# Patient Record
Sex: Female | Born: 1935
Health system: Southern US, Community
[De-identification: ages and names within clinical notes are randomized; demographics above are authoritative.]

## PROBLEM LIST (undated history)

## (undated) DIAGNOSIS — Z8719 Personal history of other diseases of the digestive system: Secondary | ICD-10-CM

## (undated) DIAGNOSIS — F329 Major depressive disorder, single episode, unspecified: Secondary | ICD-10-CM

## (undated) DIAGNOSIS — M1990 Unspecified osteoarthritis, unspecified site: Secondary | ICD-10-CM

## (undated) DIAGNOSIS — R0600 Dyspnea, unspecified: Secondary | ICD-10-CM

## (undated) DIAGNOSIS — F32A Depression, unspecified: Secondary | ICD-10-CM

## (undated) DIAGNOSIS — K5792 Diverticulitis of intestine, part unspecified, without perforation or abscess without bleeding: Secondary | ICD-10-CM

## (undated) DIAGNOSIS — N289 Disorder of kidney and ureter, unspecified: Secondary | ICD-10-CM

## (undated) DIAGNOSIS — K219 Gastro-esophageal reflux disease without esophagitis: Secondary | ICD-10-CM

## (undated) DIAGNOSIS — J189 Pneumonia, unspecified organism: Secondary | ICD-10-CM

## (undated) DIAGNOSIS — C9030 Solitary plasmacytoma not having achieved remission: Secondary | ICD-10-CM

## (undated) DIAGNOSIS — I340 Nonrheumatic mitral (valve) insufficiency: Secondary | ICD-10-CM

## (undated) DIAGNOSIS — E785 Hyperlipidemia, unspecified: Secondary | ICD-10-CM

## (undated) DIAGNOSIS — I82409 Acute embolism and thrombosis of unspecified deep veins of unspecified lower extremity: Secondary | ICD-10-CM

## (undated) DIAGNOSIS — I10 Essential (primary) hypertension: Secondary | ICD-10-CM

## (undated) DIAGNOSIS — G709 Myoneural disorder, unspecified: Secondary | ICD-10-CM

## (undated) DIAGNOSIS — Z8489 Family history of other specified conditions: Secondary | ICD-10-CM

## (undated) HISTORY — PX: CERVICAL SPINE SURGERY: SHX589

## (undated) HISTORY — PX: ABDOMINAL SURGERY: SHX537

## (undated) HISTORY — PX: ABDOMINAL HYSTERECTOMY: SHX81

## (undated) HISTORY — PX: CATARACT EXTRACTION, BILATERAL: SHX1313

## (undated) HISTORY — PX: HERNIA REPAIR: SHX51

---

## 1972-12-23 HISTORY — PX: BREAST BIOPSY: SHX20

## 1988-12-23 HISTORY — PX: OTHER SURGICAL HISTORY: SHX169

## 2003-12-24 HISTORY — PX: OTHER SURGICAL HISTORY: SHX169

## 2006-09-11 ENCOUNTER — Ambulatory Visit: Payer: Self-pay | Admitting: Surgery

## 2010-06-15 ENCOUNTER — Ambulatory Visit: Payer: Self-pay | Admitting: Internal Medicine

## 2010-06-22 ENCOUNTER — Ambulatory Visit: Payer: Self-pay | Admitting: Internal Medicine

## 2010-06-27 ENCOUNTER — Ambulatory Visit: Payer: Self-pay | Admitting: Unknown Physician Specialty

## 2010-07-02 ENCOUNTER — Emergency Department: Payer: Self-pay | Admitting: Emergency Medicine

## 2010-07-04 ENCOUNTER — Ambulatory Visit: Payer: Self-pay | Admitting: Internal Medicine

## 2010-07-06 LAB — CEA: CEA: 1.4 ng/mL (ref 0.0–4.7)

## 2010-07-06 LAB — CA 125: CA 125: 58.2 U/mL — ABNORMAL HIGH (ref 0.0–34.0)

## 2010-07-23 ENCOUNTER — Ambulatory Visit: Payer: Self-pay | Admitting: Internal Medicine

## 2010-08-23 ENCOUNTER — Ambulatory Visit: Payer: Self-pay | Admitting: Internal Medicine

## 2010-09-03 LAB — CA 125: CA 125: 50.8 U/mL — ABNORMAL HIGH (ref 0.0–34.0)

## 2010-09-22 ENCOUNTER — Ambulatory Visit: Payer: Self-pay | Admitting: Internal Medicine

## 2010-10-23 ENCOUNTER — Ambulatory Visit: Payer: Self-pay | Admitting: Internal Medicine

## 2010-11-22 ENCOUNTER — Ambulatory Visit: Payer: Self-pay | Admitting: Internal Medicine

## 2010-11-28 ENCOUNTER — Ambulatory Visit: Payer: Self-pay | Admitting: Internal Medicine

## 2010-12-23 ENCOUNTER — Ambulatory Visit: Payer: Self-pay | Admitting: Internal Medicine

## 2011-01-23 ENCOUNTER — Ambulatory Visit: Payer: Self-pay | Admitting: Internal Medicine

## 2011-02-21 ENCOUNTER — Ambulatory Visit: Payer: Self-pay | Admitting: Internal Medicine

## 2011-04-02 ENCOUNTER — Ambulatory Visit: Payer: Self-pay | Admitting: Internal Medicine

## 2011-04-05 ENCOUNTER — Ambulatory Visit: Payer: Self-pay | Admitting: General Practice

## 2011-04-19 ENCOUNTER — Ambulatory Visit: Payer: Self-pay | Admitting: General Practice

## 2011-04-23 ENCOUNTER — Ambulatory Visit: Payer: Self-pay | Admitting: Internal Medicine

## 2011-06-14 ENCOUNTER — Ambulatory Visit: Payer: Self-pay | Admitting: General Practice

## 2011-06-17 LAB — PATHOLOGY REPORT

## 2012-01-02 DIAGNOSIS — I4891 Unspecified atrial fibrillation: Secondary | ICD-10-CM | POA: Diagnosis not present

## 2012-01-09 DIAGNOSIS — M6740 Ganglion, unspecified site: Secondary | ICD-10-CM | POA: Diagnosis not present

## 2012-01-09 DIAGNOSIS — M19049 Primary osteoarthritis, unspecified hand: Secondary | ICD-10-CM | POA: Diagnosis not present

## 2012-01-21 DIAGNOSIS — N6019 Diffuse cystic mastopathy of unspecified breast: Secondary | ICD-10-CM | POA: Diagnosis not present

## 2012-01-27 DIAGNOSIS — N63 Unspecified lump in unspecified breast: Secondary | ICD-10-CM | POA: Diagnosis not present

## 2012-01-27 DIAGNOSIS — N6489 Other specified disorders of breast: Secondary | ICD-10-CM | POA: Diagnosis not present

## 2012-01-30 DIAGNOSIS — I4891 Unspecified atrial fibrillation: Secondary | ICD-10-CM | POA: Diagnosis not present

## 2012-01-30 DIAGNOSIS — E782 Mixed hyperlipidemia: Secondary | ICD-10-CM | POA: Diagnosis not present

## 2012-02-04 DIAGNOSIS — L738 Other specified follicular disorders: Secondary | ICD-10-CM | POA: Diagnosis not present

## 2012-02-04 DIAGNOSIS — L821 Other seborrheic keratosis: Secondary | ICD-10-CM | POA: Diagnosis not present

## 2012-02-04 DIAGNOSIS — M7130 Other bursal cyst, unspecified site: Secondary | ICD-10-CM | POA: Diagnosis not present

## 2012-02-04 DIAGNOSIS — L570 Actinic keratosis: Secondary | ICD-10-CM | POA: Diagnosis not present

## 2012-02-17 DIAGNOSIS — I4891 Unspecified atrial fibrillation: Secondary | ICD-10-CM | POA: Diagnosis not present

## 2012-02-17 DIAGNOSIS — E782 Mixed hyperlipidemia: Secondary | ICD-10-CM | POA: Diagnosis not present

## 2012-03-10 DIAGNOSIS — M7130 Other bursal cyst, unspecified site: Secondary | ICD-10-CM | POA: Diagnosis not present

## 2012-03-10 DIAGNOSIS — L738 Other specified follicular disorders: Secondary | ICD-10-CM | POA: Diagnosis not present

## 2012-03-10 DIAGNOSIS — L280 Lichen simplex chronicus: Secondary | ICD-10-CM | POA: Diagnosis not present

## 2012-03-16 DIAGNOSIS — I4891 Unspecified atrial fibrillation: Secondary | ICD-10-CM | POA: Diagnosis not present

## 2012-04-13 DIAGNOSIS — I4891 Unspecified atrial fibrillation: Secondary | ICD-10-CM | POA: Diagnosis not present

## 2012-05-11 DIAGNOSIS — I4891 Unspecified atrial fibrillation: Secondary | ICD-10-CM | POA: Diagnosis not present

## 2012-05-27 DIAGNOSIS — C9030 Solitary plasmacytoma not having achieved remission: Secondary | ICD-10-CM | POA: Diagnosis not present

## 2012-05-27 DIAGNOSIS — D480 Neoplasm of uncertain behavior of bone and articular cartilage: Secondary | ICD-10-CM | POA: Diagnosis not present

## 2012-06-08 DIAGNOSIS — I4891 Unspecified atrial fibrillation: Secondary | ICD-10-CM | POA: Diagnosis not present

## 2012-06-16 DIAGNOSIS — I4891 Unspecified atrial fibrillation: Secondary | ICD-10-CM | POA: Diagnosis not present

## 2012-06-22 DIAGNOSIS — L819 Disorder of pigmentation, unspecified: Secondary | ICD-10-CM | POA: Diagnosis not present

## 2012-06-22 DIAGNOSIS — L280 Lichen simplex chronicus: Secondary | ICD-10-CM | POA: Diagnosis not present

## 2012-06-22 DIAGNOSIS — L578 Other skin changes due to chronic exposure to nonionizing radiation: Secondary | ICD-10-CM | POA: Diagnosis not present

## 2012-06-22 DIAGNOSIS — L570 Actinic keratosis: Secondary | ICD-10-CM | POA: Diagnosis not present

## 2012-06-22 DIAGNOSIS — L738 Other specified follicular disorders: Secondary | ICD-10-CM | POA: Diagnosis not present

## 2012-06-24 DIAGNOSIS — I4891 Unspecified atrial fibrillation: Secondary | ICD-10-CM | POA: Diagnosis not present

## 2012-07-02 DIAGNOSIS — I4891 Unspecified atrial fibrillation: Secondary | ICD-10-CM | POA: Diagnosis not present

## 2012-07-30 DIAGNOSIS — I4891 Unspecified atrial fibrillation: Secondary | ICD-10-CM | POA: Diagnosis not present

## 2012-08-04 DIAGNOSIS — Z961 Presence of intraocular lens: Secondary | ICD-10-CM | POA: Diagnosis not present

## 2012-08-04 DIAGNOSIS — H52229 Regular astigmatism, unspecified eye: Secondary | ICD-10-CM | POA: Diagnosis not present

## 2012-08-04 DIAGNOSIS — H5210 Myopia, unspecified eye: Secondary | ICD-10-CM | POA: Diagnosis not present

## 2012-08-04 DIAGNOSIS — H353190 Nonexudative age-related macular degeneration, unspecified eye, stage unspecified: Secondary | ICD-10-CM | POA: Diagnosis not present

## 2012-08-13 DIAGNOSIS — E785 Hyperlipidemia, unspecified: Secondary | ICD-10-CM | POA: Diagnosis not present

## 2012-08-13 DIAGNOSIS — I4891 Unspecified atrial fibrillation: Secondary | ICD-10-CM | POA: Diagnosis not present

## 2012-08-26 DIAGNOSIS — L905 Scar conditions and fibrosis of skin: Secondary | ICD-10-CM | POA: Diagnosis not present

## 2012-08-26 DIAGNOSIS — L819 Disorder of pigmentation, unspecified: Secondary | ICD-10-CM | POA: Diagnosis not present

## 2012-08-26 DIAGNOSIS — D485 Neoplasm of uncertain behavior of skin: Secondary | ICD-10-CM | POA: Diagnosis not present

## 2012-08-28 DIAGNOSIS — S8000XA Contusion of unspecified knee, initial encounter: Secondary | ICD-10-CM | POA: Diagnosis not present

## 2012-08-28 DIAGNOSIS — IMO0002 Reserved for concepts with insufficient information to code with codable children: Secondary | ICD-10-CM | POA: Diagnosis not present

## 2012-08-28 DIAGNOSIS — S8990XA Unspecified injury of unspecified lower leg, initial encounter: Secondary | ICD-10-CM | POA: Diagnosis not present

## 2012-08-28 DIAGNOSIS — M25569 Pain in unspecified knee: Secondary | ICD-10-CM | POA: Diagnosis not present

## 2012-09-08 DIAGNOSIS — S8010XA Contusion of unspecified lower leg, initial encounter: Secondary | ICD-10-CM | POA: Diagnosis not present

## 2012-09-08 DIAGNOSIS — S8000XA Contusion of unspecified knee, initial encounter: Secondary | ICD-10-CM | POA: Diagnosis not present

## 2012-09-08 DIAGNOSIS — S93419A Sprain of calcaneofibular ligament of unspecified ankle, initial encounter: Secondary | ICD-10-CM | POA: Diagnosis not present

## 2012-09-10 DIAGNOSIS — I4891 Unspecified atrial fibrillation: Secondary | ICD-10-CM | POA: Diagnosis not present

## 2012-09-29 DIAGNOSIS — S93419A Sprain of calcaneofibular ligament of unspecified ankle, initial encounter: Secondary | ICD-10-CM | POA: Diagnosis not present

## 2012-09-29 DIAGNOSIS — S8010XA Contusion of unspecified lower leg, initial encounter: Secondary | ICD-10-CM | POA: Diagnosis not present

## 2012-09-29 DIAGNOSIS — S8000XA Contusion of unspecified knee, initial encounter: Secondary | ICD-10-CM | POA: Diagnosis not present

## 2012-10-01 DIAGNOSIS — H43819 Vitreous degeneration, unspecified eye: Secondary | ICD-10-CM | POA: Diagnosis not present

## 2012-10-01 DIAGNOSIS — H52229 Regular astigmatism, unspecified eye: Secondary | ICD-10-CM | POA: Diagnosis not present

## 2012-10-01 DIAGNOSIS — H524 Presbyopia: Secondary | ICD-10-CM | POA: Diagnosis not present

## 2012-10-08 DIAGNOSIS — I4891 Unspecified atrial fibrillation: Secondary | ICD-10-CM | POA: Diagnosis not present

## 2012-10-08 DIAGNOSIS — E785 Hyperlipidemia, unspecified: Secondary | ICD-10-CM | POA: Diagnosis not present

## 2012-10-28 DIAGNOSIS — B351 Tinea unguium: Secondary | ICD-10-CM | POA: Diagnosis not present

## 2012-10-28 DIAGNOSIS — M79609 Pain in unspecified limb: Secondary | ICD-10-CM | POA: Diagnosis not present

## 2012-10-28 DIAGNOSIS — L851 Acquired keratosis [keratoderma] palmaris et plantaris: Secondary | ICD-10-CM | POA: Diagnosis not present

## 2012-11-03 DIAGNOSIS — S93419A Sprain of calcaneofibular ligament of unspecified ankle, initial encounter: Secondary | ICD-10-CM | POA: Diagnosis not present

## 2012-11-03 DIAGNOSIS — S8000XA Contusion of unspecified knee, initial encounter: Secondary | ICD-10-CM | POA: Diagnosis not present

## 2012-11-05 DIAGNOSIS — I4891 Unspecified atrial fibrillation: Secondary | ICD-10-CM | POA: Diagnosis not present

## 2012-12-03 DIAGNOSIS — I4891 Unspecified atrial fibrillation: Secondary | ICD-10-CM | POA: Diagnosis not present

## 2013-01-01 DIAGNOSIS — Z961 Presence of intraocular lens: Secondary | ICD-10-CM | POA: Diagnosis not present

## 2013-01-01 DIAGNOSIS — H5210 Myopia, unspecified eye: Secondary | ICD-10-CM | POA: Diagnosis not present

## 2013-01-01 DIAGNOSIS — H4300 Vitreous prolapse, unspecified eye: Secondary | ICD-10-CM | POA: Diagnosis not present

## 2013-01-01 DIAGNOSIS — H40009 Preglaucoma, unspecified, unspecified eye: Secondary | ICD-10-CM | POA: Diagnosis not present

## 2013-01-06 DIAGNOSIS — I4891 Unspecified atrial fibrillation: Secondary | ICD-10-CM | POA: Diagnosis not present

## 2013-01-06 DIAGNOSIS — E782 Mixed hyperlipidemia: Secondary | ICD-10-CM | POA: Diagnosis not present

## 2013-01-18 DIAGNOSIS — I4891 Unspecified atrial fibrillation: Secondary | ICD-10-CM | POA: Diagnosis not present

## 2013-02-09 DIAGNOSIS — K219 Gastro-esophageal reflux disease without esophagitis: Secondary | ICD-10-CM | POA: Diagnosis not present

## 2013-02-09 DIAGNOSIS — E559 Vitamin D deficiency, unspecified: Secondary | ICD-10-CM | POA: Diagnosis not present

## 2013-02-09 DIAGNOSIS — Z79899 Other long term (current) drug therapy: Secondary | ICD-10-CM | POA: Diagnosis not present

## 2013-02-09 DIAGNOSIS — C9030 Solitary plasmacytoma not having achieved remission: Secondary | ICD-10-CM | POA: Diagnosis not present

## 2013-02-09 DIAGNOSIS — R413 Other amnesia: Secondary | ICD-10-CM | POA: Diagnosis not present

## 2013-02-09 DIAGNOSIS — I749 Embolism and thrombosis of unspecified artery: Secondary | ICD-10-CM | POA: Diagnosis not present

## 2013-02-09 DIAGNOSIS — M159 Polyosteoarthritis, unspecified: Secondary | ICD-10-CM | POA: Diagnosis not present

## 2013-02-09 DIAGNOSIS — I10 Essential (primary) hypertension: Secondary | ICD-10-CM | POA: Diagnosis not present

## 2013-02-09 DIAGNOSIS — Z0000 Encounter for general adult medical examination without abnormal findings: Secondary | ICD-10-CM | POA: Diagnosis not present

## 2013-02-15 DIAGNOSIS — I4891 Unspecified atrial fibrillation: Secondary | ICD-10-CM | POA: Diagnosis not present

## 2013-03-08 DIAGNOSIS — I4891 Unspecified atrial fibrillation: Secondary | ICD-10-CM | POA: Diagnosis not present

## 2013-03-23 DIAGNOSIS — I4891 Unspecified atrial fibrillation: Secondary | ICD-10-CM | POA: Diagnosis not present

## 2013-04-06 DIAGNOSIS — I4891 Unspecified atrial fibrillation: Secondary | ICD-10-CM | POA: Diagnosis not present

## 2013-04-14 DIAGNOSIS — Z1231 Encounter for screening mammogram for malignant neoplasm of breast: Secondary | ICD-10-CM | POA: Diagnosis not present

## 2013-04-14 DIAGNOSIS — N6489 Other specified disorders of breast: Secondary | ICD-10-CM | POA: Diagnosis not present

## 2013-04-15 DIAGNOSIS — N6489 Other specified disorders of breast: Secondary | ICD-10-CM | POA: Diagnosis not present

## 2013-04-15 DIAGNOSIS — R928 Other abnormal and inconclusive findings on diagnostic imaging of breast: Secondary | ICD-10-CM | POA: Diagnosis not present

## 2013-04-27 DIAGNOSIS — I4891 Unspecified atrial fibrillation: Secondary | ICD-10-CM | POA: Diagnosis not present

## 2013-05-25 DIAGNOSIS — I4891 Unspecified atrial fibrillation: Secondary | ICD-10-CM | POA: Diagnosis not present

## 2013-05-28 DIAGNOSIS — C9030 Solitary plasmacytoma not having achieved remission: Secondary | ICD-10-CM | POA: Insufficient documentation

## 2013-05-28 DIAGNOSIS — D489 Neoplasm of uncertain behavior, unspecified: Secondary | ICD-10-CM | POA: Diagnosis not present

## 2013-06-15 DIAGNOSIS — I4891 Unspecified atrial fibrillation: Secondary | ICD-10-CM | POA: Diagnosis not present

## 2013-07-13 DIAGNOSIS — E785 Hyperlipidemia, unspecified: Secondary | ICD-10-CM | POA: Diagnosis not present

## 2013-07-13 DIAGNOSIS — I4891 Unspecified atrial fibrillation: Secondary | ICD-10-CM | POA: Diagnosis not present

## 2013-07-27 DIAGNOSIS — I4891 Unspecified atrial fibrillation: Secondary | ICD-10-CM | POA: Diagnosis not present

## 2013-08-03 DIAGNOSIS — H3560 Retinal hemorrhage, unspecified eye: Secondary | ICD-10-CM | POA: Diagnosis not present

## 2013-08-03 DIAGNOSIS — E7800 Pure hypercholesterolemia, unspecified: Secondary | ICD-10-CM | POA: Diagnosis not present

## 2013-08-03 DIAGNOSIS — H4300 Vitreous prolapse, unspecified eye: Secondary | ICD-10-CM | POA: Diagnosis not present

## 2013-08-03 DIAGNOSIS — Z961 Presence of intraocular lens: Secondary | ICD-10-CM | POA: Diagnosis not present

## 2013-08-09 DIAGNOSIS — R110 Nausea: Secondary | ICD-10-CM | POA: Diagnosis not present

## 2013-08-24 DIAGNOSIS — I4891 Unspecified atrial fibrillation: Secondary | ICD-10-CM | POA: Diagnosis not present

## 2013-09-06 DIAGNOSIS — E783 Hyperchylomicronemia: Secondary | ICD-10-CM | POA: Diagnosis not present

## 2013-09-06 DIAGNOSIS — I10 Essential (primary) hypertension: Secondary | ICD-10-CM | POA: Diagnosis not present

## 2013-09-06 DIAGNOSIS — I2510 Atherosclerotic heart disease of native coronary artery without angina pectoris: Secondary | ICD-10-CM | POA: Diagnosis not present

## 2013-09-06 DIAGNOSIS — R079 Chest pain, unspecified: Secondary | ICD-10-CM | POA: Diagnosis not present

## 2013-09-13 DIAGNOSIS — Z23 Encounter for immunization: Secondary | ICD-10-CM | POA: Diagnosis not present

## 2013-09-15 ENCOUNTER — Ambulatory Visit: Payer: Self-pay | Admitting: Internal Medicine

## 2013-09-15 DIAGNOSIS — R110 Nausea: Secondary | ICD-10-CM | POA: Diagnosis not present

## 2013-09-15 DIAGNOSIS — Z9089 Acquired absence of other organs: Secondary | ICD-10-CM | POA: Diagnosis not present

## 2013-09-15 DIAGNOSIS — K838 Other specified diseases of biliary tract: Secondary | ICD-10-CM | POA: Diagnosis not present

## 2013-09-15 DIAGNOSIS — R109 Unspecified abdominal pain: Secondary | ICD-10-CM | POA: Diagnosis not present

## 2013-09-23 DIAGNOSIS — R110 Nausea: Secondary | ICD-10-CM | POA: Diagnosis not present

## 2013-09-23 DIAGNOSIS — R933 Abnormal findings on diagnostic imaging of other parts of digestive tract: Secondary | ICD-10-CM | POA: Diagnosis not present

## 2013-09-23 DIAGNOSIS — I4891 Unspecified atrial fibrillation: Secondary | ICD-10-CM | POA: Diagnosis not present

## 2013-09-23 DIAGNOSIS — R1084 Generalized abdominal pain: Secondary | ICD-10-CM | POA: Diagnosis not present

## 2013-10-21 DIAGNOSIS — I4891 Unspecified atrial fibrillation: Secondary | ICD-10-CM | POA: Diagnosis not present

## 2013-11-02 DIAGNOSIS — H4300 Vitreous prolapse, unspecified eye: Secondary | ICD-10-CM | POA: Diagnosis not present

## 2013-11-02 DIAGNOSIS — Z961 Presence of intraocular lens: Secondary | ICD-10-CM | POA: Diagnosis not present

## 2013-11-02 DIAGNOSIS — H04129 Dry eye syndrome of unspecified lacrimal gland: Secondary | ICD-10-CM | POA: Diagnosis not present

## 2013-11-02 DIAGNOSIS — E7800 Pure hypercholesterolemia, unspecified: Secondary | ICD-10-CM | POA: Diagnosis not present

## 2013-11-11 ENCOUNTER — Encounter (INDEPENDENT_AMBULATORY_CARE_PROVIDER_SITE_OTHER): Payer: Medicare Other | Admitting: Ophthalmology

## 2013-11-11 DIAGNOSIS — H43819 Vitreous degeneration, unspecified eye: Secondary | ICD-10-CM

## 2013-11-11 DIAGNOSIS — I10 Essential (primary) hypertension: Secondary | ICD-10-CM

## 2013-11-11 DIAGNOSIS — H35039 Hypertensive retinopathy, unspecified eye: Secondary | ICD-10-CM

## 2013-11-11 DIAGNOSIS — H35059 Retinal neovascularization, unspecified, unspecified eye: Secondary | ICD-10-CM

## 2013-11-11 DIAGNOSIS — H3530 Unspecified macular degeneration: Secondary | ICD-10-CM

## 2013-11-16 DIAGNOSIS — I4891 Unspecified atrial fibrillation: Secondary | ICD-10-CM | POA: Diagnosis not present

## 2013-11-25 ENCOUNTER — Ambulatory Visit (INDEPENDENT_AMBULATORY_CARE_PROVIDER_SITE_OTHER): Payer: Medicare Other | Admitting: Ophthalmology

## 2013-11-25 DIAGNOSIS — H35059 Retinal neovascularization, unspecified, unspecified eye: Secondary | ICD-10-CM

## 2013-12-03 DIAGNOSIS — S301XXA Contusion of abdominal wall, initial encounter: Secondary | ICD-10-CM | POA: Diagnosis not present

## 2013-12-03 DIAGNOSIS — Z7901 Long term (current) use of anticoagulants: Secondary | ICD-10-CM | POA: Diagnosis not present

## 2013-12-18 DIAGNOSIS — J111 Influenza due to unidentified influenza virus with other respiratory manifestations: Secondary | ICD-10-CM | POA: Diagnosis not present

## 2013-12-22 DIAGNOSIS — J40 Bronchitis, not specified as acute or chronic: Secondary | ICD-10-CM | POA: Diagnosis not present

## 2013-12-27 DIAGNOSIS — I4891 Unspecified atrial fibrillation: Secondary | ICD-10-CM | POA: Diagnosis not present

## 2014-01-24 DIAGNOSIS — I4891 Unspecified atrial fibrillation: Secondary | ICD-10-CM | POA: Diagnosis not present

## 2014-01-26 ENCOUNTER — Encounter (INDEPENDENT_AMBULATORY_CARE_PROVIDER_SITE_OTHER): Payer: Medicare Other | Admitting: Ophthalmology

## 2014-01-26 DIAGNOSIS — H35059 Retinal neovascularization, unspecified, unspecified eye: Secondary | ICD-10-CM

## 2014-02-18 ENCOUNTER — Ambulatory Visit: Payer: Self-pay | Admitting: Internal Medicine

## 2014-02-18 DIAGNOSIS — R059 Cough, unspecified: Secondary | ICD-10-CM | POA: Diagnosis not present

## 2014-02-18 DIAGNOSIS — K219 Gastro-esophageal reflux disease without esophagitis: Secondary | ICD-10-CM | POA: Diagnosis not present

## 2014-02-18 DIAGNOSIS — I749 Embolism and thrombosis of unspecified artery: Secondary | ICD-10-CM | POA: Diagnosis not present

## 2014-02-18 DIAGNOSIS — R05 Cough: Secondary | ICD-10-CM | POA: Diagnosis not present

## 2014-02-18 DIAGNOSIS — K449 Diaphragmatic hernia without obstruction or gangrene: Secondary | ICD-10-CM | POA: Diagnosis not present

## 2014-02-23 DIAGNOSIS — Z79899 Other long term (current) drug therapy: Secondary | ICD-10-CM | POA: Diagnosis not present

## 2014-02-23 DIAGNOSIS — I4891 Unspecified atrial fibrillation: Secondary | ICD-10-CM | POA: Diagnosis not present

## 2014-03-01 ENCOUNTER — Ambulatory Visit: Payer: Self-pay | Admitting: Internal Medicine

## 2014-03-01 DIAGNOSIS — R05 Cough: Secondary | ICD-10-CM | POA: Diagnosis not present

## 2014-03-01 DIAGNOSIS — R059 Cough, unspecified: Secondary | ICD-10-CM | POA: Diagnosis not present

## 2014-03-01 DIAGNOSIS — I499 Cardiac arrhythmia, unspecified: Secondary | ICD-10-CM | POA: Diagnosis not present

## 2014-03-01 DIAGNOSIS — R079 Chest pain, unspecified: Secondary | ICD-10-CM | POA: Diagnosis not present

## 2014-03-07 DIAGNOSIS — R6889 Other general symptoms and signs: Secondary | ICD-10-CM | POA: Diagnosis not present

## 2014-03-07 DIAGNOSIS — K219 Gastro-esophageal reflux disease without esophagitis: Secondary | ICD-10-CM | POA: Diagnosis not present

## 2014-03-11 DIAGNOSIS — E782 Mixed hyperlipidemia: Secondary | ICD-10-CM | POA: Diagnosis not present

## 2014-03-11 DIAGNOSIS — I10 Essential (primary) hypertension: Secondary | ICD-10-CM | POA: Diagnosis not present

## 2014-03-21 DIAGNOSIS — R748 Abnormal levels of other serum enzymes: Secondary | ICD-10-CM | POA: Diagnosis not present

## 2014-03-21 DIAGNOSIS — R110 Nausea: Secondary | ICD-10-CM | POA: Diagnosis not present

## 2014-03-21 DIAGNOSIS — R7402 Elevation of levels of lactic acid dehydrogenase (LDH): Secondary | ICD-10-CM | POA: Diagnosis not present

## 2014-03-21 DIAGNOSIS — R7401 Elevation of levels of liver transaminase levels: Secondary | ICD-10-CM | POA: Diagnosis not present

## 2014-03-29 ENCOUNTER — Ambulatory Visit: Payer: Self-pay | Admitting: Unknown Physician Specialty

## 2014-03-29 DIAGNOSIS — M519 Unspecified thoracic, thoracolumbar and lumbosacral intervertebral disc disorder: Secondary | ICD-10-CM | POA: Diagnosis not present

## 2014-03-29 DIAGNOSIS — K449 Diaphragmatic hernia without obstruction or gangrene: Secondary | ICD-10-CM | POA: Diagnosis not present

## 2014-03-29 DIAGNOSIS — I7090 Unspecified atherosclerosis: Secondary | ICD-10-CM | POA: Diagnosis not present

## 2014-03-29 DIAGNOSIS — R110 Nausea: Secondary | ICD-10-CM | POA: Diagnosis not present

## 2014-03-29 DIAGNOSIS — R1013 Epigastric pain: Secondary | ICD-10-CM | POA: Diagnosis not present

## 2014-03-29 DIAGNOSIS — K5730 Diverticulosis of large intestine without perforation or abscess without bleeding: Secondary | ICD-10-CM | POA: Diagnosis not present

## 2014-04-25 ENCOUNTER — Encounter (INDEPENDENT_AMBULATORY_CARE_PROVIDER_SITE_OTHER): Payer: Medicare Other | Admitting: Ophthalmology

## 2014-04-25 DIAGNOSIS — I10 Essential (primary) hypertension: Secondary | ICD-10-CM | POA: Diagnosis not present

## 2014-04-25 DIAGNOSIS — H35039 Hypertensive retinopathy, unspecified eye: Secondary | ICD-10-CM | POA: Diagnosis not present

## 2014-04-25 DIAGNOSIS — H3530 Unspecified macular degeneration: Secondary | ICD-10-CM | POA: Diagnosis not present

## 2014-04-25 DIAGNOSIS — H35059 Retinal neovascularization, unspecified, unspecified eye: Secondary | ICD-10-CM

## 2014-04-25 DIAGNOSIS — H43819 Vitreous degeneration, unspecified eye: Secondary | ICD-10-CM

## 2014-05-27 DIAGNOSIS — C9030 Solitary plasmacytoma not having achieved remission: Secondary | ICD-10-CM | POA: Diagnosis not present

## 2014-06-08 DIAGNOSIS — I10 Essential (primary) hypertension: Secondary | ICD-10-CM | POA: Insufficient documentation

## 2014-06-08 DIAGNOSIS — I2510 Atherosclerotic heart disease of native coronary artery without angina pectoris: Secondary | ICD-10-CM | POA: Insufficient documentation

## 2014-06-08 DIAGNOSIS — I059 Rheumatic mitral valve disease, unspecified: Secondary | ICD-10-CM | POA: Diagnosis not present

## 2014-06-08 DIAGNOSIS — I340 Nonrheumatic mitral (valve) insufficiency: Secondary | ICD-10-CM | POA: Insufficient documentation

## 2014-06-08 DIAGNOSIS — E782 Mixed hyperlipidemia: Secondary | ICD-10-CM | POA: Insufficient documentation

## 2014-06-08 DIAGNOSIS — IMO0002 Reserved for concepts with insufficient information to code with codable children: Secondary | ICD-10-CM | POA: Diagnosis not present

## 2014-06-17 DIAGNOSIS — I059 Rheumatic mitral valve disease, unspecified: Secondary | ICD-10-CM | POA: Diagnosis not present

## 2014-06-17 DIAGNOSIS — R0602 Shortness of breath: Secondary | ICD-10-CM | POA: Diagnosis not present

## 2014-07-15 DIAGNOSIS — R209 Unspecified disturbances of skin sensation: Secondary | ICD-10-CM | POA: Diagnosis not present

## 2014-07-15 DIAGNOSIS — M79609 Pain in unspecified limb: Secondary | ICD-10-CM | POA: Diagnosis not present

## 2014-07-15 DIAGNOSIS — M542 Cervicalgia: Secondary | ICD-10-CM | POA: Diagnosis not present

## 2014-07-15 DIAGNOSIS — R292 Abnormal reflex: Secondary | ICD-10-CM | POA: Diagnosis not present

## 2014-08-01 DIAGNOSIS — H353290 Exudative age-related macular degeneration, unspecified eye, stage unspecified: Secondary | ICD-10-CM | POA: Diagnosis not present

## 2014-08-03 ENCOUNTER — Ambulatory Visit: Payer: Self-pay | Admitting: Neurology

## 2014-08-03 DIAGNOSIS — M47812 Spondylosis without myelopathy or radiculopathy, cervical region: Secondary | ICD-10-CM | POA: Diagnosis not present

## 2014-08-03 DIAGNOSIS — M5030 Other cervical disc degeneration, unspecified cervical region: Secondary | ICD-10-CM | POA: Diagnosis not present

## 2014-08-03 DIAGNOSIS — R292 Abnormal reflex: Secondary | ICD-10-CM | POA: Diagnosis not present

## 2014-08-03 DIAGNOSIS — M542 Cervicalgia: Secondary | ICD-10-CM | POA: Diagnosis not present

## 2014-08-08 DIAGNOSIS — D0470 Carcinoma in situ of skin of unspecified lower limb, including hip: Secondary | ICD-10-CM | POA: Diagnosis not present

## 2014-08-08 DIAGNOSIS — L819 Disorder of pigmentation, unspecified: Secondary | ICD-10-CM | POA: Diagnosis not present

## 2014-08-08 DIAGNOSIS — L570 Actinic keratosis: Secondary | ICD-10-CM | POA: Diagnosis not present

## 2014-08-08 DIAGNOSIS — L578 Other skin changes due to chronic exposure to nonionizing radiation: Secondary | ICD-10-CM | POA: Diagnosis not present

## 2014-08-08 DIAGNOSIS — G609 Hereditary and idiopathic neuropathy, unspecified: Secondary | ICD-10-CM | POA: Diagnosis not present

## 2014-08-08 DIAGNOSIS — D485 Neoplasm of uncertain behavior of skin: Secondary | ICD-10-CM | POA: Diagnosis not present

## 2014-08-08 DIAGNOSIS — R209 Unspecified disturbances of skin sensation: Secondary | ICD-10-CM | POA: Diagnosis not present

## 2014-08-08 DIAGNOSIS — D049 Carcinoma in situ of skin, unspecified: Secondary | ICD-10-CM | POA: Diagnosis not present

## 2014-08-09 DIAGNOSIS — H4300 Vitreous prolapse, unspecified eye: Secondary | ICD-10-CM | POA: Diagnosis not present

## 2014-08-09 DIAGNOSIS — Z961 Presence of intraocular lens: Secondary | ICD-10-CM | POA: Diagnosis not present

## 2014-08-09 DIAGNOSIS — H5210 Myopia, unspecified eye: Secondary | ICD-10-CM | POA: Diagnosis not present

## 2014-08-09 DIAGNOSIS — H52229 Regular astigmatism, unspecified eye: Secondary | ICD-10-CM | POA: Diagnosis not present

## 2014-09-08 DIAGNOSIS — I059 Rheumatic mitral valve disease, unspecified: Secondary | ICD-10-CM | POA: Diagnosis not present

## 2014-09-08 DIAGNOSIS — I2510 Atherosclerotic heart disease of native coronary artery without angina pectoris: Secondary | ICD-10-CM | POA: Diagnosis not present

## 2014-09-08 DIAGNOSIS — Z23 Encounter for immunization: Secondary | ICD-10-CM | POA: Diagnosis not present

## 2014-09-08 DIAGNOSIS — E785 Hyperlipidemia, unspecified: Secondary | ICD-10-CM | POA: Diagnosis not present

## 2014-09-08 DIAGNOSIS — I10 Essential (primary) hypertension: Secondary | ICD-10-CM | POA: Diagnosis not present

## 2014-09-12 DIAGNOSIS — L905 Scar conditions and fibrosis of skin: Secondary | ICD-10-CM | POA: Diagnosis not present

## 2014-09-12 DIAGNOSIS — D0470 Carcinoma in situ of skin of unspecified lower limb, including hip: Secondary | ICD-10-CM | POA: Diagnosis not present

## 2014-09-30 DIAGNOSIS — M542 Cervicalgia: Secondary | ICD-10-CM | POA: Diagnosis not present

## 2014-09-30 DIAGNOSIS — R200 Anesthesia of skin: Secondary | ICD-10-CM | POA: Diagnosis not present

## 2014-09-30 DIAGNOSIS — G629 Polyneuropathy, unspecified: Secondary | ICD-10-CM | POA: Diagnosis not present

## 2014-09-30 DIAGNOSIS — M4712 Other spondylosis with myelopathy, cervical region: Secondary | ICD-10-CM | POA: Diagnosis not present

## 2014-10-03 DIAGNOSIS — R1013 Epigastric pain: Secondary | ICD-10-CM | POA: Insufficient documentation

## 2014-10-03 DIAGNOSIS — I10 Essential (primary) hypertension: Secondary | ICD-10-CM | POA: Insufficient documentation

## 2014-10-03 DIAGNOSIS — Z1389 Encounter for screening for other disorder: Secondary | ICD-10-CM | POA: Diagnosis not present

## 2014-10-03 DIAGNOSIS — K219 Gastro-esophageal reflux disease without esophagitis: Secondary | ICD-10-CM | POA: Diagnosis not present

## 2014-12-27 DIAGNOSIS — K449 Diaphragmatic hernia without obstruction or gangrene: Secondary | ICD-10-CM | POA: Diagnosis not present

## 2014-12-27 DIAGNOSIS — R1013 Epigastric pain: Secondary | ICD-10-CM | POA: Diagnosis not present

## 2014-12-27 DIAGNOSIS — G8929 Other chronic pain: Secondary | ICD-10-CM | POA: Diagnosis not present

## 2014-12-27 DIAGNOSIS — K219 Gastro-esophageal reflux disease without esophagitis: Secondary | ICD-10-CM | POA: Diagnosis not present

## 2015-01-26 DIAGNOSIS — R200 Anesthesia of skin: Secondary | ICD-10-CM | POA: Diagnosis not present

## 2015-01-26 DIAGNOSIS — M4712 Other spondylosis with myelopathy, cervical region: Secondary | ICD-10-CM | POA: Diagnosis not present

## 2015-01-26 DIAGNOSIS — G629 Polyneuropathy, unspecified: Secondary | ICD-10-CM | POA: Diagnosis not present

## 2015-03-08 DIAGNOSIS — G629 Polyneuropathy, unspecified: Secondary | ICD-10-CM | POA: Diagnosis not present

## 2015-03-08 DIAGNOSIS — E782 Mixed hyperlipidemia: Secondary | ICD-10-CM | POA: Diagnosis not present

## 2015-03-08 DIAGNOSIS — I340 Nonrheumatic mitral (valve) insufficiency: Secondary | ICD-10-CM | POA: Diagnosis not present

## 2015-03-08 DIAGNOSIS — I10 Essential (primary) hypertension: Secondary | ICD-10-CM | POA: Diagnosis not present

## 2015-03-08 DIAGNOSIS — I2510 Atherosclerotic heart disease of native coronary artery without angina pectoris: Secondary | ICD-10-CM | POA: Diagnosis not present

## 2015-04-28 DIAGNOSIS — Z79899 Other long term (current) drug therapy: Secondary | ICD-10-CM | POA: Diagnosis not present

## 2015-05-03 ENCOUNTER — Ambulatory Visit (INDEPENDENT_AMBULATORY_CARE_PROVIDER_SITE_OTHER): Payer: Medicare Other | Admitting: Ophthalmology

## 2015-05-03 DIAGNOSIS — H3532 Exudative age-related macular degeneration: Secondary | ICD-10-CM

## 2015-05-03 DIAGNOSIS — H3531 Nonexudative age-related macular degeneration: Secondary | ICD-10-CM | POA: Diagnosis not present

## 2015-05-03 DIAGNOSIS — H35033 Hypertensive retinopathy, bilateral: Secondary | ICD-10-CM | POA: Diagnosis not present

## 2015-05-03 DIAGNOSIS — I10 Essential (primary) hypertension: Secondary | ICD-10-CM | POA: Diagnosis not present

## 2015-05-03 DIAGNOSIS — H43813 Vitreous degeneration, bilateral: Secondary | ICD-10-CM | POA: Diagnosis not present

## 2015-05-10 DIAGNOSIS — Z0000 Encounter for general adult medical examination without abnormal findings: Secondary | ICD-10-CM | POA: Diagnosis not present

## 2015-05-31 ENCOUNTER — Encounter (INDEPENDENT_AMBULATORY_CARE_PROVIDER_SITE_OTHER): Payer: Medicare Other | Admitting: Ophthalmology

## 2015-05-31 DIAGNOSIS — H43813 Vitreous degeneration, bilateral: Secondary | ICD-10-CM | POA: Diagnosis not present

## 2015-05-31 DIAGNOSIS — I10 Essential (primary) hypertension: Secondary | ICD-10-CM

## 2015-05-31 DIAGNOSIS — H3532 Exudative age-related macular degeneration: Secondary | ICD-10-CM | POA: Diagnosis not present

## 2015-05-31 DIAGNOSIS — H35033 Hypertensive retinopathy, bilateral: Secondary | ICD-10-CM

## 2015-05-31 DIAGNOSIS — H3531 Nonexudative age-related macular degeneration: Secondary | ICD-10-CM

## 2015-06-28 ENCOUNTER — Encounter (INDEPENDENT_AMBULATORY_CARE_PROVIDER_SITE_OTHER): Payer: Medicare Other | Admitting: Ophthalmology

## 2015-06-28 DIAGNOSIS — H35033 Hypertensive retinopathy, bilateral: Secondary | ICD-10-CM | POA: Diagnosis not present

## 2015-06-28 DIAGNOSIS — H43813 Vitreous degeneration, bilateral: Secondary | ICD-10-CM

## 2015-06-28 DIAGNOSIS — H3532 Exudative age-related macular degeneration: Secondary | ICD-10-CM

## 2015-06-28 DIAGNOSIS — I10 Essential (primary) hypertension: Secondary | ICD-10-CM | POA: Diagnosis not present

## 2015-06-28 DIAGNOSIS — H3531 Nonexudative age-related macular degeneration: Secondary | ICD-10-CM | POA: Diagnosis not present

## 2015-07-04 DIAGNOSIS — C9030 Solitary plasmacytoma not having achieved remission: Secondary | ICD-10-CM | POA: Diagnosis not present

## 2015-07-21 DIAGNOSIS — Z23 Encounter for immunization: Secondary | ICD-10-CM | POA: Diagnosis not present

## 2015-08-02 ENCOUNTER — Encounter (INDEPENDENT_AMBULATORY_CARE_PROVIDER_SITE_OTHER): Payer: Medicare Other | Admitting: Ophthalmology

## 2015-08-02 DIAGNOSIS — I10 Essential (primary) hypertension: Secondary | ICD-10-CM

## 2015-08-02 DIAGNOSIS — H3531 Nonexudative age-related macular degeneration: Secondary | ICD-10-CM | POA: Diagnosis not present

## 2015-08-02 DIAGNOSIS — H43813 Vitreous degeneration, bilateral: Secondary | ICD-10-CM

## 2015-08-02 DIAGNOSIS — H3532 Exudative age-related macular degeneration: Secondary | ICD-10-CM

## 2015-08-02 DIAGNOSIS — H35033 Hypertensive retinopathy, bilateral: Secondary | ICD-10-CM

## 2015-08-25 DIAGNOSIS — G629 Polyneuropathy, unspecified: Secondary | ICD-10-CM | POA: Diagnosis not present

## 2015-09-01 DIAGNOSIS — E782 Mixed hyperlipidemia: Secondary | ICD-10-CM | POA: Diagnosis not present

## 2015-09-06 ENCOUNTER — Encounter (INDEPENDENT_AMBULATORY_CARE_PROVIDER_SITE_OTHER): Payer: Medicare Other | Admitting: Ophthalmology

## 2015-09-06 DIAGNOSIS — H35033 Hypertensive retinopathy, bilateral: Secondary | ICD-10-CM | POA: Diagnosis not present

## 2015-09-06 DIAGNOSIS — H3531 Nonexudative age-related macular degeneration: Secondary | ICD-10-CM

## 2015-09-06 DIAGNOSIS — R0602 Shortness of breath: Secondary | ICD-10-CM | POA: Diagnosis not present

## 2015-09-06 DIAGNOSIS — E782 Mixed hyperlipidemia: Secondary | ICD-10-CM | POA: Diagnosis not present

## 2015-09-06 DIAGNOSIS — H43813 Vitreous degeneration, bilateral: Secondary | ICD-10-CM | POA: Diagnosis not present

## 2015-09-06 DIAGNOSIS — I2510 Atherosclerotic heart disease of native coronary artery without angina pectoris: Secondary | ICD-10-CM | POA: Diagnosis not present

## 2015-09-06 DIAGNOSIS — I10 Essential (primary) hypertension: Secondary | ICD-10-CM | POA: Diagnosis not present

## 2015-09-06 DIAGNOSIS — H3532 Exudative age-related macular degeneration: Secondary | ICD-10-CM | POA: Diagnosis not present

## 2015-09-06 DIAGNOSIS — R0789 Other chest pain: Secondary | ICD-10-CM | POA: Diagnosis not present

## 2015-09-13 DIAGNOSIS — Z23 Encounter for immunization: Secondary | ICD-10-CM | POA: Diagnosis not present

## 2015-09-20 DIAGNOSIS — R0789 Other chest pain: Secondary | ICD-10-CM | POA: Diagnosis not present

## 2015-09-20 DIAGNOSIS — R0602 Shortness of breath: Secondary | ICD-10-CM | POA: Diagnosis not present

## 2015-09-29 DIAGNOSIS — I10 Essential (primary) hypertension: Secondary | ICD-10-CM | POA: Diagnosis not present

## 2015-09-29 DIAGNOSIS — I340 Nonrheumatic mitral (valve) insufficiency: Secondary | ICD-10-CM | POA: Diagnosis not present

## 2015-09-29 DIAGNOSIS — E782 Mixed hyperlipidemia: Secondary | ICD-10-CM | POA: Diagnosis not present

## 2015-09-29 DIAGNOSIS — I2510 Atherosclerotic heart disease of native coronary artery without angina pectoris: Secondary | ICD-10-CM | POA: Diagnosis not present

## 2015-10-11 ENCOUNTER — Encounter (INDEPENDENT_AMBULATORY_CARE_PROVIDER_SITE_OTHER): Payer: Medicare Other | Admitting: Ophthalmology

## 2015-10-13 ENCOUNTER — Encounter (INDEPENDENT_AMBULATORY_CARE_PROVIDER_SITE_OTHER): Payer: Medicare Other | Admitting: Ophthalmology

## 2015-10-13 DIAGNOSIS — H353124 Nonexudative age-related macular degeneration, left eye, advanced atrophic with subfoveal involvement: Secondary | ICD-10-CM

## 2015-10-13 DIAGNOSIS — H353211 Exudative age-related macular degeneration, right eye, with active choroidal neovascularization: Secondary | ICD-10-CM

## 2015-10-13 DIAGNOSIS — H35033 Hypertensive retinopathy, bilateral: Secondary | ICD-10-CM

## 2015-10-13 DIAGNOSIS — I10 Essential (primary) hypertension: Secondary | ICD-10-CM

## 2015-10-13 DIAGNOSIS — H43813 Vitreous degeneration, bilateral: Secondary | ICD-10-CM | POA: Diagnosis not present

## 2015-11-09 ENCOUNTER — Other Ambulatory Visit: Payer: Self-pay | Admitting: Internal Medicine

## 2015-11-09 ENCOUNTER — Encounter: Payer: Self-pay | Admitting: Internal Medicine

## 2015-11-09 DIAGNOSIS — M810 Age-related osteoporosis without current pathological fracture: Secondary | ICD-10-CM | POA: Insufficient documentation

## 2015-11-09 DIAGNOSIS — G9520 Unspecified cord compression: Secondary | ICD-10-CM | POA: Insufficient documentation

## 2015-11-09 DIAGNOSIS — N393 Stress incontinence (female) (male): Secondary | ICD-10-CM | POA: Insufficient documentation

## 2015-11-09 DIAGNOSIS — M1990 Unspecified osteoarthritis, unspecified site: Secondary | ICD-10-CM | POA: Insufficient documentation

## 2015-11-09 DIAGNOSIS — N179 Acute kidney failure, unspecified: Secondary | ICD-10-CM | POA: Insufficient documentation

## 2015-11-14 ENCOUNTER — Ambulatory Visit (INDEPENDENT_AMBULATORY_CARE_PROVIDER_SITE_OTHER): Payer: Medicare Other | Admitting: Internal Medicine

## 2015-11-14 ENCOUNTER — Encounter: Payer: Self-pay | Admitting: Internal Medicine

## 2015-11-14 VITALS — BP 100/60 | HR 80 | Ht 65.0 in | Wt 139.0 lb

## 2015-11-14 DIAGNOSIS — E782 Mixed hyperlipidemia: Secondary | ICD-10-CM

## 2015-11-14 DIAGNOSIS — R233 Spontaneous ecchymoses: Secondary | ICD-10-CM | POA: Diagnosis not present

## 2015-11-14 DIAGNOSIS — N182 Chronic kidney disease, stage 2 (mild): Secondary | ICD-10-CM | POA: Diagnosis not present

## 2015-11-14 DIAGNOSIS — C9030 Solitary plasmacytoma not having achieved remission: Secondary | ICD-10-CM

## 2015-11-14 DIAGNOSIS — G629 Polyneuropathy, unspecified: Secondary | ICD-10-CM

## 2015-11-14 DIAGNOSIS — I10 Essential (primary) hypertension: Secondary | ICD-10-CM | POA: Diagnosis not present

## 2015-11-14 MED ORDER — CYCLOBENZAPRINE HCL 5 MG PO TABS: 5.0000 mg | ORAL_TABLET | Freq: Three times a day (TID) | ORAL | Status: DC | PRN

## 2015-11-14 NOTE — Progress Notes (Signed)
Date:  11/14/2015   Name:  Amy [name]   DOB:  Aug 22, 1929   MRN:  [id number]   Chief Complaint: Hypertension; Hyperlipidemia; and Gastroesophageal Reflux Hypertension This is a chronic problem. The current episode started more than 1 year ago. The problem is unchanged. The problem is controlled. Pertinent negatives include no chest pain, headaches, orthopnea, palpitations, peripheral edema or shortness of breath. There are no associated agents to hypertension. The current treatment provides significant improvement. There are no compliance problems.   Hyperlipidemia This is a chronic problem. The current episode started more than 1 year ago. The problem is controlled. Recent lipid tests were reviewed and are normal. Pertinent negatives include no chest pain or shortness of breath. Current antihyperlipidemic treatment includes ezetimibe. The current treatment provides significant improvement of lipids.  Gastroesophageal Reflux She reports no abdominal pain, no chest pain, no heartburn, no hoarse voice or no wheezing. This is a chronic problem. The problem occurs rarely. The problem has been unchanged. Pertinent negatives include no fatigue.   Patient has not been here for 18 months. She continues under the care of her cardiologist. There's been no recent medication changes made. She also continues to have neuropathy of both feet and is followed by neurology. Most recent medication has been Cymbalta 30 mg which helps significantly. She reports good control of cholesterol on Vytorin. Labs are monitored intermittently by cardiology. She's concerned about a diagnosis of renal insufficiency. She is not aware of any active problems and would like to have that rechecked. She denies the use of chronic Tylenol, Advil, or Aleve.  Review of Systems  Constitutional: Negative for fever, chills and fatigue.  HENT: Negative for hearing loss and hoarse voice.   Eyes: Negative for visual disturbance.    Respiratory: Negative for chest tightness, shortness of breath and wheezing.   Cardiovascular: Negative for chest pain, palpitations, orthopnea and leg swelling.  Gastrointestinal: Negative for heartburn, abdominal pain, diarrhea and constipation.  Genitourinary: Negative for dysuria.  Musculoskeletal: Negative for joint swelling and arthralgias.  Skin: Positive for wound (abrasion right dorsal wrist). Negative for rash.  Neurological: Negative for headaches.       Burning neuropathic pain in feet   Hematological: Bruises/bleeds easily.  Psychiatric/Behavioral: Negative for sleep disturbance and dysphoric mood.    Patient Active Problem List   Diagnosis Date Noted  . Chronic kidney disease 11/09/2015  . Arthritis, degenerative 11/09/2015  . Cervical spinal cord compression ([place]) 11/09/2015  . Osteoporosis, post-menopausal 11/09/2015  . Stress incontinence 11/09/2015  . Polyneuropathy ([place]) 09/30/2014  . CAD in native artery 06/08/2014  . Benign essential HTN 06/08/2014  . Combined fat and carbohydrate induced hyperlipemia 06/08/2014  . MI (mitral incompetence) 06/08/2014  . 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 Authorizing Provider  aspirin EC 81 MG tablet Take 1 tablet by mouth daily.   Yes Historical Provider, MD  cyclobenzaprine (FLEXERIL) 5 MG tablet Take 1 tablet by mouth 3 (three) times daily as needed. 08/15/09  Yes Historical Provider, MD  DULoxetine (CYMBALTA) 30 MG capsule Take 1 capsule by mouth daily. 08/25/15 08/24/16 Yes Historical Provider, MD  losartan (COZAAR) 50 MG tablet Take 1 tablet by mouth daily. 07/21/15  Yes Historical Provider, MD  Multiple Vitamin (MULTI-VITAMINS) TABS Take 1 tablet by mouth daily.   Yes Historical Provider, MD  Omega-3 Fatty Acids (FISH OIL) 1000 MG CAPS Take by mouth.   Yes Historical Provider, MD  ranitidine (ZANTAC) 150 MG tablet Take 1 tablet by mouth 2 (two) times  daily. 12/27/14  Yes Historical Provider, MD  sucralfate (CARAFATE) 1 G tablet Take 1 tablet by mouth 3 (three) times daily.   Yes Historical Provider, MD  triamterene-hydrochlorothiazide (DYAZIDE) 37.5-25 MG capsule Take 1 capsule by mouth daily. 06/08/15  Yes Historical Provider, MD  vitamin E 1000 UNIT capsule Take 1 capsule by mouth daily.   Yes Historical Provider, MD  VYTORIN 10-20 MG tablet  10/23/15  Yes Historical Provider, MD    Allergies  Allergen Reactions  . Doxycycline Hyclate Nausea Only  . Sulfa Antibiotics Rash    Other Reaction: Throat feels funny    Past Surgical History  Procedure Laterality Date  . Plasmacytoma resection  2005    lumbar  . Bladder tack  1990  . Cataract extraction, bilateral    . Breast biopsy      Social History  Substance Use Topics  . Smoking status: Never Smoker   . Smokeless tobacco: None  . Alcohol Use: No    Medication list has been reviewed and updated.   Physical Exam  Constitutional: She is oriented to person, place, and time. She appears well-developed. No distress.  HENT:  Head: Normocephalic and atraumatic.  Eyes: Right eye exhibits no discharge. Left eye exhibits no discharge. No scleral icterus.  Neck: Normal range of motion. Neck supple. No thyromegaly present.  Cardiovascular: Normal rate, regular rhythm and normal heart sounds.   No murmur heard. Pulmonary/Chest: Effort normal and breath sounds normal. No respiratory distress. She has no wheezes.  Musculoskeletal: Normal range of motion. She exhibits no edema or tenderness.  Neurological: She is alert and oriented to person, place, and time.  Skin: Skin is warm and dry. No rash noted.     Psychiatric: She has a normal mood and affect. Her behavior is normal. Thought content normal.  Nursing note and vitals reviewed.   BP 100/60 mmHg  Pulse 80  Ht 5\' 5"  (1.651 m)  Wt 139 lb (63.05 kg)  BMI 23.13 kg/m2  Assessment and Plan: 1. Benign essential  HTN Well-controlled on current medication - CBC with Differential/Platelet - TSH  2. Polyneuropathy ([place]) Doing well on Cymbalta; followed by neurology  3. Chronic kidney disease, stage 2 (mild) Avoid nonsteroidals; check labs and advise patient of results - Comprehensive metabolic panel  4. 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 Senile ecchymosis Recommend topical antibiotics for superficial abrasions  6. Mixed hyperlipidemia Doing well on Vytorin Labs not checked today due to patient being nonfasting   Halina Maidens, MD [facility]  11/14/2015

## 2015-11-15 LAB — CBC WITH DIFFERENTIAL/PLATELET
BASOS: 0 %
Basophils Absolute: 0 10*3/uL (ref 0.0–0.2)
EOS (ABSOLUTE): 0.1 10*3/uL (ref 0.0–0.4)
Eos: 2 %
Hematocrit: 43.7 % (ref 34.0–46.6)
Hemoglobin: 14.5 g/dL (ref 11.1–15.9)
IMMATURE GRANS (ABS): 0 10*3/uL (ref 0.0–0.1)
Immature Granulocytes: 0 %
LYMPHS ABS: 2.6 10*3/uL (ref 0.7–3.1)
LYMPHS: 34 %
MCH: 28.8 pg (ref 26.6–33.0)
MCHC: 33.2 g/dL (ref 31.5–35.7)
MCV: 87 fL (ref 79–97)
Monocytes Absolute: 0.5 10*3/uL (ref 0.1–0.9)
Monocytes: 6 %
NEUTROS ABS: 4.3 10*3/uL (ref 1.4–7.0)
Neutrophils: 58 %
PLATELETS: 206 10*3/uL (ref 150–379)
RBC: 5.03 x10E6/uL (ref 3.77–5.28)
RDW: 13.8 % (ref 12.3–15.4)
WBC: 7.5 10*3/uL (ref 3.4–10.8)

## 2015-11-15 LAB — COMPREHENSIVE METABOLIC PANEL
A/G RATIO: 1.9 (ref 1.1–2.5)
ALBUMIN: 4.5 g/dL (ref 3.5–4.7)
ALK PHOS: 94 IU/L (ref 39–117)
ALT: 25 IU/L (ref 0–32)
AST: 26 IU/L (ref 0–40)
BILIRUBIN TOTAL: 0.3 mg/dL (ref 0.0–1.2)
BUN/Creatinine Ratio: 39 — ABNORMAL HIGH (ref 11–26)
BUN: 26 mg/dL (ref 8–27)
CHLORIDE: 99 mmol/L (ref 97–106)
CO2: 31 mmol/L — ABNORMAL HIGH (ref 18–29)
Calcium: 10.3 mg/dL (ref 8.7–10.3)
Creatinine, Ser: 0.67 mg/dL (ref 0.57–1.00)
GFR calc Af Amer: 93 mL/min/{1.73_m2} (ref >59)
GFR, EST NON AFRICAN AMERICAN: 81 mL/min/{1.73_m2} (ref >59)
Globulin, Total: 2.4 g/dL (ref 1.5–4.5)
Glucose: 98 mg/dL (ref 65–99)
POTASSIUM: 4.3 mmol/L (ref 3.5–5.2)
SODIUM: 145 mmol/L — AB (ref 136–144)
TOTAL PROTEIN: 6.9 g/dL (ref 6.0–8.5)

## 2015-11-15 LAB — TSH: TSH: 3.3 u[IU]/mL (ref 0.450–4.500)

## 2015-11-24 ENCOUNTER — Encounter (INDEPENDENT_AMBULATORY_CARE_PROVIDER_SITE_OTHER): Payer: Medicare Other | Admitting: Ophthalmology

## 2015-11-24 DIAGNOSIS — H43813 Vitreous degeneration, bilateral: Secondary | ICD-10-CM

## 2015-11-24 DIAGNOSIS — H353211 Exudative age-related macular degeneration, right eye, with active choroidal neovascularization: Secondary | ICD-10-CM | POA: Diagnosis not present

## 2015-11-24 DIAGNOSIS — H35033 Hypertensive retinopathy, bilateral: Secondary | ICD-10-CM | POA: Diagnosis not present

## 2015-11-24 DIAGNOSIS — I10 Essential (primary) hypertension: Secondary | ICD-10-CM | POA: Diagnosis not present

## 2015-11-24 DIAGNOSIS — H318 Other specified disorders of choroid: Secondary | ICD-10-CM | POA: Diagnosis not present

## 2015-11-24 DIAGNOSIS — H353124 Nonexudative age-related macular degeneration, left eye, advanced atrophic with subfoveal involvement: Secondary | ICD-10-CM

## 2015-11-25 DIAGNOSIS — K838 Other specified diseases of biliary tract: Secondary | ICD-10-CM | POA: Diagnosis not present

## 2015-11-25 DIAGNOSIS — B029 Zoster without complications: Secondary | ICD-10-CM | POA: Insufficient documentation

## 2015-11-25 DIAGNOSIS — J90 Pleural effusion, not elsewhere classified: Secondary | ICD-10-CM | POA: Diagnosis not present

## 2015-11-25 DIAGNOSIS — R112 Nausea with vomiting, unspecified: Secondary | ICD-10-CM | POA: Diagnosis not present

## 2015-11-25 DIAGNOSIS — Z79899 Other long term (current) drug therapy: Secondary | ICD-10-CM | POA: Diagnosis not present

## 2015-11-25 DIAGNOSIS — I82409 Acute embolism and thrombosis of unspecified deep veins of unspecified lower extremity: Secondary | ICD-10-CM | POA: Insufficient documentation

## 2015-11-25 DIAGNOSIS — I2510 Atherosclerotic heart disease of native coronary artery without angina pectoris: Secondary | ICD-10-CM | POA: Diagnosis not present

## 2015-11-25 DIAGNOSIS — R918 Other nonspecific abnormal finding of lung field: Secondary | ICD-10-CM | POA: Diagnosis not present

## 2015-11-25 DIAGNOSIS — I10 Essential (primary) hypertension: Secondary | ICD-10-CM | POA: Diagnosis not present

## 2015-11-25 DIAGNOSIS — E876 Hypokalemia: Secondary | ICD-10-CM | POA: Diagnosis not present

## 2015-11-25 DIAGNOSIS — I129 Hypertensive chronic kidney disease with stage 1 through stage 4 chronic kidney disease, or unspecified chronic kidney disease: Secondary | ICD-10-CM | POA: Diagnosis not present

## 2015-11-25 DIAGNOSIS — K219 Gastro-esophageal reflux disease without esophagitis: Secondary | ICD-10-CM | POA: Diagnosis not present

## 2015-11-25 DIAGNOSIS — Z7982 Long term (current) use of aspirin: Secondary | ICD-10-CM | POA: Diagnosis not present

## 2015-11-25 DIAGNOSIS — I700 Atherosclerosis of aorta: Secondary | ICD-10-CM | POA: Diagnosis not present

## 2015-11-25 DIAGNOSIS — K449 Diaphragmatic hernia without obstruction or gangrene: Secondary | ICD-10-CM | POA: Diagnosis not present

## 2015-11-25 DIAGNOSIS — E785 Hyperlipidemia, unspecified: Secondary | ICD-10-CM | POA: Diagnosis not present

## 2015-11-25 DIAGNOSIS — Z981 Arthrodesis status: Secondary | ICD-10-CM | POA: Diagnosis not present

## 2015-11-25 DIAGNOSIS — Z8672 Personal history of thrombophlebitis: Secondary | ICD-10-CM | POA: Insufficient documentation

## 2015-11-25 DIAGNOSIS — R9431 Abnormal electrocardiogram [ECG] [EKG]: Secondary | ICD-10-CM | POA: Diagnosis not present

## 2015-11-25 DIAGNOSIS — R1110 Vomiting, unspecified: Secondary | ICD-10-CM | POA: Diagnosis not present

## 2015-11-25 DIAGNOSIS — N182 Chronic kidney disease, stage 2 (mild): Secondary | ICD-10-CM | POA: Diagnosis not present

## 2015-11-25 DIAGNOSIS — R0902 Hypoxemia: Secondary | ICD-10-CM | POA: Diagnosis not present

## 2015-11-25 DIAGNOSIS — G629 Polyneuropathy, unspecified: Secondary | ICD-10-CM | POA: Diagnosis not present

## 2015-11-25 DIAGNOSIS — K922 Gastrointestinal hemorrhage, unspecified: Secondary | ICD-10-CM | POA: Diagnosis not present

## 2015-11-25 DIAGNOSIS — Z9049 Acquired absence of other specified parts of digestive tract: Secondary | ICD-10-CM | POA: Diagnosis not present

## 2015-11-25 DIAGNOSIS — J9811 Atelectasis: Secondary | ICD-10-CM | POA: Diagnosis not present

## 2015-11-25 DIAGNOSIS — E042 Nontoxic multinodular goiter: Secondary | ICD-10-CM | POA: Diagnosis not present

## 2015-11-26 DIAGNOSIS — J90 Pleural effusion, not elsewhere classified: Secondary | ICD-10-CM | POA: Diagnosis not present

## 2015-11-26 DIAGNOSIS — R112 Nausea with vomiting, unspecified: Secondary | ICD-10-CM | POA: Diagnosis not present

## 2015-11-26 DIAGNOSIS — J9811 Atelectasis: Secondary | ICD-10-CM | POA: Diagnosis not present

## 2015-11-26 DIAGNOSIS — I10 Essential (primary) hypertension: Secondary | ICD-10-CM | POA: Diagnosis not present

## 2015-11-26 DIAGNOSIS — G629 Polyneuropathy, unspecified: Secondary | ICD-10-CM | POA: Diagnosis not present

## 2015-12-01 ENCOUNTER — Ambulatory Visit (INDEPENDENT_AMBULATORY_CARE_PROVIDER_SITE_OTHER): Payer: Medicare Other | Admitting: Internal Medicine

## 2015-12-01 ENCOUNTER — Encounter: Payer: Self-pay | Admitting: Internal Medicine

## 2015-12-01 VITALS — BP 140/78 | HR 80 | Ht 65.0 in | Wt 145.0 lb

## 2015-12-01 DIAGNOSIS — Z8719 Personal history of other diseases of the digestive system: Secondary | ICD-10-CM | POA: Diagnosis not present

## 2015-12-01 DIAGNOSIS — I10 Essential (primary) hypertension: Secondary | ICD-10-CM

## 2015-12-01 DIAGNOSIS — I809 Phlebitis and thrombophlebitis of unspecified site: Secondary | ICD-10-CM | POA: Diagnosis not present

## 2015-12-01 DIAGNOSIS — K449 Diaphragmatic hernia without obstruction or gangrene: Secondary | ICD-10-CM | POA: Insufficient documentation

## 2015-12-01 DIAGNOSIS — E782 Mixed hyperlipidemia: Secondary | ICD-10-CM

## 2015-12-01 NOTE — Progress Notes (Signed)
Date:  12/01/2015   Name:  Amy [name]   DOB:  18-Jun-1929   MRN:  [id number]   Chief Complaint: Hospitalization Follow-up  Patient was admitted to [facility] on 11/25/2015 with intractable nausea and vomiting. She improved with IV fluids and was discharged home on 11/26/2015. Chest x-ray in the hospital showed a hiatal hernia. A CT scan of the chest showed a large hiatal hernia with a loop of colon, nonobstructed, and the chest. Her blood pressures were slightly low so Cozaar and  diuretic were held. Since being home she is felt improved. She had a little bit of nausea but no further vomiting. She's been eating only soft bland foods and avoiding eating late at night. She is aware of her hiatal hernia and we discussed the findings during hospitalization. These were likely made worse by forceful vomiting caused by food poisoning. She describes normal bowel movements that were initially dark black but are now normal in color. She shows me a firm tender vein over her left wrist. She had an IV in her hand during hospitalization. Hypertension This is a chronic problem. The current episode started more than 1 year ago. The problem is unchanged. Pertinent negatives include no chest pain, palpitations or shortness of breath. Hypertensive end-organ damage includes CAD/MI.  She has noted a little bit of edema since she's been home this week. It is not painful or worsening.  Review of Systems  Constitutional: Negative for fever, chills and diaphoresis.  HENT: Negative for hearing loss, tinnitus, trouble swallowing and voice change.   Eyes: Negative for visual disturbance.  Respiratory: Negative for chest tightness, shortness of breath and wheezing.   Cardiovascular: Negative for chest pain, palpitations and leg swelling.  Gastrointestinal: Negative for vomiting, abdominal pain, diarrhea, constipation and blood in stool.  Genitourinary: Negative for dysuria.  Musculoskeletal: Negative for back pain.  Skin:     Tender vein on left wrist  Psychiatric/Behavioral: Negative for dysphoric mood.    Patient Active Problem List   Diagnosis Date Noted  . Herpes zona 11/25/2015  . Senile ecchymosis 11/14/2015  . Mixed hyperlipidemia 11/14/2015  . Chronic kidney disease 11/09/2015  . Arthritis, degenerative 11/09/2015  . Cervical spinal cord compression ([place]) 11/09/2015  . Osteoporosis, post-menopausal 11/09/2015  . Stress incontinence 11/09/2015  . Female genuine stress incontinence 11/09/2015  . Polyneuropathy ([place]) 09/30/2014  . CAD in native artery 06/08/2014  . Benign essential HTN 06/08/2014  . MI (mitral incompetence) 06/08/2014  . 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 Authorizing Provider  aspirin EC 81 MG tablet Take 1 tablet by mouth daily.   Yes Historical Provider, MD  cyclobenzaprine (FLEXERIL) 5 MG tablet Take 1 tablet (5 mg total) by mouth 3 (three) times daily as needed. 11/14/15  Yes Glean Hess, MD  DULoxetine (CYMBALTA) 30 MG capsule Take 1 capsule by mouth daily. 08/25/15 08/24/16 Yes Historical Provider, MD  Multiple Vitamin (MULTI-VITAMINS) TABS Take 1 tablet by mouth daily.   Yes Historical Provider, MD  Omega-3 Fatty Acids (FISH OIL) 1000 MG CAPS Take by mouth.   Yes Historical Provider, MD  ranitidine (ZANTAC) 150 MG tablet Take 1 tablet by mouth 2 (two) times daily. 12/27/14  Yes Historical Provider, MD  vitamin E 1000 UNIT capsule Take 1 capsule by mouth daily.   Yes Historical Provider, MD  VYTORIN 10-20 MG tablet  10/23/15  Yes Historical Provider, MD  losartan (COZAAR) 50 MG tablet  Take 1 tablet by mouth daily. 07/21/15   Historical Provider, MD  triamterene-hydrochlorothiazide (DYAZIDE) 37.5-25 MG capsule Take 1 capsule by mouth daily. 06/08/15   Historical Provider, MD    Allergies  Allergen Reactions  . Doxycycline Hyclate Nausea Only  . Sulfa Antibiotics Rash    Other Reaction: Throat feels  funny    Past Surgical History  Procedure Laterality Date  . Plasmacytoma resection  2005    lumbar  . Bladder tack  1990  . Cataract extraction, bilateral    . Breast biopsy      Social History  Substance Use Topics  . Smoking status: Never Smoker   . Smokeless tobacco: None  . Alcohol Use: No    Medication list has been reviewed and updated.   Physical Exam  Constitutional: She is oriented to person, place, and time. She appears well-developed and well-nourished.  HENT:  Head: Normocephalic.  Eyes: Conjunctivae are normal. Pupils are equal, round, and reactive to light.  Neck: Normal range of motion. Neck supple.  Cardiovascular: Normal rate, regular rhythm and normal heart sounds.   No murmur heard. Pulmonary/Chest: Effort normal and breath sounds normal. She has no wheezes. She has no rales.  Abdominal: Soft. Bowel sounds are normal.  Musculoskeletal: She exhibits edema (trace ankle edema). She exhibits no tenderness.  Neurological: She is alert and oriented to person, place, and time.  Skin:     Psychiatric: She has a normal mood and affect.  Nursing note and vitals reviewed.   BP 140/78 mmHg  Pulse 80  Ht 5\' 5"  (1.651 m)  Wt 145 lb (65.772 kg)  BMI 24.13 kg/m2  Assessment and Plan: 1. Hiatal hernia without gangrene and obstruction Patient may advance diet as tolerated Continue to avoid eating late at night  2. H/O: upper GI bleed Check CBC; continue ranitidine - CBC with Differential/Platelet  3. Benign essential HTN Resume losartan; monitor for worsening edema and call if needed  4. Mixed hyperlipidemia Now on Vytorin - Lipid panel  5. Superficial thrombophlebitis Warm compresses and Tylenol as needed   Halina Maidens, MD [facility]  12/01/2015

## 2015-12-02 LAB — LIPID PANEL
CHOLESTEROL TOTAL: 169 mg/dL (ref 100–199)
Chol/HDL Ratio: 2.7 ratio units (ref 0.0–4.4)
HDL: 63 mg/dL (ref >39)
LDL CALC: 89 mg/dL (ref 0–99)
TRIGLYCERIDES: 87 mg/dL (ref 0–149)
VLDL CHOLESTEROL CAL: 17 mg/dL (ref 5–40)

## 2015-12-02 LAB — CBC WITH DIFFERENTIAL/PLATELET
BASOS ABS: 0 10*3/uL (ref 0.0–0.2)
Basos: 0 %
EOS (ABSOLUTE): 0.3 10*3/uL (ref 0.0–0.4)
Eos: 4 %
HEMOGLOBIN: 11.9 g/dL (ref 11.1–15.9)
Hematocrit: 36.5 % (ref 34.0–46.6)
IMMATURE GRANS (ABS): 0 10*3/uL (ref 0.0–0.1)
IMMATURE GRANULOCYTES: 0 %
LYMPHS ABS: 1.8 10*3/uL (ref 0.7–3.1)
Lymphs: 25 %
MCH: 28.2 pg (ref 26.6–33.0)
MCHC: 32.6 g/dL (ref 31.5–35.7)
MCV: 87 fL (ref 79–97)
MONOS ABS: 0.4 10*3/uL (ref 0.1–0.9)
Monocytes: 5 %
NEUTROS ABS: 4.9 10*3/uL (ref 1.4–7.0)
Neutrophils: 66 %
Platelets: 185 10*3/uL (ref 150–379)
RBC: 4.22 x10E6/uL (ref 3.77–5.28)
RDW: 14 % (ref 12.3–15.4)
WBC: 7.5 10*3/uL (ref 3.4–10.8)

## 2015-12-04 ENCOUNTER — Telehealth: Payer: Self-pay

## 2015-12-04 NOTE — Telephone Encounter (Signed)
Done/dr 

## 2015-12-11 DIAGNOSIS — D485 Neoplasm of uncertain behavior of skin: Secondary | ICD-10-CM | POA: Diagnosis not present

## 2015-12-11 DIAGNOSIS — L820 Inflamed seborrheic keratosis: Secondary | ICD-10-CM | POA: Diagnosis not present

## 2015-12-11 DIAGNOSIS — Z85828 Personal history of other malignant neoplasm of skin: Secondary | ICD-10-CM | POA: Diagnosis not present

## 2015-12-11 DIAGNOSIS — C44612 Basal cell carcinoma of skin of right upper limb, including shoulder: Secondary | ICD-10-CM | POA: Diagnosis not present

## 2015-12-11 DIAGNOSIS — L859 Epidermal thickening, unspecified: Secondary | ICD-10-CM | POA: Diagnosis not present

## 2015-12-29 ENCOUNTER — Other Ambulatory Visit (INDEPENDENT_AMBULATORY_CARE_PROVIDER_SITE_OTHER): Payer: Medicare Other | Admitting: Ophthalmology

## 2015-12-29 DIAGNOSIS — H318 Other specified disorders of choroid: Secondary | ICD-10-CM | POA: Diagnosis not present

## 2016-01-05 ENCOUNTER — Ambulatory Visit (INDEPENDENT_AMBULATORY_CARE_PROVIDER_SITE_OTHER): Payer: Medicare Other | Admitting: Internal Medicine

## 2016-01-05 ENCOUNTER — Encounter: Payer: Self-pay | Admitting: Internal Medicine

## 2016-01-05 VITALS — BP 130/70 | HR 98 | Temp 98.5°F | Ht 65.0 in | Wt 136.8 lb

## 2016-01-05 DIAGNOSIS — J40 Bronchitis, not specified as acute or chronic: Secondary | ICD-10-CM

## 2016-01-05 MED ORDER — LEVOFLOXACIN 500 MG PO TABS: 500.0000 mg | ORAL_TABLET | Freq: Every day | ORAL | Status: DC

## 2016-01-05 MED ORDER — HYDROCODONE-HOMATROPINE 5-1.5 MG/5ML PO SYRP: 5.0000 mL | ORAL_SOLUTION | Freq: Four times a day (QID) | ORAL | Status: DC | PRN

## 2016-01-05 NOTE — Progress Notes (Signed)
Date:  01/05/2016   Name:  Amy [name]   DOB:  04-Apr-1929   MRN:  [id number]   Chief Complaint: Cough Cough This is a new problem. The current episode started in the past 7 days. The problem has been unchanged. The problem occurs every few minutes. The cough is non-productive. Associated symptoms include nasal congestion, postnasal drip, a sore throat and wheezing. Pertinent negatives include no chest pain, chills, fever, headaches or shortness of breath. She has tried OTC cough suppressant for the symptoms. The treatment provided mild relief.    Review of Systems  Constitutional: Negative for fever, chills and fatigue.  HENT: Positive for postnasal drip and sore throat.   Respiratory: Positive for cough and wheezing. Negative for chest tightness and shortness of breath.   Cardiovascular: Negative for chest pain, palpitations and leg swelling.  Gastrointestinal: Negative for nausea, abdominal pain and diarrhea.  Neurological: Negative for dizziness and headaches.    Patient Active Problem List   Diagnosis Date Noted  . Hiatal hernia without gangrene and obstruction 12/01/2015  . Herpes zona 11/25/2015  . Deep vein thrombosis (DVT) of lower extremity ([place]) 11/25/2015  . Senile ecchymosis 11/14/2015  . Mixed hyperlipidemia 11/14/2015  . Chronic kidney disease 11/09/2015  . Arthritis, degenerative 11/09/2015  . Cervical spinal cord compression ([place]) 11/09/2015  . Osteoporosis, post-menopausal 11/09/2015  . Stress incontinence 11/09/2015  . Female genuine stress incontinence 11/09/2015  . Polyneuropathy ([place]) 09/30/2014  . CAD in native artery 06/08/2014  . Benign essential HTN 06/08/2014  . MI (mitral incompetence) 06/08/2014  . 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 Authorizing Provider  aspirin EC 81 MG tablet Take 1 tablet by mouth daily.   Yes Historical Provider, MD  cyclobenzaprine (FLEXERIL) 5  MG tablet Take 1 tablet (5 mg total) by mouth 3 (three) times daily as needed. 11/14/15  Yes Glean Hess, MD  DULoxetine (CYMBALTA) 30 MG capsule Take 1 capsule by mouth daily. 08/25/15 08/24/16 Yes Historical Provider, MD  losartan (COZAAR) 50 MG tablet Take 1 tablet by mouth daily. 07/21/15  Yes Historical Provider, MD  Multiple Vitamin (MULTI-VITAMINS) TABS Take 1 tablet by mouth daily.   Yes Historical Provider, MD  Omega-3 Fatty Acids (FISH OIL) 1000 MG CAPS Take by mouth.   Yes Historical Provider, MD  ranitidine (ZANTAC) 150 MG tablet Take 1 tablet by mouth 2 (two) times daily. 12/27/14  Yes Historical Provider, MD  vitamin E 1000 UNIT capsule Take 1 capsule by mouth daily.   Yes Historical Provider, MD  VYTORIN 10-20 MG tablet  10/23/15  Yes Historical Provider, MD    Allergies  Allergen Reactions  . Doxycycline Hyclate Nausea Only  . Sulfa Antibiotics Rash    Other Reaction: Throat feels funny    Past Surgical History  Procedure Laterality Date  . Plasmacytoma resection  2005    lumbar  . Bladder tack  1990  . Cataract extraction, bilateral    . Breast biopsy      Social History  Substance Use Topics  . Smoking status: Never Smoker   . Smokeless tobacco: None  . Alcohol Use: No     Medication list has been reviewed and updated.   Physical Exam  Constitutional: She is oriented to person, place, and time. She appears well-developed. No distress.  HENT:  Head: Normocephalic and atraumatic.  Right Ear: Tympanic membrane and ear canal normal.  Left Ear:  Tympanic membrane and ear canal normal.  Nose: Right sinus exhibits no maxillary sinus tenderness. Left sinus exhibits no maxillary sinus tenderness.  Mouth/Throat: Oropharynx is clear and moist.  Eyes: Conjunctivae are normal.  Cardiovascular: Normal rate, regular rhythm and normal heart sounds.   Pulmonary/Chest: Effort normal. No respiratory distress. She has decreased breath sounds in the right upper field and the  left upper field. She has no wheezes.  Musculoskeletal: Normal range of motion.  Neurological: She is alert and oriented to person, place, and time.  Skin: Skin is warm and dry. No rash noted.  Psychiatric: She has a normal mood and affect. Her behavior is normal. Thought content normal.  Nursing note and vitals reviewed.   BP 130/70 mmHg  Pulse 98  Temp(Src) 98.5 F (36.9 C) (Oral)  Ht 5\' 5"  (1.651 m)  Wt 136 lb 12.8 oz (62.052 kg)  BMI 22.76 kg/m2  SpO2 95%  Assessment and Plan: 1. Bronchitis Continue Mucinex, warm liquids and activity as tolerated Patient instructed to hold Vytorin while taking Levaquin - levofloxacin (LEVAQUIN) 500 MG tablet; Take 1 tablet (500 mg total) by mouth daily.  Dispense: 7 tablet; Refill: 0 - HYDROcodone-homatropine (HYCODAN) 5-1.5 MG/5ML syrup; Take 5 mLs by mouth every 6 (six) hours as needed for cough.  Dispense: 120 mL; Refill: 0   Halina Maidens, MD [facility]  01/05/2016

## 2016-01-08 ENCOUNTER — Telehealth: Payer: Self-pay

## 2016-01-08 NOTE — Telephone Encounter (Signed)
Patient states that she is having some issues and would like for Dr. Army Melia to call her, She states that she was told she could speak with Dr. Army Melia directly and didn't have to speak to the nurse. CB# [id number]. Thanks

## 2016-01-08 NOTE — Telephone Encounter (Signed)
I spoke with patient by phone. She states that she took 4 doses of levofloxacin and had nausea, vomiting, and vivid dreams. This morning she also had some swelling of both lips which has responded to Benadryl and ice. At her visit she was also prescribed hydrocodone cough syrup but she only took that for 1 day and does not believe that is the cause of her symptoms. I've advised her to remain off levofloxacin. Since she feels better we will hold on additional antibiotics unless her symptoms recur. She'll call me in a couple of days to let me know otherwise if symptoms worsen she'll call sooner. She's expresses her understanding. Questions were answered. Levofloxacin was added to her allergy list.

## 2016-01-19 ENCOUNTER — Encounter (INDEPENDENT_AMBULATORY_CARE_PROVIDER_SITE_OTHER): Payer: Medicare Other | Admitting: Ophthalmology

## 2016-01-19 DIAGNOSIS — I10 Essential (primary) hypertension: Secondary | ICD-10-CM | POA: Diagnosis not present

## 2016-01-19 DIAGNOSIS — H43813 Vitreous degeneration, bilateral: Secondary | ICD-10-CM | POA: Diagnosis not present

## 2016-01-19 DIAGNOSIS — H35033 Hypertensive retinopathy, bilateral: Secondary | ICD-10-CM

## 2016-01-19 DIAGNOSIS — H353231 Exudative age-related macular degeneration, bilateral, with active choroidal neovascularization: Secondary | ICD-10-CM

## 2016-03-08 ENCOUNTER — Encounter (INDEPENDENT_AMBULATORY_CARE_PROVIDER_SITE_OTHER): Payer: Medicare Other | Admitting: Ophthalmology

## 2016-03-11 ENCOUNTER — Encounter (INDEPENDENT_AMBULATORY_CARE_PROVIDER_SITE_OTHER): Payer: Medicare Other | Admitting: Ophthalmology

## 2016-03-11 DIAGNOSIS — H35033 Hypertensive retinopathy, bilateral: Secondary | ICD-10-CM

## 2016-03-11 DIAGNOSIS — I10 Essential (primary) hypertension: Secondary | ICD-10-CM | POA: Diagnosis not present

## 2016-03-11 DIAGNOSIS — H353231 Exudative age-related macular degeneration, bilateral, with active choroidal neovascularization: Secondary | ICD-10-CM | POA: Diagnosis not present

## 2016-03-11 DIAGNOSIS — H43813 Vitreous degeneration, bilateral: Secondary | ICD-10-CM

## 2016-03-14 ENCOUNTER — Encounter: Payer: Self-pay | Admitting: Internal Medicine

## 2016-03-14 ENCOUNTER — Ambulatory Visit (INDEPENDENT_AMBULATORY_CARE_PROVIDER_SITE_OTHER): Payer: Medicare Other | Admitting: Internal Medicine

## 2016-03-14 VITALS — BP 122/66 | HR 100 | Temp 100.8°F | Wt 143.8 lb

## 2016-03-14 DIAGNOSIS — J111 Influenza due to unidentified influenza virus with other respiratory manifestations: Secondary | ICD-10-CM | POA: Diagnosis not present

## 2016-03-14 LAB — POCT INFLUENZA A/B
INFLUENZA A, POC: NEGATIVE
INFLUENZA B, POC: NEGATIVE

## 2016-03-14 MED ORDER — OSELTAMIVIR PHOSPHATE 75 MG PO CAPS: 75.0000 mg | ORAL_CAPSULE | Freq: Two times a day (BID) | ORAL | Status: DC

## 2016-03-14 NOTE — Patient Instructions (Signed)

## 2016-03-14 NOTE — Progress Notes (Signed)
Date:  03/14/2016   Name:  Amy [name]   DOB:  1929-09-28   MRN:  [id number]   Chief Complaint: Cough Cough This is a new problem. The current episode started yesterday. The problem has been unchanged. The problem occurs every few minutes. The cough is non-productive. Associated symptoms include chills, a fever, headaches and myalgias. Pertinent negatives include no chest pain or wheezing. She has tried prescription cough suppressant for the symptoms. The treatment provided moderate relief.    Review of Systems  Constitutional: Positive for fever and chills.  Respiratory: Positive for cough. Negative for chest tightness and wheezing.   Cardiovascular: Negative for chest pain.  Gastrointestinal: Negative for vomiting, abdominal pain and diarrhea.  Musculoskeletal: Positive for myalgias.  Neurological: Positive for weakness and headaches. Negative for dizziness.    Patient Active Problem List   Diagnosis Date Noted  . Hiatal hernia without gangrene and obstruction 12/01/2015  . Herpes zona 11/25/2015  . Hx of deep vein thrombophlebitis of lower extremity 11/25/2015  . Senile ecchymosis 11/14/2015  . Mixed hyperlipidemia 11/14/2015  . Chronic kidney disease 11/09/2015  . Arthritis, degenerative 11/09/2015  . Cervical spinal cord compression ([place]) 11/09/2015  . Osteoporosis, post-menopausal 11/09/2015  . Stress incontinence 11/09/2015  . Female genuine stress incontinence 11/09/2015  . Polyneuropathy ([place]) 09/30/2014  . CAD in native artery 06/08/2014  . Benign essential HTN 06/08/2014  . MI (mitral incompetence) 06/08/2014  . 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 Authorizing Provider  aspirin EC 81 MG tablet Take 1 tablet by mouth daily.   Yes Historical Provider, MD  cyclobenzaprine (FLEXERIL) 5 MG tablet Take 1 tablet (5 mg total) by mouth 3 (three) times daily as needed. 11/14/15  Yes Glean Hess, MD  DULoxetine (CYMBALTA) 30 MG capsule Take 1 capsule by mouth daily. 08/25/15 08/24/16 Yes Historical Provider, MD  losartan (COZAAR) 50 MG tablet Take 1 tablet by mouth daily. 07/21/15  Yes Historical Provider, MD  Multiple Vitamin (MULTI-VITAMINS) TABS Take 1 tablet by mouth daily.   Yes Historical Provider, MD  Omega-3 Fatty Acids (FISH OIL) 1000 MG CAPS Take by mouth.   Yes Historical Provider, MD  ranitidine (ZANTAC) 150 MG tablet Take 1 tablet by mouth 2 (two) times daily. 12/27/14  Yes Historical Provider, MD  vitamin E 1000 UNIT capsule Take 1 capsule by mouth daily.   Yes Historical Provider, MD  VYTORIN 10-20 MG tablet  10/23/15  Yes Historical Provider, MD  BESIVANCE 0.6 % SUSP Apply 1 drop to eye as needed. 01/19/16   Historical Provider, MD    Allergies  Allergen Reactions  . Doxycycline Hyclate Nausea Only  . Levofloxacin Nausea And Vomiting  . Sulfa Antibiotics Rash    Other Reaction: Throat feels funny    Past Surgical History  Procedure Laterality Date  . Plasmacytoma resection  2005    lumbar  . Bladder tack  1990  . Cataract extraction, bilateral    . Breast biopsy      Social History  Substance Use Topics  . Smoking status: Never Smoker   . Smokeless tobacco: None  . Alcohol Use: No     Medication list has been reviewed and updated.   Physical Exam  Constitutional: She is oriented to person, place, and time. She appears well-developed. She has a sickly appearance. No distress.  HENT:  Head: Normocephalic and atraumatic.  Neck: Normal range of motion.  Cardiovascular: Normal rate, regular rhythm and normal heart sounds.   Pulmonary/Chest: Effort normal and breath sounds normal. No respiratory distress. She has no wheezes. She exhibits no tenderness.  Abdominal: Soft. She exhibits no mass. There is tenderness (generalized muscular discomfort). There is no guarding.  Musculoskeletal: Normal range of motion.  Lymphadenopathy:    She has no cervical  adenopathy.  Neurological: She is alert and oriented to person, place, and time.  Skin: Skin is warm and dry. No rash noted.  Psychiatric: She has a normal mood and affect. Her behavior is normal. Thought content normal.  Nursing note and vitals reviewed.   BP 122/66 mmHg  Pulse 100  Temp(Src) 100.8 F (38.2 C) (Oral)  Wt 143 lb 12.8 oz (65.227 kg)  SpO2 93%  Assessment and Plan: 1. Influenza Continue cough syrup from last visit if needed Tylenol XS 1-2 tabs three times per day Increase fluids Precautions and reasons to return are discussed - oseltamivir (TAMIFLU) 75 MG capsule; Take 1 capsule (75 mg total) by mouth 2 (two) times daily.  Dispense: 10 capsule; Refill: 0 - POCT Influenza A/B   Halina Maidens, MD [facility]  03/14/2016

## 2016-03-22 DIAGNOSIS — G629 Polyneuropathy, unspecified: Secondary | ICD-10-CM | POA: Diagnosis not present

## 2016-04-25 DIAGNOSIS — R0982 Postnasal drip: Secondary | ICD-10-CM | POA: Diagnosis not present

## 2016-04-25 DIAGNOSIS — H90A32 Mixed conductive and sensorineural hearing loss, unilateral, left ear with restricted hearing on the contralateral side: Secondary | ICD-10-CM | POA: Diagnosis not present

## 2016-04-25 DIAGNOSIS — H6121 Impacted cerumen, right ear: Secondary | ICD-10-CM | POA: Diagnosis not present

## 2016-04-29 ENCOUNTER — Encounter (INDEPENDENT_AMBULATORY_CARE_PROVIDER_SITE_OTHER): Payer: Medicare Other | Admitting: Ophthalmology

## 2016-04-29 DIAGNOSIS — H43813 Vitreous degeneration, bilateral: Secondary | ICD-10-CM

## 2016-04-29 DIAGNOSIS — H353231 Exudative age-related macular degeneration, bilateral, with active choroidal neovascularization: Secondary | ICD-10-CM | POA: Diagnosis not present

## 2016-04-29 DIAGNOSIS — I10 Essential (primary) hypertension: Secondary | ICD-10-CM

## 2016-04-29 DIAGNOSIS — H35033 Hypertensive retinopathy, bilateral: Secondary | ICD-10-CM

## 2016-05-13 ENCOUNTER — Ambulatory Visit (INDEPENDENT_AMBULATORY_CARE_PROVIDER_SITE_OTHER): Payer: Medicare Other | Admitting: Internal Medicine

## 2016-05-13 ENCOUNTER — Other Ambulatory Visit: Payer: Self-pay | Admitting: Internal Medicine

## 2016-05-13 ENCOUNTER — Encounter: Payer: Self-pay | Admitting: Internal Medicine

## 2016-05-13 VITALS — BP 126/74 | HR 61 | Resp 16 | Ht 65.0 in | Wt 141.0 lb

## 2016-05-13 DIAGNOSIS — N182 Chronic kidney disease, stage 2 (mild): Secondary | ICD-10-CM

## 2016-05-13 DIAGNOSIS — I2510 Atherosclerotic heart disease of native coronary artery without angina pectoris: Secondary | ICD-10-CM

## 2016-05-13 DIAGNOSIS — G629 Polyneuropathy, unspecified: Secondary | ICD-10-CM

## 2016-05-13 DIAGNOSIS — N393 Stress incontinence (female) (male): Secondary | ICD-10-CM | POA: Diagnosis not present

## 2016-05-13 DIAGNOSIS — I10 Essential (primary) hypertension: Secondary | ICD-10-CM

## 2016-05-13 DIAGNOSIS — C9030 Solitary plasmacytoma not having achieved remission: Secondary | ICD-10-CM | POA: Diagnosis not present

## 2016-05-13 DIAGNOSIS — Z0000 Encounter for general adult medical examination without abnormal findings: Secondary | ICD-10-CM | POA: Diagnosis not present

## 2016-05-13 DIAGNOSIS — G9520 Unspecified cord compression: Secondary | ICD-10-CM | POA: Diagnosis not present

## 2016-05-13 LAB — POCT URINALYSIS DIPSTICK
BILIRUBIN UA: NEGATIVE
GLUCOSE UA: NEGATIVE
KETONES UA: NEGATIVE
Leukocytes, UA: NEGATIVE
NITRITE UA: NEGATIVE
PH UA: 5
Protein, UA: NEGATIVE
RBC UA: NEGATIVE
Spec Grav, UA: 1.015
Urobilinogen, UA: 0.2

## 2016-05-13 NOTE — Progress Notes (Signed)
Patient: Amy [name], Female    DOB: Sep 01, 1929, 83 y.o.   MRN: [id number] Visit Date: 05/13/2016  Today's Provider: Halina Maidens, MD   Chief Complaint  Patient presents with  . Medicare Wellness   Subjective:    Annual wellness visit Amy [name] is a 82 y.o. female who presents today for her Subsequent Annual Wellness Visit. She feels fairly well. She reports exercising none. She reports she is sleeping fairly well.   ----------------------------------------------------------- Hypertension This is a chronic problem. The current episode started more than 1 year ago. The problem is unchanged. The problem is controlled. Pertinent negatives include no chest pain, headaches, palpitations or shortness of breath. Past treatments include diuretics and angiotensin blockers. The current treatment provides significant improvement.  Hyperlipidemia This is a chronic problem. The problem is controlled. Associated symptoms include myalgias. Pertinent negatives include no chest pain, leg pain or shortness of breath. Current antihyperlipidemic treatment includes statins.    Review of Systems  Constitutional: Negative for fever, fatigue and unexpected weight change.  HENT: Positive for trouble swallowing. Negative for ear pain, sinus pressure and tinnitus.   Eyes: Positive for visual disturbance.  Respiratory: Negative for shortness of breath.   Cardiovascular: Negative for chest pain, palpitations and leg swelling.  Gastrointestinal: Negative for vomiting, abdominal pain and diarrhea.  Endocrine: Negative for polydipsia and polyuria.  Genitourinary: Positive for urgency (and incontinence). Negative for dysuria.  Musculoskeletal: Positive for myalgias, arthralgias and neck stiffness.  Skin: Negative for color change and rash.  Neurological: Positive for weakness, light-headedness and numbness. Negative for dizziness, tremors and headaches.  Psychiatric/Behavioral: Negative for sleep  disturbance and dysphoric mood.    Social History   Social History  . Marital Status: Married    Spouse Name: N/A  . Number of Children: N/A  . Years of Education: N/A   Occupational History  . Not on file.   Social History Main Topics  . Smoking status: Never Smoker   . Smokeless tobacco: Not on file  . Alcohol Use: No  . Drug Use: No  . Sexual Activity: Not on file   Other Topics Concern  . Not on file   Social History Narrative    Patient Active Problem List   Diagnosis Date Noted  . Hiatal hernia without gangrene and obstruction 12/01/2015  . Herpes zona 11/25/2015  . Hx of deep vein thrombophlebitis of lower extremity 11/25/2015  . Senile ecchymosis 11/14/2015  . Mixed hyperlipidemia 11/14/2015  . Chronic kidney disease 11/09/2015  . Arthritis, degenerative 11/09/2015  . Cervical spinal cord compression ([place]) 11/09/2015  . Osteoporosis, post-menopausal 11/09/2015  . Stress incontinence 11/09/2015  . Female genuine stress incontinence 11/09/2015  . Polyneuropathy ([place]) 09/30/2014  . CAD in native artery 06/08/2014  . Benign essential HTN 06/08/2014  . MI (mitral incompetence) 06/08/2014  . 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 Plasmacytoma resection  2005    lumbar  . Bladder tack  1990  . Cataract extraction, bilateral    . Breast biopsy      Her family history includes Heart failure in her father and mother.    Previous Medications   ASPIRIN EC 81 MG TABLET    Take 1 tablet by mouth daily.   AZELASTINE HCL 0.15 % SOLN    spray 2 sprays into each nostril once daily   BESIVANCE 0.6 % SUSP    Apply 1 drop to eye as needed.   CYCLOBENZAPRINE (  FLEXERIL) 5 MG TABLET    Take 1 tablet (5 mg total) by mouth 3 (three) times daily as needed.   DULOXETINE (CYMBALTA) 30 MG CAPSULE    Take 1 capsule by mouth daily.   LOSARTAN (COZAAR) 50 MG TABLET    Take 1 tablet by mouth daily.   MULTIPLE VITAMIN  (MULTI-VITAMINS) TABS    Take 1 tablet by mouth daily.   OMEGA-3 FATTY ACIDS (FISH OIL) 1000 MG CAPS    Take by mouth.   RANITIDINE (ZANTAC) 150 MG TABLET    Take 1 tablet by mouth 2 (two) times daily.   TRIAMTERENE-HYDROCHLOROTHIAZIDE (DYAZIDE) 37.5-25 MG CAPSULE    Take 1 capsule by mouth daily.   VITAMIN E 1000 UNIT CAPSULE    Take 1 capsule by mouth daily.   VYTORIN 10-20 MG TABLET        Patient Care Team: Glean Hess, MD as PCP - General (Family Medicine) Teodoro Spray, MD as Consulting Physician (Cardiology) Vladimir Crofts, MD (Neurology)     Objective:   Vitals: BP 126/74 mmHg  Pulse 61  Resp 16  Ht 5\' 5"  (1.651 m)  Wt 141 lb (63.957 kg)  BMI 23.46 kg/m2  SpO2 100%  Physical Exam  Constitutional: She is oriented to person, place, and time. She appears well-developed and well-nourished. No distress.  HENT:  Head: Normocephalic and atraumatic.  Right Ear: Tympanic membrane and ear canal normal.  Left Ear: Tympanic membrane and ear canal normal.  Nose: Right sinus exhibits no maxillary sinus tenderness. Left sinus exhibits no maxillary sinus tenderness.  Mouth/Throat: Uvula is midline and oropharynx is clear and moist.  Eyes: Conjunctivae and EOM are normal. Right eye exhibits no discharge. Left eye exhibits no discharge. No scleral icterus.  Neck: Normal range of motion. Neck supple. Carotid bruit is not present. No erythema present. No thyromegaly present.  Cardiovascular: Normal rate, regular rhythm, normal heart sounds and normal pulses.   Pulmonary/Chest: Effort normal and breath sounds normal. No respiratory distress. She has no wheezes. Right breast exhibits no mass, no nipple discharge, no skin change and no tenderness. Left breast exhibits no mass, no nipple discharge, no skin change and no tenderness.  Abdominal: Soft. Bowel sounds are normal. There is no hepatosplenomegaly. There is no tenderness. There is no CVA tenderness.  Musculoskeletal:       Cervical  back: She exhibits decreased range of motion and tenderness.  Lymphadenopathy:    She has no cervical adenopathy.    She has no axillary adenopathy.  Neurological: She is alert and oriented to person, place, and time. She has normal reflexes. No cranial nerve deficit or sensory deficit.  Skin: Skin is warm, dry and intact. No rash noted.  Psychiatric: She has a normal mood and affect. Her speech is normal and behavior is normal. Thought content normal.  Nursing note and vitals reviewed.   Activities of Daily Living In your present state of health, do you have any difficulty performing the following activities: 05/13/2016 11/14/2015  Hearing? Tempie Donning  Vision? N N  Difficulty concentrating or making decisions? N N  Walking or climbing stairs? Y Y  Dressing or bathing? N N  Doing errands, shopping? N N  Preparing Food and eating ? N -  Using the Toilet? N -  In the past six months, have you accidently leaked urine? Y -  Do you have problems with loss of bowel control? Y -  Managing your Medications? N -  Managing  your Finances? N -  Housekeeping or managing your Housekeeping? Y -    Fall Risk Assessment Fall Risk  11/14/2015  Falls in the past year? No      Depression Screen PHQ 2/9 Scores 11/14/2015  PHQ - 2 Score 0    Cognitive Testing - 6-CIT   Correct? Score   What year is it? yes 0 Yes = 0    No = 4  What month is it? yes 0 Yes = 0    No = 3  Remember:     Pia Mau, [place], [place]     What time is it? yes 0 Yes = 0    No = 3  Count backwards from 20 to 1 yes 0 Correct = 0    1 error = 2   More than 1 error = 4  Say the months of the year in reverse. yes 0 Correct = 0    1 error = 2   More than 1 error = 4  What address did I ask you to remember? yes 0 Correct = 0  1 error = 2    2 error = 4    3 error = 6    4 error = 8    All wrong = 10       TOTAL SCORE  0/28   Interpretation:  Normal  Normal (0-7) Abnormal (8-28)        Medicare Annual Wellness  Visit Summary:  Reviewed patient's Family Medical History Reviewed and updated list of patient's medical providers Assessment of cognitive impairment was done Assessed patient's functional ability Established a written schedule for health screening [place] Completed and Reviewed  Exercise Activities and Dietary recommendations Goals    None      Immunization History  Administered Date(s) Administered  . Influenza-Unspecified 08/23/2014, 09/13/2015    Health Maintenance  Topic Date Due  . TETANUS/TDAP  05/21/1950  . ZOSTAVAX  05/22/1991  . DEXA SCAN  05/21/1996  . INFLUENZA VACCINE  07/23/2016  . PNA vac Low Risk Adult (2 of 2 - PPSV23) 08/29/2016     Discussed health benefits of physical activity, and encouraged her to engage in regular exercise appropriate for her age and condition.    ------------------------------------------------------------------------------------------------------------   Assessment & Plan:  1. Medicare annual wellness visit, subsequent Measures satisfied Aged out of mammograms - encouraged to continue self exam  2. Benign essential HTN controlled - CBC with Differential/Platelet - TSH  3. CAD in native artery Stable with no anginal symptoms; followed by Cardiology - Lipid panel  4. Cervical spinal cord compression ([facility]) S/p surgery   5. Polyneuropathy ([place]) Followed by Dr. Manuella Ghazi of neurology On cymbalta Could add B6  6. Chronic kidney disease, stage 2 (mild) - Comprehensive metabolic panel  7. 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[place]) S/p resection  8. Female genuine stress incontinence - POCT urinalysis dipstick   Halina Maidens, MD [facility]  05/13/2016

## 2016-05-13 NOTE — Patient Instructions (Signed)
Health Maintenance  Topic Date Due  . TETANUS/TDAP  05/21/1950  . ZOSTAVAX  05/22/1991  . DEXA SCAN  05/21/1996  . INFLUENZA VACCINE  07/23/2016  . PNA vac Low Risk Adult (2 of 2 - PPSV23) 08/29/2016   Breast Self-Awareness Practicing breast self-awareness may pick up problems early, prevent significant medical complications, and possibly save your life. By practicing breast self-awareness, you can become familiar with how your breasts look and feel and if your breasts are changing. This allows you to notice changes early. It can also offer you some reassurance that your breast health is good. One way to learn what is normal for your breasts and whether your breasts are changing is to do a breast self-exam. If you find a lump or something that was not present in the past, it is best to contact your caregiver right away. Other findings that should be evaluated by your caregiver include nipple discharge, especially if it is bloody; skin changes or reddening; areas where the skin seems to be pulled in (retracted); or new lumps and bumps. Breast pain is seldom associated with cancer (malignancy), but should also be evaluated by a caregiver. HOW TO PERFORM A BREAST SELF-EXAM The best time to examine your breasts is 5-7 days after your menstrual period is over. During menstruation, the breasts are lumpier, and it may be more difficult to pick up changes. If you do not menstruate, have reached menopause, or had your uterus removed (hysterectomy), you should examine your breasts at regular intervals, such as monthly. If you are breastfeeding, examine your breasts after a feeding or after using a breast pump. Breast implants do not decrease the risk for lumps or tumors, so continue to perform breast self-exams as recommended. Talk to your caregiver about how to determine the difference between the implant and breast tissue. Also, talk about the amount of pressure you should use during the exam. Over time, you  will become more familiar with the variations of your breasts and more comfortable with the exam. A breast self-exam requires you to remove all your clothes above the waist. 1. Look at your breasts and nipples. Stand in front of a mirror in a room with good lighting. With your hands on your hips, push your hands firmly downward. Look for a difference in shape, contour, and size from one breast to the other (asymmetry). Asymmetry includes puckers, dips, or bumps. Also, look for skin changes, such as reddened or scaly areas on the breasts. Look for nipple changes, such as discharge, dimpling, repositioning, or redness. 2. Carefully feel your breasts. This is best done either in the shower or tub while using soapy water or when flat on your back. Place the arm (on the side of the breast you are examining) above your head. Use the pads (not the fingertips) of your three middle fingers on your opposite hand to feel your breasts. Start in the underarm area and use  inch (2 cm) overlapping circles to feel your breast. Use 3 different levels of pressure (light, medium, and firm pressure) at each circle before moving to the next circle. The light pressure is needed to feel the tissue closest to the skin. The medium pressure will help to feel breast tissue a little deeper, while the firm pressure is needed to feel the tissue close to the ribs. Continue the overlapping circles, moving downward over the breast until you feel your ribs below your breast. Then, move one finger-width towards the center of the body. Continue  to use the  inch (2 cm) overlapping circles to feel your breast as you move slowly up toward the collar bone (clavicle) near the base of the neck. Continue the up and down exam using all 3 pressures until you reach the middle of the chest. Do this with each breast, carefully feeling for lumps or changes. 3.  Keep a written record with breast changes or normal findings for each breast. By writing this  information down, you do not need to depend only on memory for size, tenderness, or location. Write down where you are in your menstrual cycle, if you are still menstruating. Breast tissue can have some lumps or thick tissue. However, see your caregiver if you find anything that concerns you.  SEEK MEDICAL CARE IF:  You see a change in shape, contour, or size of your breasts or nipples.   You see skin changes, such as reddened or scaly areas on the breasts or nipples.   You have an unusual discharge from your nipples.   You feel a new lump or unusually thick areas.    This information is not intended to replace advice given to you by your health care provider. Make sure you discuss any questions you have with your health care provider.   Document Released: 12/09/2005 Document Revised: 11/25/2012 Document Reviewed: 03/25/2012 Elsevier Interactive Patient Education 2016 Elsevier Inc[employer]   Vitamin B-6 supplement

## 2016-05-14 LAB — CBC WITH DIFFERENTIAL/PLATELET
BASOS ABS: 0 10*3/uL (ref 0.0–0.2)
Basos: 0 %
EOS (ABSOLUTE): 0.2 10*3/uL (ref 0.0–0.4)
EOS: 2 %
HEMATOCRIT: 43.3 % (ref 34.0–46.6)
HEMOGLOBIN: 13.8 g/dL (ref 11.1–15.9)
IMMATURE GRANULOCYTES: 0 %
Immature Grans (Abs): 0 10*3/uL (ref 0.0–0.1)
LYMPHS: 25 %
Lymphocytes Absolute: 1.9 10*3/uL (ref 0.7–3.1)
MCH: 27.3 pg (ref 26.6–33.0)
MCHC: 31.9 g/dL (ref 31.5–35.7)
MCV: 86 fL (ref 79–97)
MONOCYTES: 6 %
Monocytes Absolute: 0.4 10*3/uL (ref 0.1–0.9)
Neutrophils Absolute: 5 10*3/uL (ref 1.4–7.0)
Neutrophils: 67 %
Platelets: 213 10*3/uL (ref 150–379)
RBC: 5.05 x10E6/uL (ref 3.77–5.28)
RDW: 14.6 % (ref 12.3–15.4)
WBC: 7.5 10*3/uL (ref 3.4–10.8)

## 2016-05-14 LAB — COMPREHENSIVE METABOLIC PANEL
ALBUMIN: 4.6 g/dL (ref 3.5–4.7)
ALT: 14 IU/L (ref 0–32)
AST: 20 IU/L (ref 0–40)
Albumin/Globulin Ratio: 1.9 (ref 1.2–2.2)
Alkaline Phosphatase: 80 IU/L (ref 39–117)
BUN/Creatinine Ratio: 31 — ABNORMAL HIGH (ref 12–28)
BUN: 22 mg/dL (ref 8–27)
Bilirubin Total: 0.6 mg/dL (ref 0.0–1.2)
CALCIUM: 9.5 mg/dL (ref 8.7–10.3)
CO2: 25 mmol/L (ref 18–29)
CREATININE: 0.71 mg/dL (ref 0.57–1.00)
Chloride: 97 mmol/L (ref 96–106)
GFR calc Af Amer: 90 mL/min/{1.73_m2} (ref >59)
GFR, EST NON AFRICAN AMERICAN: 78 mL/min/{1.73_m2} (ref >59)
GLOBULIN, TOTAL: 2.4 g/dL (ref 1.5–4.5)
GLUCOSE: 93 mg/dL (ref 65–99)
Potassium: 4 mmol/L (ref 3.5–5.2)
SODIUM: 144 mmol/L (ref 134–144)
Total Protein: 7 g/dL (ref 6.0–8.5)

## 2016-05-14 LAB — LIPID PANEL
CHOL/HDL RATIO: 2.8 ratio (ref 0.0–4.4)
Cholesterol, Total: 181 mg/dL (ref 100–199)
HDL: 64 mg/dL (ref >39)
LDL Calculated: 88 mg/dL (ref 0–99)
Triglycerides: 145 mg/dL (ref 0–149)
VLDL Cholesterol Cal: 29 mg/dL (ref 5–40)

## 2016-05-14 LAB — TSH: TSH: 2.36 u[IU]/mL (ref 0.450–4.500)

## 2016-06-17 ENCOUNTER — Encounter (INDEPENDENT_AMBULATORY_CARE_PROVIDER_SITE_OTHER): Payer: Medicare Other | Admitting: Ophthalmology

## 2016-06-17 DIAGNOSIS — H35033 Hypertensive retinopathy, bilateral: Secondary | ICD-10-CM

## 2016-06-17 DIAGNOSIS — H353231 Exudative age-related macular degeneration, bilateral, with active choroidal neovascularization: Secondary | ICD-10-CM

## 2016-06-17 DIAGNOSIS — H43813 Vitreous degeneration, bilateral: Secondary | ICD-10-CM | POA: Diagnosis not present

## 2016-06-17 DIAGNOSIS — I10 Essential (primary) hypertension: Secondary | ICD-10-CM

## 2016-07-03 DIAGNOSIS — C9030 Solitary plasmacytoma not having achieved remission: Secondary | ICD-10-CM | POA: Diagnosis not present

## 2016-07-25 ENCOUNTER — Ambulatory Visit
Admission: EM | Admit: 2016-07-25 | Discharge: 2016-07-25 | Disposition: A | Discharge status: Home / Self Care | Payer: Medicare Other | Attending: Family Medicine | Admitting: Family Medicine

## 2016-07-25 DIAGNOSIS — K920 Hematemesis: Secondary | ICD-10-CM | POA: Diagnosis not present

## 2016-07-25 DIAGNOSIS — K449 Diaphragmatic hernia without obstruction or gangrene: Secondary | ICD-10-CM | POA: Diagnosis not present

## 2016-07-25 DIAGNOSIS — N183 Chronic kidney disease, stage 3 (moderate): Secondary | ICD-10-CM | POA: Diagnosis not present

## 2016-07-25 DIAGNOSIS — K922 Gastrointestinal hemorrhage, unspecified: Secondary | ICD-10-CM | POA: Diagnosis not present

## 2016-07-25 DIAGNOSIS — R1084 Generalized abdominal pain: Secondary | ICD-10-CM | POA: Diagnosis not present

## 2016-07-25 DIAGNOSIS — R188 Other ascites: Secondary | ICD-10-CM | POA: Diagnosis not present

## 2016-07-25 DIAGNOSIS — K3189 Other diseases of stomach and duodenum: Secondary | ICD-10-CM | POA: Diagnosis not present

## 2016-07-25 DIAGNOSIS — I2510 Atherosclerotic heart disease of native coronary artery without angina pectoris: Secondary | ICD-10-CM | POA: Diagnosis not present

## 2016-07-25 DIAGNOSIS — R112 Nausea with vomiting, unspecified: Secondary | ICD-10-CM | POA: Diagnosis not present

## 2016-07-25 DIAGNOSIS — R110 Nausea: Secondary | ICD-10-CM | POA: Diagnosis not present

## 2016-07-25 DIAGNOSIS — J9600 Acute respiratory failure, unspecified whether with hypoxia or hypercapnia: Secondary | ICD-10-CM | POA: Diagnosis not present

## 2016-07-25 DIAGNOSIS — G629 Polyneuropathy, unspecified: Secondary | ICD-10-CM | POA: Diagnosis not present

## 2016-07-25 DIAGNOSIS — N189 Chronic kidney disease, unspecified: Secondary | ICD-10-CM | POA: Diagnosis not present

## 2016-07-25 DIAGNOSIS — R109 Unspecified abdominal pain: Secondary | ICD-10-CM | POA: Diagnosis not present

## 2016-07-25 DIAGNOSIS — I82409 Acute embolism and thrombosis of unspecified deep veins of unspecified lower extremity: Secondary | ICD-10-CM | POA: Diagnosis not present

## 2016-07-25 MED ORDER — ONDANSETRON 8 MG PO TBDP
8.0000 mg | ORAL_TABLET | Freq: Once | ORAL | Status: AC
  Administered 2016-07-25: 8 mg via ORAL

## 2016-07-25 NOTE — ED Triage Notes (Addendum)
Patient presents with the complaint of throwing up dark vomit since last night. Patient does have an emesis bag with her that does contain dark coffee ground appearance.

## 2016-07-25 NOTE — Discharge Instructions (Signed)
Go directly to ER as discussed.  °

## 2016-07-25 NOTE — ED Provider Notes (Signed)
[facility] ____________________________________________  Time seen: Approximately 12:54 PM  I have reviewed the triage vital signs and the nursing notes.   HISTORY  Chief Complaint Emesis   HPI Amy [name] is a 82 y.o. female presents with daughter at bedside for complaint of nausea and vomiting with associated abdominal pain since approximately 8 PM last night. Patient reports unsure exactly how many times she has vomited but states easily greater than 10 times. Patient reports generalized abdominal discomfort, more so in epigastric area and described as mild. Patient reports she is still nauseated at this time. Patient states her vomit is very dark in color and looks black. Patient reports she has had 2 episodes of loose stools since last night but states stool appears normal coloration. Denies any blood in stool or blood in toilet bowl.  Patient daughter reports that she has been unable to tolerate anything by mouth today. Patient reports that she had 2 episodes this morning of some dizziness with position changes. Denies dysuria. Denies fall, head injury or loss of consciousness. Denies chest pain, shortness of breath, extremity pain, actually swelling, recent sickness, recent hospitalization.  Patient daughter reports that in December 2016 she was hospitalized for concern of upper GI bleed after having with a suspect food poisoning. Patient denies any similar situation. Denies any known trigger or changes in foods.   History reviewed. No pertinent past medical history.  Patient Active Problem List   Diagnosis Date Noted  . Atherosclerosis of native coronary artery of native heart without angina pectoris 05/13/2016  . Hiatal hernia without gangrene and obstruction 12/01/2015  . Herpes zona 11/25/2015  . Hx of deep vein thrombophlebitis of lower extremity 11/25/2015  . Senile ecchymosis 11/14/2015  . Mixed hyperlipidemia 11/14/2015  . Chronic kidney disease  11/09/2015  . Arthritis, degenerative 11/09/2015  . Cervical spinal cord compression ([place]) 11/09/2015  . Osteoporosis, post-menopausal 11/09/2015  . Female genuine stress incontinence 11/09/2015  . Polyneuropathy ([place]) 09/30/2014  . Benign essential HTN 06/08/2014  . MI (mitral incompetence) 06/08/2014  . 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 bladder tack  1990  . BREAST BIOPSY    . CATARACT EXTRACTION, BILATERAL    . plasmacytoma resection  2005   lumbar    No current facility-administered medications for this encounter.   Current Outpatient Prescriptions:  .  aspirin EC 81 MG tablet, Take 1 tablet by mouth daily., Disp: , Rfl:  .  Azelastine HCl 0.15 % SOLN, spray 2 sprays into each nostril once daily, Disp: , Rfl: 1 .  BESIVANCE 0.6 % SUSP, Apply 1 drop to eye as needed., Disp: , Rfl: 1 .  DULoxetine (CYMBALTA) 30 MG capsule, Take 1 capsule by mouth daily., Disp: , Rfl:  .  losartan (COZAAR) 50 MG tablet, Take 1 tablet by mouth daily., Disp: , Rfl:  .  Multiple Vitamin (MULTI-VITAMINS) TABS, Take 1 tablet by mouth daily., Disp: , Rfl:  .  Omega-3 Fatty Acids (FISH OIL) 1000 MG CAPS, Take by mouth., Disp: , Rfl:  .  ranitidine (ZANTAC) 150 MG tablet, Take 1 tablet by mouth 2 (two) times daily., Disp: , Rfl:  .  triamterene-hydrochlorothiazide (DYAZIDE) 37.5-25 MG capsule, Take 1 capsule by mouth daily., Disp: , Rfl:  .  vitamin E 1000 UNIT capsule, Take 1 capsule by mouth daily., Disp: , Rfl:  .  VYTORIN 10-20 MG tablet, , Disp: , Rfl: 1 .  cyclobenzaprine (FLEXERIL) 5 MG  tablet, take 1 tablet by mouth three times a day if needed, Disp: 30 tablet, Rfl: 5  Allergies Doxycycline hyclate; Levofloxacin; and Sulfa antibiotics  Family History  Problem Relation Age of Onset  . Heart failure Mother   . Heart failure Father     Social History Social History  Substance Use Topics  . Smoking status: Never Smoker  . Smokeless  tobacco: Never Used  . Alcohol use No    Review of Systems Constitutional: No fever/chills Eyes: No visual changes. ENT: No sore throat. Cardiovascular: Denies chest pain. Respiratory: Denies shortness of breath. Gastrointestinal: As above. No constipation. Genitourinary: Negative for dysuria. Musculoskeletal: Negative for back pain. Skin: Negative for rash. Neurological: Negative for headaches, focal weakness or numbness.  10-point ROS otherwise negative.  ____________________________________________   PHYSICAL EXAM:  VITAL SIGNS: ED Triage Vitals  Enc Vitals Group     BP 07/25/16 1216 (!) 150/85     Pulse Rate 07/25/16 1216 (!) 103     Resp 07/25/16 1216 18     Temp 07/25/16 1216 98 F (36.7 C)     Temp Source 07/25/16 1216 Oral     SpO2 07/25/16 1216 97 %     Weight 07/25/16 1216 146 lb (66.2 kg)     Height 07/25/16 1216 5\' 5"  (1.651 m)     Head Circumference --      Peak Flow --      Pain Score 07/25/16 1218 5     Pain Loc --      Pain Edu? --      Excl. in [place]? --     Constitutional: Alert and oriented. Well appearing and in no acute distress. Eyes: Conjunctivae are normal. PERRL. EOMI. ENT      Head: Normocephalic and atraumatic.      Nose: No congestion/rhinnorhea.      Mouth/Throat: Mucous membranes are moist.Oropharynx non-erythematous. Neck: No stridor. Supple without meningismus.  Hematological/Lymphatic/Immunilogical: No cervical lymphadenopathy. Cardiovascular: Normal rate, regular rhythm. Grossly normal heart sounds.  Good peripheral circulation. Respiratory: Normal respiratory effort without tachypnea nor retractions. Breath sounds are clear and equal bilaterally. No wheezes/rales/rhonchi.. Gastrointestinal:Mild epigastric tenderness to palpation. Abdomen otherwise soft and nontender. Hypoactive bowel sounds. No CVA tenderness. Musculoskeletal:  Nontender with normal range of motion in all extremities. No midline cervical, thoracic or lumbar  tenderness to palpation. Neurologic:  Normal speech and language. No gross focal neurologic deficits are appreciated. Speech is normal.  Skin:  Skin is warm, dry and intact. No rash noted. Psychiatric: Mood and affect are normal. Speech and behavior are normal. Patient exhibits appropriate insight and judgment   ___________________________________________   LABS (all labs ordered are listed, but only abnormal results are displayed)  Labs Reviewed - No data to display ____________________________________________  PROCEDURES Procedures     INITIAL IMPRESSION / ASSESSMENT AND PLAN / ED COURSE  Pertinent labs & imaging results that were available during my care of the patient were reviewed by me and considered in my medical decision making (see chart for details).   Present for the complaints of abdominal pain with associated nausea and vomiting with appearance of coffee-ground emesis. Daughter at bedside. Denies known trigger. Discussed in detail with patient and daughter concern for GI bleed and recommend patient be seen in further evaluated emergency room of their choice. Patient states she wants her daughter to drive her, and she declines EMS transfer. Patient alert and oriented with decisional capacity. 8 mg ODT Zofran, 1 given urgent care. Patient and  daughter report that they will go to [facility] as this is where she was in the past for similar. Annie Main RN called and report given. Patient stable at the time of discharge and transfer.  Discussed follow up with Primary care physician this week. Discussed follow up and return parameters including no resolution or any worsening concerns. Patient verbalized understanding and agreed to plan.   ____________________________________________   FINAL CLINICAL IMPRESSION(S) / ED DIAGNOSES  Final diagnoses:  Generalized abdominal pain  Coffee ground emesis     Discharge Medication List as of 07/25/2016 12:37 PM      Note: This  dictation was prepared with Dragon dictation along with smaller phrase technology. Any transcriptional errors that result from this process are unintentional.    Clinical Course      Marylene Land, NP 07/25/16 1511

## 2016-07-26 DIAGNOSIS — I341 Nonrheumatic mitral (valve) prolapse: Secondary | ICD-10-CM | POA: Diagnosis not present

## 2016-07-26 DIAGNOSIS — G629 Polyneuropathy, unspecified: Secondary | ICD-10-CM | POA: Diagnosis present

## 2016-07-26 DIAGNOSIS — N183 Chronic kidney disease, stage 3 (moderate): Secondary | ICD-10-CM | POA: Diagnosis present

## 2016-07-26 DIAGNOSIS — E785 Hyperlipidemia, unspecified: Secondary | ICD-10-CM | POA: Diagnosis present

## 2016-07-26 DIAGNOSIS — K920 Hematemesis: Secondary | ICD-10-CM | POA: Diagnosis present

## 2016-07-26 DIAGNOSIS — R110 Nausea: Secondary | ICD-10-CM | POA: Diagnosis not present

## 2016-07-26 DIAGNOSIS — I129 Hypertensive chronic kidney disease with stage 1 through stage 4 chronic kidney disease, or unspecified chronic kidney disease: Secondary | ICD-10-CM | POA: Diagnosis present

## 2016-07-26 DIAGNOSIS — J90 Pleural effusion, not elsewhere classified: Secondary | ICD-10-CM | POA: Diagnosis not present

## 2016-07-26 DIAGNOSIS — I340 Nonrheumatic mitral (valve) insufficiency: Secondary | ICD-10-CM | POA: Diagnosis not present

## 2016-07-26 DIAGNOSIS — R112 Nausea with vomiting, unspecified: Secondary | ICD-10-CM | POA: Diagnosis not present

## 2016-07-26 DIAGNOSIS — I10 Essential (primary) hypertension: Secondary | ICD-10-CM | POA: Diagnosis not present

## 2016-07-26 DIAGNOSIS — I517 Cardiomegaly: Secondary | ICD-10-CM | POA: Diagnosis not present

## 2016-07-26 DIAGNOSIS — K3189 Other diseases of stomach and duodenum: Secondary | ICD-10-CM | POA: Diagnosis present

## 2016-07-26 DIAGNOSIS — K922 Gastrointestinal hemorrhage, unspecified: Secondary | ICD-10-CM | POA: Diagnosis not present

## 2016-07-26 DIAGNOSIS — J9811 Atelectasis: Secondary | ICD-10-CM | POA: Diagnosis not present

## 2016-07-26 DIAGNOSIS — R140 Abdominal distension (gaseous): Secondary | ICD-10-CM | POA: Diagnosis not present

## 2016-07-26 DIAGNOSIS — I2510 Atherosclerotic heart disease of native coronary artery without angina pectoris: Secondary | ICD-10-CM | POA: Diagnosis present

## 2016-07-26 DIAGNOSIS — R0602 Shortness of breath: Secondary | ICD-10-CM | POA: Diagnosis not present

## 2016-07-26 DIAGNOSIS — R0689 Other abnormalities of breathing: Secondary | ICD-10-CM | POA: Diagnosis not present

## 2016-07-26 DIAGNOSIS — K219 Gastro-esophageal reflux disease without esophagitis: Secondary | ICD-10-CM | POA: Diagnosis present

## 2016-07-26 DIAGNOSIS — R9431 Abnormal electrocardiogram [ECG] [EKG]: Secondary | ICD-10-CM | POA: Diagnosis not present

## 2016-07-26 DIAGNOSIS — K449 Diaphragmatic hernia without obstruction or gangrene: Secondary | ICD-10-CM | POA: Diagnosis present

## 2016-07-26 DIAGNOSIS — K562 Volvulus: Secondary | ICD-10-CM | POA: Diagnosis not present

## 2016-07-26 DIAGNOSIS — Z7982 Long term (current) use of aspirin: Secondary | ICD-10-CM | POA: Diagnosis not present

## 2016-07-26 DIAGNOSIS — K209 Esophagitis, unspecified: Secondary | ICD-10-CM | POA: Diagnosis not present

## 2016-07-26 DIAGNOSIS — R0902 Hypoxemia: Secondary | ICD-10-CM | POA: Diagnosis not present

## 2016-07-26 DIAGNOSIS — K440 Diaphragmatic hernia with obstruction, without gangrene: Secondary | ICD-10-CM | POA: Diagnosis not present

## 2016-07-26 DIAGNOSIS — J9600 Acute respiratory failure, unspecified whether with hypoxia or hypercapnia: Secondary | ICD-10-CM | POA: Diagnosis not present

## 2016-07-26 DIAGNOSIS — Z9981 Dependence on supplemental oxygen: Secondary | ICD-10-CM | POA: Diagnosis not present

## 2016-07-26 DIAGNOSIS — I82409 Acute embolism and thrombosis of unspecified deep veins of unspecified lower extremity: Secondary | ICD-10-CM | POA: Diagnosis present

## 2016-07-26 DIAGNOSIS — R918 Other nonspecific abnormal finding of lung field: Secondary | ICD-10-CM | POA: Diagnosis not present

## 2016-07-26 DIAGNOSIS — I351 Nonrheumatic aortic (valve) insufficiency: Secondary | ICD-10-CM | POA: Diagnosis not present

## 2016-07-26 DIAGNOSIS — I700 Atherosclerosis of aorta: Secondary | ICD-10-CM | POA: Diagnosis not present

## 2016-07-26 DIAGNOSIS — Z4682 Encounter for fitting and adjustment of non-vascular catheter: Secondary | ICD-10-CM | POA: Diagnosis not present

## 2016-08-02 ENCOUNTER — Encounter (INDEPENDENT_AMBULATORY_CARE_PROVIDER_SITE_OTHER): Payer: Medicare Other | Admitting: Ophthalmology

## 2016-08-04 DIAGNOSIS — Z9181 History of falling: Secondary | ICD-10-CM | POA: Diagnosis not present

## 2016-08-04 DIAGNOSIS — I129 Hypertensive chronic kidney disease with stage 1 through stage 4 chronic kidney disease, or unspecified chronic kidney disease: Secondary | ICD-10-CM | POA: Diagnosis not present

## 2016-08-04 DIAGNOSIS — Z48815 Encounter for surgical aftercare following surgery on the digestive system: Secondary | ICD-10-CM | POA: Diagnosis not present

## 2016-08-04 DIAGNOSIS — Z7982 Long term (current) use of aspirin: Secondary | ICD-10-CM | POA: Diagnosis not present

## 2016-08-04 DIAGNOSIS — N189 Chronic kidney disease, unspecified: Secondary | ICD-10-CM | POA: Diagnosis not present

## 2016-08-04 DIAGNOSIS — I2510 Atherosclerotic heart disease of native coronary artery without angina pectoris: Secondary | ICD-10-CM | POA: Diagnosis not present

## 2016-08-05 ENCOUNTER — Ambulatory Visit: Payer: Medicare Other | Admitting: Internal Medicine

## 2016-08-07 DIAGNOSIS — Z48815 Encounter for surgical aftercare following surgery on the digestive system: Secondary | ICD-10-CM | POA: Diagnosis not present

## 2016-08-07 DIAGNOSIS — Z7982 Long term (current) use of aspirin: Secondary | ICD-10-CM | POA: Diagnosis not present

## 2016-08-07 DIAGNOSIS — Z9181 History of falling: Secondary | ICD-10-CM | POA: Diagnosis not present

## 2016-08-07 DIAGNOSIS — I129 Hypertensive chronic kidney disease with stage 1 through stage 4 chronic kidney disease, or unspecified chronic kidney disease: Secondary | ICD-10-CM | POA: Diagnosis not present

## 2016-08-07 DIAGNOSIS — N189 Chronic kidney disease, unspecified: Secondary | ICD-10-CM | POA: Diagnosis not present

## 2016-08-07 DIAGNOSIS — I2510 Atherosclerotic heart disease of native coronary artery without angina pectoris: Secondary | ICD-10-CM | POA: Diagnosis not present

## 2016-08-08 DIAGNOSIS — N189 Chronic kidney disease, unspecified: Secondary | ICD-10-CM | POA: Diagnosis not present

## 2016-08-08 DIAGNOSIS — Z9181 History of falling: Secondary | ICD-10-CM | POA: Diagnosis not present

## 2016-08-08 DIAGNOSIS — I129 Hypertensive chronic kidney disease with stage 1 through stage 4 chronic kidney disease, or unspecified chronic kidney disease: Secondary | ICD-10-CM | POA: Diagnosis not present

## 2016-08-08 DIAGNOSIS — I2510 Atherosclerotic heart disease of native coronary artery without angina pectoris: Secondary | ICD-10-CM | POA: Diagnosis not present

## 2016-08-08 DIAGNOSIS — Z7982 Long term (current) use of aspirin: Secondary | ICD-10-CM | POA: Diagnosis not present

## 2016-08-08 DIAGNOSIS — Z48815 Encounter for surgical aftercare following surgery on the digestive system: Secondary | ICD-10-CM | POA: Diagnosis not present

## 2016-08-09 DIAGNOSIS — K769 Liver disease, unspecified: Secondary | ICD-10-CM | POA: Diagnosis not present

## 2016-08-09 DIAGNOSIS — R000 Tachycardia, unspecified: Secondary | ICD-10-CM | POA: Diagnosis not present

## 2016-08-09 DIAGNOSIS — J984 Other disorders of lung: Secondary | ICD-10-CM | POA: Diagnosis not present

## 2016-08-09 DIAGNOSIS — R112 Nausea with vomiting, unspecified: Secondary | ICD-10-CM | POA: Diagnosis not present

## 2016-08-09 DIAGNOSIS — R918 Other nonspecific abnormal finding of lung field: Secondary | ICD-10-CM | POA: Diagnosis not present

## 2016-08-09 DIAGNOSIS — J90 Pleural effusion, not elsewhere classified: Secondary | ICD-10-CM | POA: Diagnosis not present

## 2016-08-09 DIAGNOSIS — E860 Dehydration: Secondary | ICD-10-CM | POA: Diagnosis not present

## 2016-08-09 DIAGNOSIS — M898X9 Other specified disorders of bone, unspecified site: Secondary | ICD-10-CM | POA: Diagnosis not present

## 2016-08-09 DIAGNOSIS — K3184 Gastroparesis: Secondary | ICD-10-CM | POA: Diagnosis present

## 2016-08-09 DIAGNOSIS — I4949 Other premature depolarization: Secondary | ICD-10-CM | POA: Diagnosis not present

## 2016-08-09 DIAGNOSIS — R109 Unspecified abdominal pain: Secondary | ICD-10-CM | POA: Diagnosis not present

## 2016-08-09 DIAGNOSIS — K449 Diaphragmatic hernia without obstruction or gangrene: Secondary | ICD-10-CM | POA: Diagnosis not present

## 2016-08-09 DIAGNOSIS — Z7982 Long term (current) use of aspirin: Secondary | ICD-10-CM | POA: Diagnosis not present

## 2016-08-09 DIAGNOSIS — J811 Chronic pulmonary edema: Secondary | ICD-10-CM | POA: Diagnosis not present

## 2016-08-09 DIAGNOSIS — I10 Essential (primary) hypertension: Secondary | ICD-10-CM | POA: Diagnosis not present

## 2016-08-09 DIAGNOSIS — Z4682 Encounter for fitting and adjustment of non-vascular catheter: Secondary | ICD-10-CM | POA: Diagnosis not present

## 2016-08-09 DIAGNOSIS — M1990 Unspecified osteoarthritis, unspecified site: Secondary | ICD-10-CM | POA: Diagnosis present

## 2016-08-09 DIAGNOSIS — R1013 Epigastric pain: Secondary | ICD-10-CM | POA: Diagnosis not present

## 2016-08-09 DIAGNOSIS — I341 Nonrheumatic mitral (valve) prolapse: Secondary | ICD-10-CM | POA: Diagnosis present

## 2016-08-09 DIAGNOSIS — B9620 Unspecified Escherichia coli [E. coli] as the cause of diseases classified elsewhere: Secondary | ICD-10-CM | POA: Diagnosis not present

## 2016-08-09 DIAGNOSIS — N390 Urinary tract infection, site not specified: Secondary | ICD-10-CM | POA: Diagnosis present

## 2016-08-09 DIAGNOSIS — R197 Diarrhea, unspecified: Secondary | ICD-10-CM | POA: Diagnosis not present

## 2016-08-12 ENCOUNTER — Inpatient Hospital Stay: Payer: Medicare Other | Admitting: Internal Medicine

## 2016-08-13 ENCOUNTER — Telehealth: Payer: Self-pay

## 2016-08-14 ENCOUNTER — Telehealth: Payer: Self-pay | Admitting: Internal Medicine

## 2016-08-14 DIAGNOSIS — Z48815 Encounter for surgical aftercare following surgery on the digestive system: Secondary | ICD-10-CM | POA: Diagnosis not present

## 2016-08-14 DIAGNOSIS — I2510 Atherosclerotic heart disease of native coronary artery without angina pectoris: Secondary | ICD-10-CM | POA: Diagnosis not present

## 2016-08-14 DIAGNOSIS — I129 Hypertensive chronic kidney disease with stage 1 through stage 4 chronic kidney disease, or unspecified chronic kidney disease: Secondary | ICD-10-CM | POA: Diagnosis not present

## 2016-08-14 DIAGNOSIS — Z7982 Long term (current) use of aspirin: Secondary | ICD-10-CM | POA: Diagnosis not present

## 2016-08-14 DIAGNOSIS — N189 Chronic kidney disease, unspecified: Secondary | ICD-10-CM | POA: Diagnosis not present

## 2016-08-14 DIAGNOSIS — Z9181 History of falling: Secondary | ICD-10-CM | POA: Diagnosis not present

## 2016-08-14 NOTE — Telephone Encounter (Signed)
Talked with daughter Katharine Look.  Confusion over visit of 8/14 discussed.  I had instructed patient to go to ER - she had abdominal pain and fever and was discharged from [facility] 2 days prior after intestinal surgery.  She did not go to the ER until 8/18 at which time she had a severe UTI and was re-hospitalized.  She is home now and doing well.  Indications for OV vs ER explained - daughter expresses understanding.  Will call for follow up if needed.

## 2016-08-14 NOTE — Telephone Encounter (Signed)
complete

## 2016-08-14 NOTE — Telephone Encounter (Signed)
pts daughter called Konrad Dolores asking if you would call her in reference of her mother, said she s/w you last week.

## 2016-08-15 DIAGNOSIS — Z7982 Long term (current) use of aspirin: Secondary | ICD-10-CM | POA: Diagnosis not present

## 2016-08-15 DIAGNOSIS — Z9181 History of falling: Secondary | ICD-10-CM | POA: Diagnosis not present

## 2016-08-15 DIAGNOSIS — I2510 Atherosclerotic heart disease of native coronary artery without angina pectoris: Secondary | ICD-10-CM | POA: Diagnosis not present

## 2016-08-15 DIAGNOSIS — N189 Chronic kidney disease, unspecified: Secondary | ICD-10-CM | POA: Diagnosis not present

## 2016-08-15 DIAGNOSIS — I129 Hypertensive chronic kidney disease with stage 1 through stage 4 chronic kidney disease, or unspecified chronic kidney disease: Secondary | ICD-10-CM | POA: Diagnosis not present

## 2016-08-15 DIAGNOSIS — Z48815 Encounter for surgical aftercare following surgery on the digestive system: Secondary | ICD-10-CM | POA: Diagnosis not present

## 2016-08-20 ENCOUNTER — Telehealth: Payer: Self-pay

## 2016-08-20 DIAGNOSIS — Z9181 History of falling: Secondary | ICD-10-CM | POA: Diagnosis not present

## 2016-08-20 DIAGNOSIS — I129 Hypertensive chronic kidney disease with stage 1 through stage 4 chronic kidney disease, or unspecified chronic kidney disease: Secondary | ICD-10-CM | POA: Diagnosis not present

## 2016-08-20 DIAGNOSIS — I2510 Atherosclerotic heart disease of native coronary artery without angina pectoris: Secondary | ICD-10-CM | POA: Diagnosis not present

## 2016-08-20 DIAGNOSIS — Z48815 Encounter for surgical aftercare following surgery on the digestive system: Secondary | ICD-10-CM | POA: Diagnosis not present

## 2016-08-20 DIAGNOSIS — Z7982 Long term (current) use of aspirin: Secondary | ICD-10-CM | POA: Diagnosis not present

## 2016-08-20 DIAGNOSIS — N189 Chronic kidney disease, unspecified: Secondary | ICD-10-CM | POA: Diagnosis not present

## 2016-08-20 NOTE — Telephone Encounter (Signed)
Verbal for Upper Body Strengthening post surgery and deconditioning.

## 2016-08-21 DIAGNOSIS — I2510 Atherosclerotic heart disease of native coronary artery without angina pectoris: Secondary | ICD-10-CM | POA: Diagnosis not present

## 2016-08-21 DIAGNOSIS — Z7982 Long term (current) use of aspirin: Secondary | ICD-10-CM | POA: Diagnosis not present

## 2016-08-21 DIAGNOSIS — N189 Chronic kidney disease, unspecified: Secondary | ICD-10-CM | POA: Diagnosis not present

## 2016-08-21 DIAGNOSIS — Z48815 Encounter for surgical aftercare following surgery on the digestive system: Secondary | ICD-10-CM | POA: Diagnosis not present

## 2016-08-21 DIAGNOSIS — Z9181 History of falling: Secondary | ICD-10-CM | POA: Diagnosis not present

## 2016-08-21 DIAGNOSIS — I129 Hypertensive chronic kidney disease with stage 1 through stage 4 chronic kidney disease, or unspecified chronic kidney disease: Secondary | ICD-10-CM | POA: Diagnosis not present

## 2016-08-23 DIAGNOSIS — N189 Chronic kidney disease, unspecified: Secondary | ICD-10-CM | POA: Diagnosis not present

## 2016-08-23 DIAGNOSIS — I129 Hypertensive chronic kidney disease with stage 1 through stage 4 chronic kidney disease, or unspecified chronic kidney disease: Secondary | ICD-10-CM | POA: Diagnosis not present

## 2016-08-23 DIAGNOSIS — Z7982 Long term (current) use of aspirin: Secondary | ICD-10-CM | POA: Diagnosis not present

## 2016-08-23 DIAGNOSIS — I2510 Atherosclerotic heart disease of native coronary artery without angina pectoris: Secondary | ICD-10-CM | POA: Diagnosis not present

## 2016-08-23 DIAGNOSIS — Z9181 History of falling: Secondary | ICD-10-CM | POA: Diagnosis not present

## 2016-08-23 DIAGNOSIS — Z48815 Encounter for surgical aftercare following surgery on the digestive system: Secondary | ICD-10-CM | POA: Diagnosis not present

## 2016-08-26 DIAGNOSIS — Z9181 History of falling: Secondary | ICD-10-CM | POA: Diagnosis not present

## 2016-08-26 DIAGNOSIS — I129 Hypertensive chronic kidney disease with stage 1 through stage 4 chronic kidney disease, or unspecified chronic kidney disease: Secondary | ICD-10-CM | POA: Diagnosis not present

## 2016-08-26 DIAGNOSIS — I2510 Atherosclerotic heart disease of native coronary artery without angina pectoris: Secondary | ICD-10-CM | POA: Diagnosis not present

## 2016-08-26 DIAGNOSIS — Z7982 Long term (current) use of aspirin: Secondary | ICD-10-CM | POA: Diagnosis not present

## 2016-08-26 DIAGNOSIS — N189 Chronic kidney disease, unspecified: Secondary | ICD-10-CM | POA: Diagnosis not present

## 2016-08-26 DIAGNOSIS — Z48815 Encounter for surgical aftercare following surgery on the digestive system: Secondary | ICD-10-CM | POA: Diagnosis not present

## 2016-08-28 DIAGNOSIS — I2510 Atherosclerotic heart disease of native coronary artery without angina pectoris: Secondary | ICD-10-CM | POA: Diagnosis not present

## 2016-08-28 DIAGNOSIS — I129 Hypertensive chronic kidney disease with stage 1 through stage 4 chronic kidney disease, or unspecified chronic kidney disease: Secondary | ICD-10-CM | POA: Diagnosis not present

## 2016-08-28 DIAGNOSIS — Z9181 History of falling: Secondary | ICD-10-CM | POA: Diagnosis not present

## 2016-08-28 DIAGNOSIS — Z48815 Encounter for surgical aftercare following surgery on the digestive system: Secondary | ICD-10-CM | POA: Diagnosis not present

## 2016-08-28 DIAGNOSIS — Z7982 Long term (current) use of aspirin: Secondary | ICD-10-CM | POA: Diagnosis not present

## 2016-08-28 DIAGNOSIS — N189 Chronic kidney disease, unspecified: Secondary | ICD-10-CM | POA: Diagnosis not present

## 2016-08-29 DIAGNOSIS — Z7689 Persons encountering health services in other specified circumstances: Secondary | ICD-10-CM | POA: Diagnosis not present

## 2016-08-29 DIAGNOSIS — F324 Major depressive disorder, single episode, in partial remission: Secondary | ICD-10-CM | POA: Diagnosis not present

## 2016-08-29 DIAGNOSIS — R630 Anorexia: Secondary | ICD-10-CM | POA: Diagnosis not present

## 2016-08-29 DIAGNOSIS — F320 Major depressive disorder, single episode, mild: Secondary | ICD-10-CM | POA: Diagnosis not present

## 2016-08-29 DIAGNOSIS — R634 Abnormal weight loss: Secondary | ICD-10-CM | POA: Diagnosis not present

## 2016-08-30 ENCOUNTER — Telehealth: Payer: Self-pay

## 2016-08-30 NOTE — Telephone Encounter (Signed)
Caregiver called [facility] and cx PT. Per Sela [facility]

## 2016-09-02 DIAGNOSIS — Z9181 History of falling: Secondary | ICD-10-CM | POA: Diagnosis not present

## 2016-09-02 DIAGNOSIS — Z48815 Encounter for surgical aftercare following surgery on the digestive system: Secondary | ICD-10-CM | POA: Diagnosis not present

## 2016-09-02 DIAGNOSIS — I2510 Atherosclerotic heart disease of native coronary artery without angina pectoris: Secondary | ICD-10-CM | POA: Diagnosis not present

## 2016-09-02 DIAGNOSIS — N189 Chronic kidney disease, unspecified: Secondary | ICD-10-CM | POA: Diagnosis not present

## 2016-09-02 DIAGNOSIS — I129 Hypertensive chronic kidney disease with stage 1 through stage 4 chronic kidney disease, or unspecified chronic kidney disease: Secondary | ICD-10-CM | POA: Diagnosis not present

## 2016-09-02 DIAGNOSIS — Z7982 Long term (current) use of aspirin: Secondary | ICD-10-CM | POA: Diagnosis not present

## 2016-09-04 DIAGNOSIS — N189 Chronic kidney disease, unspecified: Secondary | ICD-10-CM | POA: Diagnosis not present

## 2016-09-04 DIAGNOSIS — I2510 Atherosclerotic heart disease of native coronary artery without angina pectoris: Secondary | ICD-10-CM | POA: Diagnosis not present

## 2016-09-04 DIAGNOSIS — Z7982 Long term (current) use of aspirin: Secondary | ICD-10-CM | POA: Diagnosis not present

## 2016-09-04 DIAGNOSIS — Z48815 Encounter for surgical aftercare following surgery on the digestive system: Secondary | ICD-10-CM | POA: Diagnosis not present

## 2016-09-04 DIAGNOSIS — Z9181 History of falling: Secondary | ICD-10-CM | POA: Diagnosis not present

## 2016-09-04 DIAGNOSIS — I129 Hypertensive chronic kidney disease with stage 1 through stage 4 chronic kidney disease, or unspecified chronic kidney disease: Secondary | ICD-10-CM | POA: Diagnosis not present

## 2016-09-05 DIAGNOSIS — Z9181 History of falling: Secondary | ICD-10-CM | POA: Diagnosis not present

## 2016-09-05 DIAGNOSIS — I2510 Atherosclerotic heart disease of native coronary artery without angina pectoris: Secondary | ICD-10-CM | POA: Diagnosis not present

## 2016-09-05 DIAGNOSIS — N189 Chronic kidney disease, unspecified: Secondary | ICD-10-CM | POA: Diagnosis not present

## 2016-09-05 DIAGNOSIS — Z48815 Encounter for surgical aftercare following surgery on the digestive system: Secondary | ICD-10-CM | POA: Diagnosis not present

## 2016-09-05 DIAGNOSIS — Z7982 Long term (current) use of aspirin: Secondary | ICD-10-CM | POA: Diagnosis not present

## 2016-09-05 DIAGNOSIS — I129 Hypertensive chronic kidney disease with stage 1 through stage 4 chronic kidney disease, or unspecified chronic kidney disease: Secondary | ICD-10-CM | POA: Diagnosis not present

## 2016-09-06 DIAGNOSIS — Z48815 Encounter for surgical aftercare following surgery on the digestive system: Secondary | ICD-10-CM | POA: Diagnosis not present

## 2016-09-06 DIAGNOSIS — N189 Chronic kidney disease, unspecified: Secondary | ICD-10-CM | POA: Diagnosis not present

## 2016-09-06 DIAGNOSIS — Z7982 Long term (current) use of aspirin: Secondary | ICD-10-CM | POA: Diagnosis not present

## 2016-09-06 DIAGNOSIS — Z9181 History of falling: Secondary | ICD-10-CM | POA: Diagnosis not present

## 2016-09-06 DIAGNOSIS — I2510 Atherosclerotic heart disease of native coronary artery without angina pectoris: Secondary | ICD-10-CM | POA: Diagnosis not present

## 2016-09-06 DIAGNOSIS — I129 Hypertensive chronic kidney disease with stage 1 through stage 4 chronic kidney disease, or unspecified chronic kidney disease: Secondary | ICD-10-CM | POA: Diagnosis not present

## 2016-09-09 DIAGNOSIS — Z48815 Encounter for surgical aftercare following surgery on the digestive system: Secondary | ICD-10-CM | POA: Diagnosis not present

## 2016-09-09 DIAGNOSIS — I2510 Atherosclerotic heart disease of native coronary artery without angina pectoris: Secondary | ICD-10-CM | POA: Diagnosis not present

## 2016-09-09 DIAGNOSIS — I129 Hypertensive chronic kidney disease with stage 1 through stage 4 chronic kidney disease, or unspecified chronic kidney disease: Secondary | ICD-10-CM | POA: Diagnosis not present

## 2016-09-09 DIAGNOSIS — N189 Chronic kidney disease, unspecified: Secondary | ICD-10-CM | POA: Diagnosis not present

## 2016-09-09 DIAGNOSIS — Z7982 Long term (current) use of aspirin: Secondary | ICD-10-CM | POA: Diagnosis not present

## 2016-09-09 DIAGNOSIS — Z9181 History of falling: Secondary | ICD-10-CM | POA: Diagnosis not present

## 2016-09-11 ENCOUNTER — Encounter: Payer: Self-pay | Admitting: Emergency Medicine

## 2016-09-11 ENCOUNTER — Observation Stay: Payer: Medicare Other

## 2016-09-11 ENCOUNTER — Observation Stay
Admission: EM | Admit: 2016-09-11 | Discharge: 2016-09-13 | Payer: Medicare Other | Attending: Internal Medicine | Admitting: Internal Medicine

## 2016-09-11 DIAGNOSIS — R630 Anorexia: Secondary | ICD-10-CM | POA: Diagnosis not present

## 2016-09-11 DIAGNOSIS — I340 Nonrheumatic mitral (valve) insufficiency: Secondary | ICD-10-CM | POA: Insufficient documentation

## 2016-09-11 DIAGNOSIS — E860 Dehydration: Secondary | ICD-10-CM | POA: Diagnosis not present

## 2016-09-11 DIAGNOSIS — D72825 Bandemia: Secondary | ICD-10-CM | POA: Diagnosis not present

## 2016-09-11 DIAGNOSIS — B029 Zoster without complications: Secondary | ICD-10-CM | POA: Insufficient documentation

## 2016-09-11 DIAGNOSIS — Z79899 Other long term (current) drug therapy: Secondary | ICD-10-CM | POA: Insufficient documentation

## 2016-09-11 DIAGNOSIS — R252 Cramp and spasm: Secondary | ICD-10-CM | POA: Diagnosis present

## 2016-09-11 DIAGNOSIS — I951 Orthostatic hypotension: Secondary | ICD-10-CM | POA: Diagnosis not present

## 2016-09-11 DIAGNOSIS — R55 Syncope and collapse: Secondary | ICD-10-CM | POA: Diagnosis not present

## 2016-09-11 DIAGNOSIS — Z8672 Personal history of thrombophlebitis: Secondary | ICD-10-CM | POA: Insufficient documentation

## 2016-09-11 DIAGNOSIS — N281 Cyst of kidney, acquired: Secondary | ICD-10-CM | POA: Diagnosis not present

## 2016-09-11 DIAGNOSIS — I2510 Atherosclerotic heart disease of native coronary artery without angina pectoris: Secondary | ICD-10-CM | POA: Insufficient documentation

## 2016-09-11 DIAGNOSIS — Z9842 Cataract extraction status, left eye: Secondary | ICD-10-CM | POA: Insufficient documentation

## 2016-09-11 DIAGNOSIS — R262 Difficulty in walking, not elsewhere classified: Secondary | ICD-10-CM

## 2016-09-11 DIAGNOSIS — Q8909 Congenital malformations of spleen: Secondary | ICD-10-CM | POA: Insufficient documentation

## 2016-09-11 DIAGNOSIS — I129 Hypertensive chronic kidney disease with stage 1 through stage 4 chronic kidney disease, or unspecified chronic kidney disease: Secondary | ICD-10-CM | POA: Insufficient documentation

## 2016-09-11 DIAGNOSIS — N3289 Other specified disorders of bladder: Secondary | ICD-10-CM | POA: Insufficient documentation

## 2016-09-11 DIAGNOSIS — Z881 Allergy status to other antibiotic agents status: Secondary | ICD-10-CM | POA: Insufficient documentation

## 2016-09-11 DIAGNOSIS — N393 Stress incontinence (female) (male): Secondary | ICD-10-CM | POA: Insufficient documentation

## 2016-09-11 DIAGNOSIS — K920 Hematemesis: Secondary | ICD-10-CM | POA: Diagnosis not present

## 2016-09-11 DIAGNOSIS — M47812 Spondylosis without myelopathy or radiculopathy, cervical region: Secondary | ICD-10-CM | POA: Diagnosis not present

## 2016-09-11 DIAGNOSIS — E782 Mixed hyperlipidemia: Secondary | ICD-10-CM | POA: Diagnosis not present

## 2016-09-11 DIAGNOSIS — F329 Major depressive disorder, single episode, unspecified: Secondary | ICD-10-CM | POA: Diagnosis not present

## 2016-09-11 DIAGNOSIS — R42 Dizziness and giddiness: Secondary | ICD-10-CM

## 2016-09-11 DIAGNOSIS — I517 Cardiomegaly: Secondary | ICD-10-CM | POA: Insufficient documentation

## 2016-09-11 DIAGNOSIS — N179 Acute kidney failure, unspecified: Secondary | ICD-10-CM | POA: Diagnosis not present

## 2016-09-11 DIAGNOSIS — N189 Chronic kidney disease, unspecified: Secondary | ICD-10-CM | POA: Insufficient documentation

## 2016-09-11 DIAGNOSIS — Z90710 Acquired absence of both cervix and uterus: Secondary | ICD-10-CM | POA: Insufficient documentation

## 2016-09-11 DIAGNOSIS — K7689 Other specified diseases of liver: Secondary | ICD-10-CM | POA: Insufficient documentation

## 2016-09-11 DIAGNOSIS — M1990 Unspecified osteoarthritis, unspecified site: Secondary | ICD-10-CM | POA: Insufficient documentation

## 2016-09-11 DIAGNOSIS — I700 Atherosclerosis of aorta: Secondary | ICD-10-CM | POA: Diagnosis not present

## 2016-09-11 DIAGNOSIS — K449 Diaphragmatic hernia without obstruction or gangrene: Secondary | ICD-10-CM | POA: Insufficient documentation

## 2016-09-11 DIAGNOSIS — Z86718 Personal history of other venous thrombosis and embolism: Secondary | ICD-10-CM | POA: Insufficient documentation

## 2016-09-11 DIAGNOSIS — J9811 Atelectasis: Secondary | ICD-10-CM | POA: Insufficient documentation

## 2016-09-11 DIAGNOSIS — J90 Pleural effusion, not elsewhere classified: Secondary | ICD-10-CM | POA: Insufficient documentation

## 2016-09-11 DIAGNOSIS — I10 Essential (primary) hypertension: Secondary | ICD-10-CM | POA: Diagnosis present

## 2016-09-11 DIAGNOSIS — R109 Unspecified abdominal pain: Secondary | ICD-10-CM | POA: Diagnosis not present

## 2016-09-11 DIAGNOSIS — Z9841 Cataract extraction status, right eye: Secondary | ICD-10-CM | POA: Insufficient documentation

## 2016-09-11 DIAGNOSIS — Z7982 Long term (current) use of aspirin: Secondary | ICD-10-CM | POA: Insufficient documentation

## 2016-09-11 DIAGNOSIS — G629 Polyneuropathy, unspecified: Secondary | ICD-10-CM

## 2016-09-11 DIAGNOSIS — R110 Nausea: Secondary | ICD-10-CM

## 2016-09-11 DIAGNOSIS — Z882 Allergy status to sulfonamides status: Secondary | ICD-10-CM | POA: Insufficient documentation

## 2016-09-11 DIAGNOSIS — C9030 Solitary plasmacytoma not having achieved remission: Secondary | ICD-10-CM | POA: Diagnosis not present

## 2016-09-11 DIAGNOSIS — R399 Unspecified symptoms and signs involving the genitourinary system: Secondary | ICD-10-CM | POA: Diagnosis not present

## 2016-09-11 DIAGNOSIS — Z8249 Family history of ischemic heart disease and other diseases of the circulatory system: Secondary | ICD-10-CM | POA: Insufficient documentation

## 2016-09-11 HISTORY — DX: Nonrheumatic mitral (valve) insufficiency: I34.0

## 2016-09-11 HISTORY — DX: Disorder of kidney and ureter, unspecified: N28.9

## 2016-09-11 HISTORY — DX: Diverticulitis of intestine, part unspecified, without perforation or abscess without bleeding: K57.92

## 2016-09-11 HISTORY — DX: Essential (primary) hypertension: I10

## 2016-09-11 HISTORY — DX: Unspecified osteoarthritis, unspecified site: M19.90

## 2016-09-11 HISTORY — DX: Hyperlipidemia, unspecified: E78.5

## 2016-09-11 HISTORY — DX: Depression, unspecified: F32.A

## 2016-09-11 HISTORY — DX: Major depressive disorder, single episode, unspecified: F32.9

## 2016-09-11 HISTORY — DX: Solitary plasmacytoma not having achieved remission: C90.30

## 2016-09-11 HISTORY — DX: Acute embolism and thrombosis of unspecified deep veins of unspecified lower extremity: I82.409

## 2016-09-11 LAB — COMPREHENSIVE METABOLIC PANEL
ALBUMIN: 3.6 g/dL (ref 3.5–5.0)
ALK PHOS: 68 U/L (ref 38–126)
ALT: 16 U/L (ref 14–54)
AST: 28 U/L (ref 15–41)
Anion gap: 7 (ref 5–15)
BILIRUBIN TOTAL: 0.4 mg/dL (ref 0.3–1.2)
BUN: 36 mg/dL — AB (ref 6–20)
CALCIUM: 9.2 mg/dL (ref 8.9–10.3)
CO2: 29 mmol/L (ref 22–32)
Chloride: 100 mmol/L — ABNORMAL LOW (ref 101–111)
Creatinine, Ser: 1.53 mg/dL — ABNORMAL HIGH (ref 0.44–1.00)
GFR calc Af Amer: 35 mL/min — ABNORMAL LOW (ref >60)
GFR calc non Af Amer: 30 mL/min — ABNORMAL LOW (ref >60)
GLUCOSE: 151 mg/dL — AB (ref 65–99)
Potassium: 4.2 mmol/L (ref 3.5–5.1)
SODIUM: 136 mmol/L (ref 135–145)
TOTAL PROTEIN: 6.5 g/dL (ref 6.5–8.1)

## 2016-09-11 LAB — CBC WITH DIFFERENTIAL/PLATELET
BASOS ABS: 0 10*3/uL (ref 0–0.1)
BASOS PCT: 0 %
EOS ABS: 0 10*3/uL (ref 0–0.7)
Eosinophils Relative: 0 %
HEMATOCRIT: 37.2 % (ref 35.0–47.0)
HEMOGLOBIN: 11.9 g/dL — AB (ref 12.0–16.0)
Lymphocytes Relative: 8 %
Lymphs Abs: 1 10*3/uL (ref 1.0–3.6)
MCH: 24.5 pg — ABNORMAL LOW (ref 26.0–34.0)
MCHC: 32 g/dL (ref 32.0–36.0)
MCV: 76.6 fL — ABNORMAL LOW (ref 80.0–100.0)
Monocytes Absolute: 0.9 10*3/uL (ref 0.2–0.9)
Monocytes Relative: 8 %
NEUTROS ABS: 10.5 10*3/uL — AB (ref 1.4–6.5)
NEUTROS PCT: 84 %
Platelets: 295 10*3/uL (ref 150–440)
RBC: 4.86 MIL/uL (ref 3.80–5.20)
RDW: 16.4 % — ABNORMAL HIGH (ref 11.5–14.5)
WBC: 12.4 10*3/uL — AB (ref 3.6–11.0)

## 2016-09-11 LAB — URINALYSIS COMPLETE WITH MICROSCOPIC (ARMC ONLY)
BACTERIA UA: NONE SEEN
Bilirubin Urine: NEGATIVE
GLUCOSE, UA: NEGATIVE mg/dL
HGB URINE DIPSTICK: NEGATIVE
Ketones, ur: NEGATIVE mg/dL
NITRITE: NEGATIVE
PROTEIN: NEGATIVE mg/dL
SPECIFIC GRAVITY, URINE: 1.012 (ref 1.005–1.030)
pH: 5 (ref 5.0–8.0)

## 2016-09-11 LAB — TROPONIN I: Troponin I: <0.03 ng/mL (ref <0.03)

## 2016-09-11 MED ORDER — SODIUM CHLORIDE 0.9 % IV SOLN
1000.0000 mL | Freq: Once | INTRAVENOUS | Status: AC
  Administered 2016-09-11: 1000 mL via INTRAVENOUS

## 2016-09-11 MED ORDER — SODIUM CHLORIDE 0.9 % IV SOLN
Freq: Once | INTRAVENOUS | Status: AC
  Administered 2016-09-11: 21:00:00 via INTRAVENOUS

## 2016-09-11 MED ORDER — GI COCKTAIL ~~LOC~~
30.0000 mL | Freq: Once | ORAL | Status: AC
  Administered 2016-09-11: 30 mL via ORAL
  Filled 2016-09-11: qty 30

## 2016-09-11 MED ORDER — FAMOTIDINE IN NACL 20-0.9 MG/50ML-% IV SOLN
20.0000 mg | Freq: Once | INTRAVENOUS | Status: AC
  Administered 2016-09-11: 20 mg via INTRAVENOUS
  Filled 2016-09-11: qty 50

## 2016-09-11 NOTE — H&P (Signed)
[facility] NAME: Amy [name]    MR#:  [id number]  DATE OF BIRTH:  02-Dec-1929  DATE OF ADMISSION:  09/11/2016  PRIMARY CARE PHYSICIAN: Halina Maidens, MD   REQUESTING/REFERRING PHYSICIAN: Jimmye Norman, MD  CHIEF COMPLAINT:   Chief Complaint  Patient presents with  . Loss of Consciousness    HISTORY OF PRESENT ILLNESS:  Amy [name]  is a 82 y.o. female who presents with Syncopal episode. Patient got up from her bed to go to the bathroom and became lightheaded and passed out. Syncopal episode was witnessed by her daughter who is helping her to the bathroom. Patient was unconscious for a few seconds and woke up oriented. No seizure activity or loss of continence. Here in the ED the patient was found to be hypotensive. She has had significant poor appetite recently with poor by mouth intake. She was also found to have AKI. Patient also reports trismus. She had a recent volvulus repair surgery about 6 weeks ago, and states that since that time all of the above symptoms have been present and progressive. Hospitals were called for admission for further evaluation.  PAST MEDICAL HISTORY:   Past Medical History:  Diagnosis Date  . Arthritis   . Depression   . Diverticulitis   . DVT (deep venous thrombosis) (HCC)    Right leg   . Hyperlipidemia   . Hypertension   . Mitral valve regurgitation   . 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 Renal disorder     PAST SURGICAL HISTORY:   Past Surgical History:  Procedure Laterality Date  . ABDOMINAL SURGERY    . bladder tack  1990  . BREAST BIOPSY    . CATARACT EXTRACTION, BILATERAL    . plasmacytoma resection  2005   lumbar    SOCIAL HISTORY:   Social History  Substance Use Topics  . Smoking status: Never Smoker  . Smokeless tobacco: Never Used  . Alcohol use No    FAMILY HISTORY:   Family History  Problem Relation Age of Onset  . Heart failure Mother   . Heart failure Father      DRUG ALLERGIES:   Allergies  Allergen Reactions  . Doxycycline Hyclate Nausea Only  . Levofloxacin Nausea And Vomiting  . Sulfa Antibiotics Rash    Other Reaction: Throat feels funny    MEDICATIONS AT HOME:   Prior to Admission medications   Medication Sig Start Date End Date Taking? Authorizing Provider  acetaminophen (TYLENOL) 500 MG tablet Take 500 mg by mouth every 4 (four) hours as needed.   Yes Historical Provider, MD  aspirin EC 81 MG tablet Take 1 tablet by mouth daily.   Yes Historical Provider, MD  cholecalciferol (VITAMIN D) 400 units TABS tablet Take 400 Units by mouth daily.   Yes Historical Provider, MD  ciprofloxacin (CIPRO) 250 MG tablet Take 250 mg by mouth 2 (two) times daily.   Yes Historical Provider, MD  cyclobenzaprine (FLEXERIL) 5 MG tablet Take 5-10 mg by mouth 3 (three) times daily as needed for muscle spasms.   Yes Historical Provider, MD  DULoxetine (CYMBALTA) 60 MG capsule Take 60 mg by mouth daily.   Yes Historical Provider, MD  losartan (COZAAR) 50 MG tablet Take 1 tablet by mouth daily. 07/21/15  Yes Historical Provider, MD  Melatonin 5 MG TABS Take 1 tablet by mouth at bedtime.   Yes Historical Provider, MD  Omega-3 Fatty Acids (FISH OIL) 1000 MG CAPS Take  2 capsules by mouth 2 (two) times daily.    Yes Historical Provider, MD  pantoprazole (PROTONIX) 40 MG tablet Take 40 mg by mouth daily.   Yes Historical Provider, MD  Polyvinyl Alcohol-Povidone (REFRESH OP) Apply 1-2 drops to eye as needed.   Yes Historical Provider, MD  ranitidine (ZANTAC) 150 MG tablet Take 1 tablet by mouth 2 (two) times daily. 12/27/14  Yes Historical Provider, MD  sucralfate (CARAFATE) 1 g tablet Take 1 g by mouth 4 (four) times daily -  with meals and at bedtime.   Yes Historical Provider, MD  triamterene-hydrochlorothiazide (DYAZIDE) 37.5-25 MG capsule Take 1 capsule by mouth daily. 04/16/16  Yes Historical Provider, MD  VYTORIN 10-20 MG tablet Take 1 tablet by mouth daily at 6  PM.  10/23/15  Yes Historical Provider, MD    REVIEW OF SYSTEMS:  Review of Systems  Constitutional: Negative for chills, fever, malaise/fatigue and weight loss.  HENT: Negative for ear pain, hearing loss and tinnitus.   Eyes: Negative for blurred vision, double vision, pain and redness.  Respiratory: Negative for cough, hemoptysis and shortness of breath.   Cardiovascular: Negative for chest pain, palpitations, orthopnea and leg swelling.  Gastrointestinal: Negative for abdominal pain, constipation, diarrhea, nausea and vomiting.       Loss of appetite  Genitourinary: Negative for dysuria, frequency and hematuria.  Musculoskeletal: Positive for falls. Negative for back pain, joint pain and neck pain.       Trismus  Skin:       No acne, rash, or lesions  Neurological: Positive for loss of consciousness and weakness. Negative for dizziness, tremors and focal weakness.  Endo/Heme/Allergies: Negative for polydipsia. Does not bruise/bleed easily.  Psychiatric/Behavioral: Negative for depression. The patient is not nervous/anxious and does not have insomnia.      VITAL SIGNS:   Vitals:   09/11/16 2030 09/11/16 2100 09/11/16 2130 09/11/16 2200  BP: 123/63 126/62 122/61 (!) 120/58  Pulse: 89 92 92 92  Resp: (!) 26 18 19  (!) 21  Temp:      TempSrc:      SpO2: 100% 100% 100% 100%  Weight:      Height:       Wt Readings from Last 3 Encounters:  09/11/16 59 kg (130 lb)  07/25/16 66.2 kg (146 lb)  05/13/16 64 kg (141 lb)    PHYSICAL EXAMINATION:  Physical Exam  Vitals reviewed. Constitutional: She is oriented to person, place, and time. She appears well-developed and well-nourished. No distress.  HENT:  Head: Normocephalic and atraumatic.  Dry mucous membranes  Eyes: Conjunctivae and EOM are normal. Pupils are equal, round, and reactive to light. No scleral icterus.  Neck: Normal range of motion. Neck supple. No JVD present. No thyromegaly present.  Cardiovascular: Normal rate,  regular rhythm and intact distal pulses.  Exam reveals no gallop and no friction rub.   No murmur heard. Respiratory: Effort normal and breath sounds normal. No respiratory distress. She has no wheezes. She has no rales.  GI: Soft. Bowel sounds are normal. She exhibits no distension. There is no tenderness.  Musculoskeletal: Normal range of motion. She exhibits no edema.  No arthritis, no gout.  No clear TMJ pathology or other mandibular pathology on physical exam, the patient does have limited range of jaw motion  Lymphadenopathy:    She has no cervical adenopathy.  Neurological: She is alert and oriented to person, place, and time. No cranial nerve deficit.  No dysarthria, no aphasia  Skin:  Skin is warm and dry. No rash noted. No erythema.  Psychiatric: She has a normal mood and affect. Her behavior is normal. Judgment and thought content normal.    LABORATORY PANEL:   CBC  Recent Labs Lab 09/11/16 1921  WBC 12.4*  HGB 11.9*  HCT 37.2  PLT 295   ------------------------------------------------------------------------------------------------------------------  Chemistries   Recent Labs Lab 09/11/16 1921  NA 136  K 4.2  CL 100*  CO2 29  GLUCOSE 151*  BUN 36*  CREATININE 1.53*  CALCIUM 9.2  AST 28  ALT 16  ALKPHOS 68  BILITOT 0.4   ------------------------------------------------------------------------------------------------------------------  Cardiac Enzymes  Recent Labs Lab 09/11/16 1921  TROPONINI <0.03   ------------------------------------------------------------------------------------------------------------------  RADIOLOGY:  No results found.  EKG:   Orders placed or performed during the hospital encounter of 09/11/16  . EKG 12-Lead  . EKG 12-Lead  . ED EKG  . ED EKG  . EKG 12-Lead  . EKG 12-Lead    IMPRESSION AND PLAN:  Principal Problem:   Acute kidney failure ([place]) - due to her poor by mouth intake, blood pressure improves some  with IV fluids in the ED, we'll continue hydration overnight and monitor for expected improvement, avoid nephrotoxins Active Problems:   Syncope - likely due to her orthostatics from low blood pressure and poor by mouth intake, hydrate as above. CT head and Echocardiogram ordered   Benign essential HTN - hold home antihypertensives for now as the patient was orthostatic and hypotensive in the ED   Poor appetite - unclear cause, patient had surgical volvulus repair 6 weeks ago as stated in history of present illness, she was also seen for gastroparesis in a subsequent visit, likely postsurgical effect. We will order a GI consult to help further evaluate her symptoms.   Trismus - also occurring since her surgery, unclear etiology. Physical exam is largely unrevealing as to the cause of the same. We've ordered a CT scan to evaluate.  All the records are reviewed and case discussed with ED provider. Management plans discussed with the patient and/or family.  DVT PROPHYLAXIS: SubQ heparin  GI PROPHYLAXIS: PPI  ADMISSION STATUS: Observation  CODE STATUS: Full Code Status History    This patient does not have a recorded code status. Please follow your organizational policy for patients in this situation.      TOTAL TIME TAKING CARE OF THIS PATIENT: 40 minutes.    Pennie Vanblarcom [place] 09/11/2016, 11:18 PM  [facility]  Office  [phone]  CC: Primary care physician; Halina Maidens, MD

## 2016-09-11 NOTE — ED Notes (Signed)
Pt. Received apple sauce and was eating with no complications (other than known limited mobility of jaw).

## 2016-09-11 NOTE — ED Triage Notes (Signed)
PER [facility]: Pt. Arrived to ED due to syncopal episode at home and with EMS , seen by PCP today due to weakness. BP with EMS when stood 79/40. abd surgery 6 weeks ago, decreased appetite since.

## 2016-09-11 NOTE — ED Notes (Signed)
Pt. Deaf in Left Ear.

## 2016-09-11 NOTE — ED Provider Notes (Signed)
[facility] Emergency Department Provider Note        Time seen: ----------------------------------------- 7:18 PM on 09/11/2016 -----------------------------------------    I have reviewed the triage vital signs and the nursing notes.   HISTORY  Chief Complaint Loss of Consciousness    HPI Amy [name] is a 82 y.o. female who presents to ER after a syncopal episode at home. She is brought in by EMS, she saw her primary care doctor today due to weakness. Blood pressure with EMS when she stood up was 79/40. She had abdominal surgery 6 weeks ago but reportedly has not been able to eat since that period time. She's been nauseous with every meal, is only drinking liquids.   No past medical history on file.  Patient Active Problem List   Diagnosis Date Noted  . Atherosclerosis of native coronary artery of native heart without angina pectoris 05/13/2016  . Hiatal hernia without gangrene and obstruction 12/01/2015  . Herpes zona 11/25/2015  . Hx of deep vein thrombophlebitis of lower extremity 11/25/2015  . Senile ecchymosis 11/14/2015  . Mixed hyperlipidemia 11/14/2015  . Chronic kidney disease 11/09/2015  . Arthritis, degenerative 11/09/2015  . Cervical spinal cord compression ([place]) 11/09/2015  . Osteoporosis, post-menopausal 11/09/2015  . Female genuine stress incontinence 11/09/2015  . Polyneuropathy ([place]) 09/30/2014  . Benign essential HTN 06/08/2014  . MI (mitral incompetence) 06/08/2014  . 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 bladder tack  1990  . BREAST BIOPSY    . CATARACT EXTRACTION, BILATERAL    . plasmacytoma resection  2005   lumbar    Allergies Doxycycline hyclate; Levofloxacin; and Sulfa antibiotics  Social History Social History  Substance Use Topics  . Smoking status: Never Smoker  . Smokeless tobacco: Never Used  . Alcohol use No    Review of  Systems Constitutional: Negative for fever. Cardiovascular: Negative for chest pain. Respiratory: Negative for shortness of breath. Gastrointestinal: Negative for abdominal pain, Positive for nausea, decreased appetite Genitourinary: Negative for dysuria. Musculoskeletal: Negative for back pain. Skin: Negative for rash. Neurological: Negative for headaches, positive for weakness  10-point ROS otherwise negative.  ____________________________________________   PHYSICAL EXAM:  VITAL SIGNS: ED Triage Vitals [09/11/16 1915]  Enc Vitals Group     BP (!) 112/57     Pulse Rate (!) 103     Resp 18     Temp 98.5 F (36.9 C)     Temp Source Oral     SpO2 97 %     Weight      Height      Head Circumference      Peak Flow      Pain Score      Pain Loc      Pain Edu?      Excl. in [place]?     Constitutional: Alert and oriented. Well appearing and in no distress. Eyes: Conjunctivae are normal. PERRL. Normal extraocular movements. ENT   Head: Normocephalic and atraumatic.   Nose: No congestion/rhinnorhea.   Mouth/Throat: Mucous membranes are moist.   Neck: No stridor. Cardiovascular: Normal rate, regular rhythm. No murmurs, rubs, or gallops. Respiratory: Normal respiratory effort without tachypnea nor retractions. Breath sounds are clear and equal bilaterally. No wheezes/rales/rhonchi. Gastrointestinal: Soft and nontender. Normal bowel sounds. Incision sites are clean dry and intact on the abdomen. Musculoskeletal: Nontender with normal range of motion in all extremities. No lower extremity tenderness nor edema. Neurologic:  Normal speech  and language. No gross focal neurologic deficits are appreciated.  Skin:  Skin is warm, dry and intact. No rash noted. Psychiatric: Mood and affect are normal. Speech and behavior are normal.  ____________________________________________  EKG: Interpreted by me. Sinus tachycardia with a rate of 102 bpm, normal PR interval, normal QRS,  normal QT interval. Normal axis.  ____________________________________________  ED COURSE:  Pertinent labs & imaging results that were available during my care of the patient were reviewed by me and considered in my medical decision making (see chart for details). Clinical Course  Patient presents to ER with weakness, near syncope likely from dehydration and poor by mouth intake. She'll receive IV fluids, we will check basic labs.  Procedures ____________________________________________   LABS (pertinent positives/negatives)  Labs Reviewed  CBC WITH DIFFERENTIAL/PLATELET - Abnormal; Notable for the following:       Result Value   WBC 12.4 (*)    Hemoglobin 11.9 (*)    MCV 76.6 (*)    MCH 24.5 (*)    RDW 16.4 (*)    Neutro Abs 10.5 (*)    All other components within normal limits  COMPREHENSIVE METABOLIC PANEL - Abnormal; Notable for the following:    Chloride 100 (*)    Glucose, Bld 151 (*)    BUN 36 (*)    Creatinine, Ser 1.53 (*)    GFR calc non Af Amer 30 (*)    GFR calc Af Amer 35 (*)    All other components within normal limits  TROPONIN I  URINALYSIS COMPLETEWITH MICROSCOPIC ([facility] ONLY)   ____________________________________________  FINAL ASSESSMENT AND PLAN  Nausea, dehydration, Orthostatic hypotension  Plan: Patient with labs and imaging as dictated above. Patient has received 2 L saline bolus here as well as a GI cocktail and IV Pepcid. Patient does not have significant improvement in her symptoms. She has just now started to have urine output. I'm concerned about sending her home given her history. She may need a GI consultation and possibly an appetite stimulant, I will discuss with the hospitalist for admission.   Earleen Newport, MD   Note: This dictation was prepared with Dragon dictation. Any transcriptional errors that result from this process are unintentional    Earleen Newport, MD 09/11/16 2251

## 2016-09-12 ENCOUNTER — Observation Stay
Admit: 2016-09-12 | Discharge: 2016-09-12 | Disposition: A | Discharge status: Home / Self Care | Payer: Medicare Other | Attending: Internal Medicine | Admitting: Internal Medicine

## 2016-09-12 ENCOUNTER — Observation Stay: Payer: Medicare Other

## 2016-09-12 DIAGNOSIS — I951 Orthostatic hypotension: Secondary | ICD-10-CM | POA: Diagnosis not present

## 2016-09-12 DIAGNOSIS — R252 Cramp and spasm: Secondary | ICD-10-CM | POA: Diagnosis present

## 2016-09-12 DIAGNOSIS — R55 Syncope and collapse: Secondary | ICD-10-CM | POA: Diagnosis not present

## 2016-09-12 DIAGNOSIS — R630 Anorexia: Secondary | ICD-10-CM | POA: Diagnosis not present

## 2016-09-12 DIAGNOSIS — N179 Acute kidney failure, unspecified: Secondary | ICD-10-CM | POA: Diagnosis not present

## 2016-09-12 DIAGNOSIS — K449 Diaphragmatic hernia without obstruction or gangrene: Secondary | ICD-10-CM | POA: Diagnosis not present

## 2016-09-12 LAB — BASIC METABOLIC PANEL
Anion gap: 6 (ref 5–15)
BUN: 25 mg/dL — ABNORMAL HIGH (ref 6–20)
CALCIUM: 8.5 mg/dL — AB (ref 8.9–10.3)
CO2: 27 mmol/L (ref 22–32)
CREATININE: 0.96 mg/dL (ref 0.44–1.00)
Chloride: 103 mmol/L (ref 101–111)
GFR calc non Af Amer: 52 mL/min — ABNORMAL LOW (ref >60)
GLUCOSE: 112 mg/dL — AB (ref 65–99)
Potassium: 3.8 mmol/L (ref 3.5–5.1)
Sodium: 136 mmol/L (ref 135–145)

## 2016-09-12 LAB — URINALYSIS COMPLETE WITH MICROSCOPIC (ARMC ONLY)
BACTERIA UA: NONE SEEN
Bilirubin Urine: NEGATIVE
Glucose, UA: NEGATIVE mg/dL
Hgb urine dipstick: NEGATIVE
Ketones, ur: NEGATIVE mg/dL
Nitrite: NEGATIVE
PROTEIN: NEGATIVE mg/dL
SPECIFIC GRAVITY, URINE: 1.011 (ref 1.005–1.030)
pH: 5 (ref 5.0–8.0)

## 2016-09-12 LAB — CBC
HCT: 32.2 % — ABNORMAL LOW (ref 35.0–47.0)
Hemoglobin: 10.3 g/dL — ABNORMAL LOW (ref 12.0–16.0)
MCH: 24.5 pg — AB (ref 26.0–34.0)
MCHC: 32 g/dL (ref 32.0–36.0)
MCV: 76.5 fL — ABNORMAL LOW (ref 80.0–100.0)
PLATELETS: 228 10*3/uL (ref 150–440)
RBC: 4.21 MIL/uL (ref 3.80–5.20)
RDW: 15.8 % — AB (ref 11.5–14.5)
WBC: 6.8 10*3/uL (ref 3.6–11.0)

## 2016-09-12 MED ORDER — SODIUM CHLORIDE 0.9 % IV BOLUS (SEPSIS)
500.0000 mL | Freq: Once | INTRAVENOUS | Status: AC
  Administered 2016-09-12: 500 mL via INTRAVENOUS

## 2016-09-12 MED ORDER — SIMVASTATIN 20 MG PO TABS
20.0000 mg | ORAL_TABLET | Freq: Every day | ORAL | Status: DC
  Administered 2016-09-12: 20 mg via ORAL
  Filled 2016-09-12: qty 1

## 2016-09-12 MED ORDER — EZETIMIBE-SIMVASTATIN 10-20 MG PO TABS: 1.0000 | ORAL_TABLET | Freq: Every day | ORAL | Status: DC

## 2016-09-12 MED ORDER — FAMOTIDINE 20 MG PO TABS
20.0000 mg | ORAL_TABLET | Freq: Two times a day (BID) | ORAL | Status: DC
  Administered 2016-09-12 (×2): 20 mg via ORAL
  Filled 2016-09-12 (×2): qty 1

## 2016-09-12 MED ORDER — POTASSIUM CHLORIDE IN NACL 20-0.9 MEQ/L-% IV SOLN
INTRAVENOUS | Status: DC
  Administered 2016-09-12: 11:00:00 via INTRAVENOUS
  Filled 2016-09-12 (×4): qty 1000

## 2016-09-12 MED ORDER — HEPARIN SODIUM (PORCINE) 5000 UNIT/ML IJ SOLN: 5000.0000 [IU] | Freq: Three times a day (TID) | INTRAMUSCULAR | Status: DC

## 2016-09-12 MED ORDER — SODIUM CHLORIDE 0.9 % IV SOLN
INTRAVENOUS | Status: DC
  Administered 2016-09-12: 02:00:00 via INTRAVENOUS

## 2016-09-12 MED ORDER — ONDANSETRON HCL 4 MG PO TABS: 4.0000 mg | ORAL_TABLET | Freq: Four times a day (QID) | ORAL | Status: DC | PRN

## 2016-09-12 MED ORDER — EZETIMIBE 10 MG PO TABS
10.0000 mg | ORAL_TABLET | Freq: Every day | ORAL | Status: DC
  Administered 2016-09-12: 10 mg via ORAL
  Filled 2016-09-12 (×2): qty 1

## 2016-09-12 MED ORDER — FAMOTIDINE 20 MG PO TABS
20.0000 mg | ORAL_TABLET | Freq: Every day | ORAL | Status: DC
  Administered 2016-09-13: 20 mg via ORAL
  Filled 2016-09-12: qty 1

## 2016-09-12 MED ORDER — SUCRALFATE 1 G PO TABS
1.0000 g | ORAL_TABLET | Freq: Three times a day (TID) | ORAL | Status: DC
  Administered 2016-09-12 – 2016-09-13 (×3): 1 g via ORAL
  Filled 2016-09-12 (×3): qty 1

## 2016-09-12 MED ORDER — IOPAMIDOL (ISOVUE-300) INJECTION 61%
15.0000 mL | INTRAVENOUS | Status: AC
  Administered 2016-09-12 (×2): 15 mL via ORAL

## 2016-09-12 MED ORDER — IOPAMIDOL (ISOVUE-300) INJECTION 61%
100.0000 mL | Freq: Once | INTRAVENOUS | Status: AC | PRN
  Administered 2016-09-12: 100 mL via INTRAVENOUS

## 2016-09-12 MED ORDER — ASPIRIN EC 81 MG PO TBEC
81.0000 mg | DELAYED_RELEASE_TABLET | Freq: Every day | ORAL | Status: DC
  Administered 2016-09-12 – 2016-09-13 (×2): 81 mg via ORAL
  Filled 2016-09-12 (×2): qty 1

## 2016-09-12 MED ORDER — ENOXAPARIN SODIUM 40 MG/0.4ML ~~LOC~~ SOLN
40.0000 mg | SUBCUTANEOUS | Status: DC
  Administered 2016-09-12: 40 mg via SUBCUTANEOUS
  Filled 2016-09-12: qty 0.4

## 2016-09-12 MED ORDER — ACETAMINOPHEN 325 MG PO TABS: 650.0000 mg | ORAL_TABLET | Freq: Four times a day (QID) | ORAL | Status: DC | PRN

## 2016-09-12 MED ORDER — DULOXETINE HCL 60 MG PO CPEP
60.0000 mg | ORAL_CAPSULE | Freq: Every day | ORAL | Status: DC
  Administered 2016-09-12 – 2016-09-13 (×2): 60 mg via ORAL
  Filled 2016-09-12 (×2): qty 1

## 2016-09-12 MED ORDER — ONDANSETRON HCL 4 MG/2ML IJ SOLN
4.0000 mg | Freq: Four times a day (QID) | INTRAMUSCULAR | Status: DC | PRN
  Administered 2016-09-12: 4 mg via INTRAVENOUS
  Filled 2016-09-12: qty 2

## 2016-09-12 MED ORDER — ACETAMINOPHEN 650 MG RE SUPP: 650.0000 mg | Freq: Four times a day (QID) | RECTAL | Status: DC | PRN

## 2016-09-12 MED ORDER — PANTOPRAZOLE SODIUM 40 MG PO TBEC
40.0000 mg | DELAYED_RELEASE_TABLET | Freq: Every day | ORAL | Status: DC
  Administered 2016-09-12 – 2016-09-13 (×2): 40 mg via ORAL
  Filled 2016-09-12 (×2): qty 1

## 2016-09-12 NOTE — Progress Notes (Signed)
[place] [facility] [place] NAME: Courney Rosenkrantz    MR#:  [id number]  DATE OF BIRTH:  [birth year]  SUBJECTIVE:  CHIEF COMPLAINT:   Chief Complaint  Patient presents with  . Loss of Consciousness  . The patient is 82 year old Caucasian female who presents to the hospital with syncopal episode. . On arrival to the hospital, she was noted to be hypotensive, in mild renal insufficiency. She was also reported to have very poor oral intake. She feels good today. Denies any abdominal pain or discomfort. The blood pressure has improved with IV fluid administration.   Review of Systems  Constitutional: Negative for chills, fever and weight loss.  HENT: Negative for congestion.   Eyes: Negative for blurred vision and double vision.  Respiratory: Negative for cough, sputum production, shortness of breath and wheezing.   Cardiovascular: Negative for chest pain, palpitations, orthopnea, leg swelling and PND.  Gastrointestinal: Negative for abdominal pain, blood in stool, constipation, diarrhea, nausea and vomiting.  Genitourinary: Negative for dysuria, frequency, hematuria and urgency.  Musculoskeletal: Negative for falls.  Neurological: Negative for dizziness, tremors, focal weakness and headaches.  Endo/Heme/Allergies: Does not bruise/bleed easily.  Psychiatric/Behavioral: Negative for depression. The patient does not have insomnia.     VITAL SIGNS: Blood pressure (!) 109/51, pulse 95, temperature 98.6 F (37 C), temperature source Oral, resp. rate 18, height 5\' 5"  (1.651 m), weight 59 kg (130 lb), SpO2 100 %.  PHYSICAL EXAMINATION:   GENERAL:  82 y.o.-year-old patient lying in the bed with no acute distress.  EYES: Pupils equal, round, reactive to light and accommodation. No scleral icterus. Extraocular muscles intact.  HEENT: Head atraumatic, normocephalic. Oropharynx and nasopharynx clear.  NECK:  Supple, no jugular venous distention. No thyroid  enlargement, no tenderness.  LUNGS: Normal breath sounds bilaterally, no wheezing, rales,rhonchi or crepitation. No use of accessory muscles of respiration.  CARDIOVASCULAR: S1, S2 normal. No murmurs, rubs, or gallops.  ABDOMEN: Soft, nontender, nondistended. Bowel sounds present. No organomegaly or mass.  EXTREMITIES: No pedal edema, cyanosis, or clubbing.  NEUROLOGIC: Cranial nerves II through XII are intact. Muscle strength 5/5 in all extremities. Sensation intact. Gait not checked.  PSYCHIATRIC: The patient is alert and oriented x 3.  SKIN: No obvious rash, lesion, or ulcer.   ORDERS/RESULTS REVIEWED:   CBC  Recent Labs Lab 09/11/16 1921 09/12/16 0527  WBC 12.4* 6.8  HGB 11.9* 10.3*  HCT 37.2 32.2*  PLT 295 228  MCV 76.6* 76.5*  MCH 24.5* 24.5*  MCHC 32.0 32.0  RDW 16.4* 15.8*  LYMPHSABS 1.0  --   MONOABS 0.9  --   EOSABS 0.0  --   BASOSABS 0.0  --    ------------------------------------------------------------------------------------------------------------------  Chemistries   Recent Labs Lab 09/11/16 1921 09/12/16 0527  NA 136 136  K 4.2 3.8  CL 100* 103  CO2 29 27  GLUCOSE 151* 112*  BUN 36* 25*  CREATININE 1.53* 0.96  CALCIUM 9.2 8.5*  AST 28  --   ALT 16  --   ALKPHOS 68  --   BILITOT 0.4  --    ------------------------------------------------------------------------------------------------------------------ estimated creatinine clearance is 38.6 mL/min (by C-G formula based on SCr of 0.96 mg/dL). ------------------------------------------------------------------------------------------------------------------ No results for input(s): TSH, T4TOTAL, T3FREE, THYROIDAB in the last 72 hours.  Invalid input(s): FREET3  Cardiac Enzymes  Recent Labs Lab 09/11/16 1921  TROPONINI <0.03   ------------------------------------------------------------------------------------------------------------------ Invalid input(s):  POCBNP ---------------------------------------------------------------------------------------------------------------  RADIOLOGY: Ct Head Wo Contrast  Result Date:  09/12/2016 CLINICAL DATA:  Syncopal episode. Weakness. Abdominal surgery 6 weeks ago with decreased appetite. EXAM: CT HEAD WITHOUT CONTRAST CT MAXILLOFACIAL WITHOUT CONTRAST TECHNIQUE: Multidetector CT imaging of the head and maxillofacial structures were performed using the standard protocol without intravenous contrast. Multiplanar CT image reconstructions of the maxillofacial structures were also generated. COMPARISON:  None. FINDINGS: CT HEAD FINDINGS Brain: Diffuse cerebral atrophy. Ventricular dilatation consistent with central atrophy. Low-attenuation changes in the deep white matter consistent small vessel ischemia. Gray-white matter junctions are distinct. Basal cisterns are not effaced. No acute intracranial hemorrhage. No mass-effect or midline shift. Vascular: Atherosclerotic vascular calcifications are present. Skull: Normal. Negative for fracture or focal lesion. Postoperative changes with posterior fixation from the skullbase to the upper cervical spine. Sinuses/Orbits: No acute finding. Other: None. CT MAXILLOFACIAL FINDINGS Osseous: Postoperative changes with old fracture deformity of C2. Posterior rod and screw fixation from the skullbase through C4. Orbital rims, maxillary antral walls, nasal bones, zygomatic arches, pterygoid plates, mandibles, and temporomandibular joints appear intact. No acute displaced fractures identified. Deformity of the mandibular heads bilaterally consistent with degenerative change. Degenerative changes in the cervical spine. Orbits: Negative. No traumatic or inflammatory finding. Sinuses: Clear. Soft tissues: Negative. Limited intracranial: See above. IMPRESSION: No acute intracranial abnormalities. Diffuse atrophy and small vessel ischemic changes. No acute displaced fractures in orbits or facial  bones. Degenerative changes in the temporomandibular joints bilaterally. Postoperative changes in the upper cervical spine. Electronically Signed   By: Lucienne Capers M.D.   On: 09/12/2016 01:20   US Renal  Result Date: 09/12/2016 CLINICAL DATA:  Acute renal failure EXAM: RENAL / URINARY TRACT ULTRASOUND COMPLETE COMPARISON:  03/29/2014 FINDINGS: Right Kidney: Length: 8.9 cm. Normal echogenicity. Multiple tiny cysts are noted the largest measures 6 x 6 mm. No hydronephrosis. A nonobstructive calculus in midpole measures about 3 mm. Left Kidney: Length: 9.2 cm. Normal echogenicity. No hydronephrosis. Few renal cysts are noted. The largest cyst in lower pole measures 1.4 x 1.4 cm. Bladder: Appears normal for degree of bladder distention. IMPRESSION: 1. No hydronephrosis. Bilateral renal cysts. Nonobstructive calculus midpole of the right kidney measures 3 mm. Unremarkable urinary bladder. Electronically Signed   By: Lahoma Crocker M.D.   On: 09/12/2016 11:55   Ct Maxillofacial Wo Contrast  Result Date: 09/12/2016 CLINICAL DATA:  Syncopal episode. Weakness. Abdominal surgery 6 weeks ago with decreased appetite. EXAM: CT HEAD WITHOUT CONTRAST CT MAXILLOFACIAL WITHOUT CONTRAST TECHNIQUE: Multidetector CT imaging of the head and maxillofacial structures were performed using the standard protocol without intravenous contrast. Multiplanar CT image reconstructions of the maxillofacial structures were also generated. COMPARISON:  None. FINDINGS: CT HEAD FINDINGS Brain: Diffuse cerebral atrophy. Ventricular dilatation consistent with central atrophy. Low-attenuation changes in the deep white matter consistent small vessel ischemia. Gray-white matter junctions are distinct. Basal cisterns are not effaced. No acute intracranial hemorrhage. No mass-effect or midline shift. Vascular: Atherosclerotic vascular calcifications are present. Skull: Normal. Negative for fracture or focal lesion. Postoperative changes with posterior  fixation from the skullbase to the upper cervical spine. Sinuses/Orbits: No acute finding. Other: None. CT MAXILLOFACIAL FINDINGS Osseous: Postoperative changes with old fracture deformity of C2. Posterior rod and screw fixation from the skullbase through C4. Orbital rims, maxillary antral walls, nasal bones, zygomatic arches, pterygoid plates, mandibles, and temporomandibular joints appear intact. No acute displaced fractures identified. Deformity of the mandibular heads bilaterally consistent with degenerative change. Degenerative changes in the cervical spine. Orbits: Negative. No traumatic or inflammatory finding. Sinuses: Clear. Soft tissues: Negative. Limited intracranial:  See above. IMPRESSION: No acute intracranial abnormalities. Diffuse atrophy and small vessel ischemic changes. No acute displaced fractures in orbits or facial bones. Degenerative changes in the temporomandibular joints bilaterally. Postoperative changes in the upper cervical spine. Electronically Signed   By: Lucienne Capers M.D.   On: 09/12/2016 01:20    EKG:  Orders placed or performed during the hospital encounter of 09/11/16  . EKG 12-Lead  . EKG 12-Lead  . ED EKG  . ED EKG  . EKG 12-Lead  . EKG 12-Lead    ASSESSMENT AND PLAN:  Principal Problem:   Acute kidney failure (HCC) Active Problems:   Polyneuropathy (HCC)   Benign essential HTN   Mixed hyperlipidemia   Syncope   Poor appetite   Trismus  #1. Marland Kitchen Syncope, likely orthostatic hypotension related, patient's blood pressure still is still taking, administer one more bolus of 500 cc of normal saline, continue normal saline at 75 cc an hour, follow orthostatic vital signs closely, oral intake #2 Acute renal failure, likely dehydration, resolved with IV fluid administration #3. Anorexia, etiology is clear, patient had  Volvulus repair surgery about 6 weeks ago, get CT scan of abdomen #4. Orthostatic hypotension, continue IV fluids, following blood pressure  readings closely   Management plans discussed with the patient, family and they are in agreement.   DRUG ALLERGIES:  Allergies  Allergen Reactions  . Doxycycline Hyclate Nausea Only  . Levofloxacin Nausea And Vomiting  . Sulfa Antibiotics Rash    Other Reaction: Throat feels funny    CODE STATUS:     Code Status Orders        Start     Ordered   09/12/16 0054  Full code  Continuous     09/12/16 0053    Code Status History    Date Active Date Inactive Code Status Order ID Comments User Context   09/12/2016 12:53 AM 09/12/2016  1:06 PM Full Code [id number]  Lance Coon, MD Inpatient    Advance Directive Documentation   Flowsheet Row Most Recent Value  Type of Advance Directive  Healthcare Power of Attorney  Pre-existing out of facility DNR order (yellow form or pink MOST form)  No data  "MOST" Form in Place?  No data      TOTAL TIME TAKING CARE OF THIS PATIENT: 40 minutes.    Theodoro Grist M.D on 09/12/2016 at 1:09 PM  Between 7am to 6pm - Pager - [phone]  After 6pm go to [email or web address] - password [email or web address] [facility]  Office  [phone]  CC: Primary care physician; Halina Maidens, MD

## 2016-09-12 NOTE — Progress Notes (Signed)
*  PRELIMINARY RESULTS* Echocardiogram 2D Echocardiogram has been performed.  Amy [name] 09/12/2016, 3:49 PM

## 2016-09-12 NOTE — ED Notes (Signed)
Patient transported to CT 

## 2016-09-12 NOTE — Care Management (Signed)
Patient active with [facility] for SN, PT, OT and HHA.

## 2016-09-12 NOTE — Evaluation (Signed)
Physical Therapy Evaluation Patient Details Name: Amy [name] MRN: [id number] DOB: 12-03-1929 Today's Date: 09/12/2016   History of Present Illness  Pt is admitted for acute kidney failure. Pt with complaints of LOC with syncopal episode from getting up out of bed and passed out.   Clinical Impression  Pt is a pleasant 82 year old female who was admitted for acute kidney failure. Pt performs bed mobility/transfers with supervision and ambulation with cga and RW. Orthostatics negative per RN. Slight dizziness noted upon first standing, however quickly goes away. Pt demonstrates deficits with strength/balance/endurance. Would benefit from skilled PT to address above deficits and promote optimal return to PLOF. Recommend transition to [place] upon discharge from acute hospitalization.       Follow Up Recommendations Home health PT    Equipment Recommendations       Recommendations for Other Services       Precautions / Restrictions Precautions Precautions: Fall Restrictions Weight Bearing Restrictions: No      Mobility  Bed Mobility Overal bed mobility: Needs Assistance Bed Mobility: Supine to Sit     Supine to sit: Supervision     General bed mobility comments: safe technique performed with pt slightly impulsive and trying to get OOB prior to therapist instruction.  Transfers Overall transfer level: Needs assistance Equipment used: Rolling walker (2 wheeled) Transfers: Sit to/from Stand Sit to Stand: Min guard         General transfer comment: Pt very hesitant and fearful, however once standing, is able to stand with supervision. Slight post leaning noted, however able to self correct with cues  Ambulation/Gait Ambulation/Gait assistance: Min guard Ambulation Distance (Feet): 50 Feet Assistive device: Rolling walker (2 wheeled) Gait Pattern/deviations: Step-through pattern     General Gait Details: ambulated using rw and reciprocal gait pattern. Pt fatigues  with ambulation and requests to return back to room.  Stairs            Wheelchair Mobility    Modified Rankin (Stroke Patients Only)       Balance Overall balance assessment: Needs assistance Sitting-balance support: Feet supported Sitting balance-Leahy Scale: Normal     Standing balance support: Bilateral upper extremity supported Standing balance-Leahy Scale: Good                               Pertinent Vitals/Pain Pain Assessment: No/denies pain    Home Living Family/patient expects to be discharged to:: Private residence Living Arrangements: Children;Spouse/significant other Available Help at Discharge: Family;Available 24 hours/day Type of Home: House Home Access: Stairs to enter Entrance Stairs-Rails: Can reach both Entrance Stairs-Number of Steps: 7 Home Layout: One level Home Equipment: Walker - standard (lift chair; electric bed)      Prior Function Level of Independence: Independent with assistive device(s)         Comments: uses SW for mobility     Hand Dominance        Extremity/Trunk Assessment   Upper Extremity Assessment: Overall WFL for tasks assessed           Lower Extremity Assessment: Generalized weakness (grossly 4/5)         Communication   Communication: No difficulties  Cognition Arousal/Alertness: Awake/alert Behavior During Therapy: WFL for tasks assessed/performed Overall Cognitive Status: Within Functional Limits for tasks assessed                      General Comments  Exercises     Assessment/Plan    PT Assessment Patient needs continued PT services  PT Problem List Decreased strength;Decreased activity tolerance;Decreased balance;Decreased mobility          PT Treatment Interventions DME instruction;Gait training;Therapeutic exercise    PT Goals (Current goals can be found in the Care Plan section)  Acute Rehab PT Goals Patient Stated Goal: to go home PT Goal  Formulation: With patient Time For Goal Achievement: 09/26/16 Potential to Achieve Goals: Good    Frequency Min 2X/week   Barriers to discharge        Co-evaluation               End of Session Equipment Utilized During Treatment: Gait belt Activity Tolerance: Patient tolerated treatment well Patient left: in bed;with bed alarm set (waiting on echo and CT) Nurse Communication: Mobility status    Functional Assessment Tool Used: clinical judgement Functional Limitation: Mobility: Walking and moving around Mobility: Walking and Moving Around Current Status [id number]): At least 1 percent but less than 20 percent impaired, limited or restricted Mobility: Walking and Moving Around Goal Status [phone]): 0 percent impaired, limited or restricted    Time: 1430-1446 PT Time Calculation (min) (ACUTE ONLY): 16 min   Charges:   PT Evaluation $PT Eval Moderate Complexity: 1 Procedure     PT G Codes:   PT G-Codes **NOT FOR INPATIENT CLASS** Functional Assessment Tool Used: clinical judgement Functional Limitation: Mobility: Walking and moving around Mobility: Walking and Moving Around Current Status [id number]): At least 1 percent but less than 20 percent impaired, limited or restricted Mobility: Walking and Moving Around Goal Status [id number]): 0 percent impaired, limited or restricted    Amy [name] 09/12/2016, 4:39 PM Greggory Stallion, PT, DPT [phone]

## 2016-09-13 DIAGNOSIS — N179 Acute kidney failure, unspecified: Secondary | ICD-10-CM | POA: Diagnosis not present

## 2016-09-13 DIAGNOSIS — E860 Dehydration: Secondary | ICD-10-CM

## 2016-09-13 DIAGNOSIS — R630 Anorexia: Secondary | ICD-10-CM | POA: Diagnosis not present

## 2016-09-13 DIAGNOSIS — R55 Syncope and collapse: Secondary | ICD-10-CM | POA: Diagnosis not present

## 2016-09-13 DIAGNOSIS — I951 Orthostatic hypotension: Secondary | ICD-10-CM | POA: Diagnosis not present

## 2016-09-13 LAB — CREATININE, SERUM
CREATININE: 0.77 mg/dL (ref 0.44–1.00)
GFR calc Af Amer: >60 mL/min (ref >60)
GFR calc non Af Amer: >60 mL/min (ref >60)

## 2016-09-13 LAB — URINE CULTURE: Culture: NO GROWTH

## 2016-09-13 LAB — ECHOCARDIOGRAM COMPLETE
Height: 65 in
WEIGHTICAEL: 2080 [oz_av]

## 2016-09-13 LAB — HEMOGLOBIN: HEMOGLOBIN: 9.9 g/dL — AB (ref 12.0–16.0)

## 2016-09-13 MED ORDER — DRONABINOL 2.5 MG PO CAPS: 2.5000 mg | ORAL_CAPSULE | Freq: Two times a day (BID) | ORAL | Status: DC

## 2016-09-13 MED ORDER — ENSURE PLUS PO LIQD: 237.0000 mL | Freq: Three times a day (TID) | ORAL | 6 refills | Status: DC

## 2016-09-13 NOTE — Care Management Obs Status (Signed)
[place] NOTIFICATION   Patient Details  Name: Amy [name] MRN: [id number] Date of Birth: 12-29-1928   Medicare Observation Status Notification Given:  Yes    Jolly Mango, RN 09/13/2016, 9:50 AM

## 2016-09-13 NOTE — Care Management Note (Signed)
Case Management Note  Patient Details  Name: Amy [name] MRN: [id number] Date of Birth: 1929/12/06  Subjective/Objective:  Discharge home today                Action/Plan: [facility] notified of discharge.   Expected Discharge Date:   09/13/2016               Expected Discharge Plan:  [place]  In-House Referral:     Discharge planning Services     Post Acute Care Choice:  Home Health, Resumption of Svcs/PTA Provider Choice offered to:  Patient  DME Arranged:    DME Agency:     HH Arranged:  RN, PT, OT, Nurse's Aide [place] Agency:  [facility]  Status of Service:  Completed, signed off  If discussed at [place] of Stay Meetings, dates discussed:    Additional Comments:  Jolly Mango, RN 09/13/2016, 9:18 AM

## 2016-09-13 NOTE — Discharge Summary (Signed)
[facility] NAME: Amy [name]    MR#:  [id number]  DATE OF BIRTH:  04/04/1929  DATE OF ADMISSION:  09/11/2016 ADMITTING PHYSICIAN: Lance Coon, MD  DATE OF DISCHARGE: No discharge date for patient encounter.  PRIMARY CARE PHYSICIAN: Halina Maidens, MD     ADMISSION DIAGNOSIS:  Orthostatic hypotension [I95.1] Dehydration [E86.0] Nausea [R11.0] Syncope [R55] Trismus [R25.2]  DISCHARGE DIAGNOSIS:  Principal Problem:   Acute kidney failure (HCC) Active Problems:   Syncope   Orthostatic hypotension   Poor appetite   Dehydration   Polyneuropathy (HCC)   Benign essential HTN   Trismus   Mixed hyperlipidemia   SECONDARY DIAGNOSIS:   Past Medical History:  Diagnosis Date  . Arthritis   . Depression   . Diverticulitis   . DVT (deep venous thrombosis) (HCC)    Right leg   . Hyperlipidemia   . Hypertension   . Mitral valve regurgitation   . 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 Renal disorder     .pro HOSPITAL COURSE:   The patient is 82 year old Caucasian female who presents to the hospital with syncopal episode. . On arrival to the hospital, she was noted to be hypotensive, Orthostatic hypotension, also patient had  acute renal insufficiency. She was also reported to have very poor oral intake. The patient was rehydrated and her blood pressure improved. She underwent full syncope workup, including CT of head and the maxillofacial area, unremarkable. Ultrasound of kidneys was normal, except of bilateral renal cysts, nonobstructive calculus in the mid pole of right kidney measuring 3 mm, no hydronephrosis. CT of abdomen and pelvis revealed small bilateral pleural effusions, mild dependent atelectasis, hiatal hernia, previous cholecystectomy with chronic biliary ductal dilatation and change since 2015, 4 mm nonobstructing stone on the right, aortic atherosclerosis and no complications from prior gastrointestinal surgery.  Echocardiogram results are pending.  Discussion by problem: #1. . Syncope, due to orthostatic hypotension , patient received normal saline solution, blood pressure has improved. The  #2 Acute renal failure, due to dehydration, resolved on IV fluids #3. Poor appetite, anorexia, etiology is likely dementia, decreased thirst, appetite, the patient had  Volvulus repair surgery about 6 weeks ago, CT scan of abdomen was unremarkable #4. Orthostatic hypotension, resolved  DISCHARGE CONDITIONS:   Stable  CONSULTS OBTAINED:    DRUG ALLERGIES:   Allergies  Allergen Reactions  . Doxycycline Hyclate Nausea Only  . Levofloxacin Nausea And Vomiting  . Sulfa Antibiotics Rash    Other Reaction: Throat feels funny    DISCHARGE MEDICATIONS:   Current Discharge Medication List    START taking these medications   Details  ENSURE PLUS (ENSURE PLUS) LIQD Take 237 mLs by mouth 3 (three) times daily between meals. Qty: 90 Can, Refills: 6      CONTINUE these medications which have NOT CHANGED   Details  acetaminophen (TYLENOL) 500 MG tablet Take 500 mg by mouth every 4 (four) hours as needed.    aspirin EC 81 MG tablet Take 1 tablet by mouth daily.    cholecalciferol (VITAMIN D) 400 units TABS tablet Take 400 Units by mouth daily.    ciprofloxacin (CIPRO) 250 MG tablet Take 250 mg by mouth 2 (two) times daily.    cyclobenzaprine (FLEXERIL) 5 MG tablet Take 5-10 mg by mouth 3 (three) times daily as needed for muscle spasms.    DULoxetine (CYMBALTA) 60 MG capsule Take 60 mg by mouth daily.    losartan (COZAAR) 50  MG tablet Take 1 tablet by mouth daily.    Melatonin 5 MG TABS Take 1 tablet by mouth at bedtime.    Omega-3 Fatty Acids (FISH OIL) 1000 MG CAPS Take 2 capsules by mouth 2 (two) times daily.     pantoprazole (PROTONIX) 40 MG tablet Take 40 mg by mouth daily.    Polyvinyl Alcohol-Povidone (REFRESH OP) Apply 1-2 drops to eye as needed.    ranitidine (ZANTAC) 150 MG tablet Take 1  tablet by mouth 2 (two) times daily.    sucralfate (CARAFATE) 1 g tablet Take 1 g by mouth 4 (four) times daily -  with meals and at bedtime.    VYTORIN 10-20 MG tablet Take 1 tablet by mouth daily at 6 PM.  Refills: 1      STOP taking these medications     triamterene-hydrochlorothiazide (DYAZIDE) 37.5-25 MG capsule          DISCHARGE INSTRUCTIONS:    Patient is to follow-up with primary care physician as outpatient  If you experience worsening of your admission symptoms, develop shortness of breath, life threatening emergency, suicidal or homicidal thoughts you must seek medical attention immediately by calling 911 or calling your MD immediately  if symptoms less severe.  You Must read complete instructions/literature along with all the possible adverse reactions/side effects for all the Medicines you take and that have been prescribed to you. Take any new Medicines after you have completely understood and accept all the possible adverse reactions/side effects.   Please note  You were cared for by a hospitalist during your hospital stay. If you have any questions about your discharge medications or the care you received while you were in the hospital after you are discharged, you can call the unit and asked to speak with the hospitalist on call if the hospitalist that took care of you is not available. Once you are discharged, your primary care physician will handle any further medical issues. Please note that NO REFILLS for any discharge medications will be authorized once you are discharged, as it is imperative that you return to your primary care physician (or establish a relationship with a primary care physician if you do not have one) for your aftercare needs so that they can reassess your need for medications and monitor your lab values.    Today   CHIEF COMPLAINT:   Chief Complaint  Patient presents with  . Loss of Consciousness    HISTORY OF PRESENT ILLNESS:  Amy [name]  is a 82 y.o. female with a known history of Hypertension, hyperlipidemia, mitral valve regurgitation, who presents to the hospital with syncope episode.On arrival to the hospital, she was noted to be hypotensive, Orthostatic hypotension, also patient had  acute renal insufficiency. She was also reported to have very poor oral intake. The patient was rehydrated and her blood pressure improved. She underwent full syncope workup, including CT of head and the maxillofacial area, unremarkable. Ultrasound of kidneys was normal, except of bilateral renal cysts, nonobstructive calculus in the mid pole of right kidney measuring 3 mm, no hydronephrosis. CT of abdomen and pelvis revealed small bilateral pleural effusions, mild dependent atelectasis, hiatal hernia, previous cholecystectomy with chronic biliary ductal dilatation and change since 2015, 4 mm nonobstructing stone on the right, aortic atherosclerosis and no complications from prior gastrointestinal surgery. Echocardiogram results are pending.  Discussion by problem: #1. . Syncope, due to orthostatic hypotension , patient received normal saline solution, blood pressure has improved. The  #2 Acute  renal failure, due to dehydration, resolved on IV fluids #3. Poor appetite, anorexia, etiology is likely dementia, decreased thirst, appetite, the patient had  Volvulus repair surgery about 6 weeks ago, CT scan of abdomen was unremarkable #4. Orthostatic hypotension, resolved    VITAL SIGNS:  Blood pressure (!) 117/59, pulse 80, temperature 98.1 F (36.7 C), temperature source Oral, resp. rate 16, height 5\' 5"  (1.651 m), weight 59 kg (130 lb), SpO2 94 %.  I/O:   Intake/Output Summary (Last 24 hours) at 09/13/16 0855 Last data filed at 09/12/16 1900  Gross per 24 hour  Intake              240 ml  Output                0 ml  Net              240 ml    PHYSICAL EXAMINATION:  GENERAL:  82 y.o.-year-old patient lying in the bed with no acute  distress.  EYES: Pupils equal, round, reactive to light and accommodation. No scleral icterus. Extraocular muscles intact.  HEENT: Head atraumatic, normocephalic. Oropharynx and nasopharynx clear.  NECK:  Supple, no jugular venous distention. No thyroid enlargement, no tenderness.  LUNGS: Normal breath sounds bilaterally, no wheezing, rales,rhonchi or crepitation. No use of accessory muscles of respiration.  CARDIOVASCULAR: S1, S2 normal. No murmurs, rubs, or gallops.  ABDOMEN: Soft, non-tender, non-distended. Bowel sounds present. No organomegaly or mass.  EXTREMITIES: No pedal edema, cyanosis, or clubbing.  NEUROLOGIC: Cranial nerves II through XII are intact. Muscle strength 5/5 in all extremities. Sensation intact. Gait not checked.  PSYCHIATRIC: The patient is alert and oriented x 3.  SKIN: No obvious rash, lesion, or ulcer.   DATA REVIEW:   CBC  Recent Labs Lab 09/12/16 0527 09/13/16 0530  WBC 6.8  --   HGB 10.3* 9.9*  HCT 32.2*  --   PLT 228  --     Chemistries   Recent Labs Lab 09/11/16 1921 09/12/16 0527 09/13/16 0530  NA 136 136  --   K 4.2 3.8  --   CL 100* 103  --   CO2 29 27  --   GLUCOSE 151* 112*  --   BUN 36* 25*  --   CREATININE 1.53* 0.96 0.77  CALCIUM 9.2 8.5*  --   AST 28  --   --   ALT 16  --   --   ALKPHOS 68  --   --   BILITOT 0.4  --   --     Cardiac Enzymes  Recent Labs Lab 09/11/16 1921  TROPONINI <0.03    Microbiology Results  No results found for this or any previous visit.  RADIOLOGY:  Ct Head Wo Contrast  Result Date: 09/12/2016 CLINICAL DATA:  Syncopal episode. Weakness. Abdominal surgery 6 weeks ago with decreased appetite. EXAM: CT HEAD WITHOUT CONTRAST CT MAXILLOFACIAL WITHOUT CONTRAST TECHNIQUE: Multidetector CT imaging of the head and maxillofacial structures were performed using the standard protocol without intravenous contrast. Multiplanar CT image reconstructions of the maxillofacial structures were also generated.  COMPARISON:  None. FINDINGS: CT HEAD FINDINGS Brain: Diffuse cerebral atrophy. Ventricular dilatation consistent with central atrophy. Low-attenuation changes in the deep white matter consistent small vessel ischemia. Gray-white matter junctions are distinct. Basal cisterns are not effaced. No acute intracranial hemorrhage. No mass-effect or midline shift. Vascular: Atherosclerotic vascular calcifications are present. Skull: Normal. Negative for fracture or focal lesion. Postoperative changes with posterior fixation from  the skullbase to the upper cervical spine. Sinuses/Orbits: No acute finding. Other: None. CT MAXILLOFACIAL FINDINGS Osseous: Postoperative changes with old fracture deformity of C2. Posterior rod and screw fixation from the skullbase through C4. Orbital rims, maxillary antral walls, nasal bones, zygomatic arches, pterygoid plates, mandibles, and temporomandibular joints appear intact. No acute displaced fractures identified. Deformity of the mandibular heads bilaterally consistent with degenerative change. Degenerative changes in the cervical spine. Orbits: Negative. No traumatic or inflammatory finding. Sinuses: Clear. Soft tissues: Negative. Limited intracranial: See above. IMPRESSION: No acute intracranial abnormalities. Diffuse atrophy and small vessel ischemic changes. No acute displaced fractures in orbits or facial bones. Degenerative changes in the temporomandibular joints bilaterally. Postoperative changes in the upper cervical spine. Electronically Signed   By: Lucienne Capers M.D.   On: 09/12/2016 01:20   Ct Abdomen Pelvis W Contrast  Result Date: 09/12/2016 CLINICAL DATA:  Elderly patient with anorexia and syncopal episode. Renal insufficiency. Volvulus repair surgery 6 weeks ago. EXAM: CT ABDOMEN AND PELVIS WITH CONTRAST TECHNIQUE: Multidetector CT imaging of the abdomen and pelvis was performed using the standard protocol following bolus administration of intravenous contrast.  CONTRAST:  173mL ISOVUE-300 IOPAMIDOL (ISOVUE-300) INJECTION 61% COMPARISON:  03/29/2014 FINDINGS: Lower chest: Small bilateral pleural effusions with dependent pulmonary atelectasis. Hiatal hernia. Hepatobiliary: Previous cholecystectomy. Intra and extra hepatic biliary ductal dilatation, probably secondary to that, unchanged since 2015. Several scattered small liver cysts. No evidence of ductal stone or pancreatic head mass. Pancreas: Age related atrophy. Spleen: Normal.  Small accessory spleen. Adrenals/Urinary Tract: Adrenal glands are normal. Left kidney is normal except for a sub cm cyst. Right kidney is normal except for a nonobstructing 4 mm stone in the upper pole. Stomach/Bowel: No acute bowel finding. No evidence of volvulus. Diverticulosis without evidence of diverticulitis. Vascular/Lymphatic: Aortic atherosclerosis. No aneurysm. IVC is normal. No retroperitoneal mass or lymphadenopathy. Reproductive: Previous hysterectomy. Other: No ascites or free air.  No hernias. Musculoskeletal: Chronic degenerative changes of the lumbar spine and hips. IMPRESSION: Small bilateral pleural effusions layering dependently with mild dependent atelectasis. Hiatal hernia. Previous cholecystectomy with chronic biliary ductal dilatation, unchanged since 2015 and not likely obstructive. No significant renal finding. 4 mm nonobstructing stone on the right. Aortic atherosclerosis. No complications seen related to previous gastrointestinal surgery. Electronically Signed   By: Nelson Chimes M.D.   On: 09/12/2016 16:25   US Renal  Result Date: 09/12/2016 CLINICAL DATA:  Acute renal failure EXAM: RENAL / URINARY TRACT ULTRASOUND COMPLETE COMPARISON:  03/29/2014 FINDINGS: Right Kidney: Length: 8.9 cm. Normal echogenicity. Multiple tiny cysts are noted the largest measures 6 x 6 mm. No hydronephrosis. A nonobstructive calculus in midpole measures about 3 mm. Left Kidney: Length: 9.2 cm. Normal echogenicity. No hydronephrosis.  Few renal cysts are noted. The largest cyst in lower pole measures 1.4 x 1.4 cm. Bladder: Appears normal for degree of bladder distention. IMPRESSION: 1. No hydronephrosis. Bilateral renal cysts. Nonobstructive calculus midpole of the right kidney measures 3 mm. Unremarkable urinary bladder. Electronically Signed   By: Lahoma Crocker M.D.   On: 09/12/2016 11:55   Ct Maxillofacial Wo Contrast  Result Date: 09/12/2016 CLINICAL DATA:  Syncopal episode. Weakness. Abdominal surgery 6 weeks ago with decreased appetite. EXAM: CT HEAD WITHOUT CONTRAST CT MAXILLOFACIAL WITHOUT CONTRAST TECHNIQUE: Multidetector CT imaging of the head and maxillofacial structures were performed using the standard protocol without intravenous contrast. Multiplanar CT image reconstructions of the maxillofacial structures were also generated. COMPARISON:  None. FINDINGS: CT HEAD FINDINGS Brain: Diffuse cerebral atrophy.  Ventricular dilatation consistent with central atrophy. Low-attenuation changes in the deep white matter consistent small vessel ischemia. Gray-white matter junctions are distinct. Basal cisterns are not effaced. No acute intracranial hemorrhage. No mass-effect or midline shift. Vascular: Atherosclerotic vascular calcifications are present. Skull: Normal. Negative for fracture or focal lesion. Postoperative changes with posterior fixation from the skullbase to the upper cervical spine. Sinuses/Orbits: No acute finding. Other: None. CT MAXILLOFACIAL FINDINGS Osseous: Postoperative changes with old fracture deformity of C2. Posterior rod and screw fixation from the skullbase through C4. Orbital rims, maxillary antral walls, nasal bones, zygomatic arches, pterygoid plates, mandibles, and temporomandibular joints appear intact. No acute displaced fractures identified. Deformity of the mandibular heads bilaterally consistent with degenerative change. Degenerative changes in the cervical spine. Orbits: Negative. No traumatic or  inflammatory finding. Sinuses: Clear. Soft tissues: Negative. Limited intracranial: See above. IMPRESSION: No acute intracranial abnormalities. Diffuse atrophy and small vessel ischemic changes. No acute displaced fractures in orbits or facial bones. Degenerative changes in the temporomandibular joints bilaterally. Postoperative changes in the upper cervical spine. Electronically Signed   By: Lucienne Capers M.D.   On: 09/12/2016 01:20    EKG:   Orders placed or performed during the hospital encounter of 09/11/16  . EKG 12-Lead  . EKG 12-Lead  . ED EKG  . ED EKG  . EKG 12-Lead  . EKG 12-Lead      Management plans discussed with the patient, family and they are in agreement.  CODE STATUS:     Code Status Orders        Start     Ordered   09/12/16 0054  Full code  Continuous     09/12/16 0053    Code Status History    Date Active Date Inactive Code Status Order ID Comments User Context   09/12/2016 12:53 AM 09/12/2016  1:06 PM Full Code [id number]  Lance Coon, MD Inpatient    Advance Directive Documentation   Flowsheet Row Most Recent Value  Type of Advance Directive  Healthcare Power of Attorney  Pre-existing out of facility DNR order (yellow form or pink MOST form)  No data  "MOST" Form in Place?  No data      TOTAL TIME TAKING CARE OF THIS PATIENT: 40 minutes.    Theodoro Grist M.D on 09/13/2016 at 8:55 AM  Between 7am to 6pm - Pager - [phone]  After 6pm go to [email or web address] - password [email or web address] [facility]  Office  [phone]  CC: Primary care physician; Halina Maidens, MD

## 2016-09-13 NOTE — Progress Notes (Signed)
Pt being discharged home today. PIV has been removed. Discharge instructions reviewed with pt, and all questions were answered. Prescriptions will be sent to pt pharmacy. Pt will keep all scheduled appointments and follow up with primary as needed. She is leaving with all her belongings, will be transported home via family members.

## 2016-09-16 DIAGNOSIS — I129 Hypertensive chronic kidney disease with stage 1 through stage 4 chronic kidney disease, or unspecified chronic kidney disease: Secondary | ICD-10-CM | POA: Diagnosis not present

## 2016-09-16 DIAGNOSIS — Z09 Encounter for follow-up examination after completed treatment for conditions other than malignant neoplasm: Secondary | ICD-10-CM | POA: Diagnosis not present

## 2016-09-16 DIAGNOSIS — I2510 Atherosclerotic heart disease of native coronary artery without angina pectoris: Secondary | ICD-10-CM | POA: Diagnosis not present

## 2016-09-16 DIAGNOSIS — R6884 Jaw pain: Secondary | ICD-10-CM | POA: Diagnosis not present

## 2016-09-16 DIAGNOSIS — N189 Chronic kidney disease, unspecified: Secondary | ICD-10-CM | POA: Diagnosis not present

## 2016-09-16 DIAGNOSIS — Z48815 Encounter for surgical aftercare following surgery on the digestive system: Secondary | ICD-10-CM | POA: Diagnosis not present

## 2016-09-16 DIAGNOSIS — D72825 Bandemia: Secondary | ICD-10-CM | POA: Diagnosis not present

## 2016-09-16 DIAGNOSIS — F320 Major depressive disorder, single episode, mild: Secondary | ICD-10-CM | POA: Diagnosis not present

## 2016-09-16 DIAGNOSIS — Z9181 History of falling: Secondary | ICD-10-CM | POA: Diagnosis not present

## 2016-09-16 DIAGNOSIS — Z7982 Long term (current) use of aspirin: Secondary | ICD-10-CM | POA: Diagnosis not present

## 2016-09-17 DIAGNOSIS — I10 Essential (primary) hypertension: Secondary | ICD-10-CM | POA: Diagnosis not present

## 2016-09-17 DIAGNOSIS — E782 Mixed hyperlipidemia: Secondary | ICD-10-CM | POA: Diagnosis not present

## 2016-09-17 DIAGNOSIS — R6884 Jaw pain: Secondary | ICD-10-CM | POA: Diagnosis not present

## 2016-09-17 DIAGNOSIS — I340 Nonrheumatic mitral (valve) insufficiency: Secondary | ICD-10-CM | POA: Diagnosis not present

## 2016-09-17 DIAGNOSIS — I2510 Atherosclerotic heart disease of native coronary artery without angina pectoris: Secondary | ICD-10-CM | POA: Diagnosis not present

## 2016-09-18 DIAGNOSIS — N189 Chronic kidney disease, unspecified: Secondary | ICD-10-CM | POA: Diagnosis not present

## 2016-09-18 DIAGNOSIS — I2510 Atherosclerotic heart disease of native coronary artery without angina pectoris: Secondary | ICD-10-CM | POA: Diagnosis not present

## 2016-09-18 DIAGNOSIS — Z9181 History of falling: Secondary | ICD-10-CM | POA: Diagnosis not present

## 2016-09-18 DIAGNOSIS — Z48815 Encounter for surgical aftercare following surgery on the digestive system: Secondary | ICD-10-CM | POA: Diagnosis not present

## 2016-09-18 DIAGNOSIS — I129 Hypertensive chronic kidney disease with stage 1 through stage 4 chronic kidney disease, or unspecified chronic kidney disease: Secondary | ICD-10-CM | POA: Diagnosis not present

## 2016-09-18 DIAGNOSIS — Z7982 Long term (current) use of aspirin: Secondary | ICD-10-CM | POA: Diagnosis not present

## 2016-09-24 DIAGNOSIS — N189 Chronic kidney disease, unspecified: Secondary | ICD-10-CM | POA: Diagnosis not present

## 2016-09-24 DIAGNOSIS — I129 Hypertensive chronic kidney disease with stage 1 through stage 4 chronic kidney disease, or unspecified chronic kidney disease: Secondary | ICD-10-CM | POA: Diagnosis not present

## 2016-09-24 DIAGNOSIS — I2510 Atherosclerotic heart disease of native coronary artery without angina pectoris: Secondary | ICD-10-CM | POA: Diagnosis not present

## 2016-09-24 DIAGNOSIS — Z48815 Encounter for surgical aftercare following surgery on the digestive system: Secondary | ICD-10-CM | POA: Diagnosis not present

## 2016-09-24 DIAGNOSIS — Z9181 History of falling: Secondary | ICD-10-CM | POA: Diagnosis not present

## 2016-09-24 DIAGNOSIS — Z7982 Long term (current) use of aspirin: Secondary | ICD-10-CM | POA: Diagnosis not present

## 2016-09-26 DIAGNOSIS — I2510 Atherosclerotic heart disease of native coronary artery without angina pectoris: Secondary | ICD-10-CM | POA: Diagnosis not present

## 2016-09-26 DIAGNOSIS — Z9181 History of falling: Secondary | ICD-10-CM | POA: Diagnosis not present

## 2016-09-26 DIAGNOSIS — I129 Hypertensive chronic kidney disease with stage 1 through stage 4 chronic kidney disease, or unspecified chronic kidney disease: Secondary | ICD-10-CM | POA: Diagnosis not present

## 2016-09-26 DIAGNOSIS — N189 Chronic kidney disease, unspecified: Secondary | ICD-10-CM | POA: Diagnosis not present

## 2016-09-26 DIAGNOSIS — Z48815 Encounter for surgical aftercare following surgery on the digestive system: Secondary | ICD-10-CM | POA: Diagnosis not present

## 2016-09-26 DIAGNOSIS — Z7982 Long term (current) use of aspirin: Secondary | ICD-10-CM | POA: Diagnosis not present

## 2016-09-30 DIAGNOSIS — N189 Chronic kidney disease, unspecified: Secondary | ICD-10-CM | POA: Diagnosis not present

## 2016-09-30 DIAGNOSIS — Z9181 History of falling: Secondary | ICD-10-CM | POA: Diagnosis not present

## 2016-09-30 DIAGNOSIS — I2510 Atherosclerotic heart disease of native coronary artery without angina pectoris: Secondary | ICD-10-CM | POA: Diagnosis not present

## 2016-09-30 DIAGNOSIS — Z7982 Long term (current) use of aspirin: Secondary | ICD-10-CM | POA: Diagnosis not present

## 2016-09-30 DIAGNOSIS — Z48815 Encounter for surgical aftercare following surgery on the digestive system: Secondary | ICD-10-CM | POA: Diagnosis not present

## 2016-09-30 DIAGNOSIS — I129 Hypertensive chronic kidney disease with stage 1 through stage 4 chronic kidney disease, or unspecified chronic kidney disease: Secondary | ICD-10-CM | POA: Diagnosis not present

## 2016-10-02 DIAGNOSIS — I2510 Atherosclerotic heart disease of native coronary artery without angina pectoris: Secondary | ICD-10-CM | POA: Diagnosis not present

## 2016-10-02 DIAGNOSIS — R6884 Jaw pain: Secondary | ICD-10-CM | POA: Diagnosis not present

## 2016-10-02 DIAGNOSIS — N189 Chronic kidney disease, unspecified: Secondary | ICD-10-CM | POA: Diagnosis not present

## 2016-10-02 DIAGNOSIS — Z7982 Long term (current) use of aspirin: Secondary | ICD-10-CM | POA: Diagnosis not present

## 2016-10-02 DIAGNOSIS — Z48815 Encounter for surgical aftercare following surgery on the digestive system: Secondary | ICD-10-CM | POA: Diagnosis not present

## 2016-10-02 DIAGNOSIS — Z9181 History of falling: Secondary | ICD-10-CM | POA: Diagnosis not present

## 2016-10-02 DIAGNOSIS — I129 Hypertensive chronic kidney disease with stage 1 through stage 4 chronic kidney disease, or unspecified chronic kidney disease: Secondary | ICD-10-CM | POA: Diagnosis not present

## 2016-10-09 ENCOUNTER — Encounter (INDEPENDENT_AMBULATORY_CARE_PROVIDER_SITE_OTHER): Payer: Medicare Other | Admitting: Ophthalmology

## 2016-10-09 DIAGNOSIS — H43813 Vitreous degeneration, bilateral: Secondary | ICD-10-CM

## 2016-10-09 DIAGNOSIS — H353231 Exudative age-related macular degeneration, bilateral, with active choroidal neovascularization: Secondary | ICD-10-CM | POA: Diagnosis not present

## 2016-10-09 DIAGNOSIS — H35033 Hypertensive retinopathy, bilateral: Secondary | ICD-10-CM | POA: Diagnosis not present

## 2016-10-09 DIAGNOSIS — I10 Essential (primary) hypertension: Secondary | ICD-10-CM | POA: Diagnosis not present

## 2016-10-11 DIAGNOSIS — Z23 Encounter for immunization: Secondary | ICD-10-CM | POA: Diagnosis not present

## 2016-10-29 DIAGNOSIS — E782 Mixed hyperlipidemia: Secondary | ICD-10-CM | POA: Diagnosis not present

## 2016-10-29 DIAGNOSIS — I2510 Atherosclerotic heart disease of native coronary artery without angina pectoris: Secondary | ICD-10-CM | POA: Diagnosis not present

## 2016-10-29 DIAGNOSIS — I340 Nonrheumatic mitral (valve) insufficiency: Secondary | ICD-10-CM | POA: Diagnosis not present

## 2016-10-29 DIAGNOSIS — I10 Essential (primary) hypertension: Secondary | ICD-10-CM | POA: Diagnosis not present

## 2016-11-07 ENCOUNTER — Encounter (INDEPENDENT_AMBULATORY_CARE_PROVIDER_SITE_OTHER): Payer: Medicare Other | Admitting: Ophthalmology

## 2016-11-07 DIAGNOSIS — H35033 Hypertensive retinopathy, bilateral: Secondary | ICD-10-CM

## 2016-11-07 DIAGNOSIS — I10 Essential (primary) hypertension: Secondary | ICD-10-CM | POA: Diagnosis not present

## 2016-11-07 DIAGNOSIS — H353231 Exudative age-related macular degeneration, bilateral, with active choroidal neovascularization: Secondary | ICD-10-CM | POA: Diagnosis not present

## 2016-11-07 DIAGNOSIS — H43813 Vitreous degeneration, bilateral: Secondary | ICD-10-CM

## 2016-11-12 DIAGNOSIS — L814 Other melanin hyperpigmentation: Secondary | ICD-10-CM | POA: Diagnosis not present

## 2016-11-12 DIAGNOSIS — Q828 Other specified congenital malformations of skin: Secondary | ICD-10-CM | POA: Diagnosis not present

## 2016-11-15 ENCOUNTER — Ambulatory Visit
Admission: EM | Admit: 2016-11-15 | Discharge: 2016-11-15 | Disposition: A | Discharge status: Home / Self Care | Payer: Medicare Other | Attending: Family Medicine | Admitting: Family Medicine

## 2016-11-15 ENCOUNTER — Encounter: Payer: Self-pay | Admitting: Emergency Medicine

## 2016-11-15 DIAGNOSIS — J40 Bronchitis, not specified as acute or chronic: Secondary | ICD-10-CM | POA: Diagnosis not present

## 2016-11-15 DIAGNOSIS — J069 Acute upper respiratory infection, unspecified: Secondary | ICD-10-CM | POA: Diagnosis not present

## 2016-11-15 MED ORDER — AZITHROMYCIN 250 MG PO TABS: 250.0000 mg | ORAL_TABLET | Freq: Every day | ORAL | 0 refills | Status: DC

## 2016-11-15 MED ORDER — GUAIFENESIN-CODEINE 100-10 MG/5ML PO SYRP: 5.0000 mL | ORAL_SOLUTION | Freq: Three times a day (TID) | ORAL | 0 refills | Status: DC | PRN

## 2016-11-15 NOTE — ED Provider Notes (Signed)
[facility]    CSN: [id number] Arrival date & time: 11/15/16  1434     History   Chief Complaint Chief Complaint  Patient presents with  . Cough    HPI Amy [name] is a 82 y.o. female.   The history is provided by the patient.  Cough  Cough characteristics:  Productive Sputum characteristics:  Yellow Severity:  Mild Timing:  Intermittent Progression:  Waxing and waning Smoker: no   Context: upper respiratory infection   Context: not animal exposure, not exposure to allergens and not sick contacts   Relieved by:  Nothing Worsened by:  Nothing Ineffective treatments:  None tried Associated symptoms: no chest pain, no chills, no diaphoresis, no ear fullness, no ear pain, no eye discharge, no fever, no headaches, no myalgias, no rash, no rhinorrhea, no shortness of breath, no sinus congestion, no sore throat, no weight loss and no wheezing   URI  Presenting symptoms: congestion, cough and fatigue   Presenting symptoms: no ear pain, no fever, no rhinorrhea and no sore throat   Congestion:    Location:  Nasal Associated symptoms: no headaches, no myalgias, no sinus pain and no wheezing     Past Medical History:  Diagnosis Date  . Arthritis   . Depression   . Diverticulitis   . DVT (deep venous thrombosis) (HCC)    Right leg   . Hyperlipidemia   . Hypertension   . Mitral valve regurgitation   . 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 Renal disorder     Patient Active Problem List   Diagnosis Date Noted  . Orthostatic hypotension 09/13/2016  . Dehydration 09/13/2016  . Trismus 09/12/2016  . Syncope 09/11/2016  . Poor appetite 09/11/2016  . Atherosclerosis of native coronary artery of native heart without angina pectoris 05/13/2016  . Hiatal hernia without gangrene and obstruction 12/01/2015  . Herpes zona 11/25/2015  . Hx of deep vein thrombophlebitis of lower extremity 11/25/2015  . Senile ecchymosis 11/14/2015  . Mixed hyperlipidemia 11/14/2015  . Acute  kidney failure ([place]) 11/09/2015  . Arthritis, degenerative 11/09/2015  . Cervical spinal cord compression ([place]) 11/09/2015  . Osteoporosis, post-menopausal 11/09/2015  . Female genuine stress incontinence 11/09/2015  . Polyneuropathy ([place]) 09/30/2014  . Benign essential HTN 06/08/2014  . MI (mitral incompetence) 06/08/2014  . 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 ABDOMINAL SURGERY    . bladder tack  1990  . BREAST BIOPSY    . CATARACT EXTRACTION, BILATERAL    . HERNIA REPAIR    . plasmacytoma resection  2005   lumbar    OB History    No data available       Home Medications    Prior to Admission medications   Medication Sig Start Date End Date Taking? Authorizing Provider  acetaminophen (TYLENOL) 500 MG tablet Take 500 mg by mouth every 4 (four) hours as needed.    Historical Provider, MD  aspirin EC 81 MG tablet Take 1 tablet by mouth daily.    Historical Provider, MD  azithromycin (ZITHROMAX) 250 MG tablet Take 1 tablet (250 mg total) by mouth daily. Take first 2 tablets together, then 1 every day until finished. 11/15/16   Juline Patch, MD  cholecalciferol (VITAMIN D) 400 units TABS tablet Take 400 Units by mouth daily.    Historical Provider, MD  cyclobenzaprine (FLEXERIL) 5 MG tablet Take 5-10 mg by mouth 3 (three) times daily as needed for muscle  spasms.    Historical Provider, MD  DULoxetine (CYMBALTA) 60 MG capsule Take 60 mg by mouth daily.    Historical Provider, MD  ENSURE PLUS (ENSURE PLUS) LIQD Take 237 mLs by mouth 3 (three) times daily between meals. 09/13/16   Theodoro Grist, MD  guaiFENesin-codeine (ROBITUSSIN AC) 100-10 MG/5ML syrup Take 5 mLs by mouth 3 (three) times daily as needed for cough. 11/15/16   Juline Patch, MD  Melatonin 5 MG TABS Take 1 tablet by mouth at bedtime.    Historical Provider, MD  Omega-3 Fatty Acids (FISH OIL) 1000 MG CAPS Take 2 capsules by mouth 2 (two) times daily.     Historical  Provider, MD  pantoprazole (PROTONIX) 40 MG tablet Take 40 mg by mouth daily.    Historical Provider, MD  Polyvinyl Alcohol-Povidone (REFRESH OP) Apply 1-2 drops to eye as needed.    Historical Provider, MD  ranitidine (ZANTAC) 150 MG tablet Take 1 tablet by mouth 2 (two) times daily. 12/27/14   Historical Provider, MD  sucralfate (CARAFATE) 1 g tablet Take 1 g by mouth 4 (four) times daily -  with meals and at bedtime.    Historical Provider, MD  VYTORIN 10-20 MG tablet Take 1 tablet by mouth daily at 6 PM.  10/23/15   Historical Provider, MD    Family History Family History  Problem Relation Age of Onset  . Heart failure Mother   . Heart failure Father     Social History Social History  Substance Use Topics  . Smoking status: Never Smoker  . Smokeless tobacco: Never Used  . Alcohol use No     Allergies   Doxycycline hyclate; Levofloxacin; and Sulfa antibiotics   Review of Systems Review of Systems  Constitutional: Positive for fatigue. Negative for appetite change, chills, diaphoresis, fever, unexpected weight change and weight loss.  HENT: Positive for congestion, postnasal drip and sinus pressure. Negative for ear discharge, ear pain, facial swelling, hearing loss, mouth sores, nosebleeds, rhinorrhea, sinus pain and sore throat.   Eyes: Negative for discharge and redness.  Respiratory: Positive for cough. Negative for apnea, chest tightness, shortness of breath, wheezing and stridor.   Cardiovascular: Negative for chest pain and palpitations.  Gastrointestinal: Negative for abdominal distention and abdominal pain.  Musculoskeletal: Negative.  Negative for myalgias.  Skin: Negative for rash.  Neurological: Negative for headaches.     Physical Exam Triage Vital Signs ED Triage Vitals  Enc Vitals Group     BP 11/15/16 1624 (!) 146/107     Pulse Rate 11/15/16 1624 100     Resp 11/15/16 1624 17     Temp 11/15/16 1624 98.7 F (37.1 C)     Temp Source 11/15/16 1624 Oral       SpO2 11/15/16 1624 97 %     Weight 11/15/16 1623 130 lb (59 kg)     Height 11/15/16 1623 5\' 4"  (1.626 m)     Head Circumference --      Peak Flow --      Pain Score 11/15/16 1625 4     Pain Loc --      Pain Edu? --      Excl. in [place]? --    No data found.   Updated Vital Signs BP (!) 146/107   Pulse 100   Temp 98.7 F (37.1 C) (Oral)   Resp 17   Ht 5\' 4"  (1.626 m)   Wt 130 lb (59 kg)   SpO2 97%   BMI  22.31 kg/m   Visual Acuity Right Eye Distance:   Left Eye Distance:   Bilateral Distance:    Right Eye Near:   Left Eye Near:    Bilateral Near:     Physical Exam  Constitutional: No distress.  HENT:  Head: Normocephalic and atraumatic.  Right Ear: Hearing, tympanic membrane, external ear and ear canal normal.  Left Ear: Hearing, tympanic membrane, external ear and ear canal normal.  Nose: Nose normal. Right sinus exhibits no maxillary sinus tenderness and no frontal sinus tenderness. Left sinus exhibits no maxillary sinus tenderness and no frontal sinus tenderness.  Mouth/Throat: Uvula is midline and oropharynx is clear and moist. No oropharyngeal exudate, posterior oropharyngeal edema or posterior oropharyngeal erythema.  Eyes: Conjunctivae and EOM are normal. Pupils are equal, round, and reactive to light. Right eye exhibits no discharge. Left eye exhibits no discharge.  Neck: Normal range of motion. Neck supple. No JVD present. No thyromegaly present.  Cardiovascular: Normal rate, regular rhythm, normal heart sounds and intact distal pulses.  Exam reveals no gallop and no friction rub.   No murmur heard. Pulmonary/Chest: Effort normal and breath sounds normal. She has no decreased breath sounds.  Abdominal: Soft. Bowel sounds are normal. She exhibits no mass. There is no tenderness. There is no guarding.  Musculoskeletal: Normal range of motion. She exhibits no edema.  Lymphadenopathy:    She has no cervical adenopathy.  Neurological: She is alert. She has normal  reflexes.  Skin: Skin is warm and dry. She is not diaphoretic.  Nursing note and vitals reviewed.    UC Treatments / Results  Labs (all labs ordered are listed, but only abnormal results are displayed) Labs Reviewed - No data to display  EKG  EKG Interpretation None       Radiology No results found.  Procedures Procedures (including critical care time)  Medications Ordered in UC Medications - No data to display   Initial Impression / Assessment and Plan / UC Course  I have reviewed the triage vital signs and the nursing notes.  Pertinent labs & imaging results that were available during my care of the patient were reviewed by me and considered in my medical decision making (see chart for details).  Clinical Course       Final Clinical Impressions(s) / UC Diagnoses   Final diagnoses:  Bronchitis  Upper respiratory tract infection, unspecified type    New Prescriptions New Prescriptions   AZITHROMYCIN (ZITHROMAX) 250 MG TABLET    Take 1 tablet (250 mg total) by mouth daily. Take first 2 tablets together, then 1 every day until finished.   GUAIFENESIN-CODEINE (ROBITUSSIN AC) 100-10 MG/5ML SYRUP    Take 5 mLs by mouth 3 (three) times daily as needed for cough.     Juline Patch, MD 11/15/16 [phone]

## 2016-11-15 NOTE — ED Triage Notes (Signed)
Patient c/o cough and chest congestion and runny nose since Sunday.

## 2016-11-18 ENCOUNTER — Telehealth: Payer: Self-pay

## 2016-11-18 DIAGNOSIS — Z862 Personal history of diseases of the blood and blood-forming organs and certain disorders involving the immune mechanism: Secondary | ICD-10-CM | POA: Diagnosis not present

## 2016-11-18 DIAGNOSIS — F320 Major depressive disorder, single episode, mild: Secondary | ICD-10-CM | POA: Diagnosis not present

## 2016-11-18 DIAGNOSIS — Z79899 Other long term (current) drug therapy: Secondary | ICD-10-CM | POA: Diagnosis not present

## 2016-11-18 DIAGNOSIS — I4949 Other premature depolarization: Secondary | ICD-10-CM | POA: Diagnosis not present

## 2016-11-18 DIAGNOSIS — Z86718 Personal history of other venous thrombosis and embolism: Secondary | ICD-10-CM | POA: Diagnosis not present

## 2016-11-18 NOTE — Telephone Encounter (Signed)
Courtesy call back completed today for patient's recent visit at [facility]. Patient did not answer, no answering machine. Patient will call back if any questions or concerns. [facility]

## 2016-11-29 DIAGNOSIS — E875 Hyperkalemia: Secondary | ICD-10-CM | POA: Diagnosis not present

## 2016-12-05 ENCOUNTER — Encounter (INDEPENDENT_AMBULATORY_CARE_PROVIDER_SITE_OTHER): Payer: Medicare Other | Admitting: Ophthalmology

## 2016-12-05 DIAGNOSIS — H353231 Exudative age-related macular degeneration, bilateral, with active choroidal neovascularization: Secondary | ICD-10-CM

## 2016-12-05 DIAGNOSIS — H43813 Vitreous degeneration, bilateral: Secondary | ICD-10-CM | POA: Diagnosis not present

## 2016-12-05 DIAGNOSIS — H35033 Hypertensive retinopathy, bilateral: Secondary | ICD-10-CM

## 2016-12-05 DIAGNOSIS — I10 Essential (primary) hypertension: Secondary | ICD-10-CM

## 2016-12-10 DIAGNOSIS — E876 Hypokalemia: Secondary | ICD-10-CM | POA: Diagnosis not present

## 2016-12-17 ENCOUNTER — Emergency Department
Admission: EM | Admit: 2016-12-17 | Discharge: 2016-12-17 | Disposition: A | Discharge status: Home / Self Care | Payer: Medicare Other | Attending: Emergency Medicine | Admitting: Emergency Medicine

## 2016-12-17 ENCOUNTER — Encounter: Payer: Self-pay | Admitting: Emergency Medicine

## 2016-12-17 ENCOUNTER — Emergency Department: Payer: Medicare Other

## 2016-12-17 DIAGNOSIS — I10 Essential (primary) hypertension: Secondary | ICD-10-CM | POA: Insufficient documentation

## 2016-12-17 DIAGNOSIS — J209 Acute bronchitis, unspecified: Secondary | ICD-10-CM

## 2016-12-17 DIAGNOSIS — R0602 Shortness of breath: Secondary | ICD-10-CM | POA: Diagnosis not present

## 2016-12-17 DIAGNOSIS — I509 Heart failure, unspecified: Secondary | ICD-10-CM | POA: Diagnosis not present

## 2016-12-17 DIAGNOSIS — Z7982 Long term (current) use of aspirin: Secondary | ICD-10-CM | POA: Insufficient documentation

## 2016-12-17 DIAGNOSIS — Z79899 Other long term (current) drug therapy: Secondary | ICD-10-CM | POA: Diagnosis not present

## 2016-12-17 DIAGNOSIS — J9801 Acute bronchospasm: Secondary | ICD-10-CM | POA: Diagnosis not present

## 2016-12-17 LAB — CBC WITH DIFFERENTIAL/PLATELET
Basophils Absolute: 0.1 10*3/uL (ref 0–0.1)
Basophils Relative: 1 %
Eosinophils Absolute: 0.1 10*3/uL (ref 0–0.7)
Eosinophils Relative: 1 %
HEMATOCRIT: 39.7 % (ref 35.0–47.0)
HEMOGLOBIN: 12.5 g/dL (ref 12.0–16.0)
LYMPHS ABS: 1.9 10*3/uL (ref 1.0–3.6)
LYMPHS PCT: 26 %
MCH: 22.8 pg — ABNORMAL LOW (ref 26.0–34.0)
MCHC: 31.6 g/dL — AB (ref 32.0–36.0)
MCV: 72.3 fL — AB (ref 80.0–100.0)
MONO ABS: 0.4 10*3/uL (ref 0.2–0.9)
MONOS PCT: 6 %
NEUTROS ABS: 4.8 10*3/uL (ref 1.4–6.5)
Neutrophils Relative %: 66 %
Platelets: 168 10*3/uL (ref 150–440)
RBC: 5.49 MIL/uL — ABNORMAL HIGH (ref 3.80–5.20)
RDW: 18.5 % — AB (ref 11.5–14.5)
WBC: 7.3 10*3/uL (ref 3.6–11.0)

## 2016-12-17 LAB — BASIC METABOLIC PANEL
ANION GAP: 9 (ref 5–15)
BUN: 17 mg/dL (ref 6–20)
CHLORIDE: 108 mmol/L (ref 101–111)
CO2: 27 mmol/L (ref 22–32)
Calcium: 9.2 mg/dL (ref 8.9–10.3)
Creatinine, Ser: 0.57 mg/dL (ref 0.44–1.00)
GFR calc Af Amer: >60 mL/min (ref >60)
GFR calc non Af Amer: >60 mL/min (ref >60)
GLUCOSE: 125 mg/dL — AB (ref 65–99)
POTASSIUM: 3.8 mmol/L (ref 3.5–5.1)
Sodium: 144 mmol/L (ref 135–145)

## 2016-12-17 LAB — INFLUENZA PANEL BY PCR (TYPE A & B)
Influenza A By PCR: NEGATIVE
Influenza B By PCR: NEGATIVE

## 2016-12-17 LAB — TROPONIN I
TROPONIN I: 0.04 ng/mL — AB (ref <0.03)
Troponin I: 0.04 ng/mL (ref <0.03)

## 2016-12-17 MED ORDER — IOPAMIDOL (ISOVUE-370) INJECTION 76%
75.0000 mL | Freq: Once | INTRAVENOUS | Status: AC | PRN
  Administered 2016-12-17: 75 mL via INTRAVENOUS

## 2016-12-17 MED ORDER — PREDNISONE 10 MG PO TABS: ORAL_TABLET | ORAL | 0 refills | Status: DC

## 2016-12-17 MED ORDER — ALBUTEROL SULFATE HFA 108 (90 BASE) MCG/ACT IN AERS: 2.0000 | INHALATION_SPRAY | Freq: Four times a day (QID) | RESPIRATORY_TRACT | 0 refills | Status: DC | PRN

## 2016-12-17 MED ORDER — IPRATROPIUM-ALBUTEROL 0.5-2.5 (3) MG/3ML IN SOLN
3.0000 mL | Freq: Once | RESPIRATORY_TRACT | Status: AC
  Administered 2016-12-17: 3 mL via RESPIRATORY_TRACT
  Filled 2016-12-17: qty 3

## 2016-12-17 NOTE — ED Notes (Signed)
Pt taken to xray prior to breathing tx.

## 2016-12-17 NOTE — ED Notes (Signed)
Second troponin collected and sent to lab.

## 2016-12-17 NOTE — ED Triage Notes (Signed)
Pt to ed via [facility] from [facility] with reports of shortness of breath for three weeks. Ems reports all vss. No distress on arrival to ed. Pt a/ox3.

## 2016-12-17 NOTE — ED Provider Notes (Signed)
[facility] Emergency Department Provider Note ____________________________________________   I have reviewed the triage vital signs and the triage nursing note.  HISTORY  Chief Complaint Shortness of Breath   Historian Patient  HPI Amy [name] is a 82 y.o. female without a history of asthma or COPD, presents with about 2 weeks of shortness of breath. She states that she saw someone couple weeks ago with torsion of bronchitis was given a cough medicine. She is continued to have shortness of breath. No chest pain. No productive cough. No chest pain or palpitations or dizziness or confusion or altered mental status.  She did have a low extremity DVTunprovoked several years ago was on 2 years of blood thinner, is no longer currently on that. She does not report any increased lower extremity edema or swelling or pain.    Past Medical History:  Diagnosis Date  . Arthritis   . Depression   . Diverticulitis   . DVT (deep venous thrombosis) (HCC)    Right leg   . Hyperlipidemia   . Hypertension   . Mitral valve regurgitation   . 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 Renal disorder     Patient Active Problem List   Diagnosis Date Noted  . Orthostatic hypotension 09/13/2016  . Dehydration 09/13/2016  . Trismus 09/12/2016  . Syncope 09/11/2016  . Poor appetite 09/11/2016  . Atherosclerosis of native coronary artery of native heart without angina pectoris 05/13/2016  . Hiatal hernia without gangrene and obstruction 12/01/2015  . Herpes zona 11/25/2015  . Hx of deep vein thrombophlebitis of lower extremity 11/25/2015  . Senile ecchymosis 11/14/2015  . Mixed hyperlipidemia 11/14/2015  . Acute kidney failure ([place]) 11/09/2015  . Arthritis, degenerative 11/09/2015  . Cervical spinal cord compression ([place]) 11/09/2015  . Osteoporosis, post-menopausal 11/09/2015  . Female genuine stress incontinence 11/09/2015  . Polyneuropathy ([place]) 09/30/2014  . Benign essential HTN  06/08/2014  . MI (mitral incompetence) 06/08/2014  . 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 ABDOMINAL SURGERY    . bladder tack  1990  . BREAST BIOPSY    . CATARACT EXTRACTION, BILATERAL    . HERNIA REPAIR    . plasmacytoma resection  2005   lumbar    Prior to Admission medications   Medication Sig Start Date End Date Taking? Authorizing Provider  acetaminophen (TYLENOL) 500 MG tablet Take 500 mg by mouth every 4 (four) hours as needed.    Historical Provider, MD  albuterol (PROVENTIL HFA;VENTOLIN HFA) 108 (90 Base) MCG/ACT inhaler Inhale 2 puffs into the lungs every 6 (six) hours as needed for wheezing or shortness of breath. 12/17/16   Lisa Roca, MD  aspirin EC 81 MG tablet Take 1 tablet by mouth daily.    Historical Provider, MD  azithromycin (ZITHROMAX) 250 MG tablet Take 1 tablet (250 mg total) by mouth daily. Take first 2 tablets together, then 1 every day until finished. 11/15/16   Juline Patch, MD  cholecalciferol (VITAMIN D) 400 units TABS tablet Take 400 Units by mouth daily.    Historical Provider, MD  cyclobenzaprine (FLEXERIL) 5 MG tablet Take 5-10 mg by mouth 3 (three) times daily as needed for muscle spasms.    Historical Provider, MD  DULoxetine (CYMBALTA) 60 MG capsule Take 60 mg by mouth daily.    Historical Provider, MD  ENSURE PLUS (ENSURE PLUS) LIQD Take 237 mLs by mouth 3 (three) times daily between meals. 09/13/16   Rima  Ether Griffins, MD  guaiFENesin-codeine (ROBITUSSIN AC) 100-10 MG/5ML syrup Take 5 mLs by mouth 3 (three) times daily as needed for cough. 11/15/16   Juline Patch, MD  Melatonin 5 MG TABS Take 1 tablet by mouth at bedtime.    Historical Provider, MD  Omega-3 Fatty Acids (FISH OIL) 1000 MG CAPS Take 2 capsules by mouth 2 (two) times daily.     Historical Provider, MD  pantoprazole (PROTONIX) 40 MG tablet Take 40 mg by mouth daily.    Historical Provider, MD  Polyvinyl Alcohol-Povidone (REFRESH OP)  Apply 1-2 drops to eye as needed.    Historical Provider, MD  predniSONE (DELTASONE) 10 MG tablet 50 mg by mouth for 2 days 40 mg by mouth for 2 days 30 mg by mouth 4 2 days 20 mg by mouth for 2 days 10 mg by mouth for 2 days 12/17/16   Lisa Roca, MD  ranitidine (ZANTAC) 150 MG tablet Take 1 tablet by mouth 2 (two) times daily. 12/27/14   Historical Provider, MD  sucralfate (CARAFATE) 1 g tablet Take 1 g by mouth 4 (four) times daily -  with meals and at bedtime.    Historical Provider, MD  VYTORIN 10-20 MG tablet Take 1 tablet by mouth daily at 6 PM.  10/23/15   Historical Provider, MD    Allergies  Allergen Reactions  . Doxycycline Hyclate Nausea Only  . Levofloxacin Nausea And Vomiting  . Sulfa Antibiotics Rash    Other Reaction: Throat feels funny    Family History  Problem Relation Age of Onset  . Heart failure Mother   . Heart failure Father     Social History Social History  Substance Use Topics  . Smoking status: Never Smoker  . Smokeless tobacco: Never Used  . Alcohol use No    Review of Systems  Constitutional: Negative for fever. Eyes: Negative for visual changes. ENT: Negative for sore throat. Cardiovascular: Negative for chest pain. Respiratory: Positive for shortness of breath, worse with exertion. Gastrointestinal: Negative for abdominal pain, vomiting and diarrhea. Genitourinary: Negative for dysuria. Musculoskeletal: Negative for back pain. Skin: Negative for rash. Neurological: Negative for headache. 10 point Review of Systems otherwise negative ____________________________________________   PHYSICAL EXAM:  VITAL SIGNS: ED Triage Vitals [12/17/16 1143]  Enc Vitals Group     BP (!) 144/88     Pulse Rate (!) 103     Resp (!) 23     Temp 98.8 F (37.1 C)     Temp Source Oral     SpO2 94 %     Weight 135 lb (61.2 kg)     Height 5\' 5"  (1.651 m)     Head Circumference      Peak Flow      Pain Score      Pain Loc      Pain Edu?       Excl. in [place]?      Constitutional: Alert and oriented. Well appearing and in no distress. HEENT   Head: Normocephalic and atraumatic.      Eyes: Conjunctivae are normal. PERRL. Normal extraocular movements.      Ears:         Nose: No congestion/rhinnorhea.   Mouth/Throat: Mucous membranes are moist.   Neck: No stridor. Cardiovascular/Chest: Normal rate, regular rhythm.  No murmurs, rubs, or gallops. Respiratory: Normal respiratory effort without tachypnea nor retractions. Breath sounds are clear and equal bilaterally. No wheezes/rales/rhonchi.  Somewhat tight breath sounds without clear wheezing. Gastrointestinal:  Soft. No distention, no guarding, no rebound. Nontender.    Genitourinary/rectal:Deferred Musculoskeletal: Nontender with normal range of motion in all extremities. No joint effusions.  No lower extremity tenderness.  No edema. Neurologic:  Normal speech and language. No gross or focal neurologic deficits are appreciated. Skin:  Skin is warm, dry and intact. No rash noted. Psychiatric: Mood and affect are normal. Speech and behavior are normal. Patient exhibits appropriate insight and judgment.   ____________________________________________  LABS (pertinent positives/negatives)  Labs Reviewed  CBC WITH DIFFERENTIAL/PLATELET - Abnormal; Notable for the following:       Result Value   RBC 5.49 (*)    MCV 72.3 (*)    MCH 22.8 (*)    MCHC 31.6 (*)    RDW 18.5 (*)    All other components within normal limits  INFLUENZA PANEL BY PCR (TYPE A & B, [id number])  BASIC METABOLIC PANEL  TROPONIN I    ____________________________________________    EKG I, Lisa Roca, MD, the attending physician have personally viewed and interpreted all ECGs.  108 bpm. Sinus tachycardia. Narrow QS. No axis. Nonspecific ST and T-wave. ____________________________________________  RADIOLOGY All Xrays were viewed by me. Imaging interpreted by Radiologist.  Chest x-ray two-view:   IMPRESSION: No convincing pulmonary edema. Bilateral small pleural effusion with bilateral basilar atelectasis or infiltrate. __________________________________________  PROCEDURES  Procedure(s) performed: None  Critical Care performed: None  ____________________________________________   ED COURSE / ASSESSMENT AND PLAN  Pertinent labs & imaging results that were available during my care of the patient were reviewed by me and considered in my medical decision making (see chart for details).   Ms. Schemenauer is here for shortness of breath in association with a diagnosis of bronchitis several weeks ago, but persistent symptoms. On exam her O2 sat is in the mid 90 s, and stable. She is not having any respiratory distress. She's not reporting symptoms of pneumonia per se without body aches or fevers. She's not reporting chest pain and ACS seems less likely clinically.  Her EKG is nonspecific.  Chest x-ray shows no focal pneumonia.  Given her history of prior DVT, I discussed with her obtaining a CT to rule out pulmonary embolism. Next the next line ultimately it sounds to me like her tight breath sounds are probably related to bronchospasm in association with a diagnosis of bronchitis. She did try a DuoNeb and stated symptomatic improvement.  I discussed with her that if her CT scan is reassuring, I suspect her symptoms are from bronchitis/bronchospasm and at this point would treat her with albuterol inhaler as well as prednisone taper.  CT scan of the chest is pending. Patient care will be transferred to Dr. Cinda Quest at shift change 3 PM. If negative/reassuring chest CT, patient may be discharged with I prepared discharge instructions.   CONSULTATIONS: None Patient / Family / Caregiver informed of clinical course, medical decision-making process, and agree with plan.   I discussed return precautions, follow-up instructions, and discharge instructions with patient and/or  family.   ___________________________________________   FINAL CLINICAL IMPRESSION(S) / ED DIAGNOSES   Final diagnoses:  Bronchitis with bronchospasm              Note: This dictation was prepared with Dragon dictation. Any transcriptional errors that result from this process are unintentional    Lisa Roca, MD 12/17/16 [phone]

## 2016-12-17 NOTE — ED Notes (Signed)
Family at bedside. 

## 2016-12-17 NOTE — ED Provider Notes (Signed)
Study Result   CLINICAL DATA:  82 year old with shortness of breath for 2 weeks. History of DVT. Evaluate for pulmonary embolism.  EXAM: CT ANGIOGRAPHY CHEST WITH CONTRAST  TECHNIQUE: Multidetector CT imaging of the chest was performed using the standard protocol during bolus administration of intravenous contrast. Multiplanar CT image reconstructions and MIPs were obtained to evaluate the vascular anatomy.  CONTRAST:  75 ml Isovue 370.  COMPARISON:  Abdominal CT 09/12/2016.  FINDINGS: Cardiovascular: The pulmonary arteries are well opacified with contrast to the level of the subsegmental branches. There is no evidence of acute pulmonary embolism. There is atherosclerosis of the aorta, great muscles and coronary arteries. The aortic lumen is not yet opacified. No aneurysm identified. The heart size is normal. There is no pericardial effusion.  Mediastinum/Nodes: There are no enlarged mediastinal, hilar or axillary lymph nodes. Moderate-sized hiatal hernia. The trachea and esophagus demonstrate no significant findings.  Lungs/Pleura: There are moderate size dependent bilateral pleural effusions with resulting dependent atelectasis in both lower lobes. There is right-greater-than-left biapical scarring. No confluent airspace opacity or suspicious pulmonary nodule.  Upper abdomen: There is some reflux of contrast into the IVC and hepatic veins. The visualized upper abdomen otherwise appears unremarkable.  Musculoskeletal/Chest wall: Mildly expansile lucent lesion is noted laterally in the right fifth rib, best seen on sagittal image 13. This appears nonaggressive. No aggressive lesion or acute fracture identified.  Review of the MIP images confirms the above findings.  IMPRESSION: 1. No evidence of acute pulmonary embolism. 2. Moderate size bilateral pleural effusions with associated bibasilar atelectasis. These findings in conjunction with reflux of contrast  into the IVC and hepatic veins suggest possible heart failure. No definite edema. 3. Atherosclerosis, including the coronary arteries. 4. Moderate size hiatal hernia. 5. Nonspecific lucent lesion in the right fifth rib laterally. This may be incidental, although apparently the patient has a history of plasmacytoma; as such, multiple myeloma not completely excluded.   Electronically Signed   By: Richardean Sale M.D.   On: 12/17/2016 15:40   Second troponin is stable at 0.04. Flu was negative I will discharge the patient's plan.   Nena Polio, MD 12/17/16 [phone]

## 2016-12-17 NOTE — Discharge Instructions (Signed)
You were evaluated for shortness of breath, and your exam and evaluation are reassuring in the emergency room today. As we discussed, I suspect bronchospasm/bronchitis as the cause of your symptoms. For this I am prescribing prednisone taper as well as albuterol inhaler which she can take 2 puffs every 4 hours as needed for shortness of breath or wheezing.  Please follow up with primary care doctor, and also provided office number for cardiologist if you do not have one for close follow-up as well.  Return to the emergency room for any new or worsening symptoms of shortness breath, confusion altered mental status, palpitations, chest pain, fever, or any other symptoms concerning to you.

## 2016-12-17 NOTE — ED Notes (Signed)
Dr Reita Cliche notified in person that pt with Troponin of 0.04. And was acknowledged.

## 2017-01-02 ENCOUNTER — Encounter (INDEPENDENT_AMBULATORY_CARE_PROVIDER_SITE_OTHER): Payer: Medicare Other | Admitting: Ophthalmology

## 2017-01-02 DIAGNOSIS — H43813 Vitreous degeneration, bilateral: Secondary | ICD-10-CM

## 2017-01-02 DIAGNOSIS — I10 Essential (primary) hypertension: Secondary | ICD-10-CM | POA: Diagnosis not present

## 2017-01-02 DIAGNOSIS — H35033 Hypertensive retinopathy, bilateral: Secondary | ICD-10-CM | POA: Diagnosis not present

## 2017-01-02 DIAGNOSIS — H353231 Exudative age-related macular degeneration, bilateral, with active choroidal neovascularization: Secondary | ICD-10-CM

## 2017-01-30 ENCOUNTER — Encounter (INDEPENDENT_AMBULATORY_CARE_PROVIDER_SITE_OTHER): Payer: Medicare Other | Admitting: Ophthalmology

## 2017-01-30 DIAGNOSIS — H35033 Hypertensive retinopathy, bilateral: Secondary | ICD-10-CM | POA: Diagnosis not present

## 2017-01-30 DIAGNOSIS — H353231 Exudative age-related macular degeneration, bilateral, with active choroidal neovascularization: Secondary | ICD-10-CM

## 2017-01-30 DIAGNOSIS — H43813 Vitreous degeneration, bilateral: Secondary | ICD-10-CM

## 2017-01-30 DIAGNOSIS — I10 Essential (primary) hypertension: Secondary | ICD-10-CM | POA: Diagnosis not present

## 2017-02-12 DIAGNOSIS — G629 Polyneuropathy, unspecified: Secondary | ICD-10-CM | POA: Diagnosis not present

## 2017-02-12 DIAGNOSIS — M4712 Other spondylosis with myelopathy, cervical region: Secondary | ICD-10-CM | POA: Diagnosis not present

## 2017-02-12 DIAGNOSIS — G5622 Lesion of ulnar nerve, left upper limb: Secondary | ICD-10-CM | POA: Diagnosis not present

## 2017-02-26 DIAGNOSIS — G608 Other hereditary and idiopathic neuropathies: Secondary | ICD-10-CM | POA: Diagnosis not present

## 2017-02-27 ENCOUNTER — Encounter (INDEPENDENT_AMBULATORY_CARE_PROVIDER_SITE_OTHER): Payer: Medicare Other | Admitting: Ophthalmology

## 2017-02-27 DIAGNOSIS — H43813 Vitreous degeneration, bilateral: Secondary | ICD-10-CM | POA: Diagnosis not present

## 2017-02-27 DIAGNOSIS — H353231 Exudative age-related macular degeneration, bilateral, with active choroidal neovascularization: Secondary | ICD-10-CM | POA: Diagnosis not present

## 2017-02-27 DIAGNOSIS — I10 Essential (primary) hypertension: Secondary | ICD-10-CM

## 2017-02-27 DIAGNOSIS — H35033 Hypertensive retinopathy, bilateral: Secondary | ICD-10-CM

## 2017-02-28 DIAGNOSIS — G5602 Carpal tunnel syndrome, left upper limb: Secondary | ICD-10-CM | POA: Diagnosis not present

## 2017-03-28 DIAGNOSIS — M810 Age-related osteoporosis without current pathological fracture: Secondary | ICD-10-CM | POA: Diagnosis not present

## 2017-03-28 DIAGNOSIS — I340 Nonrheumatic mitral (valve) insufficiency: Secondary | ICD-10-CM | POA: Diagnosis not present

## 2017-03-28 DIAGNOSIS — Z79899 Other long term (current) drug therapy: Secondary | ICD-10-CM | POA: Diagnosis not present

## 2017-03-28 DIAGNOSIS — I10 Essential (primary) hypertension: Secondary | ICD-10-CM | POA: Diagnosis not present

## 2017-03-28 DIAGNOSIS — K219 Gastro-esophageal reflux disease without esophagitis: Secondary | ICD-10-CM | POA: Diagnosis not present

## 2017-03-28 DIAGNOSIS — F320 Major depressive disorder, single episode, mild: Secondary | ICD-10-CM | POA: Diagnosis not present

## 2017-03-28 DIAGNOSIS — E782 Mixed hyperlipidemia: Secondary | ICD-10-CM | POA: Diagnosis not present

## 2017-04-04 ENCOUNTER — Encounter (INDEPENDENT_AMBULATORY_CARE_PROVIDER_SITE_OTHER): Payer: Medicare Other | Admitting: Ophthalmology

## 2017-04-04 DIAGNOSIS — H43813 Vitreous degeneration, bilateral: Secondary | ICD-10-CM | POA: Diagnosis not present

## 2017-04-04 DIAGNOSIS — I10 Essential (primary) hypertension: Secondary | ICD-10-CM

## 2017-04-04 DIAGNOSIS — H353231 Exudative age-related macular degeneration, bilateral, with active choroidal neovascularization: Secondary | ICD-10-CM

## 2017-04-04 DIAGNOSIS — H35033 Hypertensive retinopathy, bilateral: Secondary | ICD-10-CM | POA: Diagnosis not present

## 2017-04-29 DIAGNOSIS — R748 Abnormal levels of other serum enzymes: Secondary | ICD-10-CM | POA: Diagnosis not present

## 2017-05-01 DIAGNOSIS — E782 Mixed hyperlipidemia: Secondary | ICD-10-CM | POA: Diagnosis not present

## 2017-05-01 DIAGNOSIS — I10 Essential (primary) hypertension: Secondary | ICD-10-CM | POA: Diagnosis not present

## 2017-05-01 DIAGNOSIS — I340 Nonrheumatic mitral (valve) insufficiency: Secondary | ICD-10-CM | POA: Diagnosis not present

## 2017-05-01 DIAGNOSIS — I2510 Atherosclerotic heart disease of native coronary artery without angina pectoris: Secondary | ICD-10-CM | POA: Diagnosis not present

## 2017-05-02 ENCOUNTER — Encounter (INDEPENDENT_AMBULATORY_CARE_PROVIDER_SITE_OTHER): Payer: Medicare Other | Admitting: Ophthalmology

## 2017-05-02 DIAGNOSIS — H35033 Hypertensive retinopathy, bilateral: Secondary | ICD-10-CM

## 2017-05-02 DIAGNOSIS — H353231 Exudative age-related macular degeneration, bilateral, with active choroidal neovascularization: Secondary | ICD-10-CM

## 2017-05-02 DIAGNOSIS — I10 Essential (primary) hypertension: Secondary | ICD-10-CM

## 2017-05-02 DIAGNOSIS — H43813 Vitreous degeneration, bilateral: Secondary | ICD-10-CM

## 2017-06-04 ENCOUNTER — Encounter (INDEPENDENT_AMBULATORY_CARE_PROVIDER_SITE_OTHER): Payer: Medicare Other | Admitting: Ophthalmology

## 2017-06-16 ENCOUNTER — Encounter (INDEPENDENT_AMBULATORY_CARE_PROVIDER_SITE_OTHER): Payer: Medicare Other | Admitting: Ophthalmology

## 2017-06-16 DIAGNOSIS — H353231 Exudative age-related macular degeneration, bilateral, with active choroidal neovascularization: Secondary | ICD-10-CM

## 2017-06-16 DIAGNOSIS — H35033 Hypertensive retinopathy, bilateral: Secondary | ICD-10-CM

## 2017-06-16 DIAGNOSIS — I10 Essential (primary) hypertension: Secondary | ICD-10-CM

## 2017-06-16 DIAGNOSIS — H43813 Vitreous degeneration, bilateral: Secondary | ICD-10-CM

## 2017-07-02 DIAGNOSIS — C9030 Solitary plasmacytoma not having achieved remission: Secondary | ICD-10-CM | POA: Diagnosis not present

## 2017-07-11 DIAGNOSIS — C9030 Solitary plasmacytoma not having achieved remission: Secondary | ICD-10-CM | POA: Diagnosis not present

## 2017-07-14 ENCOUNTER — Encounter (INDEPENDENT_AMBULATORY_CARE_PROVIDER_SITE_OTHER): Payer: Medicare Other | Admitting: Ophthalmology

## 2017-07-14 DIAGNOSIS — H35033 Hypertensive retinopathy, bilateral: Secondary | ICD-10-CM

## 2017-07-14 DIAGNOSIS — I10 Essential (primary) hypertension: Secondary | ICD-10-CM

## 2017-07-14 DIAGNOSIS — H43813 Vitreous degeneration, bilateral: Secondary | ICD-10-CM | POA: Diagnosis not present

## 2017-07-14 DIAGNOSIS — H353231 Exudative age-related macular degeneration, bilateral, with active choroidal neovascularization: Secondary | ICD-10-CM

## 2017-08-01 ENCOUNTER — Other Ambulatory Visit: Payer: Self-pay

## 2017-08-01 NOTE — Patient Outreach (Signed)
[place] [facility]) Care Management  08/01/2017  Nikiyah Fackler 18-Apr-1929 [id number]   TELEPHONE SCREENING Referral date: 07/31/17 Referral source: EMMI prevent Referral reason: Scored 6 Insurance: Medicare/ AARP  SUBJECTIVE: Telephone call to patient regarding EMMI prevent referral. HIPAA verified with patient.  Discussed patients engagement tool questionnaire. Patient states primary MD physician listed in her medical record is not her doctor.  Patient states her new primary MD is at [facility] in [place], [place].  Discussed and offered [facility] care management services with patient. Patient denies needing any services at this time. Patient agreed to receive [facility] care management outreach letter and brochure for future reference.  Discussed advance directive with patient. Patient agreed to receive advance directive packet to review. Patient advised to call [facility] care management if she needs any assistance with Advance directive.   SOCIAL/ SUPPORTIVE CARE: Patient and her husband live with her daughter and son in law.   RNCM called [facility] and spoke with Hernita who states patient sees De Burrs,  [place] as her primary provider.   PLAN: RNCM will refer patient to care management assistant to close due to refusal of services.  RNCM will notify patients primary MD of closure.  RNCM will send patient [facility] care management letter, brochure and advance directive packet.   Quinn Plowman RN,BSN,CCM [facility] Telephonic  [phone]

## 2017-08-07 DIAGNOSIS — H40013 Open angle with borderline findings, low risk, bilateral: Secondary | ICD-10-CM | POA: Diagnosis not present

## 2017-08-07 DIAGNOSIS — H353230 Exudative age-related macular degeneration, bilateral, stage unspecified: Secondary | ICD-10-CM | POA: Diagnosis not present

## 2017-08-13 ENCOUNTER — Encounter (INDEPENDENT_AMBULATORY_CARE_PROVIDER_SITE_OTHER): Payer: Medicare Other | Admitting: Ophthalmology

## 2017-08-18 ENCOUNTER — Encounter (INDEPENDENT_AMBULATORY_CARE_PROVIDER_SITE_OTHER): Payer: Medicare Other | Admitting: Ophthalmology

## 2017-08-26 ENCOUNTER — Encounter (INDEPENDENT_AMBULATORY_CARE_PROVIDER_SITE_OTHER): Payer: Medicare Other | Admitting: Ophthalmology

## 2017-08-26 DIAGNOSIS — I10 Essential (primary) hypertension: Secondary | ICD-10-CM | POA: Diagnosis not present

## 2017-08-26 DIAGNOSIS — H35033 Hypertensive retinopathy, bilateral: Secondary | ICD-10-CM

## 2017-08-26 DIAGNOSIS — H353231 Exudative age-related macular degeneration, bilateral, with active choroidal neovascularization: Secondary | ICD-10-CM

## 2017-08-26 DIAGNOSIS — H43813 Vitreous degeneration, bilateral: Secondary | ICD-10-CM

## 2017-09-25 DIAGNOSIS — Z79899 Other long term (current) drug therapy: Secondary | ICD-10-CM | POA: Diagnosis not present

## 2017-09-25 DIAGNOSIS — I10 Essential (primary) hypertension: Secondary | ICD-10-CM | POA: Diagnosis not present

## 2017-09-25 DIAGNOSIS — E782 Mixed hyperlipidemia: Secondary | ICD-10-CM | POA: Diagnosis not present

## 2017-09-29 ENCOUNTER — Encounter (INDEPENDENT_AMBULATORY_CARE_PROVIDER_SITE_OTHER): Payer: Medicare Other | Admitting: Ophthalmology

## 2017-09-29 DIAGNOSIS — H43813 Vitreous degeneration, bilateral: Secondary | ICD-10-CM

## 2017-09-29 DIAGNOSIS — H35033 Hypertensive retinopathy, bilateral: Secondary | ICD-10-CM

## 2017-09-29 DIAGNOSIS — H353231 Exudative age-related macular degeneration, bilateral, with active choroidal neovascularization: Secondary | ICD-10-CM

## 2017-09-29 DIAGNOSIS — I10 Essential (primary) hypertension: Secondary | ICD-10-CM | POA: Diagnosis not present

## 2017-09-30 DIAGNOSIS — Z79899 Other long term (current) drug therapy: Secondary | ICD-10-CM | POA: Diagnosis not present

## 2017-09-30 DIAGNOSIS — C9030 Solitary plasmacytoma not having achieved remission: Secondary | ICD-10-CM | POA: Diagnosis not present

## 2017-09-30 DIAGNOSIS — E782 Mixed hyperlipidemia: Secondary | ICD-10-CM | POA: Diagnosis not present

## 2017-09-30 DIAGNOSIS — Z23 Encounter for immunization: Secondary | ICD-10-CM | POA: Diagnosis not present

## 2017-09-30 DIAGNOSIS — K219 Gastro-esophageal reflux disease without esophagitis: Secondary | ICD-10-CM | POA: Diagnosis not present

## 2017-09-30 DIAGNOSIS — I340 Nonrheumatic mitral (valve) insufficiency: Secondary | ICD-10-CM | POA: Diagnosis not present

## 2017-09-30 DIAGNOSIS — I10 Essential (primary) hypertension: Secondary | ICD-10-CM | POA: Diagnosis not present

## 2017-09-30 DIAGNOSIS — I2510 Atherosclerotic heart disease of native coronary artery without angina pectoris: Secondary | ICD-10-CM | POA: Diagnosis not present

## 2017-09-30 DIAGNOSIS — M810 Age-related osteoporosis without current pathological fracture: Secondary | ICD-10-CM | POA: Diagnosis not present

## 2017-09-30 DIAGNOSIS — R739 Hyperglycemia, unspecified: Secondary | ICD-10-CM | POA: Diagnosis not present

## 2017-09-30 DIAGNOSIS — F320 Major depressive disorder, single episode, mild: Secondary | ICD-10-CM | POA: Diagnosis not present

## 2017-11-03 ENCOUNTER — Encounter (INDEPENDENT_AMBULATORY_CARE_PROVIDER_SITE_OTHER): Payer: Medicare Other | Admitting: Ophthalmology

## 2017-11-03 DIAGNOSIS — H353231 Exudative age-related macular degeneration, bilateral, with active choroidal neovascularization: Secondary | ICD-10-CM

## 2017-11-03 DIAGNOSIS — H43813 Vitreous degeneration, bilateral: Secondary | ICD-10-CM | POA: Diagnosis not present

## 2017-11-03 DIAGNOSIS — I10 Essential (primary) hypertension: Secondary | ICD-10-CM

## 2017-11-03 DIAGNOSIS — H35033 Hypertensive retinopathy, bilateral: Secondary | ICD-10-CM

## 2017-11-19 DIAGNOSIS — I340 Nonrheumatic mitral (valve) insufficiency: Secondary | ICD-10-CM | POA: Diagnosis not present

## 2017-11-19 DIAGNOSIS — E782 Mixed hyperlipidemia: Secondary | ICD-10-CM | POA: Diagnosis not present

## 2017-11-19 DIAGNOSIS — I10 Essential (primary) hypertension: Secondary | ICD-10-CM | POA: Diagnosis not present

## 2017-11-19 DIAGNOSIS — I2510 Atherosclerotic heart disease of native coronary artery without angina pectoris: Secondary | ICD-10-CM | POA: Diagnosis not present

## 2017-12-01 ENCOUNTER — Encounter (INDEPENDENT_AMBULATORY_CARE_PROVIDER_SITE_OTHER): Payer: Medicare Other | Admitting: Ophthalmology

## 2017-12-05 ENCOUNTER — Encounter (INDEPENDENT_AMBULATORY_CARE_PROVIDER_SITE_OTHER): Payer: Medicare Other | Admitting: Ophthalmology

## 2017-12-05 DIAGNOSIS — I10 Essential (primary) hypertension: Secondary | ICD-10-CM | POA: Diagnosis not present

## 2017-12-05 DIAGNOSIS — H43813 Vitreous degeneration, bilateral: Secondary | ICD-10-CM

## 2017-12-05 DIAGNOSIS — H35033 Hypertensive retinopathy, bilateral: Secondary | ICD-10-CM | POA: Diagnosis not present

## 2017-12-05 DIAGNOSIS — H353231 Exudative age-related macular degeneration, bilateral, with active choroidal neovascularization: Secondary | ICD-10-CM | POA: Diagnosis not present

## 2018-01-02 ENCOUNTER — Encounter (INDEPENDENT_AMBULATORY_CARE_PROVIDER_SITE_OTHER): Payer: Medicare Other | Admitting: Ophthalmology

## 2018-01-02 DIAGNOSIS — H35033 Hypertensive retinopathy, bilateral: Secondary | ICD-10-CM | POA: Diagnosis not present

## 2018-01-02 DIAGNOSIS — H353231 Exudative age-related macular degeneration, bilateral, with active choroidal neovascularization: Secondary | ICD-10-CM | POA: Diagnosis not present

## 2018-01-02 DIAGNOSIS — H43813 Vitreous degeneration, bilateral: Secondary | ICD-10-CM | POA: Diagnosis not present

## 2018-01-02 DIAGNOSIS — I10 Essential (primary) hypertension: Secondary | ICD-10-CM | POA: Diagnosis not present

## 2018-01-30 ENCOUNTER — Encounter (INDEPENDENT_AMBULATORY_CARE_PROVIDER_SITE_OTHER): Payer: Medicare Other | Admitting: Ophthalmology

## 2018-01-30 DIAGNOSIS — H43813 Vitreous degeneration, bilateral: Secondary | ICD-10-CM

## 2018-01-30 DIAGNOSIS — I10 Essential (primary) hypertension: Secondary | ICD-10-CM

## 2018-01-30 DIAGNOSIS — H35033 Hypertensive retinopathy, bilateral: Secondary | ICD-10-CM | POA: Diagnosis not present

## 2018-01-30 DIAGNOSIS — H353231 Exudative age-related macular degeneration, bilateral, with active choroidal neovascularization: Secondary | ICD-10-CM

## 2018-02-01 ENCOUNTER — Emergency Department
Admission: EM | Admit: 2018-02-01 | Discharge: 2018-02-01 | Disposition: A | Discharge status: Home / Self Care | Payer: Medicare Other | Attending: Emergency Medicine | Admitting: Emergency Medicine

## 2018-02-01 ENCOUNTER — Emergency Department: Payer: Medicare Other

## 2018-02-01 DIAGNOSIS — S3993XA Unspecified injury of pelvis, initial encounter: Secondary | ICD-10-CM | POA: Diagnosis not present

## 2018-02-01 DIAGNOSIS — Y929 Unspecified place or not applicable: Secondary | ICD-10-CM | POA: Diagnosis not present

## 2018-02-01 DIAGNOSIS — I10 Essential (primary) hypertension: Secondary | ICD-10-CM | POA: Insufficient documentation

## 2018-02-01 DIAGNOSIS — Y999 Unspecified external cause status: Secondary | ICD-10-CM | POA: Diagnosis not present

## 2018-02-01 DIAGNOSIS — Z79899 Other long term (current) drug therapy: Secondary | ICD-10-CM | POA: Diagnosis not present

## 2018-02-01 DIAGNOSIS — M25551 Pain in right hip: Secondary | ICD-10-CM | POA: Insufficient documentation

## 2018-02-01 DIAGNOSIS — Y92009 Unspecified place in unspecified non-institutional (private) residence as the place of occurrence of the external cause: Secondary | ICD-10-CM

## 2018-02-01 DIAGNOSIS — M545 Low back pain, unspecified: Secondary | ICD-10-CM

## 2018-02-01 DIAGNOSIS — Y9301 Activity, walking, marching and hiking: Secondary | ICD-10-CM | POA: Insufficient documentation

## 2018-02-01 DIAGNOSIS — W19XXXA Unspecified fall, initial encounter: Secondary | ICD-10-CM | POA: Diagnosis not present

## 2018-02-01 DIAGNOSIS — M25552 Pain in left hip: Secondary | ICD-10-CM | POA: Insufficient documentation

## 2018-02-01 DIAGNOSIS — Z7982 Long term (current) use of aspirin: Secondary | ICD-10-CM | POA: Insufficient documentation

## 2018-02-01 DIAGNOSIS — R102 Pelvic and perineal pain: Secondary | ICD-10-CM | POA: Diagnosis not present

## 2018-02-01 MED ORDER — TRAMADOL HCL 50 MG PO TABS
50.0000 mg | ORAL_TABLET | Freq: Once | ORAL | Status: AC
  Administered 2018-02-01: 50 mg via ORAL
  Filled 2018-02-01: qty 1

## 2018-02-01 MED ORDER — TRAMADOL HCL 50 MG PO TABS: 50.0000 mg | ORAL_TABLET | Freq: Two times a day (BID) | ORAL | 0 refills | Status: DC | PRN

## 2018-02-01 NOTE — ED Provider Notes (Signed)
[facility] Emergency Department Provider Note   ____________________________________________   None    (approximate)  I have reviewed the triage vital signs and the nursing notes.   HISTORY  Chief Complaint Fall    HPI Amy [name] is a 84 y.o. female patient complain of low back and bilateral hip pain secondary to a fall which occurred 2 days ago.  Patient denies radicular component to her back pain.  Patient denies bladder bowel dysfunction.  Patient able to ambulate with pain.  Patient denies LOC.  Patient reports hitting her head when she fell but is denying headache.  Patient take aspirin daily.  Patient state no relief with Tylenol ibuprofen.  Patient rates the pain as a 9/10.  Patient described the pain is "sharp".  Past Medical History:  Diagnosis Date  . Arthritis   . Depression   . Diverticulitis   . DVT (deep venous thrombosis) (HCC)    Right leg   . Hyperlipidemia   . Hypertension   . Mitral valve regurgitation   . 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 Renal disorder     Patient Active Problem List   Diagnosis Date Noted  . Orthostatic hypotension 09/13/2016  . Dehydration 09/13/2016  . Trismus 09/12/2016  . Syncope 09/11/2016  . Poor appetite 09/11/2016  . Atherosclerosis of native coronary artery of native heart without angina pectoris 05/13/2016  . Hiatal hernia without gangrene and obstruction 12/01/2015  . Herpes zona 11/25/2015  . Hx of deep vein thrombophlebitis of lower extremity 11/25/2015  . Senile ecchymosis 11/14/2015  . Mixed hyperlipidemia 11/14/2015  . Acute kidney failure ([place]) 11/09/2015  . Arthritis, degenerative 11/09/2015  . Cervical spinal cord compression ([place]) 11/09/2015  . Osteoporosis, post-menopausal 11/09/2015  . Female genuine stress incontinence 11/09/2015  . Polyneuropathy 09/30/2014  . Benign essential HTN 06/08/2014  . MI (mitral incompetence) 06/08/2014  . 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 ABDOMINAL SURGERY    . bladder tack  1990  . BREAST BIOPSY    . CATARACT EXTRACTION, BILATERAL    . HERNIA REPAIR    . plasmacytoma resection  2005   lumbar    Prior to Admission medications   Medication Sig Start Date End Date Taking? Authorizing Provider  acetaminophen (TYLENOL) 500 MG tablet Take 500 mg by mouth every 4 (four) hours as needed.    [provider]  albuterol (PROVENTIL HFA;VENTOLIN HFA) 108 (90 Base) MCG/ACT inhaler Inhale 2 puffs into the lungs every 6 (six) hours as needed for wheezing or shortness of breath. 12/17/16   Lisa Roca, MD  aspirin EC 81 MG tablet Take 1 tablet by mouth daily.    [provider]  azithromycin (ZITHROMAX) 250 MG tablet Take 1 tablet (250 mg total) by mouth daily. Take first 2 tablets together, then 1 every day until finished. 11/15/16   Juline Patch, MD  cholecalciferol (VITAMIN D) 400 units TABS tablet Take 400 Units by mouth daily.    [provider]  cyclobenzaprine (FLEXERIL) 5 MG tablet Take 5-10 mg by mouth 3 (three) times daily as needed for muscle spasms.    [provider]  DULoxetine (CYMBALTA) 60 MG capsule Take 60 mg by mouth daily.    [provider]  ENSURE PLUS (ENSURE PLUS) LIQD Take 237 mLs by mouth 3 (three) times daily between meals. 09/13/16   Theodoro Grist, MD  guaiFENesin-codeine (ROBITUSSIN AC) 100-10 MG/5ML syrup Take 5  mLs by mouth 3 (three) times daily as needed for cough. 11/15/16   Juline Patch, MD  Melatonin 5 MG TABS Take 1 tablet by mouth at bedtime.    [provider]  Omega-3 Fatty Acids (FISH OIL) 1000 MG CAPS Take 2 capsules by mouth 2 (two) times daily.     [provider]  pantoprazole (PROTONIX) 40 MG tablet Take 40 mg by mouth daily.    [provider]  Polyvinyl Alcohol-Povidone (REFRESH OP) Apply 1-2 drops to eye as needed.    [provider]  predniSONE (DELTASONE)  10 MG tablet 50 mg by mouth for 2 days 40 mg by mouth for 2 days 30 mg by mouth 4 2 days 20 mg by mouth for 2 days 10 mg by mouth for 2 days 12/17/16   Lisa Roca, MD  ranitidine (ZANTAC) 150 MG tablet Take 1 tablet by mouth 2 (two) times daily. 12/27/14   [provider]  sucralfate (CARAFATE) 1 g tablet Take 1 g by mouth 4 (four) times daily -  with meals and at bedtime.    [provider]  traMADol (ULTRAM) 50 MG tablet Take 1 tablet (50 mg total) by mouth every 12 (twelve) hours as needed. 02/01/18   Sable Feil, PA-C  VYTORIN 10-20 MG tablet Take 1 tablet by mouth daily at 6 PM.  10/23/15   [provider]    Allergies Doxycycline hyclate; Levofloxacin; and Sulfa antibiotics  Family History  Problem Relation Age of Onset  . Heart failure Mother   . Heart failure Father     Social History Social History   Tobacco Use  . Smoking status: Never Smoker  . Smokeless tobacco: Never Used  Substance Use Topics  . Alcohol use: No    Alcohol/week: 0.0 oz  . Drug use: No    Review of Systems Constitutional: No fever/chills Eyes: No visual changes. ENT: No sore throat. Cardiovascular: Denies chest pain. Respiratory: Denies shortness of breath. Gastrointestinal: No abdominal pain.  No nausea, no vomiting.  No diarrhea.  No constipation. Genitourinary: Negative for dysuria. Musculoskeletal: Negative for back pain. Skin: Negative for rash. Neurological: Negative for headaches, focal weakness or numbness. Psychiatric:Depression Endocrine:Hyperlipidemia and hypertension Allergic/Immunilogical: See medication list ____________________________________________   PHYSICAL EXAM:  VITAL SIGNS: ED Triage Vitals  Enc Vitals Group     BP 02/01/18 1425 134/73     Pulse Rate 02/01/18 1425 93     Resp 02/01/18 1425 18     Temp 02/01/18 1425 98.1 F (36.7 C)     Temp Source 02/01/18 1425 Oral     SpO2 02/01/18 1425 97 %     Weight 02/01/18 1448 130  lb (59 kg)     Height 02/01/18 1448 5\' 5"  (1.651 m)     Head Circumference --      Peak Flow --      Pain Score 02/01/18 1447 9     Pain Loc --      Pain Edu? --      Excl. in [place]? --    Constitutional: Alert and oriented. Well appearing and in no acute distress. Eyes: Conjunctivae are normal. PERRL. EOMI. Head: Atraumatic. Neck: No stridor.  No cervical spine tenderness to palpation. Hematological/Lymphatic/Immunilogical: No cervical lymphadenopathy. Cardiovascular: Normal rate, regular rhythm. Grossly normal heart sounds.  Good peripheral circulation. Respiratory: Normal respiratory effort.  No retractions.  Musculoskeletal: No lower extremity tenderness nor edema.  No joint effusions. Neurologic:  Normal speech and  language. No gross focal neurologic deficits are appreciated. No gait instability. Skin:  Skin is warm, dry and intact. No rash noted. Psychiatric: Mood and affect are normal. Speech and behavior are normal.  ____________________________________________   LABS (all labs ordered are listed, but only abnormal results are displayed)  Labs Reviewed - No data to display ____________________________________________  EKG   ____________________________________________  RADIOLOGY  ED MD interpretation: X-rays of the pelvic/hip and lumbar spine reveals no acute findings.  Mild to moderate DJD changes apparent.  Official radiology report(s): Dg Lumbar Spine Complete  Result Date: 02/01/2018 CLINICAL DATA:  Lumbago following recent fall EXAM: LUMBAR SPINE - COMPLETE 4+ VIEW COMPARISON:  CT chest with bony reformats December 17, 2016 FINDINGS: Frontal, lateral, spot lumbosacral lateral, and bilateral oblique oblique views were obtained. The there are 5 non-rib-bearing lumbar type vertebral bodies. There is age uncertain anterior wedging of the T11 vertebral body, not present on prior study from 2017. No lumbar region fracture evident. No spondylolisthesis. There is mild disc  space narrowing at L4-5 and L5-S1. Other disc spaces appear unremarkable. There is facet osteoarthritic change at L3-4, L4-5, and L5-S1 bilaterally. Bones are osteoporotic.  There is aortoiliac atherosclerosis. IMPRESSION: 1. Anterior wedging of the T12 vertebral body, age uncertain but not present on prior study from December 2017. No other fracture evident. No spondylolisthesis. 2.   Osteoarthritic change at several levels. 3.  Bones osteoporotic. 4.  Aortoiliac atherosclerosis. Aortic Atherosclerosis (ICD10-I70.0). Electronically Signed   By: Lowella Grip III M.D.   On: 02/01/2018 15:40   Dg Pelvis 1-2 Views  Result Date: 02/01/2018 CLINICAL DATA:  Pain following fall EXAM: PELVIS - 1-2 VIEW COMPARISON:  None. FINDINGS: There is no evidence of pelvic fracture or dislocation. There is osteoarthritic change in the pubic symphysis as well as moderate symmetric narrowing of both hip joints. Bones are osteoporotic. There is common femoral, superficial femoral, and profunda femoral artery calcification bilaterally. IMPRESSION: Symmetric narrowing both hip joints. Osteoarthritic change in PICC symphysis. No fracture or dislocation. Bones osteoporotic. There are foci of atherosclerotic calcification bilaterally. Electronically Signed   By: Lowella Grip III M.D.   On: 02/01/2018 15:41    ____________________________________________   PROCEDURES  Procedure(s) performed: None  Procedures  Critical Care performed: No  ____________________________________________   INITIAL IMPRESSION / ASSESSMENT AND PLAN / ED COURSE  As part of my medical decision making, I reviewed the following data within the electronic MEDICAL RECORD NUMBER     Back and hip pain secondary to fall.  Discussed no acute findings on x-ray of the hips and lumbar spine.  Patient given discharge care instructions.  Patient given prescription for tramadol 50 mg twice daily for 5 days to take as needed for pain.  Patient advised to  follow-up PCP in 3 days if no improvement.  Return ER if condition worsens.      ____________________________________________   FINAL CLINICAL IMPRESSION(S) / ED DIAGNOSES  Final diagnoses:  Back pain at L4-L5 level  Bilateral hip pain  Fall in home, initial encounter     ED Discharge Orders        Ordered    traMADol (ULTRAM) 50 MG tablet  Every 12 hours PRN     02/01/18 1558       Note:  This document was prepared using Dragon voice recognition software and may include unintentional dictation errors.    Sable Feil, PA-C 02/01/18 1603    Nance Pear, MD 02/01/18 [phone]

## 2018-02-01 NOTE — ED Triage Notes (Signed)
Pt presents POV c/o lower back pain and bilateral hip pain s/p fall on Friday at 1800. Reports bruising. Reports neck surgery in 2005. Pt reports ambulating since Friday with pain. Denies LOC. Reports hitting back of head when falling. Pt takes ASA daily. A&Ox4 at this time.

## 2018-02-01 NOTE — Discharge Instructions (Signed)
No acute findings on x-ray of the hip and the lumbar spine.  Advised to take tramadol every 12 hours as needed for pain for 2-3 days.  Follow-up with family doctor if no improvement in 2 days.

## 2018-02-16 DIAGNOSIS — S22080A Wedge compression fracture of T11-T12 vertebra, initial encounter for closed fracture: Secondary | ICD-10-CM | POA: Diagnosis not present

## 2018-02-16 DIAGNOSIS — M545 Low back pain: Secondary | ICD-10-CM | POA: Diagnosis not present

## 2018-02-16 DIAGNOSIS — M810 Age-related osteoporosis without current pathological fracture: Secondary | ICD-10-CM | POA: Diagnosis not present

## 2018-02-16 DIAGNOSIS — W19XXXA Unspecified fall, initial encounter: Secondary | ICD-10-CM | POA: Diagnosis not present

## 2018-02-18 ENCOUNTER — Other Ambulatory Visit: Payer: Self-pay | Admitting: Sports Medicine

## 2018-02-18 DIAGNOSIS — M545 Low back pain: Secondary | ICD-10-CM

## 2018-02-18 DIAGNOSIS — S22080A Wedge compression fracture of T11-T12 vertebra, initial encounter for closed fracture: Secondary | ICD-10-CM

## 2018-02-18 DIAGNOSIS — M810 Age-related osteoporosis without current pathological fracture: Secondary | ICD-10-CM

## 2018-02-26 ENCOUNTER — Ambulatory Visit
Admission: RE | Admit: 2018-02-26 | Discharge: 2018-02-26 | Disposition: A | Discharge status: Home / Self Care | Payer: Medicare Other | Source: Ambulatory Visit | Attending: Sports Medicine | Admitting: Sports Medicine

## 2018-02-26 DIAGNOSIS — M4854XA Collapsed vertebra, not elsewhere classified, thoracic region, initial encounter for fracture: Secondary | ICD-10-CM | POA: Diagnosis not present

## 2018-02-26 DIAGNOSIS — M48061 Spinal stenosis, lumbar region without neurogenic claudication: Secondary | ICD-10-CM | POA: Diagnosis not present

## 2018-02-26 DIAGNOSIS — M545 Low back pain: Secondary | ICD-10-CM

## 2018-02-26 DIAGNOSIS — M47817 Spondylosis without myelopathy or radiculopathy, lumbosacral region: Secondary | ICD-10-CM | POA: Insufficient documentation

## 2018-02-26 DIAGNOSIS — M810 Age-related osteoporosis without current pathological fracture: Secondary | ICD-10-CM

## 2018-02-26 DIAGNOSIS — M47816 Spondylosis without myelopathy or radiculopathy, lumbar region: Secondary | ICD-10-CM | POA: Insufficient documentation

## 2018-02-26 DIAGNOSIS — M4804 Spinal stenosis, thoracic region: Secondary | ICD-10-CM | POA: Diagnosis not present

## 2018-02-26 DIAGNOSIS — M160 Bilateral primary osteoarthritis of hip: Secondary | ICD-10-CM | POA: Diagnosis not present

## 2018-02-26 DIAGNOSIS — S22080A Wedge compression fracture of T11-T12 vertebra, initial encounter for closed fracture: Secondary | ICD-10-CM

## 2018-02-27 ENCOUNTER — Encounter (INDEPENDENT_AMBULATORY_CARE_PROVIDER_SITE_OTHER): Payer: Medicare Other | Admitting: Ophthalmology

## 2018-02-27 DIAGNOSIS — H353231 Exudative age-related macular degeneration, bilateral, with active choroidal neovascularization: Secondary | ICD-10-CM | POA: Diagnosis not present

## 2018-02-27 DIAGNOSIS — H35033 Hypertensive retinopathy, bilateral: Secondary | ICD-10-CM | POA: Diagnosis not present

## 2018-02-27 DIAGNOSIS — I10 Essential (primary) hypertension: Secondary | ICD-10-CM

## 2018-02-27 DIAGNOSIS — H43813 Vitreous degeneration, bilateral: Secondary | ICD-10-CM

## 2018-03-09 DIAGNOSIS — S22080A Wedge compression fracture of T11-T12 vertebra, initial encounter for closed fracture: Secondary | ICD-10-CM | POA: Diagnosis not present

## 2018-03-11 ENCOUNTER — Other Ambulatory Visit: Payer: Self-pay

## 2018-03-11 ENCOUNTER — Encounter
Admission: RE | Admit: 2018-03-11 | Discharge: 2018-03-11 | Disposition: A | Discharge status: Home / Self Care | Payer: Medicare Other | Source: Ambulatory Visit | Attending: Orthopedic Surgery | Admitting: Orthopedic Surgery

## 2018-03-11 DIAGNOSIS — N189 Chronic kidney disease, unspecified: Secondary | ICD-10-CM | POA: Diagnosis not present

## 2018-03-11 DIAGNOSIS — Z882 Allergy status to sulfonamides status: Secondary | ICD-10-CM | POA: Diagnosis not present

## 2018-03-11 DIAGNOSIS — Z7982 Long term (current) use of aspirin: Secondary | ICD-10-CM | POA: Diagnosis not present

## 2018-03-11 DIAGNOSIS — S22080A Wedge compression fracture of T11-T12 vertebra, initial encounter for closed fracture: Secondary | ICD-10-CM | POA: Diagnosis not present

## 2018-03-11 DIAGNOSIS — W19XXXA Unspecified fall, initial encounter: Secondary | ICD-10-CM | POA: Diagnosis not present

## 2018-03-11 DIAGNOSIS — Z79899 Other long term (current) drug therapy: Secondary | ICD-10-CM | POA: Diagnosis not present

## 2018-03-11 DIAGNOSIS — Z9049 Acquired absence of other specified parts of digestive tract: Secondary | ICD-10-CM | POA: Diagnosis not present

## 2018-03-11 DIAGNOSIS — E785 Hyperlipidemia, unspecified: Secondary | ICD-10-CM | POA: Diagnosis not present

## 2018-03-11 DIAGNOSIS — M1990 Unspecified osteoarthritis, unspecified site: Secondary | ICD-10-CM | POA: Diagnosis not present

## 2018-03-11 DIAGNOSIS — I10 Essential (primary) hypertension: Secondary | ICD-10-CM | POA: Diagnosis not present

## 2018-03-11 DIAGNOSIS — Y939 Activity, unspecified: Secondary | ICD-10-CM | POA: Diagnosis not present

## 2018-03-11 DIAGNOSIS — Z8249 Family history of ischemic heart disease and other diseases of the circulatory system: Secondary | ICD-10-CM | POA: Diagnosis not present

## 2018-03-11 DIAGNOSIS — K219 Gastro-esophageal reflux disease without esophagitis: Secondary | ICD-10-CM | POA: Diagnosis not present

## 2018-03-11 DIAGNOSIS — I129 Hypertensive chronic kidney disease with stage 1 through stage 4 chronic kidney disease, or unspecified chronic kidney disease: Secondary | ICD-10-CM | POA: Diagnosis not present

## 2018-03-11 DIAGNOSIS — I340 Nonrheumatic mitral (valve) insufficiency: Secondary | ICD-10-CM | POA: Diagnosis not present

## 2018-03-11 DIAGNOSIS — Z881 Allergy status to other antibiotic agents status: Secondary | ICD-10-CM | POA: Diagnosis not present

## 2018-03-11 DIAGNOSIS — Z823 Family history of stroke: Secondary | ICD-10-CM | POA: Diagnosis not present

## 2018-03-11 DIAGNOSIS — Z86718 Personal history of other venous thrombosis and embolism: Secondary | ICD-10-CM | POA: Diagnosis not present

## 2018-03-11 HISTORY — DX: Personal history of other diseases of the digestive system: Z87.19

## 2018-03-11 HISTORY — DX: Gastro-esophageal reflux disease without esophagitis: K21.9

## 2018-03-11 HISTORY — DX: Pneumonia, unspecified organism: J18.9

## 2018-03-11 HISTORY — DX: Family history of other specified conditions: Z84.89

## 2018-03-11 HISTORY — DX: Myoneural disorder, unspecified: G70.9

## 2018-03-11 LAB — CBC
HEMATOCRIT: 38.6 % (ref 35.0–47.0)
HEMOGLOBIN: 12.9 g/dL (ref 12.0–16.0)
MCH: 28.7 pg (ref 26.0–34.0)
MCHC: 33.5 g/dL (ref 32.0–36.0)
MCV: 85.6 fL (ref 80.0–100.0)
Platelets: 199 10*3/uL (ref 150–440)
RBC: 4.51 MIL/uL (ref 3.80–5.20)
RDW: 14.2 % (ref 11.5–14.5)
WBC: 7.6 10*3/uL (ref 3.6–11.0)

## 2018-03-11 LAB — BASIC METABOLIC PANEL
Anion gap: 11 (ref 5–15)
BUN: 28 mg/dL — ABNORMAL HIGH (ref 6–20)
CHLORIDE: 102 mmol/L (ref 101–111)
CO2: 27 mmol/L (ref 22–32)
Calcium: 9.4 mg/dL (ref 8.9–10.3)
Creatinine, Ser: 0.74 mg/dL (ref 0.44–1.00)
GFR calc non Af Amer: >60 mL/min (ref >60)
Glucose, Bld: 97 mg/dL (ref 65–99)
POTASSIUM: 3.8 mmol/L (ref 3.5–5.1)
SODIUM: 140 mmol/L (ref 135–145)

## 2018-03-11 LAB — SURGICAL PCR SCREEN
MRSA, PCR: NEGATIVE
STAPHYLOCOCCUS AUREUS: NEGATIVE

## 2018-03-11 MED ORDER — CEFAZOLIN SODIUM-DEXTROSE 2-4 GM/100ML-% IV SOLN
2.0000 g | Freq: Once | INTRAVENOUS | Status: AC
  Administered 2018-03-12: 2 g via INTRAVENOUS

## 2018-03-11 NOTE — Patient Instructions (Addendum)
  Your procedure is scheduled on: Thursday March 12, 2018 @ 10:45am Report to Same Day Surgery 2nd floor medical mall [facility] Entrance-take elevator on left to 2nd floor.  Check in with surgery information desk.)  Remember: Instructions that are not followed completely may result in serious medical risk, up to and including death, or upon the discretion of your surgeon and anesthesiologist your surgery may need to be rescheduled.    _x___ 1. Do not eat food after midnight the night before your procedure. You may drink clear liquids up to 2 hours before you are scheduled to arrive at the hospital for your procedure.  Do not drink clear liquids within 2 hours of your scheduled arrival to the hospital.  Clear liquids include  --Water or Apple juice without pulp  --Clear carbohydrate beverage such as Gatorade  --Black Coffee or Clear Tea (No milk, no creamers, do not add anything to the coffee or tea Type 1 and type 2 diabetics should only drink water.  No gum chewing or hard candies.     __x__ 2. No Alcohol for 24 hours before or after surgery.   __x__3. No Smoking or e-cigarettes for 24 prior to surgery.  Do not use any chewable tobacco products for at least 6 hour prior to surgery   ____  4. Bring all medications with you on the day of surgery if instructed.    __x__ 5. Notify your doctor if there is any change in your medical condition     (cold, fever, infections).   __x__6. On the morning of surgery brush your teeth with toothpaste and water.  You may rinse your mouth with mouth wash if you wish.  Do not swallow any toothpaste or mouthwash.   Do not wear jewelry, make-up, hairpins, clips or nail polish.  Do not wear lotions, powders, or perfumes. You may wear deodorant.  Do not shave 48 hours prior to surgery.  Do not bring valuables to the hospital.    [facility] is not responsible for any belongings or valuables.               Contacts, dentures or bridgework may not be worn  into surgery.  Leave your suitcase in the car. After surgery it may be brought to your room.  For patients admitted to the hospital, discharge time is determined by your treatment team.   Patients discharged the day of surgery will not be allowed to drive home.  You will need someone to drive you home and stay with you the night of your procedure.    Please read over the following fact sheets that you were given:   [facility] Preparing for Surgery and or MRSA Information   _x___ Take anti-hypertensive listed below, cardiac, seizure, asthma,     anti-reflux and psychiatric medicines. These include:  1. Ranitidine/Zantac  2. Duloxetine/Cymbalta  3. Pantoprazole/Protonix   _x___ Use CHG Soap or sage wipes as directed on instruction sheet   _x___ Follow recommendations from Cardiologist, Pulmonologist or PCP regarding stopping Aspirin, Coumadin, Plavix ,Eliquis, Effient, or Pradaxa, and Pletal.  _x___Stop Anti-inflammatories such as Advil, Aleve, Ibuprofen, Motrin, Naproxen, Naprosyn, Goodies powders or aspirin products. OK to take Tylenol and Celebrex.   _x___ Stop supplements until after surgery.  But may continue Vitamin D, Vitamin B, and multivitamin.

## 2018-03-12 ENCOUNTER — Encounter
Admission: RE | Disposition: A | Discharge status: Home / Self Care | Payer: Self-pay | Source: Ambulatory Visit | Attending: Orthopedic Surgery

## 2018-03-12 ENCOUNTER — Encounter: Payer: Self-pay | Admitting: Anesthesiology

## 2018-03-12 ENCOUNTER — Ambulatory Visit: Payer: Medicare Other | Admitting: Registered Nurse

## 2018-03-12 ENCOUNTER — Ambulatory Visit: Payer: Medicare Other

## 2018-03-12 ENCOUNTER — Ambulatory Visit
Admission: RE | Admit: 2018-03-12 | Discharge: 2018-03-12 | Disposition: A | Discharge status: Home / Self Care | Payer: Medicare Other | Source: Ambulatory Visit | Attending: Orthopedic Surgery | Admitting: Orthopedic Surgery

## 2018-03-12 DIAGNOSIS — I129 Hypertensive chronic kidney disease with stage 1 through stage 4 chronic kidney disease, or unspecified chronic kidney disease: Secondary | ICD-10-CM | POA: Insufficient documentation

## 2018-03-12 DIAGNOSIS — Z419 Encounter for procedure for purposes other than remedying health state, unspecified: Secondary | ICD-10-CM

## 2018-03-12 DIAGNOSIS — I2510 Atherosclerotic heart disease of native coronary artery without angina pectoris: Secondary | ICD-10-CM | POA: Diagnosis not present

## 2018-03-12 DIAGNOSIS — Z9049 Acquired absence of other specified parts of digestive tract: Secondary | ICD-10-CM | POA: Insufficient documentation

## 2018-03-12 DIAGNOSIS — Z86718 Personal history of other venous thrombosis and embolism: Secondary | ICD-10-CM | POA: Insufficient documentation

## 2018-03-12 DIAGNOSIS — Z8249 Family history of ischemic heart disease and other diseases of the circulatory system: Secondary | ICD-10-CM | POA: Insufficient documentation

## 2018-03-12 DIAGNOSIS — N189 Chronic kidney disease, unspecified: Secondary | ICD-10-CM | POA: Insufficient documentation

## 2018-03-12 DIAGNOSIS — Y939 Activity, unspecified: Secondary | ICD-10-CM | POA: Insufficient documentation

## 2018-03-12 DIAGNOSIS — W19XXXA Unspecified fall, initial encounter: Secondary | ICD-10-CM | POA: Insufficient documentation

## 2018-03-12 DIAGNOSIS — Z882 Allergy status to sulfonamides status: Secondary | ICD-10-CM | POA: Insufficient documentation

## 2018-03-12 DIAGNOSIS — E785 Hyperlipidemia, unspecified: Secondary | ICD-10-CM | POA: Insufficient documentation

## 2018-03-12 DIAGNOSIS — S22080A Wedge compression fracture of T11-T12 vertebra, initial encounter for closed fracture: Secondary | ICD-10-CM | POA: Insufficient documentation

## 2018-03-12 DIAGNOSIS — Z7982 Long term (current) use of aspirin: Secondary | ICD-10-CM | POA: Insufficient documentation

## 2018-03-12 DIAGNOSIS — Z981 Arthrodesis status: Secondary | ICD-10-CM | POA: Diagnosis not present

## 2018-03-12 DIAGNOSIS — E782 Mixed hyperlipidemia: Secondary | ICD-10-CM | POA: Diagnosis not present

## 2018-03-12 DIAGNOSIS — Z823 Family history of stroke: Secondary | ICD-10-CM | POA: Insufficient documentation

## 2018-03-12 DIAGNOSIS — Z79899 Other long term (current) drug therapy: Secondary | ICD-10-CM | POA: Insufficient documentation

## 2018-03-12 DIAGNOSIS — M1990 Unspecified osteoarthritis, unspecified site: Secondary | ICD-10-CM | POA: Diagnosis not present

## 2018-03-12 DIAGNOSIS — Z881 Allergy status to other antibiotic agents status: Secondary | ICD-10-CM | POA: Insufficient documentation

## 2018-03-12 DIAGNOSIS — I340 Nonrheumatic mitral (valve) insufficiency: Secondary | ICD-10-CM | POA: Diagnosis not present

## 2018-03-12 DIAGNOSIS — K219 Gastro-esophageal reflux disease without esophagitis: Secondary | ICD-10-CM | POA: Insufficient documentation

## 2018-03-12 DIAGNOSIS — M4854XA Collapsed vertebra, not elsewhere classified, thoracic region, initial encounter for fracture: Secondary | ICD-10-CM | POA: Diagnosis not present

## 2018-03-12 HISTORY — PX: KYPHOPLASTY: SHX5884

## 2018-03-12 SURGERY — KYPHOPLASTY
Anesthesia: General | Site: Spine Thoracic | Wound class: Clean

## 2018-03-12 MED ORDER — ONDANSETRON HCL 4 MG PO TABS: 4.0000 mg | ORAL_TABLET | Freq: Four times a day (QID) | ORAL | Status: DC | PRN

## 2018-03-12 MED ORDER — IOPAMIDOL (ISOVUE-M 200) INJECTION 41%
INTRAMUSCULAR | Status: AC
  Filled 2018-03-12: qty 20

## 2018-03-12 MED ORDER — PROPOFOL 10 MG/ML IV BOLUS
INTRAVENOUS | Status: AC
  Filled 2018-03-12: qty 20

## 2018-03-12 MED ORDER — HYDROCODONE-ACETAMINOPHEN 5-325 MG PO TABS: 1.0000 | ORAL_TABLET | ORAL | Status: DC | PRN

## 2018-03-12 MED ORDER — OXYCODONE HCL 5 MG/5ML PO SOLN: 5.0000 mg | Freq: Once | ORAL | Status: DC | PRN

## 2018-03-12 MED ORDER — BUPIVACAINE-EPINEPHRINE (PF) 0.5% -1:200000 IJ SOLN
INTRAMUSCULAR | Status: AC
Start: 2018-03-12 — End: 2018-03-12
  Filled 2018-03-12: qty 30

## 2018-03-12 MED ORDER — CEFAZOLIN SODIUM-DEXTROSE 2-4 GM/100ML-% IV SOLN
INTRAVENOUS | Status: AC
  Filled 2018-03-12: qty 100

## 2018-03-12 MED ORDER — OXYCODONE HCL 5 MG PO TABS: 5.0000 mg | ORAL_TABLET | Freq: Once | ORAL | Status: DC | PRN

## 2018-03-12 MED ORDER — LIDOCAINE HCL (PF) 1 % IJ SOLN
INTRAMUSCULAR | Status: AC
  Filled 2018-03-12: qty 30

## 2018-03-12 MED ORDER — KETAMINE HCL 50 MG/ML IJ SOLN
INTRAMUSCULAR | Status: AC
  Filled 2018-03-12: qty 10

## 2018-03-12 MED ORDER — KETOROLAC TROMETHAMINE 30 MG/ML IJ SOLN
INTRAMUSCULAR | Status: AC
  Filled 2018-03-12: qty 1

## 2018-03-12 MED ORDER — PROPOFOL 10 MG/ML IV BOLUS
INTRAVENOUS | Status: DC | PRN
  Administered 2018-03-12: 30 mg via INTRAVENOUS

## 2018-03-12 MED ORDER — LACTATED RINGERS IV SOLN
INTRAVENOUS | Status: DC
  Administered 2018-03-12: 12:00:00 via INTRAVENOUS

## 2018-03-12 MED ORDER — FENTANYL CITRATE (PF) 100 MCG/2ML IJ SOLN: 25.0000 ug | INTRAMUSCULAR | Status: DC | PRN

## 2018-03-12 MED ORDER — PROPOFOL 500 MG/50ML IV EMUL
INTRAVENOUS | Status: DC | PRN
  Administered 2018-03-12: 30 ug/kg/min via INTRAVENOUS

## 2018-03-12 MED ORDER — METOCLOPRAMIDE HCL 10 MG PO TABS: 5.0000 mg | ORAL_TABLET | Freq: Three times a day (TID) | ORAL | Status: DC | PRN

## 2018-03-12 MED ORDER — LIDOCAINE HCL (PF) 2 % IJ SOLN
INTRAMUSCULAR | Status: AC
  Filled 2018-03-12: qty 10

## 2018-03-12 MED ORDER — SODIUM CHLORIDE 0.9 % IV SOLN
INTRAVENOUS | Status: DC
Start: 2018-03-12 — End: 2018-03-12

## 2018-03-12 MED ORDER — KETAMINE HCL 50 MG/ML IJ SOLN
INTRAMUSCULAR | Status: DC | PRN
  Administered 2018-03-12: 10 mg via INTRAVENOUS
  Administered 2018-03-12: 5 mg via INTRAVENOUS
  Administered 2018-03-12: 10 mg via INTRAVENOUS

## 2018-03-12 MED ORDER — LIDOCAINE HCL 1 % IJ SOLN
INTRAMUSCULAR | Status: DC | PRN
  Administered 2018-03-12: 30 mL

## 2018-03-12 MED ORDER — ONDANSETRON HCL 4 MG/2ML IJ SOLN: 4.0000 mg | Freq: Four times a day (QID) | INTRAMUSCULAR | Status: DC | PRN

## 2018-03-12 MED ORDER — METOCLOPRAMIDE HCL 5 MG/ML IJ SOLN: 5.0000 mg | Freq: Three times a day (TID) | INTRAMUSCULAR | Status: DC | PRN

## 2018-03-12 MED ORDER — BUPIVACAINE-EPINEPHRINE (PF) 0.5% -1:200000 IJ SOLN
INTRAMUSCULAR | Status: DC | PRN
  Administered 2018-03-12: 20 mL

## 2018-03-12 SURGICAL SUPPLY — 18 items
CEMENT KYPHON CX01A KIT/MIXER (Cement) ×2 IMPLANT
DERMABOND ADVANCED (GAUZE/BANDAGES/DRESSINGS) ×1
DERMABOND ADVANCED .7 DNX12 (GAUZE/BANDAGES/DRESSINGS) ×1 IMPLANT
DEVICE BIOPSY BONE KYPH (INSTRUMENTS) ×2 IMPLANT
DEVICE BIOPSY BONE KYPHX (INSTRUMENTS) IMPLANT
DRAPE C-ARM XRAY 36X54 (DRAPES) ×2 IMPLANT
DURAPREP 26ML APPLICATOR (WOUND CARE) ×2 IMPLANT
GLOVE SURG SYN 9.0  PF PI (GLOVE) ×1
GLOVE SURG SYN 9.0 PF PI (GLOVE) ×1 IMPLANT
GOWN SRG 2XL LVL 4 RGLN SLV (GOWNS) ×1 IMPLANT
GOWN STRL NON-REIN 2XL LVL4 (GOWNS) ×1
GOWN STRL REUS W/ TWL LRG LVL3 (GOWN DISPOSABLE) ×1 IMPLANT
GOWN STRL REUS W/TWL LRG LVL3 (GOWN DISPOSABLE) ×1
PACK KYPHOPLASTY (MISCELLANEOUS) ×2 IMPLANT
STRAP SAFETY 5IN WIDE (MISCELLANEOUS) ×2 IMPLANT
TRAY KYPHOPAK 15/2 EXPRESS (KITS) ×2 IMPLANT
TRAY KYPHOPAK 15/3 EXPRESS 1ST (MISCELLANEOUS) IMPLANT
TRAY KYPHOPAK 20/3 EXPRESS 1ST (MISCELLANEOUS) IMPLANT

## 2018-03-12 NOTE — H&P (Signed)
Chief Complaint  Patient presents with  . Back Pain - Lumbar Area  Discuss MRI of lower back   History of the Present Illness: Amy [name] is a 84 y.o. female here for evaluation of a T12 compression fracture. The patient previously saw Dr. Candelaria Stagers, and she comes back today to review her MRI.   The patient reports that she fell onto her back in her yard on 01/30/2018. She has been taking ibuprofen and Tylenol for pain. She notes she has constant pain. Her pain is relieved when her back is supported. She has significant pain when standing.   She is not on a blood thinning medication. She reports she is in good health otherwise.  I have reviewed past medical, surgical, social and family history, and allergies as documented in the EMR.  Past Medical History: Past Medical History:  Diagnosis Date  . 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 Chronic kidney disease  . Degenerative arthritis  . Diverticulitis  . DJD (degenerative joint disease)  . GERD (gastroesophageal reflux disease)  . Hyperlipidemia, unspecified  . Hypertension  . Mitral regurgitation  . Osteoporosis, post-menopausal  . 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CMS-HCC)  . Right leg DVT (CMS-HCC)  . Shingles  . Stress incontinence   Past Surgical History: Past Surgical History:  Procedure Laterality Date  . Bladder tack  . Breast surgery  . Cataract surgery  . CHOLECYSTECTOMY  . EGD 08/04/2000  . EXCISION GANGLION CYST WRIST PRIMARY  . EXCISION TUMOR SOFT TISSUE NECK  . FLEXIBLE SIGMOIDOSCOPY 05/20/1997  . Neck surgery   Past Family History: Family History  Problem Relation Age of Onset  . Myocardial Infarction (Heart attack) Mother  . Stroke Father  . Myocardial Infarction (Heart attack) Brother  . Stroke Brother   Medications: Current Outpatient Medications Ordered in Epic  Medication Sig Dispense Refill  . acetaminophen (TYLENOL) 500 mg capsule Take 500 mg by mouth every 4 (four) hours as needed for  Fever.  Marland Kitchen aspirin 81 MG EC tablet Take 81 mg by mouth once daily.  Marland Kitchen BESIVANCE 0.6 % instill 1 drop into right eye four times a day as directed AFTER EACHMONTHLY INJECTION 1  . cyanocobalamin (VITAMIN B12) 1000 MCG tablet Take 1,000 mcg by mouth once daily.  . cyclobenzaprine (FLEXERIL) 5 MG tablet Take 5 to 10 mg by mouth three times a day per needed. as needed  . DULoxetine (CYMBALTA) 60 MG DR capsule Take 1 capsule (60 mg total) by mouth once daily. 90 capsule 1  . ERGOCALCIFEROL, VITAMIN D2, (VITAMIN D3 ORAL) Take 1 tablet by mouth once daily.   Marland Kitchen ezetimibe-simvastatin (VYTORIN) 10-20 mg tablet Take 1 tablet by mouth nightly 30 tablet 5  . ezetimibe-simvastatin (VYTORIN) 10-20 mg tablet TAKE 1 TABLET BY MOUTH EVERY NIGHT 30 tablet 5  . light mineral oil-mineral oil (SOOTHE XP) 1-4.5 % Drop Apply to eye.  . losartan (COZAAR) 50 MG tablet Take 1 tablet (50 mg total) by mouth once daily 90 tablet 1  . multivit-min-iron-FA-lutein (CENTRUM SILVER WOMEN) 8 mg iron-400 mcg-300 mcg Tab Take 1 tablet by mouth once daily.  Marland Kitchen omega-3 fatty acids/fish oil 340-1,000 mg capsule Take 2 capsules by mouth 2 (two) times daily.  . pantoprazole (PROTONIX) 40 MG DR tablet TAKE 1 TABLET BY MOUTH ONCE DAILY. 90 tablet 3  . ranitidine (ZANTAC) 150 MG tablet Take 1 tablet (150 mg total) by mouth 2 (two) times daily. 180 tablet 3  . traMADol (ULTRAM) 50 mg tablet Take  1 tablet by mouth every 12 (twelve) hours  . triamterene-hydrochlorothiazide (DYAZIDE) 37.5-25 mg capsule Take 1 capsule by mouth every morning 30 capsule 5   No current Epic-ordered facility-administered medications on file.   Allergies: Allergies  Allergen Reactions  . Doxycycline Rash  . Levofloxacin Nausea And Vomiting  . Sulfa (Sulfonamide Antibiotics) Other (See Comments)  Other Reaction: Throat feels funny    Body mass index is 22.53 kg/m.  Review of Systems:A comprehensive 14 point ROS was performed, reviewed, and the pertinent  orthopaedic findings are documented in the HPI.  Vitals:  03/09/18 1448  BP: 122/76   General Physical Examination:  General/Constitutional: No apparent distress: well-nourished and well developed. Eyes: Pupils equal, round with synchronous movement. Lungs: Clear to auscultation HEENT: Normal Vascular: No edema, swelling or tenderness, except as noted in detailed exam. Cardiac: Heart rate and rhythm is regular. Integumentary: No impressive skin lesions present, except as noted in detailed exam. Neuro/Psych: Normal mood and affect, oriented to person, place and time.  Musculoskeletal Examination: On exam, the patient has no clonus on either lower extremity. She is tender to palpation over T12. She has no rashes on her skin.   Lungs are clear. Heart rate and rhythm is normal. HEENT is normal.  Radiographs: Review of her MRI demonstrates a significant compression fracture present with retropulsion.  Assessment: ICD-10-CM ICD-9-CM  1. T12 compression fracture (CMS-HCC) S22.080A 805.2   Plan: We discussed her condition and treatment options in detail today. Her MRI was reviewed with her as well. I discussed the kyphoplasty procedure with her and her family today. The patient will be scheduled for surgery on Thursday 03/12/2018.   Surgical Risks: The nature of the condition and the proposed procedure has been reviewed in detail with the patient. Surgical versus non-surgical options and prognosis for recovery have been reviewed and the inherent risks and benefits of each have been discussed including the risks of infection, bleeding, injury to nerves / blood vessels / tendons, incomplete relief of symptoms, persisting pain and / or stiffness, loss of function, complex regional pain syndrome, failure of procedure, as appropriate.  Teeth: Normal  Scribe Attestation: I, Candie Mile, am acting as scribe for [employer], MD

## 2018-03-12 NOTE — Anesthesia Preprocedure Evaluation (Signed)
Anesthesia Evaluation  Patient identified by MRN, date of birth, ID band Patient awake    Reviewed: Allergy & Precautions, H&P , NPO status , Patient's Chart, lab work & pertinent test results  History of Anesthesia Complications (+) Family history of anesthesia reaction and history of anesthetic complications  Airway Mallampati: III  TM Distance: <3 FB Neck ROM: limited    Dental  (+) Poor Dentition, Missing, Edentulous Upper, Edentulous Lower, Upper Dentures, Lower Dentures   Pulmonary neg shortness of breath, pneumonia,           Cardiovascular hypertension, (-) angina+ CAD  (-) Past MI      Neuro/Psych PSYCHIATRIC DISORDERS Depression  Neuromuscular disease negative neurological ROS     GI/Hepatic Neg liver ROS, hiatal hernia, GERD  Medicated and Controlled,  Endo/Other  negative endocrine ROS  Renal/GU CRFRenal disease  negative genitourinary   Musculoskeletal  (+) Arthritis ,   Abdominal   Peds  Hematology negative hematology ROS (+)   Anesthesia Other Findings Past Medical History: No date: Arthritis No date: Depression No date: Diverticulitis No date: DVT (deep venous thrombosis) (HCC)     Comment:  Right leg  No date: Family history of adverse reaction to anesthesia     Comment:  PONV sister No date: GERD (gastroesophageal reflux disease) No date: History of hiatal hernia No date: Hyperlipidemia No date: Hypertension No date: Mitral valve regurgitation No date: Neuromuscular disorder (HCC) 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[place]) No date: Pneumonia No date: Renal disorder  Past Surgical History: No date: ABDOMINAL SURGERY 1990: bladder tack No date: BREAST BIOPSY No date: CATARACT EXTRACTION, BILATERAL No date: HERNIA REPAIR 2005: plasmacytoma resection     Comment:  lumbar  BMI    Body Mass Index:  22.47 kg/m      Reproductive/Obstetrics negative OB ROS                              Anesthesia Physical Anesthesia Plan  ASA: III  Anesthesia Plan: General   Post-op Pain Management:    Induction: Intravenous  PONV Risk Score and Plan: Propofol infusion and TIVA  Airway Management Planned: Natural Airway and Nasal Cannula  Additional Equipment:   Intra-op Plan:   Post-operative Plan:   Informed Consent: I have reviewed the patients History and Physical, chart, labs and discussed the procedure including the risks, benefits and alternatives for the proposed anesthesia with the patient or authorized representative who has indicated his/her understanding and acceptance.   Dental Advisory Given  Plan Discussed with: Anesthesiologist, CRNA and Surgeon  Anesthesia Plan Comments: (Patient consented for risks of anesthesia including but not limited to:  - adverse reactions to medications - risk of intubation if required - damage to teeth, lips or other oral mucosa - sore throat or hoarseness - Damage to heart, brain, lungs or loss of life  Patient voiced understanding.)        Anesthesia Quick Evaluation

## 2018-03-12 NOTE — Transfer of Care (Signed)
Immediate Anesthesia Transfer of Care Note  Patient: Amy [name]  Procedure(s) Performed: [name] (N/A Spine Thoracic)  Patient Location: PACU  Anesthesia Type:General  Level of Consciousness: awake, alert  and oriented  Airway & Oxygen Therapy: Patient Spontanous Breathing  Post-op Assessment: Report given to RN and Post -op Vital signs reviewed and stable  Post vital signs: Reviewed and stable  Last Vitals:  Vitals Value Taken Time  BP 145/57 03/12/2018 12:51 PM  Temp 36.7 C 03/12/2018 12:51 PM  Pulse 75 03/12/2018 12:52 PM  Resp 16 03/12/2018 12:52 PM  SpO2 100 % 03/12/2018 12:52 PM  Vitals shown include unvalidated device data.  Last Pain:  Vitals:   03/12/18 1047  TempSrc: Oral  PainSc: 0-No pain         Complications: No apparent anesthesia complications

## 2018-03-12 NOTE — Op Note (Signed)
03/12/2018  12:53 PM  PATIENT:  Amy [name]  84 y.o. female  PRE-OPERATIVE DIAGNOSIS:  T12 COMPRESSION FRACTURE  POST-OPERATIVE DIAGNOSIS:  T12 COMPRESSION FRACTURE  PROCEDURE:  Procedure(s): KYPHOPLASTY-T12 (N/A)  SURGEON: Laurene Footman, MD  ASSISTANTS: None  ANESTHESIA:   local and MAC  EBL:  Total I/O In: 500 [I.V.:500] Out: 10 [Blood:10]  BLOOD ADMINISTERED:none  DRAINS: none   LOCAL MEDICATIONS USED:  MARCAINE    and XYLOCAINE   SPECIMEN:  Source of Specimen:  T12 vertebral body biopsy  DISPOSITION OF SPECIMEN:  PATHOLOGY  COUNTS:  YES  TOURNIQUET:  * No tourniquets in log *  IMPLANTS: Bone cement  DICTATION: .Dragon Dictation patient brought the operating room and after adequate sedation was given she is placed prone with C-arm brought in with good visualization of T12 on both AP and lateral projections.  Local anesthetic was then infiltrated on the left and right sides and subcutaneous tissue 5 cc of 1% Xylocaine on each side.  After having completed appropriate patient identification timeout procedure.  The back was then prepped and draped in repeat timeout procedure carried out.  Spinal needle was then used to get local anesthetic a 50-50 mix of 1% Xylocaine half percent Sensorcaine 20 cc each on each side.  Small incisions were made and trocar advanced in a transpedicular fashion into the vertebral body and biopsy obtained, followed by drilling and inflation of balloon.  Partial correction of her severe deformity was obtained regaining approximately the third to the height of the vertebral body.  Cement was then mixed and was the appropriate consistency approximately 4 cc of bone cement were used to fill the vertebral body without extravasation or evidence of retropulsion.  Dermabond used to close the skin followed by Band-Aids  PLAN OF CARE: Discharge to home after PACU  PATIENT DISPOSITION:  PACU - hemodynamically stable.

## 2018-03-12 NOTE — Anesthesia Postprocedure Evaluation (Signed)
Anesthesia Post Note  Patient: Amy [name]  Procedure(s) Performed: [name] (N/A Spine Thoracic)  Patient location during evaluation: PACU Anesthesia Type: General Level of consciousness: awake and alert Pain management: pain level controlled Vital Signs Assessment: post-procedure vital signs reviewed and stable Respiratory status: spontaneous breathing, nonlabored ventilation, respiratory function stable and patient connected to nasal cannula oxygen Cardiovascular status: blood pressure returned to baseline and stable Postop Assessment: no apparent nausea or vomiting Anesthetic complications: no     Last Vitals:  Vitals Value Taken Time  BP    Temp    Pulse    Resp    SpO2      Last Pain:  Vitals:   03/12/18 1400  TempSrc: Oral  PainSc: 0-No pain                 Precious Haws Rudolpho [place]

## 2018-03-12 NOTE — Anesthesia Post-op Follow-up Note (Signed)
Anesthesia QCDR form completed.        

## 2018-03-12 NOTE — Discharge Instructions (Addendum)
AMBULATORY SURGERY  DISCHARGE INSTRUCTIONS   1) The drugs that you were given will stay in your system until tomorrow so for the next 24 hours you should not:  A) Drive an automobile B) Make any legal decisions C) Drink any alcoholic beverage   2) You may resume regular meals tomorrow.  Today it is better to start with liquids and gradually work up to solid foods.  You may eat anything you prefer, but it is better to start with liquids, then soup and crackers, and gradually work up to solid foods.   3) Please notify your doctor immediately if you have any unusual bleeding, trouble breathing, redness and pain at the surgery site, drainage, fever, or pain not relieved by medication. 4)   5) Your post-operative visit with Dr.                                     is: Date:                        Time:    Please call to schedule your post-operative visit.  6) Additional Instructions:      Take it easy today and resume more normal activities tomorrow as tolerated.  Remove Band-Aids on Saturday okay to shower after that.

## 2018-03-12 NOTE — H&P (Signed)
Reviewed paper H+P, will be scanned into chart. No changes noted.  

## 2018-03-13 LAB — SURGICAL PATHOLOGY

## 2018-03-27 ENCOUNTER — Encounter (INDEPENDENT_AMBULATORY_CARE_PROVIDER_SITE_OTHER): Payer: Medicare Other | Admitting: Ophthalmology

## 2018-03-27 DIAGNOSIS — H43813 Vitreous degeneration, bilateral: Secondary | ICD-10-CM | POA: Diagnosis not present

## 2018-03-27 DIAGNOSIS — H35033 Hypertensive retinopathy, bilateral: Secondary | ICD-10-CM

## 2018-03-27 DIAGNOSIS — I10 Essential (primary) hypertension: Secondary | ICD-10-CM | POA: Diagnosis not present

## 2018-03-27 DIAGNOSIS — S22080A Wedge compression fracture of T11-T12 vertebra, initial encounter for closed fracture: Secondary | ICD-10-CM | POA: Diagnosis not present

## 2018-03-27 DIAGNOSIS — H353231 Exudative age-related macular degeneration, bilateral, with active choroidal neovascularization: Secondary | ICD-10-CM

## 2018-05-05 ENCOUNTER — Encounter (INDEPENDENT_AMBULATORY_CARE_PROVIDER_SITE_OTHER): Payer: Medicare Other | Admitting: Ophthalmology

## 2018-05-05 DIAGNOSIS — H35033 Hypertensive retinopathy, bilateral: Secondary | ICD-10-CM

## 2018-05-05 DIAGNOSIS — H43813 Vitreous degeneration, bilateral: Secondary | ICD-10-CM

## 2018-05-05 DIAGNOSIS — H353231 Exudative age-related macular degeneration, bilateral, with active choroidal neovascularization: Secondary | ICD-10-CM

## 2018-05-05 DIAGNOSIS — I10 Essential (primary) hypertension: Secondary | ICD-10-CM

## 2018-05-20 DIAGNOSIS — I10 Essential (primary) hypertension: Secondary | ICD-10-CM | POA: Diagnosis not present

## 2018-05-20 DIAGNOSIS — R739 Hyperglycemia, unspecified: Secondary | ICD-10-CM | POA: Diagnosis not present

## 2018-05-20 DIAGNOSIS — C9030 Solitary plasmacytoma not having achieved remission: Secondary | ICD-10-CM | POA: Diagnosis not present

## 2018-05-20 DIAGNOSIS — I2510 Atherosclerotic heart disease of native coronary artery without angina pectoris: Secondary | ICD-10-CM | POA: Diagnosis not present

## 2018-05-20 DIAGNOSIS — I340 Nonrheumatic mitral (valve) insufficiency: Secondary | ICD-10-CM | POA: Diagnosis not present

## 2018-05-20 DIAGNOSIS — S22080A Wedge compression fracture of T11-T12 vertebra, initial encounter for closed fracture: Secondary | ICD-10-CM | POA: Diagnosis not present

## 2018-05-20 DIAGNOSIS — F320 Major depressive disorder, single episode, mild: Secondary | ICD-10-CM | POA: Diagnosis not present

## 2018-05-20 DIAGNOSIS — E782 Mixed hyperlipidemia: Secondary | ICD-10-CM | POA: Diagnosis not present

## 2018-05-20 DIAGNOSIS — K219 Gastro-esophageal reflux disease without esophagitis: Secondary | ICD-10-CM | POA: Diagnosis not present

## 2018-05-20 DIAGNOSIS — Z0000 Encounter for general adult medical examination without abnormal findings: Secondary | ICD-10-CM | POA: Diagnosis not present

## 2018-05-20 DIAGNOSIS — Z9889 Other specified postprocedural states: Secondary | ICD-10-CM | POA: Diagnosis not present

## 2018-05-20 DIAGNOSIS — M810 Age-related osteoporosis without current pathological fracture: Secondary | ICD-10-CM | POA: Diagnosis not present

## 2018-05-27 DIAGNOSIS — M8588 Other specified disorders of bone density and structure, other site: Secondary | ICD-10-CM | POA: Diagnosis not present

## 2018-05-28 DIAGNOSIS — E782 Mixed hyperlipidemia: Secondary | ICD-10-CM | POA: Diagnosis not present

## 2018-05-28 DIAGNOSIS — I340 Nonrheumatic mitral (valve) insufficiency: Secondary | ICD-10-CM | POA: Diagnosis not present

## 2018-05-28 DIAGNOSIS — I2510 Atherosclerotic heart disease of native coronary artery without angina pectoris: Secondary | ICD-10-CM | POA: Diagnosis not present

## 2018-05-28 DIAGNOSIS — I10 Essential (primary) hypertension: Secondary | ICD-10-CM | POA: Diagnosis not present

## 2018-06-01 ENCOUNTER — Other Ambulatory Visit: Payer: Self-pay | Admitting: Orthopedic Surgery

## 2018-06-01 DIAGNOSIS — M545 Low back pain, unspecified: Secondary | ICD-10-CM

## 2018-06-10 ENCOUNTER — Encounter (INDEPENDENT_AMBULATORY_CARE_PROVIDER_SITE_OTHER): Payer: Medicare Other | Admitting: Ophthalmology

## 2018-06-10 DIAGNOSIS — H43813 Vitreous degeneration, bilateral: Secondary | ICD-10-CM

## 2018-06-10 DIAGNOSIS — I10 Essential (primary) hypertension: Secondary | ICD-10-CM | POA: Diagnosis not present

## 2018-06-10 DIAGNOSIS — H353231 Exudative age-related macular degeneration, bilateral, with active choroidal neovascularization: Secondary | ICD-10-CM | POA: Diagnosis not present

## 2018-06-10 DIAGNOSIS — H35033 Hypertensive retinopathy, bilateral: Secondary | ICD-10-CM | POA: Diagnosis not present

## 2018-06-15 ENCOUNTER — Ambulatory Visit
Admission: RE | Admit: 2018-06-15 | Discharge: 2018-06-15 | Disposition: A | Discharge status: Home / Self Care | Payer: Medicare Other | Source: Ambulatory Visit | Attending: Orthopedic Surgery | Admitting: Orthopedic Surgery

## 2018-06-15 DIAGNOSIS — M545 Low back pain, unspecified: Secondary | ICD-10-CM

## 2018-06-15 DIAGNOSIS — M4854XA Collapsed vertebra, not elsewhere classified, thoracic region, initial encounter for fracture: Secondary | ICD-10-CM | POA: Insufficient documentation

## 2018-06-15 DIAGNOSIS — S22080A Wedge compression fracture of T11-T12 vertebra, initial encounter for closed fracture: Secondary | ICD-10-CM | POA: Diagnosis not present

## 2018-06-23 ENCOUNTER — Other Ambulatory Visit: Payer: Self-pay

## 2018-06-23 ENCOUNTER — Encounter
Admission: RE | Admit: 2018-06-23 | Discharge: 2018-06-23 | Disposition: A | Discharge status: Home / Self Care | Payer: Medicare Other | Source: Ambulatory Visit | Attending: Orthopedic Surgery | Admitting: Orthopedic Surgery

## 2018-06-23 DIAGNOSIS — Z01818 Encounter for other preprocedural examination: Secondary | ICD-10-CM | POA: Diagnosis not present

## 2018-06-23 DIAGNOSIS — M4854XA Collapsed vertebra, not elsewhere classified, thoracic region, initial encounter for fracture: Secondary | ICD-10-CM | POA: Insufficient documentation

## 2018-06-23 HISTORY — DX: Dyspnea, unspecified: R06.00

## 2018-06-23 LAB — BASIC METABOLIC PANEL WITH GFR
Anion gap: 10 (ref 5–15)
BUN: 23 mg/dL (ref 8–23)
CO2: 27 mmol/L (ref 22–32)
Calcium: 8.9 mg/dL (ref 8.9–10.3)
Chloride: 105 mmol/L (ref 98–111)
Creatinine, Ser: 0.72 mg/dL (ref 0.44–1.00)
GFR calc Af Amer: >60 mL/min (ref >60)
GFR calc non Af Amer: >60 mL/min (ref >60)
Glucose, Bld: 105 mg/dL — ABNORMAL HIGH (ref 70–99)
Potassium: 3.9 mmol/L (ref 3.5–5.1)
Sodium: 142 mmol/L (ref 135–145)

## 2018-06-23 LAB — CBC
HCT: 39.7 % (ref 35.0–47.0)
Hemoglobin: 13.4 g/dL (ref 12.0–16.0)
MCH: 29.4 pg (ref 26.0–34.0)
MCHC: 33.8 g/dL (ref 32.0–36.0)
MCV: 87 fL (ref 80.0–100.0)
Platelets: 195 K/uL (ref 150–440)
RBC: 4.56 MIL/uL (ref 3.80–5.20)
RDW: 14.7 % — ABNORMAL HIGH (ref 11.5–14.5)
WBC: 7.1 K/uL (ref 3.6–11.0)

## 2018-06-23 LAB — SURGICAL PCR SCREEN
MRSA, PCR: NEGATIVE
Staphylococcus aureus: NEGATIVE

## 2018-06-23 NOTE — Patient Instructions (Signed)
Your procedure is scheduled on: 06/30/18 Tues Report to Same Day Surgery 2nd floor medical mall [facility] Entrance-take elevator on left to 2nd floor.  Check in with surgery information desk.) To find out your arrival time please call [phone] between 1PM - 3PM on 7/8/199 Mon Remember: Instructions that are not followed completely may result in serious medical risk, up to and including death, or upon the discretion of your surgeon and anesthesiologist your surgery may need to be rescheduled.    _x___ 1. Do not eat food after midnight the night before your procedure. You may drink clear liquids up to 2 hours before you are scheduled to arrive at the hospital for your procedure.  Do not drink clear liquids within 2 hours of your scheduled arrival to the hospital.  Clear liquids include  --Water or Apple juice without pulp  --Clear carbohydrate beverage such as ClearFast or Gatorade  --Black Coffee or Clear Tea (No milk, no creamers, do not add anything to                  the coffee or Tea Type 1 and type 2 diabetics should only drink water.  No gum chewing or hard candies.     __x__ 2. No Alcohol for 24 hours before or after surgery.   __x__3. No Smoking or e-cigarettes for 24 prior to surgery.  Do not use any chewable tobacco products for at least 6 hour prior to surgery   ____  4. Bring all medications with you on the day of surgery if instructed.    __x__ 5. Notify your doctor if there is any change in your medical condition     (cold, fever, infections).    x___6. On the morning of surgery brush your teeth with toothpaste and water.  You may rinse your mouth with mouth wash if you wish.  Do not swallow any toothpaste or mouthwash.   Do not wear jewelry, make-up, hairpins, clips or nail polish.  Do not wear lotions, powders, or perfumes. You may wear deodorant.  Do not shave 48 hours prior to surgery. Men may shave face and neck.  Do not bring valuables to the hospital.     [facility] is not responsible for any belongings or valuables.               Contacts, dentures or bridgework may not be worn into surgery.  Leave your suitcase in the car. After surgery it may be brought to your room.  For patients admitted to the hospital, discharge time is determined by your                       treatment team.  _  Patients discharged the day of surgery will not be allowed to drive home.  You will need someone to drive you home and stay with you the night of your procedure.    Please read over the following fact sheets that you were given:   [facility] Preparing for Surgery and or MRSA Information   _x___ Take anti-hypertensive listed below, cardiac, seizure, asthma,     anti-reflux and psychiatric medicines. These include:  1. DULoxetine (CYMBALTA) 60 MG capsule  2.pantoprazole (PROTONIX) 40 MG tablet  3.ranitidine (ZANTAC) 150 MG tablet  4.  5.  6.  ____Fleets enema or Magnesium Citrate as directed.   _x___ Use CHG Soap or sage wipes as directed on instruction sheet   ____ Use inhalers on the  day of surgery and bring to hospital day of surgery  ____ Stop Metformin and Janumet 2 days prior to surgery.    ____ Take 1/2 of usual insulin dose the night before surgery and none on the morning     surgery.   _x___ Follow recommendations from Cardiologist, Pulmonologist or PCP regarding          stopping Aspirin, Coumadin, Plavix ,Eliquis, Effient, or Pradaxa, and Pletal. Stop aspirin today if ok with Dr Ubaldo Glassing.  X____Stop Anti-inflammatories such as Advil, Aleve, Ibuprofen, Motrin, Naproxen, Naprosyn, Goodies powders or aspirin products. OK to take Tylenol and                          Celebrex.   _x___ Stop supplements until after surgery.  But may continue Vitamin D, Vitamin B,       and multivitamin. Stop fish oil today   ____ Bring C-Pap to the hospital.

## 2018-06-26 DIAGNOSIS — S22080A Wedge compression fracture of T11-T12 vertebra, initial encounter for closed fracture: Secondary | ICD-10-CM | POA: Diagnosis not present

## 2018-06-30 ENCOUNTER — Other Ambulatory Visit: Payer: Self-pay

## 2018-06-30 ENCOUNTER — Ambulatory Visit: Payer: Medicare Other

## 2018-06-30 ENCOUNTER — Ambulatory Visit: Payer: Medicare Other | Admitting: Anesthesiology

## 2018-06-30 ENCOUNTER — Encounter
Admission: RE | Disposition: A | Discharge status: Home / Self Care | Payer: Self-pay | Source: Ambulatory Visit | Attending: Orthopedic Surgery

## 2018-06-30 ENCOUNTER — Ambulatory Visit
Admission: RE | Admit: 2018-06-30 | Discharge: 2018-06-30 | Disposition: A | Discharge status: Home / Self Care | Payer: Medicare Other | Source: Ambulatory Visit | Attending: Orthopedic Surgery | Admitting: Orthopedic Surgery

## 2018-06-30 DIAGNOSIS — M4854XA Collapsed vertebra, not elsewhere classified, thoracic region, initial encounter for fracture: Secondary | ICD-10-CM | POA: Insufficient documentation

## 2018-06-30 DIAGNOSIS — E782 Mixed hyperlipidemia: Secondary | ICD-10-CM | POA: Diagnosis not present

## 2018-06-30 DIAGNOSIS — S32000A Wedge compression fracture of unspecified lumbar vertebra, initial encounter for closed fracture: Secondary | ICD-10-CM | POA: Diagnosis not present

## 2018-06-30 DIAGNOSIS — Z7982 Long term (current) use of aspirin: Secondary | ICD-10-CM | POA: Diagnosis not present

## 2018-06-30 DIAGNOSIS — I2510 Atherosclerotic heart disease of native coronary artery without angina pectoris: Secondary | ICD-10-CM | POA: Diagnosis not present

## 2018-06-30 DIAGNOSIS — K219 Gastro-esophageal reflux disease without esophagitis: Secondary | ICD-10-CM | POA: Diagnosis not present

## 2018-06-30 DIAGNOSIS — S22000A Wedge compression fracture of unspecified thoracic vertebra, initial encounter for closed fracture: Secondary | ICD-10-CM | POA: Diagnosis not present

## 2018-06-30 DIAGNOSIS — E785 Hyperlipidemia, unspecified: Secondary | ICD-10-CM | POA: Insufficient documentation

## 2018-06-30 DIAGNOSIS — Z419 Encounter for procedure for purposes other than remedying health state, unspecified: Secondary | ICD-10-CM

## 2018-06-30 DIAGNOSIS — I10 Essential (primary) hypertension: Secondary | ICD-10-CM | POA: Diagnosis not present

## 2018-06-30 DIAGNOSIS — Z79899 Other long term (current) drug therapy: Secondary | ICD-10-CM | POA: Diagnosis not present

## 2018-06-30 DIAGNOSIS — S22080A Wedge compression fracture of T11-T12 vertebra, initial encounter for closed fracture: Secondary | ICD-10-CM | POA: Diagnosis not present

## 2018-06-30 HISTORY — PX: KYPHOPLASTY: SHX5884

## 2018-06-30 SURGERY — KYPHOPLASTY
Anesthesia: General | Wound class: Clean

## 2018-06-30 MED ORDER — OXYCODONE HCL 5 MG/5ML PO SOLN: 5.0000 mg | Freq: Once | ORAL | Status: DC | PRN

## 2018-06-30 MED ORDER — LACTATED RINGERS IV SOLN
INTRAVENOUS | Status: DC
  Administered 2018-06-30: 11:00:00 via INTRAVENOUS

## 2018-06-30 MED ORDER — LIDOCAINE HCL 1 % IJ SOLN
INTRAMUSCULAR | Status: DC | PRN
  Administered 2018-06-30 (×2): 10 mL

## 2018-06-30 MED ORDER — PROPOFOL 500 MG/50ML IV EMUL
INTRAVENOUS | Status: AC
  Filled 2018-06-30: qty 50

## 2018-06-30 MED ORDER — CEFAZOLIN SODIUM-DEXTROSE 2-4 GM/100ML-% IV SOLN
2.0000 g | Freq: Once | INTRAVENOUS | Status: AC
  Administered 2018-06-30: 2 g via INTRAVENOUS

## 2018-06-30 MED ORDER — KETAMINE HCL 50 MG/ML IJ SOLN
INTRAMUSCULAR | Status: AC
  Filled 2018-06-30: qty 10

## 2018-06-30 MED ORDER — PROPOFOL 10 MG/ML IV BOLUS
INTRAVENOUS | Status: DC | PRN
  Administered 2018-06-30: 30 mg via INTRAVENOUS

## 2018-06-30 MED ORDER — HYDROCODONE-ACETAMINOPHEN 5-325 MG PO TABS: 1.0000 | ORAL_TABLET | Freq: Four times a day (QID) | ORAL | 0 refills | Status: DC | PRN

## 2018-06-30 MED ORDER — PROPOFOL 500 MG/50ML IV EMUL
INTRAVENOUS | Status: DC | PRN
  Administered 2018-06-30: 40 ug/kg/min via INTRAVENOUS

## 2018-06-30 MED ORDER — OXYCODONE HCL 5 MG PO TABS: 5.0000 mg | ORAL_TABLET | Freq: Once | ORAL | Status: DC | PRN

## 2018-06-30 MED ORDER — KETAMINE HCL 50 MG/ML IJ SOLN
INTRAMUSCULAR | Status: DC | PRN
  Administered 2018-06-30: 50 mg via INTRAMUSCULAR

## 2018-06-30 MED ORDER — BUPIVACAINE-EPINEPHRINE (PF) 0.5% -1:200000 IJ SOLN
INTRAMUSCULAR | Status: DC | PRN
  Administered 2018-06-30: 10 mL via PERINEURAL

## 2018-06-30 MED ORDER — FENTANYL CITRATE (PF) 100 MCG/2ML IJ SOLN: 25.0000 ug | INTRAMUSCULAR | Status: DC | PRN

## 2018-06-30 MED ORDER — IOPAMIDOL (ISOVUE-M 200) INJECTION 41%
INTRAMUSCULAR | Status: DC | PRN
  Administered 2018-06-30: 40 mL

## 2018-06-30 MED ORDER — CEFAZOLIN SODIUM-DEXTROSE 2-4 GM/100ML-% IV SOLN
INTRAVENOUS | Status: AC
  Filled 2018-06-30: qty 100

## 2018-06-30 SURGICAL SUPPLY — 16 items
CEMENT KYPHON CX01A KIT/MIXER (Cement) ×3 IMPLANT
DERMABOND ADVANCED (GAUZE/BANDAGES/DRESSINGS) ×2
DERMABOND ADVANCED .7 DNX12 (GAUZE/BANDAGES/DRESSINGS) ×1 IMPLANT
DEVICE BIOPSY BONE KYPHX (INSTRUMENTS) ×3 IMPLANT
DRAPE C-ARM XRAY 36X54 (DRAPES) ×3 IMPLANT
DURAPREP 26ML APPLICATOR (WOUND CARE) ×3 IMPLANT
GLOVE SURG SYN 9.0  PF PI (GLOVE) ×2
GLOVE SURG SYN 9.0 PF PI (GLOVE) ×1 IMPLANT
GOWN SRG 2XL LVL 4 RGLN SLV (GOWNS) ×1 IMPLANT
GOWN STRL NON-REIN 2XL LVL4 (GOWNS) ×2
GOWN STRL REUS W/ TWL LRG LVL3 (GOWN DISPOSABLE) ×1 IMPLANT
GOWN STRL REUS W/TWL LRG LVL3 (GOWN DISPOSABLE) ×2
PACK KYPHOPLASTY (MISCELLANEOUS) ×3 IMPLANT
STRAP SAFETY 5IN WIDE (MISCELLANEOUS) ×3 IMPLANT
TRAY KYPHOPAK 15/3 EXPRESS 1ST (MISCELLANEOUS) ×3 IMPLANT
TRAY KYPHOPAK 20/3 EXPRESS 1ST (MISCELLANEOUS) IMPLANT

## 2018-06-30 NOTE — H&P (Signed)
Reviewed paper H+P, will be scanned into chart. No changes noted.  

## 2018-06-30 NOTE — Discharge Instructions (Addendum)
Pain medicine as directed.  Take it easy today and tomorrow and resume more normal activities on Thursday.  Remove Band-Aid on Thursday okay to shower after that.  AMBULATORY SURGERY  DISCHARGE INSTRUCTIONS   1) The drugs that you were given will stay in your system until tomorrow so for the next 24 hours you should not:  A) Drive an automobile B) Make any legal decisions C) Drink any alcoholic beverage   2) You may resume regular meals tomorrow.  Today it is better to start with liquids and gradually work up to solid foods.  You may eat anything you prefer, but it is better to start with liquids, then soup and crackers, and gradually work up to solid foods.   3) Please notify your doctor immediately if you have any unusual bleeding, trouble breathing, redness and pain at the surgery site, drainage, fever, or pain not relieved by medication.    4) Additional Instructions:        Please contact your physician with any problems or Same Day Surgery at [phone], Monday through Friday 6 am to 4 pm, or [place] at [facility] number at [phone].

## 2018-06-30 NOTE — Transfer of Care (Signed)
Immediate Anesthesia Transfer of Care Note  Patient: Amy [name]  Procedure(s) Performed: Marcos Eke (N/A )  Patient Location: PACU  Anesthesia Type:General  Level of Consciousness: sedated  Airway & Oxygen Therapy: Patient Spontanous Breathing and Patient connected to nasal cannula oxygen  Post-op Assessment: Report given to RN and Post -op Vital signs reviewed and stable  Post vital signs: Reviewed and stable  Last Vitals:  Vitals Value Taken Time  BP 141/70 06/30/2018  1:22 PM  Temp    Pulse    Resp    SpO2    Vitals shown include unvalidated device data.  Last Pain:  Vitals:   06/30/18 1034  TempSrc: Temporal  PainSc: 0-No pain         Complications: No apparent anesthesia complications

## 2018-06-30 NOTE — Anesthesia Procedure Notes (Signed)
Date/Time: 06/30/2018 12:47 PM Performed by: Nelda Marseille, CRNA Pre-anesthesia Checklist: Patient identified, Emergency Drugs available, Suction available, Patient being monitored and Timeout performed Oxygen Delivery Method: Nasal cannula

## 2018-06-30 NOTE — Anesthesia Postprocedure Evaluation (Signed)
Anesthesia Post Note  Patient: Amy [name]  Procedure(s) Performed: Marcos Eke (N/A )  Patient location during evaluation: PACU Anesthesia Type: General Level of consciousness: awake and alert Pain management: pain level controlled Vital Signs Assessment: post-procedure vital signs reviewed and stable Respiratory status: spontaneous breathing, nonlabored ventilation, respiratory function stable and patient connected to nasal cannula oxygen Cardiovascular status: blood pressure returned to baseline and stable Postop Assessment: no apparent nausea or vomiting Anesthetic complications: no     Last Vitals:  Vitals:   06/30/18 1403 06/30/18 1416  BP: (!) 157/69 (!) 174/69  Pulse: 62 66  Resp: 20 20  Temp: 36.7 C (!) 36.3 C  SpO2: 100% 98%    Last Pain:  Vitals:   06/30/18 1416  TempSrc: Temporal  PainSc: 0-No pain                 Precious Haws [place]

## 2018-06-30 NOTE — Anesthesia Preprocedure Evaluation (Signed)
Anesthesia Evaluation  Patient identified by MRN, date of birth, ID band Patient awake    Reviewed: Allergy & Precautions, H&P , NPO status , Patient's Chart, lab work & pertinent test results  History of Anesthesia Complications (+) Family history of anesthesia reaction and history of anesthetic complications  Airway Mallampati: III  TM Distance: <3 FB Neck ROM: limited    Dental  (+) Poor Dentition, Missing, Lower Dentures, Upper Dentures, Edentulous Lower, Edentulous Upper   Pulmonary shortness of breath and with exertion, pneumonia,           Cardiovascular Exercise Tolerance: Poor hypertension, (-) angina+ CAD  (-) Past MI      Neuro/Psych PSYCHIATRIC DISORDERS Depression  Neuromuscular disease    GI/Hepatic Neg liver ROS, hiatal hernia, GERD  Medicated and Controlled,  Endo/Other  negative endocrine ROS  Renal/GU Renal disease  negative genitourinary   Musculoskeletal  (+) Arthritis ,   Abdominal   Peds  Hematology negative hematology ROS (+)   Anesthesia Other Findings Past Medical History: No date: Arthritis No date: Depression No date: Diverticulitis No date: DVT (deep venous thrombosis) (HCC)     Comment:  Right leg  No date: Dyspnea No date: Family history of adverse reaction to anesthesia     Comment:  PONV sister No date: GERD (gastroesophageal reflux disease) No date: History of hiatal hernia No date: Hyperlipidemia No date: Hypertension No date: Mitral valve regurgitation No date: Neuromuscular disorder (HCC) 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[place]) No date: Pneumonia No date: Renal disorder  Past Surgical History: No date: ABDOMINAL HYSTERECTOMY No date: ABDOMINAL SURGERY 1990: bladder tack 1974: BREAST BIOPSY No date: CATARACT EXTRACTION, BILATERAL No date: CERVICAL SPINE SURGERY     Comment:  tumor removed No date: HERNIA REPAIR 03/12/2018: KYPHOPLASTY; N/A     Comment:  Procedure:  KYPHOPLASTY-T12;  Surgeon: Hessie Knows, MD;              Location: [facility] ORS;  Service: Orthopedics;  Laterality:               N/A; 2005: plasmacytoma resection     Comment:  lumbar  BMI    Body Mass Index:  22.63 kg/m      Reproductive/Obstetrics negative OB ROS                             Anesthesia Physical Anesthesia Plan  ASA: III  Anesthesia Plan: General   Post-op Pain Management:    Induction: Intravenous  PONV Risk Score and Plan: Propofol infusion and TIVA  Airway Management Planned: Natural Airway and Nasal Cannula  Additional Equipment:   Intra-op Plan:   Post-operative Plan:   Informed Consent: I have reviewed the patients History and Physical, chart, labs and discussed the procedure including the risks, benefits and alternatives for the proposed anesthesia with the patient or authorized representative who has indicated his/her understanding and acceptance.   Dental Advisory Given  Plan Discussed with: Anesthesiologist, CRNA and Surgeon  Anesthesia Plan Comments: (Patient consented for risks of anesthesia including but not limited to:  - adverse reactions to medications - risk of intubation if required - damage to teeth, lips or other oral mucosa - sore throat or hoarseness - Damage to heart, brain, lungs or loss of life  Patient voiced understanding.)        Anesthesia Quick Evaluation

## 2018-06-30 NOTE — Anesthesia Post-op Follow-up Note (Signed)
Anesthesia QCDR form completed.        

## 2018-06-30 NOTE — Op Note (Signed)
06/30/2018  1:17 PM  PATIENT:  Amy [name]  84 y.o. female  PRE-OPERATIVE DIAGNOSIS:  wedge compression fracture T11  POST-OPERATIVE DIAGNOSIS:  wedge compression fracture same  PROCEDURE:  Procedure(s): KYPHOPLASTY-TL (N/A)  SURGEON: Laurene Footman, MD  ASSISTANTS: None  ANESTHESIA:   local and MAC  EBL:  Total I/O In: 400 [I.V.:400] Out: -   BLOOD ADMINISTERED:none  DRAINS: none   LOCAL MEDICATIONS USED:  MARCAINE    and XYLOCAINE   SPECIMEN:  Source of Specimen:  T11 vertebral body  DISPOSITION OF SPECIMEN:  PATHOLOGY  COUNTS:  YES  TOURNIQUET:  * No tourniquets in log *  IMPLANTS: Bone cement  DICTATION: .Dragon Dictation  patient was brought to the operating room and after adequate sedation was given she was placed prone.  Serum was brought into good visualization of T11 was obtained with prior T12 kyphoplasty.  After appropriate patient identification and timeout procedures were completed, 5 cc 1% Xylocaine was infiltrated on the right side of T11 The back was then prepped and draped in the usual sterile manner and repeat timeout procedure carried out.  Local anesthetic was infiltrated down to the pedicle on the right side of the 11 levels.  A 50-50 mix of 1% Xylocaine half percent Sensorcaine with epinephrine was used.  After allowing this to set a small incision was made at T11 with extrapedicular advancement into the vertebral body.  Biopsy was obtained Drilling was carried out followed by inflation of the balloon with 2-1/2 cc of T11.  Cement was mixed and was the appropriate consistency approximately 3 cc.    After the cement had set the  trochars were removed without extravasation.  The wounds are closed with Dermabond and Band-Aid applied    PLAN OF CARE: Discharge to home after PACU  PATIENT DISPOSITION:  PACU - hemodynamically stable.

## 2018-07-01 ENCOUNTER — Encounter: Payer: Self-pay | Admitting: Orthopedic Surgery

## 2018-07-01 LAB — SURGICAL PATHOLOGY

## 2018-07-15 ENCOUNTER — Encounter (INDEPENDENT_AMBULATORY_CARE_PROVIDER_SITE_OTHER): Payer: Medicare Other | Admitting: Ophthalmology

## 2018-07-15 DIAGNOSIS — H43813 Vitreous degeneration, bilateral: Secondary | ICD-10-CM | POA: Diagnosis not present

## 2018-07-15 DIAGNOSIS — H35033 Hypertensive retinopathy, bilateral: Secondary | ICD-10-CM | POA: Diagnosis not present

## 2018-07-15 DIAGNOSIS — I10 Essential (primary) hypertension: Secondary | ICD-10-CM | POA: Diagnosis not present

## 2018-07-15 DIAGNOSIS — H353231 Exudative age-related macular degeneration, bilateral, with active choroidal neovascularization: Secondary | ICD-10-CM

## 2018-08-19 ENCOUNTER — Encounter (INDEPENDENT_AMBULATORY_CARE_PROVIDER_SITE_OTHER): Payer: Medicare Other | Admitting: Ophthalmology

## 2018-09-23 ENCOUNTER — Encounter (INDEPENDENT_AMBULATORY_CARE_PROVIDER_SITE_OTHER): Payer: Medicare Other | Admitting: Ophthalmology

## 2018-09-23 DIAGNOSIS — H353231 Exudative age-related macular degeneration, bilateral, with active choroidal neovascularization: Secondary | ICD-10-CM | POA: Diagnosis not present

## 2018-09-23 DIAGNOSIS — I10 Essential (primary) hypertension: Secondary | ICD-10-CM

## 2018-09-23 DIAGNOSIS — H43813 Vitreous degeneration, bilateral: Secondary | ICD-10-CM

## 2018-09-23 DIAGNOSIS — H35033 Hypertensive retinopathy, bilateral: Secondary | ICD-10-CM | POA: Diagnosis not present

## 2018-10-15 DIAGNOSIS — Z23 Encounter for immunization: Secondary | ICD-10-CM | POA: Diagnosis not present

## 2018-11-04 ENCOUNTER — Encounter (INDEPENDENT_AMBULATORY_CARE_PROVIDER_SITE_OTHER): Payer: Medicare Other | Admitting: Ophthalmology

## 2018-11-04 DIAGNOSIS — H43813 Vitreous degeneration, bilateral: Secondary | ICD-10-CM | POA: Diagnosis not present

## 2018-11-04 DIAGNOSIS — H35033 Hypertensive retinopathy, bilateral: Secondary | ICD-10-CM | POA: Diagnosis not present

## 2018-11-04 DIAGNOSIS — I10 Essential (primary) hypertension: Secondary | ICD-10-CM | POA: Diagnosis not present

## 2018-11-04 DIAGNOSIS — H353231 Exudative age-related macular degeneration, bilateral, with active choroidal neovascularization: Secondary | ICD-10-CM

## 2018-11-23 DIAGNOSIS — M810 Age-related osteoporosis without current pathological fracture: Secondary | ICD-10-CM | POA: Diagnosis not present

## 2018-11-23 DIAGNOSIS — I2510 Atherosclerotic heart disease of native coronary artery without angina pectoris: Secondary | ICD-10-CM | POA: Diagnosis not present

## 2018-11-23 DIAGNOSIS — E782 Mixed hyperlipidemia: Secondary | ICD-10-CM | POA: Diagnosis not present

## 2018-11-23 DIAGNOSIS — I10 Essential (primary) hypertension: Secondary | ICD-10-CM | POA: Diagnosis not present

## 2018-11-23 DIAGNOSIS — F339 Major depressive disorder, recurrent, unspecified: Secondary | ICD-10-CM | POA: Diagnosis not present

## 2018-11-23 DIAGNOSIS — K219 Gastro-esophageal reflux disease without esophagitis: Secondary | ICD-10-CM | POA: Diagnosis not present

## 2018-11-23 DIAGNOSIS — C9031 Solitary plasmacytoma in remission: Secondary | ICD-10-CM | POA: Diagnosis not present

## 2018-11-23 DIAGNOSIS — Z79899 Other long term (current) drug therapy: Secondary | ICD-10-CM | POA: Diagnosis not present

## 2018-11-23 DIAGNOSIS — Z9889 Other specified postprocedural states: Secondary | ICD-10-CM | POA: Diagnosis not present

## 2018-11-23 DIAGNOSIS — I340 Nonrheumatic mitral (valve) insufficiency: Secondary | ICD-10-CM | POA: Diagnosis not present

## 2018-12-02 DIAGNOSIS — I10 Essential (primary) hypertension: Secondary | ICD-10-CM | POA: Diagnosis not present

## 2018-12-02 DIAGNOSIS — I340 Nonrheumatic mitral (valve) insufficiency: Secondary | ICD-10-CM | POA: Diagnosis not present

## 2018-12-02 DIAGNOSIS — E782 Mixed hyperlipidemia: Secondary | ICD-10-CM | POA: Diagnosis not present

## 2018-12-02 DIAGNOSIS — Z86718 Personal history of other venous thrombosis and embolism: Secondary | ICD-10-CM | POA: Diagnosis not present

## 2018-12-02 DIAGNOSIS — I2510 Atherosclerotic heart disease of native coronary artery without angina pectoris: Secondary | ICD-10-CM | POA: Diagnosis not present

## 2018-12-04 ENCOUNTER — Inpatient Hospital Stay: Payer: Medicare Other

## 2018-12-04 ENCOUNTER — Encounter: Payer: Self-pay | Admitting: Oncology

## 2018-12-04 ENCOUNTER — Encounter (INDEPENDENT_AMBULATORY_CARE_PROVIDER_SITE_OTHER): Payer: Self-pay

## 2018-12-04 ENCOUNTER — Other Ambulatory Visit: Payer: Self-pay

## 2018-12-04 ENCOUNTER — Inpatient Hospital Stay: Payer: Medicare Other | Admitting: Oncology

## 2018-12-04 ENCOUNTER — Inpatient Hospital Stay: Payer: Medicare Other | Attending: Oncology | Admitting: Oncology

## 2018-12-04 VITALS — BP 113/72 | HR 87 | Temp 98.4°F | Resp 18 | Ht 63.0 in | Wt 138.0 lb

## 2018-12-04 DIAGNOSIS — K449 Diaphragmatic hernia without obstruction or gangrene: Secondary | ICD-10-CM | POA: Diagnosis not present

## 2018-12-04 DIAGNOSIS — Z8579 Personal history of other malignant neoplasms of lymphoid, hematopoietic and related tissues: Secondary | ICD-10-CM | POA: Insufficient documentation

## 2018-12-04 DIAGNOSIS — R7989 Other specified abnormal findings of blood chemistry: Secondary | ICD-10-CM

## 2018-12-04 DIAGNOSIS — M129 Arthropathy, unspecified: Secondary | ICD-10-CM | POA: Diagnosis not present

## 2018-12-04 DIAGNOSIS — E785 Hyperlipidemia, unspecified: Secondary | ICD-10-CM | POA: Diagnosis not present

## 2018-12-04 DIAGNOSIS — I10 Essential (primary) hypertension: Secondary | ICD-10-CM | POA: Insufficient documentation

## 2018-12-04 DIAGNOSIS — M542 Cervicalgia: Secondary | ICD-10-CM | POA: Diagnosis not present

## 2018-12-04 DIAGNOSIS — Z8719 Personal history of other diseases of the digestive system: Secondary | ICD-10-CM | POA: Diagnosis not present

## 2018-12-04 DIAGNOSIS — Z7982 Long term (current) use of aspirin: Secondary | ICD-10-CM | POA: Diagnosis not present

## 2018-12-04 DIAGNOSIS — K219 Gastro-esophageal reflux disease without esophagitis: Secondary | ICD-10-CM | POA: Insufficient documentation

## 2018-12-04 DIAGNOSIS — G8929 Other chronic pain: Secondary | ICD-10-CM | POA: Insufficient documentation

## 2018-12-04 DIAGNOSIS — Z79899 Other long term (current) drug therapy: Secondary | ICD-10-CM | POA: Diagnosis not present

## 2018-12-04 DIAGNOSIS — F329 Major depressive disorder, single episode, unspecified: Secondary | ICD-10-CM | POA: Diagnosis not present

## 2018-12-04 DIAGNOSIS — R0602 Shortness of breath: Secondary | ICD-10-CM | POA: Insufficient documentation

## 2018-12-04 DIAGNOSIS — R944 Abnormal results of kidney function studies: Secondary | ICD-10-CM | POA: Diagnosis not present

## 2018-12-04 DIAGNOSIS — Z86718 Personal history of other venous thrombosis and embolism: Secondary | ICD-10-CM | POA: Diagnosis not present

## 2018-12-04 LAB — CBC WITH DIFFERENTIAL/PLATELET
ABS IMMATURE GRANULOCYTES: 0.02 10*3/uL (ref 0.00–0.07)
Basophils Absolute: 0 10*3/uL (ref 0.0–0.1)
Basophils Relative: 0 %
EOS PCT: 2 %
Eosinophils Absolute: 0.2 10*3/uL (ref 0.0–0.5)
HEMATOCRIT: 39.9 % (ref 36.0–46.0)
HEMOGLOBIN: 12.7 g/dL (ref 12.0–15.0)
Immature Granulocytes: 0 %
LYMPHS ABS: 2.3 10*3/uL (ref 0.7–4.0)
LYMPHS PCT: 29 %
MCH: 27.4 pg (ref 26.0–34.0)
MCHC: 31.8 g/dL (ref 30.0–36.0)
MCV: 86.2 fL (ref 80.0–100.0)
MONO ABS: 0.5 10*3/uL (ref 0.1–1.0)
MONOS PCT: 6 %
Neutro Abs: 4.9 10*3/uL (ref 1.7–7.7)
Neutrophils Relative %: 63 %
Platelets: 176 10*3/uL (ref 150–400)
RBC: 4.63 MIL/uL (ref 3.87–5.11)
RDW: 14 % (ref 11.5–15.5)
WBC: 8 10*3/uL (ref 4.0–10.5)
nRBC: 0 % (ref 0.0–0.2)

## 2018-12-04 LAB — COMPREHENSIVE METABOLIC PANEL
ALT: 19 U/L (ref 0–44)
AST: 21 U/L (ref 15–41)
Albumin: 4 g/dL (ref 3.5–5.0)
Alkaline Phosphatase: 59 U/L (ref 38–126)
Anion gap: 10 (ref 5–15)
BUN: 28 mg/dL — ABNORMAL HIGH (ref 8–23)
CHLORIDE: 103 mmol/L (ref 98–111)
CO2: 28 mmol/L (ref 22–32)
CREATININE: 1.1 mg/dL — AB (ref 0.44–1.00)
Calcium: 8.9 mg/dL (ref 8.9–10.3)
GFR calc Af Amer: 52 mL/min — ABNORMAL LOW (ref >60)
GFR, EST NON AFRICAN AMERICAN: 45 mL/min — AB (ref >60)
Glucose, Bld: 106 mg/dL — ABNORMAL HIGH (ref 70–99)
Potassium: 3.8 mmol/L (ref 3.5–5.1)
Sodium: 141 mmol/L (ref 135–145)
Total Bilirubin: 0.9 mg/dL (ref 0.3–1.2)
Total Protein: 6.7 g/dL (ref 6.5–8.1)

## 2018-12-04 NOTE — Progress Notes (Signed)
Patient here for initial visit. Daughter, Ginger Carne, present with patient.

## 2018-12-05 NOTE — Progress Notes (Signed)
Hematology/Oncology Consult note [facility] [phone] [phone]   Patient Care Team: Neita Goodnight as PCP - General (Internal Medicine) Ubaldo Glassing Javier Docker, MD as Consulting Physician (Cardiology) Vladimir Crofts, MD (Neurology)  REFERRING PROVIDER: Tomasa Rand, PA-C CHIEF COMPLAINTS/REASON FOR VISIT:  Evaluation of history of plasmacytosis.   HISTORY OF PRESENTING ILLNESS:  Amy [name] is a  84 y.o.  female with PMH listed below who was referred to me for evaluation of history of plasmacytosis. Patient wants to establish care locally.  Previously follows up with Dr.Gasparetto at [facility] center, last seeon on 07/02/2017.  Patient has a history of neck plasmacytoma, diagnosed in 2005. Reviewed her oncology records and oncologist note.  #Presented to [facility] ER 01/24/04 with confirmation of C2 expansile lytic lesion.  # SPEP normal with immunofixation with a very faint IgA kappa 01/25/04. UPEP with immunofixation demonstrating a monoclonal lambda light chain component.  #. Re-presented to [facility] ER on 02/13/04 and was admitted with severe pain in the cervical spine.  #. Bone survey performed 02/13/04 demonstrating lytic lesion involving C2 but no other definitive evidence of lytic lesions though questionable skull lucencies were present.  #. MRI of C spine 02/14/04 demonstrated an expansile lesion in C2.  # SPEP - normal with very faint IgA kappa component. UPE - monoclonal lambda light chain component.  # CT guided C2 biopsy performed 02/16/04 demonstrating plasma cells.  # BM BX 02/13/04 - 5% polyclonal plasma cell population of 40% cellular bone marrow. FISH panel performed on this material demonstrates no evidence of cytogenetic abnormalities by [facility] panel. [place] red stain for amyloid is negative #. C1-C4 posterior decompression and fusion performed 02/21/04.  # Underwent 50 Gy of radiotherapy completed  07/16/04 to cervical spine by Dr. Clemon Chambers.     Today she was accompanied by her daughter. Reports feeling at baseline, in her usual health state. Chronic neck pain, unchanged. No new bone pain.  Appetite is fair. Weight is stable.    Review of Systems  Constitutional: Negative for appetite change, chills, fatigue and fever.  HENT:   Negative for hearing loss and voice change.   Eyes: Negative for eye problems.  Respiratory: Negative for chest tightness and cough.   Cardiovascular: Negative for chest pain.  Gastrointestinal: Negative for abdominal distention, abdominal pain and blood in stool.  Endocrine: Negative for hot flashes.  Genitourinary: Negative for difficulty urinating and frequency.   Musculoskeletal: Negative for arthralgias.  Skin: Negative for itching and rash.  Neurological: Negative for extremity weakness.  Hematological: Negative for adenopathy.  Psychiatric/Behavioral: Negative for confusion.    MEDICAL HISTORY:  Past Medical History:  Diagnosis Date  . Arthritis   . Depression   . Diverticulitis   . DVT (deep venous thrombosis) (HCC)    Right leg   . Dyspnea   . Family history of adverse reaction to anesthesia    PONV sister  . GERD (gastroesophageal reflux disease)   . History of hiatal hernia   . Hyperlipidemia   . Hypertension   . Mitral valve regurgitation   . Neuromuscular disorder ([place])   . 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[place])   . Pneumonia   . Renal disorder     SURGICAL HISTORY: Past Surgical History:  Procedure Laterality Date  . ABDOMINAL HYSTERECTOMY     partial  . ABDOMINAL SURGERY    . bladder tack  1990  . BREAST BIOPSY  1974  . CATARACT EXTRACTION, BILATERAL    .  CERVICAL SPINE SURGERY     tumor removed  . HERNIA REPAIR    . KYPHOPLASTY N/A 03/12/2018   Procedure: [id number];  Surgeon: Hessie Knows, MD;  Location: [facility] ORS;  Service: Orthopedics;  Laterality: N/A;  . KYPHOPLASTY N/A 06/30/2018   Procedure: Marcos Eke;  Surgeon:  Hessie Knows, MD;  Location: [facility] ORS;  Service: Orthopedics;  Laterality: N/A;  . plasmacytoma resection  2005   lumbar    SOCIAL HISTORY: Social History   Socioeconomic History  . Marital status: Married    Spouse name: Not on file  . Number of children: Not on file  . Years of education: Not on file  . Highest education level: Not on file  Occupational History  . Occupation: Retired  Scientific laboratory technician  . Financial resource strain: Not on file  . Food insecurity:    Worry: Not on file    Inability: Not on file  . Transportation needs:    Medical: Not on file    Non-medical: Not on file  Tobacco Use  . Smoking status: Never Smoker  . Smokeless tobacco: Never Used  Substance and Sexual Activity  . Alcohol use: No    Alcohol/week: 0.0 standard drinks  . Drug use: No  . Sexual activity: Never    Birth control/protection: Post-menopausal  Lifestyle  . Physical activity:    Days per week: Not on file    Minutes per session: Not on file  . Stress: Not on file  Relationships  . Social connections:    Talks on phone: Not on file    Gets together: Not on file    Attends religious service: Not on file    Active member of club or organization: Not on file    Attends meetings of clubs or organizations: Not on file    Relationship status: Not on file  . Intimate partner violence:    Fear of current or ex partner: Not on file    Emotionally abused: Not on file    Physically abused: Not on file    Forced sexual activity: Not on file  Other Topics Concern  . Not on file  Social History Narrative  . Not on file    FAMILY HISTORY: Family History  Problem Relation Age of Onset  . Heart failure Mother   . Heart failure Father   . COPD Sister   . Prostate cancer Brother   . COPD Brother     ALLERGIES:  is allergic to doxycycline hyclate; levofloxacin; and sulfa antibiotics.  MEDICATIONS:  Current Outpatient Medications  Medication Sig Dispense Refill  . acetaminophen  (TYLENOL) 500 MG tablet Take 500 mg by mouth every 4 (four) hours as needed.    Marland Kitchen alendronate (FOSAMAX) 70 MG tablet Take 70 mg by mouth once a week. Sunday    . aspirin EC 81 MG tablet Take 1 tablet by mouth daily.    . cholecalciferol (VITAMIN D) 400 units TABS tablet Take 400 Units by mouth daily.    . cyclobenzaprine (FLEXERIL) 5 MG tablet Take 5-10 mg by mouth 3 (three) times daily as needed for muscle spasms.    . diphenhydrAMINE (BENADRYL) 25 MG tablet Take 25 mg by mouth at bedtime.    . DULoxetine (CYMBALTA) 60 MG capsule Take 60 mg by mouth daily.    Marland Kitchen ezetimibe-simvastatin (VYTORIN) 10-20 MG tablet     . HYDROcodone-acetaminophen (NORCO) 5-325 MG tablet Take 1 tablet by mouth every 6 (six) hours as needed for moderate pain. 15  tablet 0  . losartan (COZAAR) 50 MG tablet Take 25 mg by mouth daily.     . Multiple Vitamin (MULTIVITAMIN WITH MINERALS) TABS tablet Take 1 tablet by mouth daily.    . Omega-3 Fatty Acids (FISH OIL) 1000 MG CAPS Take 2 capsules by mouth daily.     Marland Kitchen omeprazole (PRILOSEC) 40 MG capsule Take 40 mg by mouth daily.    . pantoprazole (PROTONIX) 40 MG tablet Take 40 mg by mouth daily.    . Polyvinyl Alcohol-Povidone (REFRESH OP) Apply 1-2 drops to eye as needed (dry eyes).     . triamterene-hydrochlorothiazide (DYAZIDE) 37.5-25 MG capsule Take 1 capsule by mouth daily.    . NON FORMULARY Take 1 capsule by mouth daily. CBD oil     No current facility-administered medications for this visit.      PHYSICAL EXAMINATION: ECOG PERFORMANCE STATUS: 1 - Symptomatic but completely ambulatory Vitals:   12/04/18 0906  BP: 113/72  Pulse: 87  Resp: 18  Temp: 98.4 F (36.9 C)   Filed Weights   12/04/18 0906  Weight: 138 lb (62.6 kg)    Physical Exam Constitutional:      General: She is not in acute distress.    Comments: thin  HENT:     Head: Normocephalic and atraumatic.  Eyes:     General: No scleral icterus.    Pupils: Pupils are equal, round, and  reactive to light.  Neck:     Musculoskeletal: Normal range of motion and neck supple.     Comments: Scar from previous decompression surgery. No palpable mass or tenderness.  Cardiovascular:     Rate and Rhythm: Normal rate and regular rhythm.     Heart sounds: Murmur present.  Pulmonary:     Effort: Pulmonary effort is normal. No respiratory distress.     Breath sounds: No wheezing.  Abdominal:     General: Bowel sounds are normal. There is no distension.     Palpations: Abdomen is soft. There is no mass.     Tenderness: There is no abdominal tenderness.  Musculoskeletal: Normal range of motion.        General: No deformity.  Skin:    General: Skin is warm and dry.     Findings: No erythema or rash.  Neurological:     Mental Status: She is alert and oriented to person, place, and time.     Cranial Nerves: No cranial nerve deficit.     Coordination: Coordination normal.  Psychiatric:        Behavior: Behavior normal.        Thought Content: Thought content normal.      LABORATORY DATA:  I have reviewed the data as listed Lab Results  Component Value Date   WBC 8.0 12/04/2018   HGB 12.7 12/04/2018   HCT 39.9 12/04/2018   MCV 86.2 12/04/2018   PLT 176 12/04/2018   Recent Labs    03/11/18 1534 06/23/18 1014 12/04/18 0958  NA 140 142 141  K 3.8 3.9 3.8  CL 102 105 103  CO2 '27 27 28  ' GLUCOSE 97 105* 106*  BUN 28* 23 28*  CREATININE 0.74 0.72 1.10*  CALCIUM 9.4 8.9 8.9  GFRNONAA >60 >60 45*  GFRAA >60 >60 52*  PROT  --   --  6.7  ALBUMIN  --   --  4.0  AST  --   --  21  ALT  --   --  19  ALKPHOS  --   --  59  BILITOT  --   --  0.9   Iron/TIBC/Ferritin/ %Sat No results found for: IRON, TIBC, FERRITIN, IRONPCTSAT   RADIOGRAPHIC STUDIES: I have personally reviewed the radiological images as listed and agreed with the findings in the report. 12/17/2016 CT angio chest PE 1. No evidence of acute pulmonary embolism. 2. Moderate size bilateral pleural effusions  with associated bibasilar atelectasis. These findings in conjunction with reflux of contrast into the IVC and hepatic veins suggest possible heart failure. No definite edema. 3. Atherosclerosis, including the coronary arteries. 4. Moderate size hiatal hernia. 5. Nonspecific lucent lesion in the right fifth rib laterally. This may be incidental, although apparently the patient has a history of plasmacytoma; as such, multiple myeloma not completely excluded. 02/26/2018 MR pelvis wo contrast 1. No hip fracture, dislocation or avascular necrosis. 2. Mild osteoarthritis of bilateral hips 02/26/2018  MRI spine wo contrast 1. T12 compression fracture described on dedicated exam. 2. No acute finding in the lumbar spine. 3. Lower lumbar facet arthropathy with L4-5 and L5-S1 grade 1 anterolisthesis. 4. Mild for age generalized disc degeneration. 5. No compressive stenosis.  06/15/2018 Thoracic MRI wo contrast Interval development of acute/subacute T11 compression fracture, with loss of 50% height and 2-3 mm of retropulsion without cord impingement.  ASSESSMENT & PLAN:  1. History of plasmacytoma   2. Elevated serum creatinine    Patient's recent images were independently reviewed by me. Reviewed her medical records at [facility].  Discussed with patient that I will obtain baseline work up including cbc, cmp, multiple myeloma panel and light chain ratio.  Assuming all labs are stable, she can follow up with repeat labs in 1 year.   Labs reviewed, elevated Creatinine.possible over diuresis.   Will call patient and ask her to improve hydration and have a repeat kidney function next week.   Orders Placed This Encounter  Procedures  . CBC with Differential/Platelet    Standing Status:   Future    Number of Occurrences:   1    Standing Expiration Date:   12/05/2019  . Comprehensive metabolic panel    Standing Status:   Future    Number of Occurrences:   1    Standing Expiration Date:   12/05/2019  .  Multiple Myeloma Panel (SPEP&IFE w/QIG)    Standing Status:   Future    Number of Occurrences:   1    Standing Expiration Date:   12/04/2019  . Kappa/lambda light chains    Standing Status:   Future    Number of Occurrences:   1    Standing Expiration Date:   12/04/2019    All questions were answered. The patient knows to call the clinic with any problems questions or concerns.  Return of visit: 1 year Thank you for this kind referral and the opportunity to participate in the care of this patient. A copy of today's note is routed to referring provider  Total face to face encounter time for this patient visit was 45 min. >50% of the time was  spent in counseling and coordination of care.    Earlie Server, MD, PhD Hematology Oncology [facility] Pager- [phone]

## 2018-12-07 ENCOUNTER — Other Ambulatory Visit: Payer: Self-pay

## 2018-12-07 DIAGNOSIS — R7989 Other specified abnormal findings of blood chemistry: Secondary | ICD-10-CM

## 2018-12-07 LAB — KAPPA/LAMBDA LIGHT CHAINS
KAPPA FREE LGHT CHN: 13.4 mg/L (ref 3.3–19.4)
KAPPA, LAMDA LIGHT CHAIN RATIO: 0.09 — AB (ref 0.26–1.65)
LAMDA FREE LIGHT CHAINS: 155.6 mg/L — AB (ref 5.7–26.3)

## 2018-12-09 ENCOUNTER — Encounter (INDEPENDENT_AMBULATORY_CARE_PROVIDER_SITE_OTHER): Payer: Medicare Other | Admitting: Ophthalmology

## 2018-12-09 DIAGNOSIS — H353231 Exudative age-related macular degeneration, bilateral, with active choroidal neovascularization: Secondary | ICD-10-CM

## 2018-12-09 DIAGNOSIS — H35033 Hypertensive retinopathy, bilateral: Secondary | ICD-10-CM | POA: Diagnosis not present

## 2018-12-09 DIAGNOSIS — H43813 Vitreous degeneration, bilateral: Secondary | ICD-10-CM | POA: Diagnosis not present

## 2018-12-09 DIAGNOSIS — I10 Essential (primary) hypertension: Secondary | ICD-10-CM

## 2018-12-09 LAB — MULTIPLE MYELOMA PANEL, SERUM
ALBUMIN SERPL ELPH-MCNC: 3.7 g/dL (ref 2.9–4.4)
Albumin/Glob SerPl: 1.5 (ref 0.7–1.7)
Alpha 1: 0.2 g/dL (ref 0.0–0.4)
Alpha2 Glob SerPl Elph-Mcnc: 0.9 g/dL (ref 0.4–1.0)
B-Globulin SerPl Elph-Mcnc: 1 g/dL (ref 0.7–1.3)
GAMMA GLOB SERPL ELPH-MCNC: 0.4 g/dL (ref 0.4–1.8)
GLOBULIN, TOTAL: 2.6 g/dL (ref 2.2–3.9)
IGA: 212 mg/dL (ref 64–422)
IgG (Immunoglobin G), Serum: 558 mg/dL — ABNORMAL LOW (ref 700–1600)
IgM (Immunoglobulin M), Srm: 20 mg/dL — ABNORMAL LOW (ref 26–217)
TOTAL PROTEIN ELP: 6.3 g/dL (ref 6.0–8.5)

## 2018-12-14 ENCOUNTER — Inpatient Hospital Stay: Payer: Medicare Other

## 2018-12-14 ENCOUNTER — Other Ambulatory Visit: Payer: Self-pay

## 2018-12-14 DIAGNOSIS — G8929 Other chronic pain: Secondary | ICD-10-CM | POA: Diagnosis not present

## 2018-12-14 DIAGNOSIS — M129 Arthropathy, unspecified: Secondary | ICD-10-CM | POA: Diagnosis not present

## 2018-12-14 DIAGNOSIS — M542 Cervicalgia: Secondary | ICD-10-CM | POA: Diagnosis not present

## 2018-12-14 DIAGNOSIS — F329 Major depressive disorder, single episode, unspecified: Secondary | ICD-10-CM | POA: Diagnosis not present

## 2018-12-14 DIAGNOSIS — Z8579 Personal history of other malignant neoplasms of lymphoid, hematopoietic and related tissues: Secondary | ICD-10-CM | POA: Diagnosis not present

## 2018-12-14 DIAGNOSIS — R7989 Other specified abnormal findings of blood chemistry: Secondary | ICD-10-CM

## 2018-12-14 DIAGNOSIS — R944 Abnormal results of kidney function studies: Secondary | ICD-10-CM | POA: Diagnosis not present

## 2018-12-14 LAB — BASIC METABOLIC PANEL
Anion gap: 7 (ref 5–15)
BUN: 19 mg/dL (ref 8–23)
CHLORIDE: 110 mmol/L (ref 98–111)
CO2: 28 mmol/L (ref 22–32)
CREATININE: 0.66 mg/dL (ref 0.44–1.00)
Calcium: 8.8 mg/dL — ABNORMAL LOW (ref 8.9–10.3)
GFR calc Af Amer: >60 mL/min (ref >60)
GFR calc non Af Amer: >60 mL/min (ref >60)
Glucose, Bld: 111 mg/dL — ABNORMAL HIGH (ref 70–99)
POTASSIUM: 3.8 mmol/L (ref 3.5–5.1)
Sodium: 145 mmol/L (ref 135–145)

## 2019-01-13 ENCOUNTER — Encounter (INDEPENDENT_AMBULATORY_CARE_PROVIDER_SITE_OTHER): Payer: Medicare Other | Admitting: Ophthalmology

## 2019-01-18 ENCOUNTER — Encounter (INDEPENDENT_AMBULATORY_CARE_PROVIDER_SITE_OTHER): Payer: Medicare Other | Admitting: Ophthalmology

## 2019-01-18 DIAGNOSIS — H43813 Vitreous degeneration, bilateral: Secondary | ICD-10-CM

## 2019-01-18 DIAGNOSIS — H35033 Hypertensive retinopathy, bilateral: Secondary | ICD-10-CM | POA: Diagnosis not present

## 2019-01-18 DIAGNOSIS — H353213 Exudative age-related macular degeneration, right eye, with inactive scar: Secondary | ICD-10-CM

## 2019-01-18 DIAGNOSIS — I10 Essential (primary) hypertension: Secondary | ICD-10-CM | POA: Diagnosis not present

## 2019-03-01 ENCOUNTER — Encounter (INDEPENDENT_AMBULATORY_CARE_PROVIDER_SITE_OTHER): Payer: Medicare Other | Admitting: Ophthalmology

## 2019-03-01 DIAGNOSIS — H353231 Exudative age-related macular degeneration, bilateral, with active choroidal neovascularization: Secondary | ICD-10-CM

## 2019-03-01 DIAGNOSIS — I10 Essential (primary) hypertension: Secondary | ICD-10-CM

## 2019-03-01 DIAGNOSIS — H43813 Vitreous degeneration, bilateral: Secondary | ICD-10-CM

## 2019-03-01 DIAGNOSIS — H35033 Hypertensive retinopathy, bilateral: Secondary | ICD-10-CM | POA: Diagnosis not present

## 2019-04-12 ENCOUNTER — Encounter (INDEPENDENT_AMBULATORY_CARE_PROVIDER_SITE_OTHER): Payer: Medicare Other | Admitting: Ophthalmology

## 2019-05-10 ENCOUNTER — Encounter (INDEPENDENT_AMBULATORY_CARE_PROVIDER_SITE_OTHER): Payer: Medicare Other | Admitting: Ophthalmology

## 2019-06-21 ENCOUNTER — Encounter (INDEPENDENT_AMBULATORY_CARE_PROVIDER_SITE_OTHER): Payer: Medicare Other | Admitting: Ophthalmology

## 2019-06-21 ENCOUNTER — Other Ambulatory Visit: Payer: Self-pay

## 2019-06-21 DIAGNOSIS — I10 Essential (primary) hypertension: Secondary | ICD-10-CM

## 2019-06-21 DIAGNOSIS — H35033 Hypertensive retinopathy, bilateral: Secondary | ICD-10-CM

## 2019-06-21 DIAGNOSIS — H43813 Vitreous degeneration, bilateral: Secondary | ICD-10-CM

## 2019-06-21 DIAGNOSIS — H353231 Exudative age-related macular degeneration, bilateral, with active choroidal neovascularization: Secondary | ICD-10-CM

## 2019-08-02 ENCOUNTER — Other Ambulatory Visit: Payer: Self-pay

## 2019-08-02 ENCOUNTER — Encounter (INDEPENDENT_AMBULATORY_CARE_PROVIDER_SITE_OTHER): Payer: Medicare Other | Admitting: Ophthalmology

## 2019-08-02 DIAGNOSIS — H35033 Hypertensive retinopathy, bilateral: Secondary | ICD-10-CM

## 2019-08-02 DIAGNOSIS — H43813 Vitreous degeneration, bilateral: Secondary | ICD-10-CM | POA: Diagnosis not present

## 2019-08-02 DIAGNOSIS — H353231 Exudative age-related macular degeneration, bilateral, with active choroidal neovascularization: Secondary | ICD-10-CM

## 2019-08-02 DIAGNOSIS — I10 Essential (primary) hypertension: Secondary | ICD-10-CM | POA: Diagnosis not present

## 2019-09-13 ENCOUNTER — Other Ambulatory Visit: Payer: Self-pay

## 2019-09-13 ENCOUNTER — Encounter (INDEPENDENT_AMBULATORY_CARE_PROVIDER_SITE_OTHER): Payer: Medicare Other | Admitting: Ophthalmology

## 2019-09-13 DIAGNOSIS — H35033 Hypertensive retinopathy, bilateral: Secondary | ICD-10-CM | POA: Diagnosis not present

## 2019-09-13 DIAGNOSIS — I10 Essential (primary) hypertension: Secondary | ICD-10-CM | POA: Diagnosis not present

## 2019-09-13 DIAGNOSIS — H43813 Vitreous degeneration, bilateral: Secondary | ICD-10-CM | POA: Diagnosis not present

## 2019-09-13 DIAGNOSIS — H353231 Exudative age-related macular degeneration, bilateral, with active choroidal neovascularization: Secondary | ICD-10-CM

## 2019-10-25 ENCOUNTER — Other Ambulatory Visit: Payer: Self-pay

## 2019-10-25 ENCOUNTER — Encounter (INDEPENDENT_AMBULATORY_CARE_PROVIDER_SITE_OTHER): Payer: Medicare Other | Admitting: Ophthalmology

## 2019-10-25 DIAGNOSIS — H353231 Exudative age-related macular degeneration, bilateral, with active choroidal neovascularization: Secondary | ICD-10-CM | POA: Diagnosis not present

## 2019-10-25 DIAGNOSIS — I10 Essential (primary) hypertension: Secondary | ICD-10-CM

## 2019-10-25 DIAGNOSIS — H43813 Vitreous degeneration, bilateral: Secondary | ICD-10-CM

## 2019-10-25 DIAGNOSIS — H35033 Hypertensive retinopathy, bilateral: Secondary | ICD-10-CM | POA: Diagnosis not present

## 2019-11-25 IMAGING — CR DG LUMBAR SPINE COMPLETE 4+V
5 series · 5 of 5 positions shown · non-contrast
Comparison: CT chest with bony reformats December 17, 2016

CLINICAL DATA: Lumbago following recent fall

EXAM:
LUMBAR SPINE - COMPLETE 4+ VIEW

[l-spine ap]
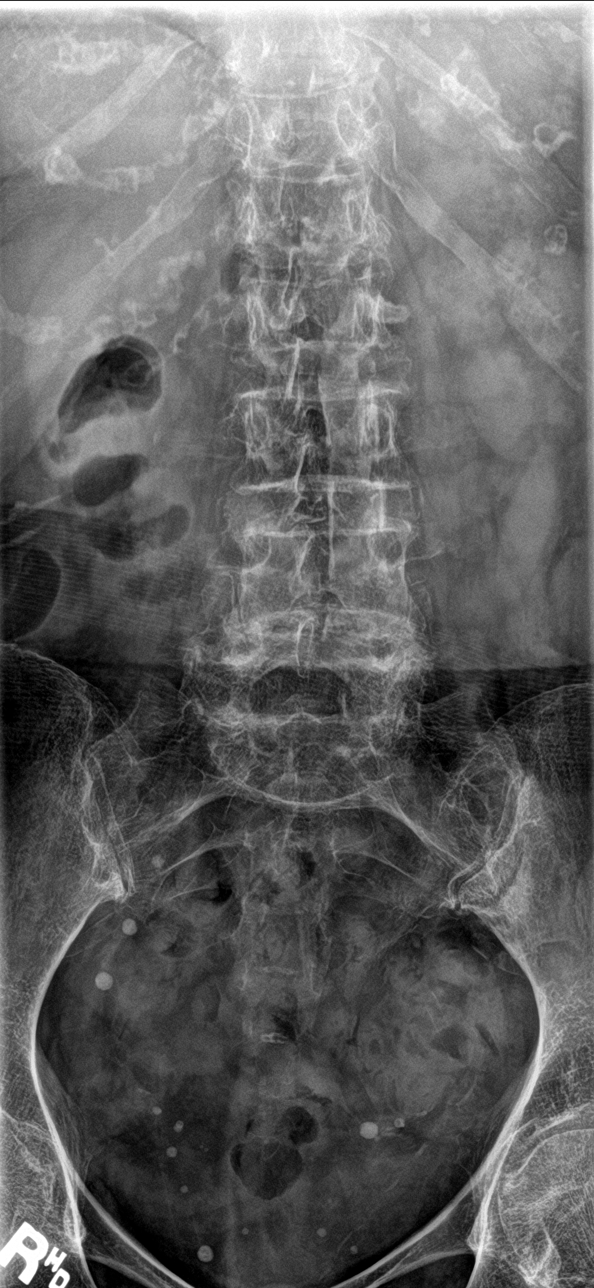

[l-spine obl (1 of 2)]
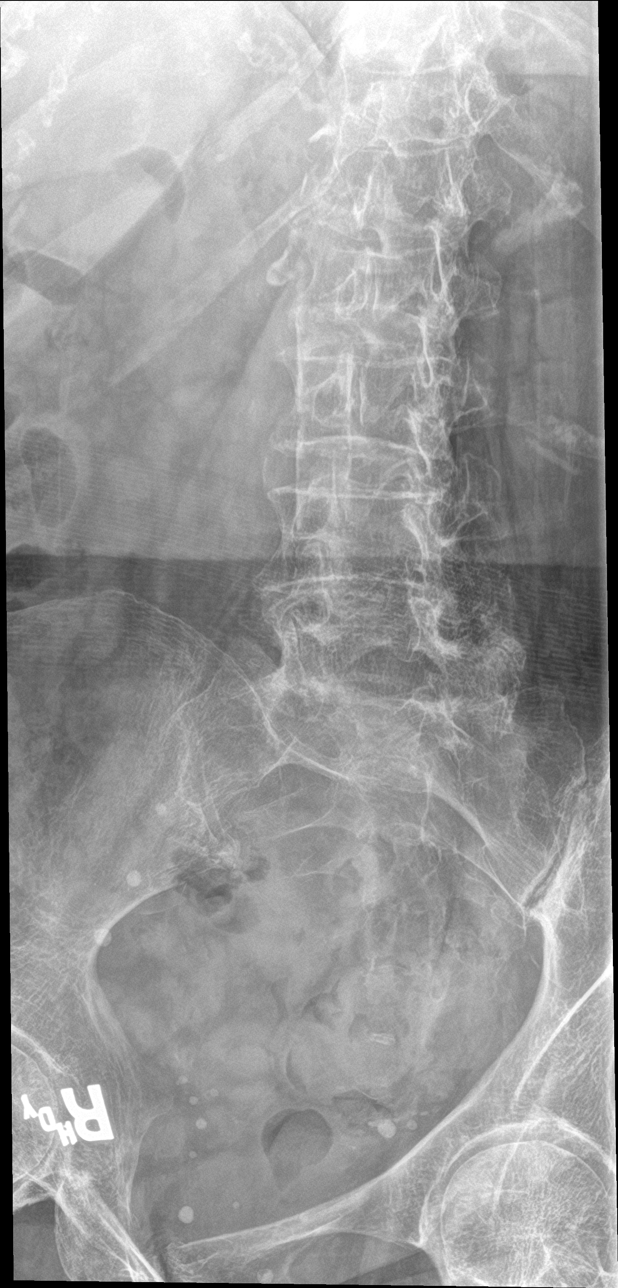

[l-spine obl (2 of 2)]
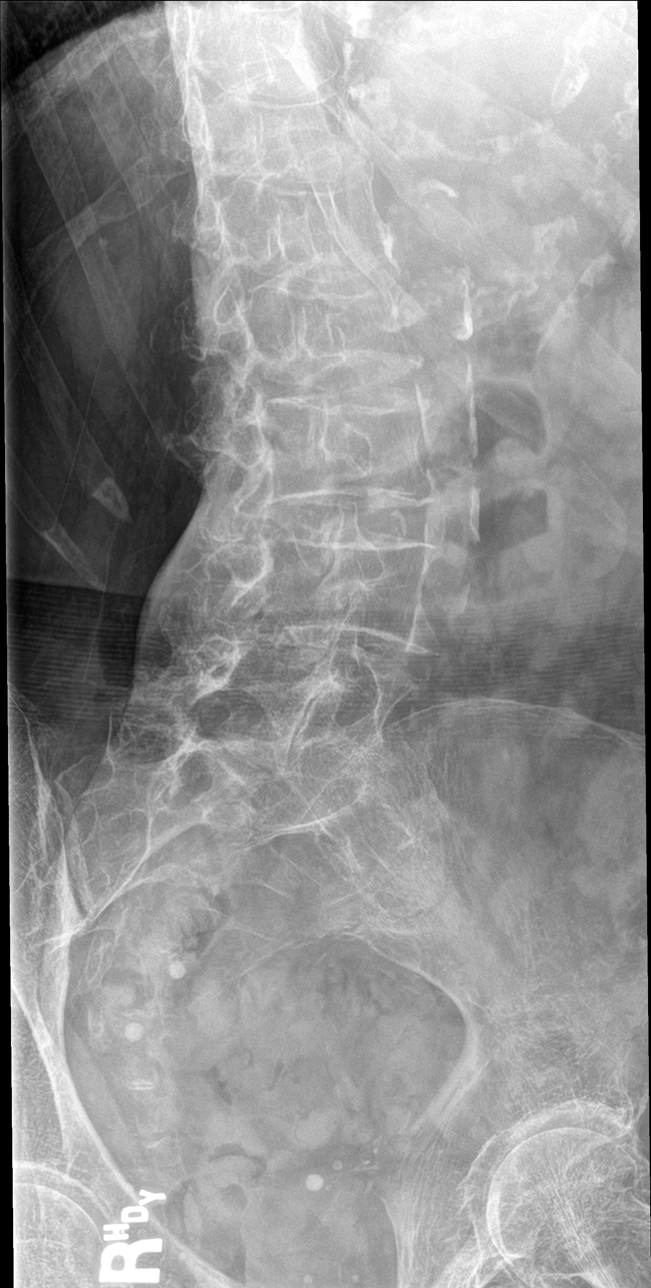

[l-spine lat]
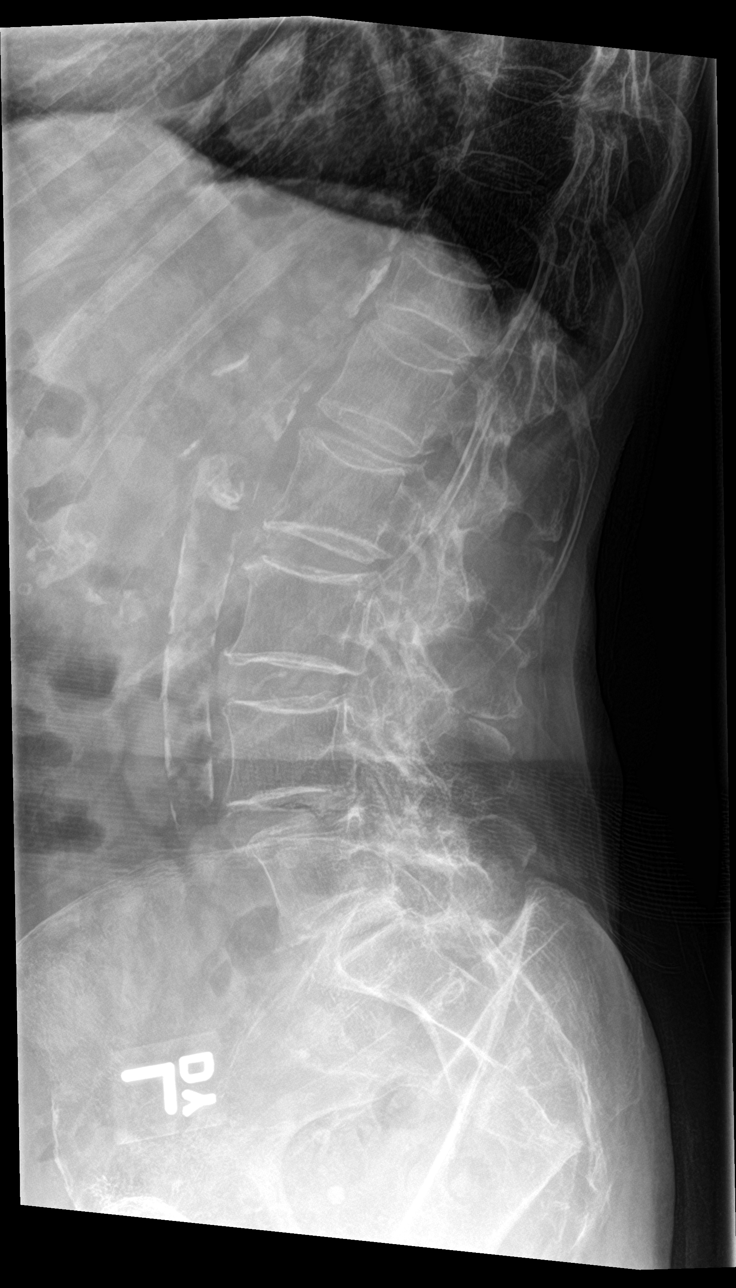

[l-spine spot]
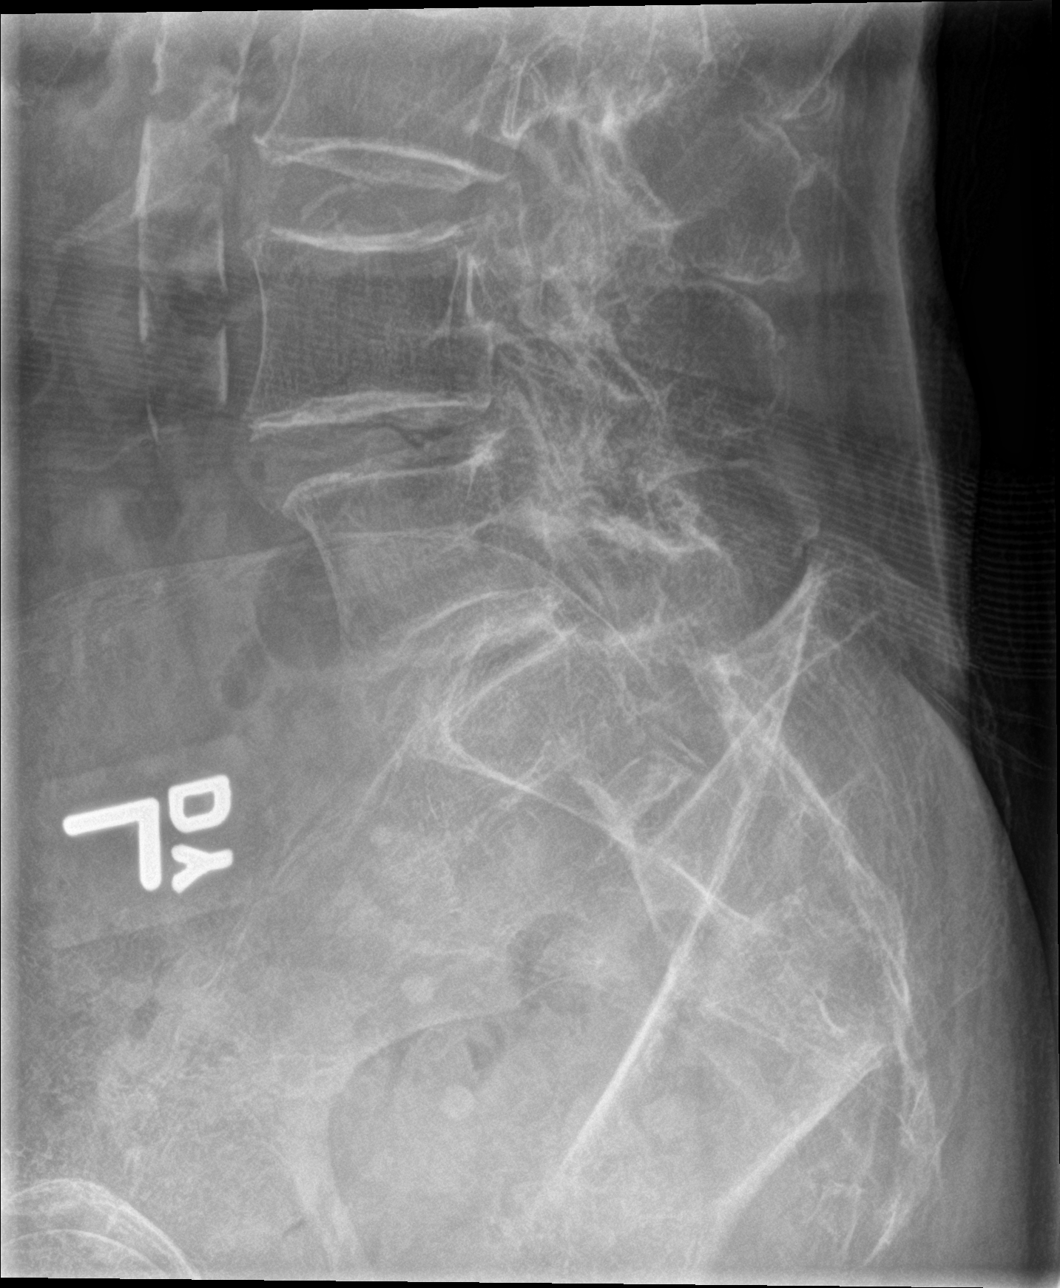

[5 of 5 positions shown; findings below may reference images not displayed]

FINDINGS: Frontal, lateral, spot lumbosacral lateral, and bilateral oblique
oblique views were obtained. The there are 5 non-rib-bearing lumbar
type vertebral bodies. There is age uncertain anterior wedging of
the T11 vertebral body, not present on prior study from 9678. No
lumbar region fracture evident. No spondylolisthesis. There is mild
disc space narrowing at L4-5 and L5-S1. Other disc spaces appear
unremarkable. There is facet osteoarthritic change at L3-4, L4-5,
and L5-S1 bilaterally.

Bones are osteoporotic.  There is aortoiliac atherosclerosis.
IMPRESSION: 1. Anterior wedging of the T12 vertebral body, age uncertain but not
present on prior study from November 2016. No other fracture
evident. No spondylolisthesis.

2.   Osteoarthritic change at several levels.

3.  Bones osteoporotic.

4.  Aortoiliac atherosclerosis.

Aortic Atherosclerosis ([id number]).

## 2019-11-25 IMAGING — CR DG PELVIS 1-2V
1 series · 1 of 1 positions shown · non-contrast
Comparison: None.

CLINICAL DATA: Pain following fall

EXAM:
PELVIS - 1-2 VIEW

[pelvis ap]
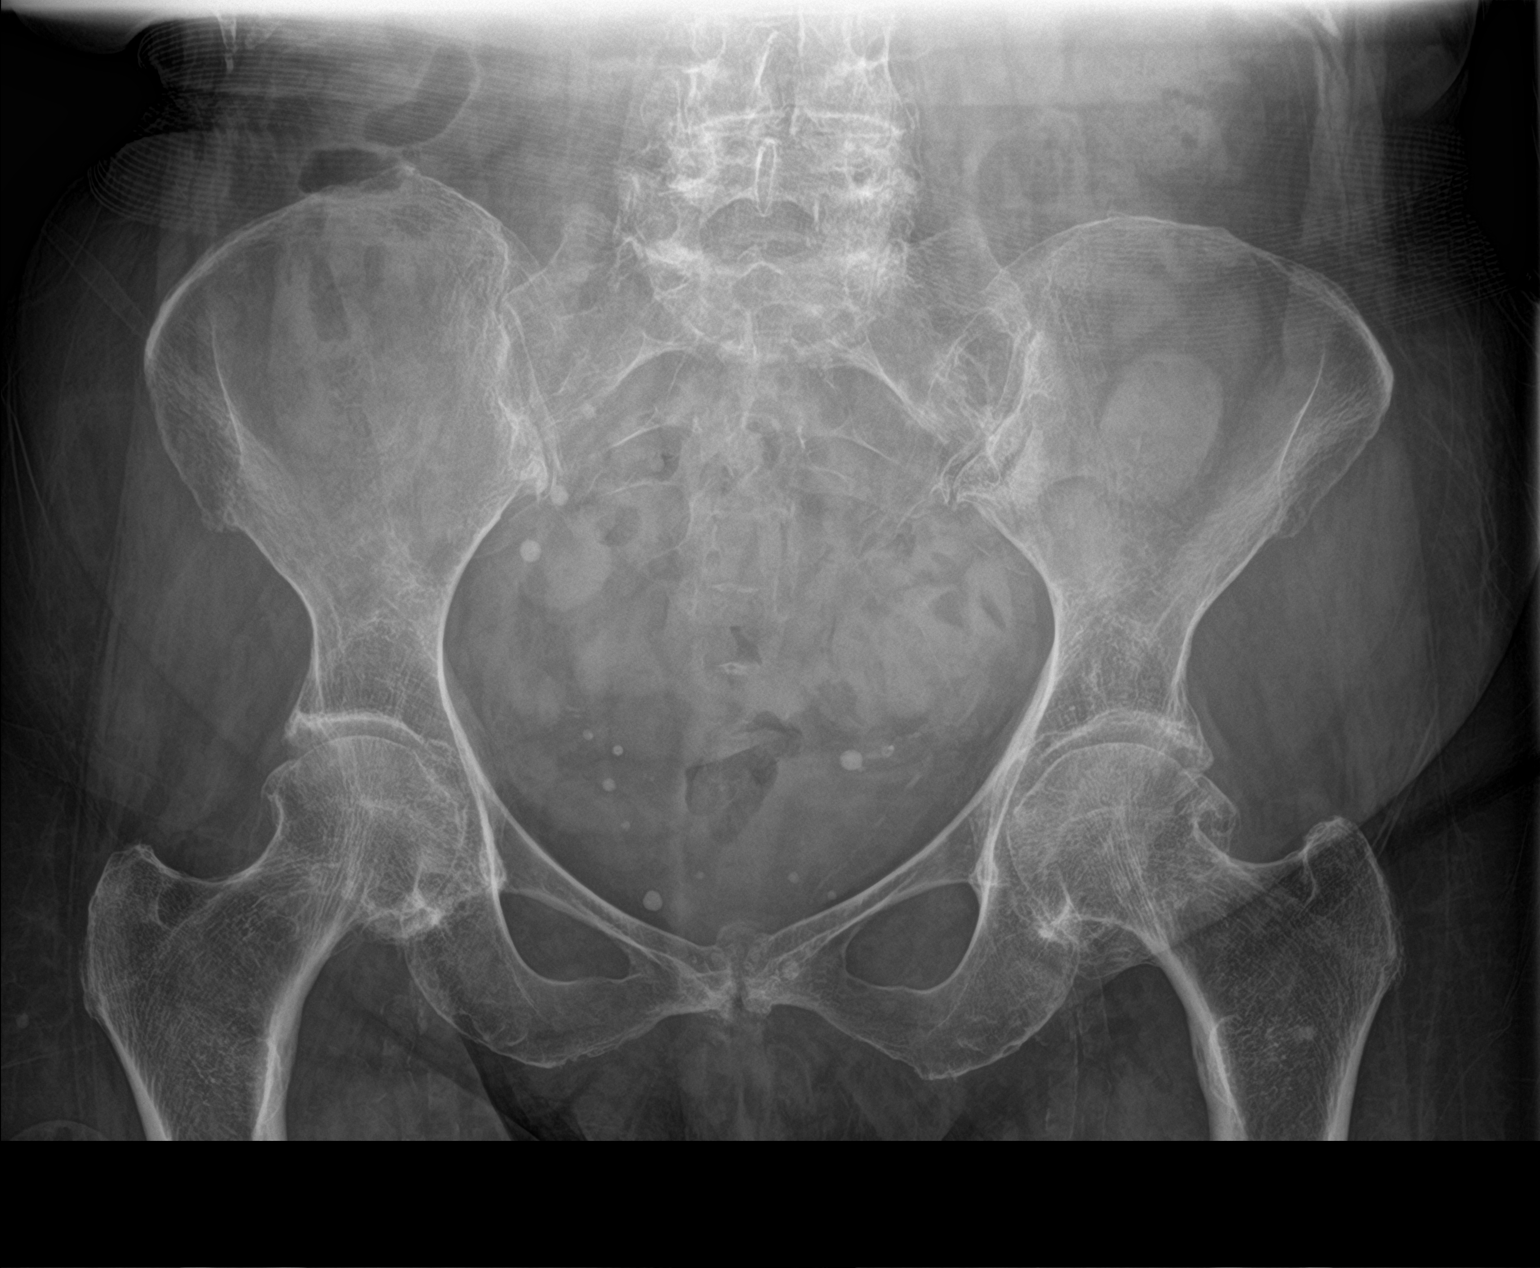

[1 of 1 positions shown; findings below may reference images not displayed]

FINDINGS: There is no evidence of pelvic fracture or dislocation. There is
osteoarthritic change in the pubic symphysis as well as moderate
symmetric narrowing of both hip joints. Bones are osteoporotic.
There is common femoral, superficial femoral, and profunda femoral
artery calcification bilaterally.
IMPRESSION: Symmetric narrowing both hip joints. Osteoarthritic change in PICC
symphysis. No fracture or dislocation. Bones osteoporotic. There are
foci of atherosclerotic calcification bilaterally.

## 2019-12-06 ENCOUNTER — Other Ambulatory Visit: Payer: Self-pay

## 2019-12-06 ENCOUNTER — Inpatient Hospital Stay: Payer: Medicare Other | Attending: Oncology

## 2019-12-06 ENCOUNTER — Encounter (INDEPENDENT_AMBULATORY_CARE_PROVIDER_SITE_OTHER): Payer: Medicare Other | Admitting: Ophthalmology

## 2019-12-06 DIAGNOSIS — R7989 Other specified abnormal findings of blood chemistry: Secondary | ICD-10-CM

## 2019-12-06 DIAGNOSIS — C9030 Solitary plasmacytoma not having achieved remission: Secondary | ICD-10-CM | POA: Insufficient documentation

## 2019-12-06 DIAGNOSIS — I10 Essential (primary) hypertension: Secondary | ICD-10-CM | POA: Diagnosis not present

## 2019-12-06 DIAGNOSIS — K219 Gastro-esophageal reflux disease without esophagitis: Secondary | ICD-10-CM | POA: Diagnosis not present

## 2019-12-06 DIAGNOSIS — F329 Major depressive disorder, single episode, unspecified: Secondary | ICD-10-CM | POA: Insufficient documentation

## 2019-12-06 DIAGNOSIS — M129 Arthropathy, unspecified: Secondary | ICD-10-CM | POA: Diagnosis not present

## 2019-12-06 DIAGNOSIS — E785 Hyperlipidemia, unspecified: Secondary | ICD-10-CM | POA: Diagnosis not present

## 2019-12-06 DIAGNOSIS — Z79899 Other long term (current) drug therapy: Secondary | ICD-10-CM | POA: Insufficient documentation

## 2019-12-06 DIAGNOSIS — Z8579 Personal history of other malignant neoplasms of lymphoid, hematopoietic and related tissues: Secondary | ICD-10-CM

## 2019-12-06 DIAGNOSIS — Z86718 Personal history of other venous thrombosis and embolism: Secondary | ICD-10-CM | POA: Insufficient documentation

## 2019-12-06 LAB — COMPREHENSIVE METABOLIC PANEL
ALT: 37 U/L (ref 0–44)
AST: 40 U/L (ref 15–41)
Albumin: 4.1 g/dL (ref 3.5–5.0)
Alkaline Phosphatase: 63 U/L (ref 38–126)
Anion gap: 12 (ref 5–15)
BUN: 28 mg/dL — ABNORMAL HIGH (ref 8–23)
CO2: 27 mmol/L (ref 22–32)
Calcium: 9.5 mg/dL (ref 8.9–10.3)
Chloride: 104 mmol/L (ref 98–111)
Creatinine, Ser: 0.96 mg/dL (ref 0.44–1.00)
GFR calc Af Amer: >60 mL/min (ref >60)
GFR calc non Af Amer: 53 mL/min — ABNORMAL LOW (ref >60)
Glucose, Bld: 124 mg/dL — ABNORMAL HIGH (ref 70–99)
Potassium: 4.2 mmol/L (ref 3.5–5.1)
Sodium: 143 mmol/L (ref 135–145)
Total Bilirubin: 0.7 mg/dL (ref 0.3–1.2)
Total Protein: 6.8 g/dL (ref 6.5–8.1)

## 2019-12-06 LAB — CBC WITH DIFFERENTIAL/PLATELET
Abs Immature Granulocytes: 0.02 10*3/uL (ref 0.00–0.07)
Basophils Absolute: 0 10*3/uL (ref 0.0–0.1)
Basophils Relative: 0 %
Eosinophils Absolute: 0.1 10*3/uL (ref 0.0–0.5)
Eosinophils Relative: 2 %
HCT: 43.7 % (ref 36.0–46.0)
Hemoglobin: 13.7 g/dL (ref 12.0–15.0)
Immature Granulocytes: 0 %
Lymphocytes Relative: 25 %
Lymphs Abs: 1.7 10*3/uL (ref 0.7–4.0)
MCH: 27.8 pg (ref 26.0–34.0)
MCHC: 31.4 g/dL (ref 30.0–36.0)
MCV: 88.8 fL (ref 80.0–100.0)
Monocytes Absolute: 0.4 10*3/uL (ref 0.1–1.0)
Monocytes Relative: 7 %
Neutro Abs: 4.4 10*3/uL (ref 1.7–7.7)
Neutrophils Relative %: 66 %
Platelets: 176 10*3/uL (ref 150–400)
RBC: 4.92 MIL/uL (ref 3.87–5.11)
RDW: 13.6 % (ref 11.5–15.5)
WBC: 6.7 10*3/uL (ref 4.0–10.5)
nRBC: 0 % (ref 0.0–0.2)

## 2019-12-07 LAB — KAPPA/LAMBDA LIGHT CHAINS
Kappa free light chain: 15.8 mg/L (ref 3.3–19.4)
Kappa, lambda light chain ratio: 0.07 — ABNORMAL LOW (ref 0.26–1.65)
Lambda free light chains: 231.7 mg/L — ABNORMAL HIGH (ref 5.7–26.3)

## 2019-12-08 ENCOUNTER — Inpatient Hospital Stay: Payer: Medicare Other | Admitting: Oncology

## 2019-12-08 ENCOUNTER — Encounter: Payer: Self-pay | Admitting: Oncology

## 2019-12-08 NOTE — Progress Notes (Signed)
Patient does not offer any problems today.   Not all labs have been resulted.  Rescheduling this visit out another week.

## 2019-12-09 ENCOUNTER — Telehealth: Payer: Medicare Other | Admitting: Oncology

## 2019-12-09 LAB — MULTIPLE MYELOMA PANEL, SERUM
Albumin SerPl Elph-Mcnc: 4 g/dL (ref 2.9–4.4)
Albumin/Glob SerPl: 1.7 (ref 0.7–1.7)
Alpha 1: 0.2 g/dL (ref 0.0–0.4)
Alpha2 Glob SerPl Elph-Mcnc: 0.8 g/dL (ref 0.4–1.0)
B-Globulin SerPl Elph-Mcnc: 1 g/dL (ref 0.7–1.3)
Gamma Glob SerPl Elph-Mcnc: 0.4 g/dL (ref 0.4–1.8)
Globulin, Total: 2.4 g/dL (ref 2.2–3.9)
IgA: 266 mg/dL (ref 64–422)
IgG (Immunoglobin G), Serum: 544 mg/dL — ABNORMAL LOW (ref 586–1602)
IgM (Immunoglobulin M), Srm: 21 mg/dL — ABNORMAL LOW (ref 26–217)
Total Protein ELP: 6.4 g/dL (ref 6.0–8.5)

## 2019-12-13 ENCOUNTER — Inpatient Hospital Stay: Payer: Medicare Other | Admitting: Oncology

## 2019-12-14 ENCOUNTER — Inpatient Hospital Stay (HOSPITAL_BASED_OUTPATIENT_CLINIC_OR_DEPARTMENT_OTHER): Payer: Medicare Other | Admitting: Oncology

## 2019-12-14 ENCOUNTER — Encounter: Payer: Self-pay | Admitting: Oncology

## 2019-12-14 DIAGNOSIS — Z8579 Personal history of other malignant neoplasms of lymphoid, hematopoietic and related tissues: Secondary | ICD-10-CM

## 2019-12-14 NOTE — Progress Notes (Signed)
Patient contacted for telehealth visit. No concerns voiced.  

## 2019-12-14 NOTE — Progress Notes (Signed)
HEMATOLOGY-ONCOLOGY TeleHEALTH VISIT PROGRESS NOTE  I connected with Amy [name] on 12/14/19 at  1:45 PM EST by video enabled telemedicine visit and verified that I am speaking with the correct person using two identifiers. I discussed the limitations, risks, security and privacy concerns of performing an evaluation and management service by telemedicine and the availability of in-person appointments. I also discussed with the patient that there may be a patient responsible charge related to this service. The patient expressed understanding and agreed to proceed.   Other persons participating in the visit and their role in the encounter:  Daughter who helps setting up virtual visit.  Patient's location: Home  Provider's location: office Chief Complaint: History of plasmacytoma follow-up   INTERVAL HISTORY Amy [name] is a 85 y.o. female who has above history reviewed by me today presents for follow up visit for follow-up of plasmacytoma Problems and complaints are listed below:  I attempted to connect the patient for visual enabled telehealth visit.  Due to the technical difficulties with video,  Patient was transitioned to audio only visit. Patient reports feeling well today.  She has no new complaints.  Review of Systems  Unable to perform ROS: Age  Constitutional: Negative for appetite change, fatigue and unexpected weight change.  Respiratory: Negative for shortness of breath.   Cardiovascular: Negative for chest pain.  Musculoskeletal: Negative for back pain.  Neurological: Negative for dizziness.  Psychiatric/Behavioral: Negative for confusion.    Past Medical History:  Diagnosis Date  . Arthritis   . Depression   . Diverticulitis   . DVT (deep venous thrombosis) (HCC)    Right leg   . Dyspnea   . Family history of adverse reaction to anesthesia    PONV sister  . GERD (gastroesophageal reflux disease)   . History of hiatal hernia   . Hyperlipidemia   .  Hypertension   . Mitral valve regurgitation   . Neuromuscular disorder ([place])   . 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 Pneumonia   . Renal disorder    Past Surgical History:  Procedure Laterality Date  . ABDOMINAL HYSTERECTOMY     partial  . ABDOMINAL SURGERY    . bladder tack  1990  . BREAST BIOPSY  1974  . CATARACT EXTRACTION, BILATERAL    . CERVICAL SPINE SURGERY     tumor removed  . HERNIA REPAIR    . KYPHOPLASTY N/A 03/12/2018   Procedure: [id number];  Surgeon: Hessie Knows, MD;  Location: [facility] ORS;  Service: Orthopedics;  Laterality: N/A;  . KYPHOPLASTY N/A 06/30/2018   Procedure: Marcos Eke;  Surgeon: Hessie Knows, MD;  Location: [facility] ORS;  Service: Orthopedics;  Laterality: N/A;  . plasmacytoma resection  2005   lumbar    Family History  Problem Relation Age of Onset  . Heart failure Mother   . Heart failure Father   . COPD Sister   . Prostate cancer Brother   . COPD Brother     Social History   Socioeconomic History  . Marital status: Married    Spouse name: Not on [name]  . Number of children: Not on [name]  . Years of education: Not on [name]  . Highest education level: Not on [name]  Occupational History  . Occupation: Retired  Tobacco Use  . Smoking status: Never Smoker  . Smokeless tobacco: Never Used  Substance and Sexual Activity  . Alcohol use: No    Alcohol/week: 0.0 standard drinks  . Drug use: No  . Sexual activity: Never  Birth control/protection: Post-menopausal  Other Topics Concern  . Not on [name]  Social History Narrative  . Not on [name]   Social Determinants of Health   Financial Resource Strain:   . Difficulty of Paying Living Expenses: Not on [name]  Food Insecurity:   . Worried About Charity fundraiser in the Last Year: Not on [name]  . Ran Out of Food in the Last Year: Not on [name]  Transportation Needs:   . Lack of Transportation (Medical): Not on [name]  . Lack of Transportation (Non-Medical): Not on [name]  Physical Activity:   . Days of  Exercise per Week: Not on [name]  . Minutes of Exercise per Session: Not on [name]  Stress:   . Feeling of Stress : Not on [name]  Social Connections:   . Frequency of Communication with Friends and Family: Not on [name]  . Frequency of Social Gatherings with Friends and Family: Not on [name]  . Attends Religious Services: Not on [name]  . Active Member of Clubs or Organizations: Not on [name]  . Attends Archivist Meetings: Not on [name]  . Marital Status: Not on [name]  Intimate Partner Violence:   . Fear of Current or Ex-Partner: Not on [name]  . Emotionally Abused: Not on [name]  . Physically Abused: Not on [name]  . Sexually Abused: Not on [name]    Current Outpatient Medications on [name] Prior to Visit  Medication Sig Dispense Refill  . acetaminophen (TYLENOL) 500 MG tablet Take 500 mg by mouth every 4 (four) hours as needed.    Marland Kitchen alendronate (FOSAMAX) 70 MG tablet Take 70 mg by mouth once a week. Sunday    . aspirin EC 81 MG tablet Take 1 tablet by mouth daily.    . cholecalciferol (VITAMIN D) 400 units TABS tablet Take 400 Units by mouth daily.    . cyclobenzaprine (FLEXERIL) 5 MG tablet Take 5-10 mg by mouth 3 (three) times daily as needed for muscle spasms.    . DULoxetine (CYMBALTA) 60 MG capsule Take 60 mg by mouth daily.    Marland Kitchen ezetimibe-simvastatin (VYTORIN) 10-20 MG tablet     . famotidine (PEPCID) 20 MG tablet Take 20 mg by mouth 2 (two) times daily.    Marland Kitchen losartan (COZAAR) 50 MG tablet Take 25 mg by mouth daily.     . Multiple Vitamin (MULTIVITAMIN WITH MINERALS) TABS tablet Take 1 tablet by mouth daily.    . NON FORMULARY Take 1 capsule by mouth daily. CBD oil    . Omega-3 Fatty Acids (FISH OIL) 1000 MG CAPS Take 2 capsules by mouth daily.     Marland Kitchen omeprazole (PRILOSEC) 40 MG capsule Take 40 mg by mouth daily.    . pantoprazole (PROTONIX) 40 MG tablet Take 40 mg by mouth daily.    . Polyvinyl Alcohol-Povidone (REFRESH OP) Apply 1-2 drops to eye as needed (dry eyes).     .  triamterene-hydrochlorothiazide (DYAZIDE) 37.5-25 MG capsule Take 1 capsule by mouth daily.    . diphenhydrAMINE (BENADRYL) 25 MG tablet Take 25 mg by mouth at bedtime.    Marland Kitchen HYDROcodone-acetaminophen (NORCO) 5-325 MG tablet Take 1 tablet by mouth every 6 (six) hours as needed for moderate pain. (Patient not taking: Reported on 12/14/2019) 15 tablet 0   No current facility-administered medications on [name] prior to visit.    Allergies  Allergen Reactions  . Doxycycline Hyclate Nausea Only  . Levofloxacin Nausea And Vomiting  . Sulfa Antibiotics Rash  Other Reaction: Throat feels funny       Observations/Objective: Today's Vitals   12/14/19 1319  PainSc: 0-No pain   There is no height or weight on [name] to calculate BMI.  Physical Exam  Constitutional: No distress.  Neurological: She is alert.    CBC    Component Value Date/Time   WBC 6.7 12/06/2019 1027   RBC 4.92 12/06/2019 1027   HGB 13.7 12/06/2019 1027   HGB 13.8 05/13/2016 1054   HCT 43.7 12/06/2019 1027   HCT 43.3 05/13/2016 1054   PLT 176 12/06/2019 1027   PLT 213 05/13/2016 1054   MCV 88.8 12/06/2019 1027   MCV 86 05/13/2016 1054   MCH 27.8 12/06/2019 1027   MCHC 31.4 12/06/2019 1027   RDW 13.6 12/06/2019 1027   RDW 14.6 05/13/2016 1054   LYMPHSABS 1.7 12/06/2019 1027   LYMPHSABS 1.9 05/13/2016 1054   MONOABS 0.4 12/06/2019 1027   EOSABS 0.1 12/06/2019 1027   EOSABS 0.2 05/13/2016 1054   BASOSABS 0.0 12/06/2019 1027   BASOSABS 0.0 05/13/2016 1054    CMP     Component Value Date/Time   NA 143 12/06/2019 1027   NA 144 05/13/2016 1054   K 4.2 12/06/2019 1027   CL 104 12/06/2019 1027   CO2 27 12/06/2019 1027   GLUCOSE 124 (H) 12/06/2019 1027   BUN 28 (H) 12/06/2019 1027   BUN 22 05/13/2016 1054   CREATININE 0.96 12/06/2019 1027   CALCIUM 9.5 12/06/2019 1027   PROT 6.8 12/06/2019 1027   PROT 7.0 05/13/2016 1054   ALBUMIN 4.1 12/06/2019 1027   ALBUMIN 4.6 05/13/2016 1054   AST 40 12/06/2019 1027    ALT 37 12/06/2019 1027   ALKPHOS 63 12/06/2019 1027   BILITOT 0.7 12/06/2019 1027   BILITOT 0.6 05/13/2016 1054   GFRNONAA 53 (L) 12/06/2019 1027   GFRAA >60 12/06/2019 1027     Assessment and Plan: 1. History of plasmacytoma     History of plasmacytoma, multiple myeloma panel was reviewed and discussed with patient.  Patient has no monoclonal protein. Light chain ratio chronically decreased since 2017.  Involved lamda light chain level increased to 231.  Patient is asymptomatic.  I recommend continue to monitor.  Follow Up Instructions: 1 year   I discussed the assessment and treatment plan with the patient. The patient was provided an opportunity to ask questions and all were answered. The patient agreed with the plan and demonstrated an understanding of the instructions.  The patient was advised to call back or seek an in-person evaluation if the symptoms worsen or if the condition fails to improve as anticipated.    Earlie Server, MD 12/14/2019 7:39 PM

## 2019-12-27 ENCOUNTER — Encounter (INDEPENDENT_AMBULATORY_CARE_PROVIDER_SITE_OTHER): Payer: Medicare Other | Admitting: Ophthalmology

## 2019-12-27 DIAGNOSIS — H43813 Vitreous degeneration, bilateral: Secondary | ICD-10-CM | POA: Diagnosis not present

## 2019-12-27 DIAGNOSIS — H353231 Exudative age-related macular degeneration, bilateral, with active choroidal neovascularization: Secondary | ICD-10-CM

## 2019-12-27 DIAGNOSIS — H35033 Hypertensive retinopathy, bilateral: Secondary | ICD-10-CM | POA: Diagnosis not present

## 2019-12-27 DIAGNOSIS — I10 Essential (primary) hypertension: Secondary | ICD-10-CM | POA: Diagnosis not present

## 2020-02-07 ENCOUNTER — Other Ambulatory Visit: Payer: Self-pay

## 2020-02-07 ENCOUNTER — Encounter (INDEPENDENT_AMBULATORY_CARE_PROVIDER_SITE_OTHER): Payer: Medicare Other | Admitting: Ophthalmology

## 2020-02-07 DIAGNOSIS — I10 Essential (primary) hypertension: Secondary | ICD-10-CM

## 2020-02-07 DIAGNOSIS — H43813 Vitreous degeneration, bilateral: Secondary | ICD-10-CM

## 2020-02-07 DIAGNOSIS — H353231 Exudative age-related macular degeneration, bilateral, with active choroidal neovascularization: Secondary | ICD-10-CM

## 2020-02-07 DIAGNOSIS — H35033 Hypertensive retinopathy, bilateral: Secondary | ICD-10-CM | POA: Diagnosis not present

## 2020-03-13 ENCOUNTER — Encounter (INDEPENDENT_AMBULATORY_CARE_PROVIDER_SITE_OTHER): Payer: Medicare Other | Admitting: Ophthalmology

## 2020-03-13 DIAGNOSIS — H353231 Exudative age-related macular degeneration, bilateral, with active choroidal neovascularization: Secondary | ICD-10-CM | POA: Diagnosis not present

## 2020-03-13 DIAGNOSIS — H35033 Hypertensive retinopathy, bilateral: Secondary | ICD-10-CM

## 2020-03-13 DIAGNOSIS — H43813 Vitreous degeneration, bilateral: Secondary | ICD-10-CM | POA: Diagnosis not present

## 2020-03-13 DIAGNOSIS — I10 Essential (primary) hypertension: Secondary | ICD-10-CM

## 2020-04-24 ENCOUNTER — Encounter (INDEPENDENT_AMBULATORY_CARE_PROVIDER_SITE_OTHER): Payer: Medicare Other | Admitting: Ophthalmology

## 2020-04-26 ENCOUNTER — Encounter (INDEPENDENT_AMBULATORY_CARE_PROVIDER_SITE_OTHER): Payer: Medicare Other | Admitting: Ophthalmology

## 2020-04-26 ENCOUNTER — Other Ambulatory Visit: Payer: Self-pay

## 2020-04-26 DIAGNOSIS — H43813 Vitreous degeneration, bilateral: Secondary | ICD-10-CM

## 2020-04-26 DIAGNOSIS — H35033 Hypertensive retinopathy, bilateral: Secondary | ICD-10-CM | POA: Diagnosis not present

## 2020-04-26 DIAGNOSIS — I10 Essential (primary) hypertension: Secondary | ICD-10-CM | POA: Diagnosis not present

## 2020-04-26 DIAGNOSIS — H353231 Exudative age-related macular degeneration, bilateral, with active choroidal neovascularization: Secondary | ICD-10-CM | POA: Diagnosis not present

## 2020-06-05 ENCOUNTER — Encounter (INDEPENDENT_AMBULATORY_CARE_PROVIDER_SITE_OTHER): Payer: Medicare Other | Admitting: Ophthalmology

## 2020-06-21 ENCOUNTER — Encounter (INDEPENDENT_AMBULATORY_CARE_PROVIDER_SITE_OTHER): Payer: Medicare Other | Admitting: Ophthalmology

## 2020-06-21 ENCOUNTER — Other Ambulatory Visit: Payer: Self-pay

## 2020-06-21 DIAGNOSIS — I10 Essential (primary) hypertension: Secondary | ICD-10-CM | POA: Diagnosis not present

## 2020-06-21 DIAGNOSIS — H353231 Exudative age-related macular degeneration, bilateral, with active choroidal neovascularization: Secondary | ICD-10-CM

## 2020-06-21 DIAGNOSIS — H43813 Vitreous degeneration, bilateral: Secondary | ICD-10-CM

## 2020-06-21 DIAGNOSIS — H35033 Hypertensive retinopathy, bilateral: Secondary | ICD-10-CM

## 2020-07-25 ENCOUNTER — Telehealth: Payer: Self-pay

## 2020-07-25 DIAGNOSIS — M542 Cervicalgia: Secondary | ICD-10-CM

## 2020-07-25 DIAGNOSIS — Z8579 Personal history of other malignant neoplasms of lymphoid, hematopoietic and related tissues: Secondary | ICD-10-CM

## 2020-07-25 NOTE — Telephone Encounter (Signed)
Please arrange patient to have blood work done-same labs which she needs to do in December. Please arrange patient to have MRI cervical spine with and without contrast for further evaluation. I will see her after MRI.  Thank you

## 2020-07-25 NOTE — Telephone Encounter (Signed)
Patient daughter Amy [name] called with concerns about Patient's neck pain for the past 2 months.  The pain has gotten so bad she is wearing a soft neck collar.  They have discussed this pain with the PCP who recommended they contact Dr. Tasia Catchings as this may be associated with her cancer.  Should patient be seen sooner than the already scheduled December appt?

## 2020-07-26 NOTE — Telephone Encounter (Signed)
Done... Labs/MRI  See MD 2 days later appts has been scheduled as requested. Called and spoke with pts daughter [name] and made her aware of her mother's scheduled appts. Location, dates and time of the lab/MRI on 8/11 and to RTC to see MD 2 days later.

## 2020-07-26 NOTE — Telephone Encounter (Signed)
Please schedule patient for labs and MRI/MD 2 days later.  Inform patient/daughter of appts.

## 2020-08-02 ENCOUNTER — Ambulatory Visit
Admission: RE | Admit: 2020-08-02 | Discharge: 2020-08-02 | Disposition: A | Discharge status: Home / Self Care | Payer: Medicare Other | Source: Ambulatory Visit | Attending: Oncology | Admitting: Oncology

## 2020-08-02 ENCOUNTER — Encounter (INDEPENDENT_AMBULATORY_CARE_PROVIDER_SITE_OTHER): Payer: Medicare Other | Admitting: Ophthalmology

## 2020-08-02 ENCOUNTER — Inpatient Hospital Stay: Payer: Medicare Other | Attending: Oncology

## 2020-08-02 ENCOUNTER — Other Ambulatory Visit: Payer: Self-pay

## 2020-08-02 DIAGNOSIS — E785 Hyperlipidemia, unspecified: Secondary | ICD-10-CM | POA: Diagnosis not present

## 2020-08-02 DIAGNOSIS — I340 Nonrheumatic mitral (valve) insufficiency: Secondary | ICD-10-CM | POA: Diagnosis not present

## 2020-08-02 DIAGNOSIS — H35033 Hypertensive retinopathy, bilateral: Secondary | ICD-10-CM | POA: Diagnosis not present

## 2020-08-02 DIAGNOSIS — I2510 Atherosclerotic heart disease of native coronary artery without angina pectoris: Secondary | ICD-10-CM | POA: Diagnosis not present

## 2020-08-02 DIAGNOSIS — D72822 Plasmacytosis: Secondary | ICD-10-CM | POA: Insufficient documentation

## 2020-08-02 DIAGNOSIS — Z79899 Other long term (current) drug therapy: Secondary | ICD-10-CM | POA: Insufficient documentation

## 2020-08-02 DIAGNOSIS — H43813 Vitreous degeneration, bilateral: Secondary | ICD-10-CM | POA: Diagnosis not present

## 2020-08-02 DIAGNOSIS — R0602 Shortness of breath: Secondary | ICD-10-CM | POA: Insufficient documentation

## 2020-08-02 DIAGNOSIS — Z8579 Personal history of other malignant neoplasms of lymphoid, hematopoietic and related tissues: Secondary | ICD-10-CM | POA: Insufficient documentation

## 2020-08-02 DIAGNOSIS — I10 Essential (primary) hypertension: Secondary | ICD-10-CM

## 2020-08-02 DIAGNOSIS — M542 Cervicalgia: Secondary | ICD-10-CM | POA: Diagnosis present

## 2020-08-02 DIAGNOSIS — H353231 Exudative age-related macular degeneration, bilateral, with active choroidal neovascularization: Secondary | ICD-10-CM

## 2020-08-02 DIAGNOSIS — R930 Abnormal findings on diagnostic imaging of skull and head, not elsewhere classified: Secondary | ICD-10-CM | POA: Diagnosis not present

## 2020-08-02 DIAGNOSIS — Z86718 Personal history of other venous thrombosis and embolism: Secondary | ICD-10-CM | POA: Insufficient documentation

## 2020-08-02 DIAGNOSIS — F329 Major depressive disorder, single episode, unspecified: Secondary | ICD-10-CM | POA: Insufficient documentation

## 2020-08-02 DIAGNOSIS — K219 Gastro-esophageal reflux disease without esophagitis: Secondary | ICD-10-CM | POA: Diagnosis not present

## 2020-08-02 DIAGNOSIS — M129 Arthropathy, unspecified: Secondary | ICD-10-CM | POA: Diagnosis not present

## 2020-08-02 DIAGNOSIS — Z7982 Long term (current) use of aspirin: Secondary | ICD-10-CM | POA: Insufficient documentation

## 2020-08-02 LAB — COMPREHENSIVE METABOLIC PANEL
ALT: 24 U/L (ref 0–44)
AST: 27 U/L (ref 15–41)
Albumin: 4 g/dL (ref 3.5–5.0)
Alkaline Phosphatase: 50 U/L (ref 38–126)
Anion gap: 11 (ref 5–15)
BUN: 30 mg/dL — ABNORMAL HIGH (ref 8–23)
CO2: 29 mmol/L (ref 22–32)
Calcium: 9.9 mg/dL (ref 8.9–10.3)
Chloride: 101 mmol/L (ref 98–111)
Creatinine, Ser: 0.79 mg/dL (ref 0.44–1.00)
GFR calc Af Amer: >60 mL/min (ref >60)
GFR calc non Af Amer: >60 mL/min (ref >60)
Glucose, Bld: 114 mg/dL — ABNORMAL HIGH (ref 70–99)
Potassium: 4.6 mmol/L (ref 3.5–5.1)
Sodium: 141 mmol/L (ref 135–145)
Total Bilirubin: 1 mg/dL (ref 0.3–1.2)
Total Protein: 6.8 g/dL (ref 6.5–8.1)

## 2020-08-02 LAB — CBC WITH DIFFERENTIAL/PLATELET
Abs Immature Granulocytes: 0.04 10*3/uL (ref 0.00–0.07)
Basophils Absolute: 0 10*3/uL (ref 0.0–0.1)
Basophils Relative: 0 %
Eosinophils Absolute: 0 10*3/uL (ref 0.0–0.5)
Eosinophils Relative: 0 %
HCT: 40.8 % (ref 36.0–46.0)
Hemoglobin: 13.4 g/dL (ref 12.0–15.0)
Immature Granulocytes: 0 %
Lymphocytes Relative: 25 %
Lymphs Abs: 2.3 10*3/uL (ref 0.7–4.0)
MCH: 28.4 pg (ref 26.0–34.0)
MCHC: 32.8 g/dL (ref 30.0–36.0)
MCV: 86.4 fL (ref 80.0–100.0)
Monocytes Absolute: 0.5 10*3/uL (ref 0.1–1.0)
Monocytes Relative: 6 %
Neutro Abs: 6 10*3/uL (ref 1.7–7.7)
Neutrophils Relative %: 69 %
Platelets: 212 10*3/uL (ref 150–400)
RBC: 4.72 MIL/uL (ref 3.87–5.11)
RDW: 14 % (ref 11.5–15.5)
WBC: 8.9 10*3/uL (ref 4.0–10.5)
nRBC: 0 % (ref 0.0–0.2)

## 2020-08-02 MED ORDER — GADOBUTROL 1 MMOL/ML IV SOLN
6.0000 mL | Freq: Once | INTRAVENOUS | Status: AC | PRN
  Administered 2020-08-02: 6 mL via INTRAVENOUS

## 2020-08-03 ENCOUNTER — Telehealth: Payer: Self-pay

## 2020-08-03 LAB — KAPPA/LAMBDA LIGHT CHAINS
Kappa free light chain: 15.2 mg/L (ref 3.3–19.4)
Kappa, lambda light chain ratio: 0.07 — ABNORMAL LOW (ref 0.26–1.65)
Lambda free light chains: 222.2 mg/L — ABNORMAL HIGH (ref 5.7–26.3)

## 2020-08-03 NOTE — Telephone Encounter (Signed)
Please schedule patient for MD only.

## 2020-08-03 NOTE — Telephone Encounter (Signed)
-----   Message from Earlie Server, MD sent at 08/02/2020 11:20 PM EDT ----- Please arrange her to see me to discuss MRI results.

## 2020-08-03 NOTE — Telephone Encounter (Signed)
Please disregard. Pt is already scheduled to come in tomorrow.

## 2020-08-04 ENCOUNTER — Encounter: Payer: Self-pay | Admitting: Oncology

## 2020-08-04 ENCOUNTER — Ambulatory Visit
Admission: RE | Admit: 2020-08-04 | Discharge: 2020-08-04 | Disposition: A | Discharge status: Home / Self Care | Payer: Medicare Other | Attending: Oncology | Admitting: Oncology

## 2020-08-04 ENCOUNTER — Other Ambulatory Visit: Payer: Self-pay

## 2020-08-04 ENCOUNTER — Inpatient Hospital Stay (HOSPITAL_BASED_OUTPATIENT_CLINIC_OR_DEPARTMENT_OTHER): Payer: Medicare Other | Admitting: Oncology

## 2020-08-04 ENCOUNTER — Ambulatory Visit
Admission: RE | Admit: 2020-08-04 | Discharge: 2020-08-04 | Disposition: A | Discharge status: Home / Self Care | Payer: Medicare Other | Source: Ambulatory Visit | Attending: Oncology | Admitting: Oncology

## 2020-08-04 VITALS — BP 137/75 | HR 84 | Temp 96.7°F | Resp 18 | Wt 142.2 lb

## 2020-08-04 DIAGNOSIS — Z8579 Personal history of other malignant neoplasms of lymphoid, hematopoietic and related tissues: Secondary | ICD-10-CM | POA: Diagnosis present

## 2020-08-04 DIAGNOSIS — M542 Cervicalgia: Secondary | ICD-10-CM

## 2020-08-04 DIAGNOSIS — R9389 Abnormal findings on diagnostic imaging of other specified body structures: Secondary | ICD-10-CM

## 2020-08-04 DIAGNOSIS — Z7189 Other specified counseling: Secondary | ICD-10-CM

## 2020-08-04 DIAGNOSIS — D72822 Plasmacytosis: Secondary | ICD-10-CM | POA: Diagnosis not present

## 2020-08-04 LAB — MULTIPLE MYELOMA PANEL, SERUM
Albumin SerPl Elph-Mcnc: 3.5 g/dL (ref 2.9–4.4)
Albumin/Glob SerPl: 1.3 (ref 0.7–1.7)
Alpha 1: 0.2 g/dL (ref 0.0–0.4)
Alpha2 Glob SerPl Elph-Mcnc: 1 g/dL (ref 0.4–1.0)
B-Globulin SerPl Elph-Mcnc: 1.2 g/dL (ref 0.7–1.3)
Gamma Glob SerPl Elph-Mcnc: 0.5 g/dL (ref 0.4–1.8)
Globulin, Total: 2.9 g/dL (ref 2.2–3.9)
IgA: 268 mg/dL (ref 64–422)
IgG (Immunoglobin G), Serum: 502 mg/dL — ABNORMAL LOW (ref 586–1602)
IgM (Immunoglobulin M), Srm: 20 mg/dL — ABNORMAL LOW (ref 26–217)
Total Protein ELP: 6.4 g/dL (ref 6.0–8.5)

## 2020-08-04 MED ORDER — ACETAMINOPHEN-CODEINE #3 300-30 MG PO TABS: 1.0000 | ORAL_TABLET | Freq: Four times a day (QID) | ORAL | 0 refills | Status: DC | PRN

## 2020-08-04 NOTE — Progress Notes (Signed)
Pt here for follow up. No new concerns voiced.   

## 2020-08-04 NOTE — Progress Notes (Signed)
Hematology/Oncology Consult note [facility] [phone] [phone]   Patient Care Team: Neita Goodnight as PCP - General (Internal Medicine) Ubaldo Glassing Javier Docker, MD as Consulting Physician (Cardiology) Vladimir Crofts, MD (Neurology) Earlie Server, MD as Consulting Physician (Oncology)  REFERRING PROVIDER: Tomasa Rand, PA-C CHIEF COMPLAINTS/REASON FOR VISIT:  Evaluation of history of plasmacytosis.   HISTORY OF PRESENTING ILLNESS:  Amy [name] is a  86 y.o.  female with PMH listed below who was referred to me for evaluation of history of plasmacytosis. Patient wants to establish care locally.  Previously follows up with Dr.Gasparetto at [facility] center, last seeon on 07/02/2017.  Patient has a history of neck plasmacytoma, diagnosed in 2005. Reviewed her oncology records and oncologist note.  #Presented to [facility] ER 01/24/04 with confirmation of C2 expansile lytic lesion.  # SPEP normal with immunofixation with a very faint IgA kappa 01/25/04. UPEP with immunofixation demonstrating a monoclonal lambda light chain component.  #. Re-presented to [facility] ER on 02/13/04 and was admitted with severe pain in the cervical spine.  #. Bone survey performed 02/13/04 demonstrating lytic lesion involving C2 but no other definitive evidence of lytic lesions though questionable skull lucencies were present.  #. MRI of C spine 02/14/04 demonstrated an expansile lesion in C2.  # SPEP - normal with very faint IgA kappa component. UPE - monoclonal lambda light chain component.  # CT guided C2 biopsy performed 02/16/04 demonstrating plasma cells.  # BM BX 02/13/04 - 5% polyclonal plasma cell population of 40% cellular bone marrow. FISH panel performed on this material demonstrates no evidence of cytogenetic abnormalities by [facility] panel. [place] red stain for amyloid is negative #. C1-C4 posterior decompression and fusion performed  02/21/04.  # Underwent 50 Gy of radiotherapy completed 07/16/04 to cervical spine by Dr. Clemon Chambers.    INTERVAL HISTORY Amy [name] is a 86 y.o. female who has above history reviewed by me today presents for follow up visit for plasmacytoma, increased neck pain. Problems and complaints are listed below: Patient has had MRI cervical spine done. 08/02/2020, MRI cervical spine showed abnormal signal and enhancement throughout the clivus which was incompletely visualized with current study Status post C2-4 posterior fusion.  The central canal is open at each level.  No change.  No marked changes in cervical spondylosis which appears worst at C4-5 and C5-6 as described above . Patient reports having neck pain for the past 2 to 3 weeks.  She points towards her posterior skull area. Pain is intermittent, she takes Tylenol and ibuprofen for pain with some relief. Patient has had chronic limited neck motion due to previous C-spine surgery. Patient was accompanied by her daughter. She denies any back pain.  Otherwise no new complaints.  Denies any focal deficits  Review of Systems  Constitutional: Negative for appetite change, chills, fatigue and fever.  HENT:   Negative for hearing loss and voice change.   Eyes: Negative for eye problems.  Respiratory: Negative for chest tightness and cough.   Cardiovascular: Negative for chest pain.  Gastrointestinal: Negative for abdominal distention, abdominal pain and blood in stool.  Endocrine: Negative for hot flashes.  Genitourinary: Negative for difficulty urinating and frequency.   Musculoskeletal: Negative for arthralgias.       Neck pain  Skin: Negative for itching and rash.  Neurological: Negative for extremity weakness.  Hematological: Negative for adenopathy.  Psychiatric/Behavioral: Negative for confusion.    MEDICAL HISTORY:  Past  Medical History:  Diagnosis Date  . Arthritis   . Depression   . Diverticulitis   . DVT (deep venous  thrombosis) (HCC)    Right leg   . Dyspnea   . Family history of adverse reaction to anesthesia    PONV sister  . GERD (gastroesophageal reflux disease)   . History of hiatal hernia   . Hyperlipidemia   . Hypertension   . Mitral valve regurgitation   . Neuromuscular disorder ([place])   . 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[place])   . Pneumonia   . Renal disorder     SURGICAL HISTORY: Past Surgical History:  Procedure Laterality Date  . ABDOMINAL HYSTERECTOMY     partial  . ABDOMINAL SURGERY    . bladder tack  1990  . BREAST BIOPSY  1974  . CATARACT EXTRACTION, BILATERAL    . CERVICAL SPINE SURGERY     tumor removed  . HERNIA REPAIR    . KYPHOPLASTY N/A 03/12/2018   Procedure: [id number];  Surgeon: Hessie Knows, MD;  Location: [facility] ORS;  Service: Orthopedics;  Laterality: N/A;  . KYPHOPLASTY N/A 06/30/2018   Procedure: Marcos Eke;  Surgeon: Hessie Knows, MD;  Location: [facility] ORS;  Service: Orthopedics;  Laterality: N/A;  . plasmacytoma resection  2005   lumbar    SOCIAL HISTORY: Social History   Socioeconomic History  . Marital status: Married    Spouse name: Not on file  . Number of children: Not on file  . Years of education: Not on file  . Highest education level: Not on file  Occupational History  . Occupation: Retired  Tobacco Use  . Smoking status: Never Smoker  . Smokeless tobacco: Never Used  Vaping Use  . Vaping Use: Never used  Substance and Sexual Activity  . Alcohol use: No    Alcohol/week: 0.0 standard drinks  . Drug use: No  . Sexual activity: Never    Birth control/protection: Post-menopausal  Other Topics Concern  . Not on file  Social History Narrative  . Not on file   Social Determinants of Health   Financial Resource Strain:   . Difficulty of Paying Living Expenses:   Food Insecurity:   . Worried About Charity fundraiser in the Last Year:   . Arboriculturist in the Last Year:   Transportation Needs:   . Film/video editor (Medical):   Marland Kitchen Lack  of Transportation (Non-Medical):   Physical Activity:   . Days of Exercise per Week:   . Minutes of Exercise per Session:   Stress:   . Feeling of Stress :   Social Connections:   . Frequency of Communication with Friends and Family:   . Frequency of Social Gatherings with Friends and Family:   . Attends Religious Services:   . Active Member of Clubs or Organizations:   . Attends Archivist Meetings:   Marland Kitchen Marital Status:   Intimate Partner Violence:   . Fear of Current or Ex-Partner:   . Emotionally Abused:   Marland Kitchen Physically Abused:   . Sexually Abused:     FAMILY HISTORY: Family History  Problem Relation Age of Onset  . Heart failure Mother   . Heart failure Father   . COPD Sister   . Prostate cancer Brother   . COPD Brother     ALLERGIES:  is allergic to doxycycline hyclate, levofloxacin, and sulfa antibiotics.  MEDICATIONS:  Current Outpatient Medications  Medication Sig Dispense Refill  . acetaminophen (TYLENOL) 500 MG tablet Take 500  mg by mouth every 4 (four) hours as needed.    Marland Kitchen alendronate (FOSAMAX) 70 MG tablet Take 70 mg by mouth once a week. Sunday    . aspirin EC 81 MG tablet Take 1 tablet by mouth daily.    . cholecalciferol (VITAMIN D) 400 units TABS tablet Take 400 Units by mouth daily.    . cyclobenzaprine (FLEXERIL) 5 MG tablet Take 5-10 mg by mouth 3 (three) times daily as needed for muscle spasms.    . diphenhydrAMINE (BENADRYL) 25 MG tablet Take 25 mg by mouth at bedtime.    . DULoxetine (CYMBALTA) 60 MG capsule Take 60 mg by mouth daily.    Marland Kitchen esomeprazole (NEXIUM) 40 MG capsule Take by mouth.    . losartan (COZAAR) 50 MG tablet Take 25 mg by mouth daily.     . Multiple Vitamin (MULTIVITAMIN WITH MINERALS) TABS tablet Take 1 tablet by mouth daily.    . Omega-3 Fatty Acids (FISH OIL) 1000 MG CAPS Take 2 capsules by mouth daily.     . Polyvinyl Alcohol-Povidone (REFRESH OP) Apply 1-2 drops to eye as needed (dry eyes).     .  triamterene-hydrochlorothiazide (DYAZIDE) 37.5-25 MG capsule Take 1 capsule by mouth daily.    Marland Kitchen acetaminophen-codeine (TYLENOL #3) 300-30 MG tablet Take 1 tablet by mouth every 6 (six) hours as needed for moderate pain. 30 tablet 0  . HYDROcodone-acetaminophen (NORCO) 5-325 MG tablet Take 1 tablet by mouth every 6 (six) hours as needed for moderate pain. (Patient not taking: Reported on 12/14/2019) 15 tablet 0  . NON FORMULARY Take 1 capsule by mouth daily. CBD oil (Patient not taking: Reported on 08/04/2020)    . omeprazole (PRILOSEC) 40 MG capsule Take 40 mg by mouth daily.     No current facility-administered medications for this visit.     PHYSICAL EXAMINATION: ECOG PERFORMANCE STATUS: 1 - Symptomatic but completely ambulatory Vitals:   08/04/20 1053  BP: 137/75  Pulse: 84  Resp: 18  Temp: (!) 96.7 F (35.9 C)   Filed Weights   08/04/20 1053  Weight: 142 lb 3.2 oz (64.5 kg)    Physical Exam Constitutional:      General: She is not in acute distress.    Comments: thin  HENT:     Head: Normocephalic and atraumatic.  Eyes:     General: No scleral icterus.    Pupils: Pupils are equal, round, and reactive to light.  Neck:     Comments: Scar from previous decompression surgery. No palpable mass or tenderness.  Cardiovascular:     Rate and Rhythm: Normal rate and regular rhythm.     Heart sounds: Murmur heard.   Pulmonary:     Effort: Pulmonary effort is normal. No respiratory distress.     Breath sounds: No wheezing.  Abdominal:     General: Bowel sounds are normal. There is no distension.     Palpations: Abdomen is soft. There is no mass.     Tenderness: There is no abdominal tenderness.  Musculoskeletal:        General: No deformity. Normal range of motion.     Cervical back: Normal range of motion and neck supple.  Skin:    General: Skin is warm and dry.     Findings: No erythema or rash.  Neurological:     Mental Status: She is alert and oriented to person,  place, and time. Mental status is at baseline.     Cranial Nerves: No cranial nerve  deficit.     Coordination: Coordination normal.  Psychiatric:        Mood and Affect: Mood normal.      LABORATORY DATA:  I have reviewed the data as listed Lab Results  Component Value Date   WBC 8.9 08/02/2020   HGB 13.4 08/02/2020   HCT 40.8 08/02/2020   MCV 86.4 08/02/2020   PLT 212 08/02/2020   Recent Labs    12/06/19 1027 08/02/20 0954  NA 143 141  K 4.2 4.6  CL 104 101  CO2 27 29  GLUCOSE 124* 114*  BUN 28* 30*  CREATININE 0.96 0.79  CALCIUM 9.5 9.9  GFRNONAA 53* >60  GFRAA >60 >60  PROT 6.8 6.8  ALBUMIN 4.1 4.0  AST 40 27  ALT 37 24  ALKPHOS 63 50  BILITOT 0.7 1.0   Iron/TIBC/Ferritin/ %Sat No results found for: IRON, TIBC, FERRITIN, IRONPCTSAT   RADIOGRAPHIC STUDIES: I have personally reviewed the radiological images as listed and agreed with the findings in the report. 12/17/2016 CT angio chest PE 1. No evidence of acute pulmonary embolism. 2. Moderate size bilateral pleural effusions with associated bibasilar atelectasis. These findings in conjunction with reflux of contrast into the IVC and hepatic veins suggest possible heart failure. No definite edema. 3. Atherosclerosis, including the coronary arteries. 4. Moderate size hiatal hernia. 5. Nonspecific lucent lesion in the right fifth rib laterally. This may be incidental, although apparently the patient has a history of plasmacytoma; as such, multiple myeloma not completely excluded. 02/26/2018 MR pelvis wo contrast 1. No hip fracture, dislocation or avascular necrosis. 2. Mild osteoarthritis of bilateral hips 02/26/2018  MRI spine wo contrast 1. T12 compression fracture described on dedicated exam. 2. No acute finding in the lumbar spine. 3. Lower lumbar facet arthropathy with L4-5 and L5-S1 grade 1 anterolisthesis. 4. Mild for age generalized disc degeneration. 5. No compressive stenosis.  06/15/2018 Thoracic MRI  wo contrast Interval development of acute/subacute T11 compression fracture, with loss of 50% height and 2-3 mm of retropulsion without cord impingement.  ASSESSMENT & PLAN:  1. History of plasmacytoma   2. Abnormal MRI, neck   3. Neck pain    #Abnormal MRI neck, MRI images were independent reviewed by me and discussed with patient. Chronic postsurgical changes of cervical spines.  Abnormal signal and enhancement throughout the clivus which was not completely visualized in current study.  Labs are reviewed and discussed with patient. Patient has chronically elevated lambda light chain, light chain ratio has remained stable. I recommend to proceed with skeletal survey, if nonconclusive, will obtain PET scan for further evaluation. So discussed about bone marrow biopsy. History of plasmacytoma, now have suspicious clivus lesions.  Suspect systemic plasma cell disorders. Normal hemoglobin and creatinine. Advised patient to avoid NSAIDs  Neck pain, continue Tylenol as needed.  Advised patient to avoid NSAIDs.  She may utilize Tylenol 3 if pain is not relieved by regular Tylenol.  Prescription was sent to pharmacy.  Side effects of narcotics were discussed with patient.   Orders Placed This Encounter  Procedures  . DG Bone Survey Met    Standing Status:   Future    Number of Occurrences:   1    Standing Expiration Date:   08/04/2021    Order Specific Question:   Reason for Exam (SYMPTOM  OR DIAGNOSIS REQUIRED)    Answer:   histoy of plasmacytoma, Skull lesions on MRI    Order Specific Question:   Preferred imaging location?  Answer:   [facility]    Order Specific Question:   Radiology Contrast Protocol - do NOT remove file path    Answer:   [email or web address]    All questions were answered. The patient knows to call the clinic with any problems questions or concerns.  Return of visit: To be determined  Earlie Server, MD, PhD Hematology  Oncology [facility] Pager- [phone]

## 2020-08-07 ENCOUNTER — Other Ambulatory Visit: Payer: Self-pay | Admitting: Oncology

## 2020-08-07 ENCOUNTER — Telehealth: Payer: Self-pay

## 2020-08-07 DIAGNOSIS — M899 Disorder of bone, unspecified: Secondary | ICD-10-CM

## 2020-08-07 DIAGNOSIS — Z8579 Personal history of other malignant neoplasms of lymphoid, hematopoietic and related tissues: Secondary | ICD-10-CM

## 2020-08-07 DIAGNOSIS — R9389 Abnormal findings on diagnostic imaging of other specified body structures: Secondary | ICD-10-CM

## 2020-08-07 NOTE — Telephone Encounter (Signed)
Pt's daughter Neoma Laming notified. Ok per [employer]. Message sent to scheduling pool.

## 2020-08-07 NOTE — Telephone Encounter (Signed)
PET scheduled on 8/30.

## 2020-08-07 NOTE — Telephone Encounter (Signed)
-----   Message from Earlie Server, MD sent at 08/07/2020  9:36 AM EDT ----- Please schedule pt to have PET scan whole body. Ordered.

## 2020-08-11 ENCOUNTER — Ambulatory Visit: Payer: Medicare Other

## 2020-08-21 ENCOUNTER — Ambulatory Visit
Admission: RE | Admit: 2020-08-21 | Discharge: 2020-08-21 | Disposition: A | Discharge status: Home / Self Care | Payer: Medicare Other | Source: Ambulatory Visit | Attending: Oncology | Admitting: Oncology

## 2020-08-21 ENCOUNTER — Other Ambulatory Visit: Payer: Self-pay

## 2020-08-21 DIAGNOSIS — R9389 Abnormal findings on diagnostic imaging of other specified body structures: Secondary | ICD-10-CM

## 2020-08-21 DIAGNOSIS — Z8579 Personal history of other malignant neoplasms of lymphoid, hematopoietic and related tissues: Secondary | ICD-10-CM | POA: Insufficient documentation

## 2020-08-21 DIAGNOSIS — M899 Disorder of bone, unspecified: Secondary | ICD-10-CM | POA: Diagnosis present

## 2020-08-21 LAB — GLUCOSE, CAPILLARY: Glucose-Capillary: 114 mg/dL — ABNORMAL HIGH (ref 70–99)

## 2020-08-21 MED ORDER — FLUDEOXYGLUCOSE F - 18 (FDG) INJECTION
7.4000 | Freq: Once | INTRAVENOUS | Status: AC | PRN
  Administered 2020-08-21: 8.05 via INTRAVENOUS

## 2020-08-25 ENCOUNTER — Telehealth: Payer: Self-pay | Admitting: *Deleted

## 2020-08-25 ENCOUNTER — Other Ambulatory Visit: Payer: Self-pay

## 2020-08-25 DIAGNOSIS — Z8579 Personal history of other malignant neoplasms of lymphoid, hematopoietic and related tissues: Secondary | ICD-10-CM

## 2020-08-25 NOTE — Telephone Encounter (Signed)
Request for Bone marrow biopsy faxed to centralized scheduling. Will contact patient with appt once it is scheduled.

## 2020-08-25 NOTE — Telephone Encounter (Signed)
Dr. Tasia Catchings, would you like to have a BMB scheduled for patient?

## 2020-08-25 NOTE — Telephone Encounter (Signed)
Patient's daughter called triage to set up Bone marrow Biopsy. Per Ranelle Oyster RN the patient care team makes the appointments for Biopsies.

## 2020-08-29 ENCOUNTER — Telehealth: Payer: Self-pay | Admitting: *Deleted

## 2020-08-29 NOTE — Telephone Encounter (Signed)
Correction to last note the patient is having a bone marrow biopsy.

## 2020-08-29 NOTE — Telephone Encounter (Signed)
Patient's daughter, Neoma Laming, notified of appts.

## 2020-08-29 NOTE — Telephone Encounter (Signed)
Done..  Pt will RTC on 09/18/20 @ 10:45

## 2020-08-29 NOTE — Telephone Encounter (Signed)
Patient scheduled for bone marrow biospsy on Tues 9/14 @ 8:30 for a 9:30 procedure.   Amy [name], please schedule patient for MD only follow up 10 days after biopsy. I will call pt with appts once scheduled.   Thanks

## 2020-08-29 NOTE — Telephone Encounter (Signed)
Patient's daughter called requesting Bone Marrow Transplant appointment. Writer called and gave daughter the following information: Appt 09/05/2020  Medical mall arrive by 8:30 AM.

## 2020-09-01 ENCOUNTER — Other Ambulatory Visit: Payer: Self-pay | Admitting: Radiology

## 2020-09-04 ENCOUNTER — Other Ambulatory Visit: Payer: Self-pay | Admitting: Student

## 2020-09-05 ENCOUNTER — Other Ambulatory Visit: Payer: Self-pay

## 2020-09-05 ENCOUNTER — Ambulatory Visit
Admission: RE | Admit: 2020-09-05 | Discharge: 2020-09-05 | Disposition: A | Discharge status: Home / Self Care | Payer: Medicare Other | Source: Ambulatory Visit | Attending: Oncology | Admitting: Oncology

## 2020-09-05 ENCOUNTER — Encounter (INDEPENDENT_AMBULATORY_CARE_PROVIDER_SITE_OTHER): Payer: Self-pay

## 2020-09-05 DIAGNOSIS — C9030 Solitary plasmacytoma not having achieved remission: Secondary | ICD-10-CM | POA: Diagnosis present

## 2020-09-05 DIAGNOSIS — Z8579 Personal history of other malignant neoplasms of lymphoid, hematopoietic and related tissues: Secondary | ICD-10-CM

## 2020-09-05 LAB — CBC WITH DIFFERENTIAL/PLATELET
Abs Immature Granulocytes: 0.01 10*3/uL (ref 0.00–0.07)
Basophils Absolute: 0 10*3/uL (ref 0.0–0.1)
Basophils Relative: 1 %
Eosinophils Absolute: 0.1 10*3/uL (ref 0.0–0.5)
Eosinophils Relative: 1 %
HCT: 43.4 % (ref 36.0–46.0)
Hemoglobin: 14.4 g/dL (ref 12.0–15.0)
Immature Granulocytes: 0 %
Lymphocytes Relative: 25 %
Lymphs Abs: 1.7 10*3/uL (ref 0.7–4.0)
MCH: 28.7 pg (ref 26.0–34.0)
MCHC: 33.2 g/dL (ref 30.0–36.0)
MCV: 86.6 fL (ref 80.0–100.0)
Monocytes Absolute: 0.5 10*3/uL (ref 0.1–1.0)
Monocytes Relative: 7 %
Neutro Abs: 4.5 10*3/uL (ref 1.7–7.7)
Neutrophils Relative %: 66 %
Platelets: 187 10*3/uL (ref 150–400)
RBC: 5.01 MIL/uL (ref 3.87–5.11)
RDW: 14.1 % (ref 11.5–15.5)
WBC: 6.8 10*3/uL (ref 4.0–10.5)
nRBC: 0 % (ref 0.0–0.2)

## 2020-09-05 MED ORDER — MIDAZOLAM HCL 2 MG/2ML IJ SOLN
INTRAMUSCULAR | Status: AC
  Filled 2020-09-05: qty 2

## 2020-09-05 MED ORDER — MIDAZOLAM HCL 2 MG/2ML IJ SOLN
INTRAMUSCULAR | Status: AC | PRN
  Administered 2020-09-05 (×2): 0.5 mg via INTRAVENOUS

## 2020-09-05 MED ORDER — HYDROCODONE-ACETAMINOPHEN 5-325 MG PO TABS: 1.0000 | ORAL_TABLET | ORAL | Status: DC | PRN

## 2020-09-05 MED ORDER — FENTANYL CITRATE (PF) 100 MCG/2ML IJ SOLN
INTRAMUSCULAR | Status: AC | PRN
Start: 2020-09-05 — End: 2020-09-05
  Administered 2020-09-05 (×2): 25 ug via INTRAVENOUS

## 2020-09-05 MED ORDER — FENTANYL CITRATE (PF) 100 MCG/2ML IJ SOLN
INTRAMUSCULAR | Status: AC
Start: 2020-09-05 — End: 2020-09-05
  Filled 2020-09-05: qty 2

## 2020-09-05 MED ORDER — SODIUM CHLORIDE 0.9 % IV SOLN: INTRAVENOUS | Status: DC

## 2020-09-05 MED ORDER — HEPARIN SOD (PORK) LOCK FLUSH 100 UNIT/ML IV SOLN
INTRAVENOUS | Status: AC
  Filled 2020-09-05: qty 5

## 2020-09-05 NOTE — Procedures (Signed)
  Procedure: CT R iliac bone marrow biopsy    EBL:   minimal Complications:  none immediate  See full dictation in [facility] PACS.  D. Jaydon Avina MD Main # [phone] Pager  [phone]    

## 2020-09-05 NOTE — H&P (Signed)
Chief Complaint: Patient was seen in consultation today for a bone marrow biopsy.  Referring Physician(s): Yu,Zhou  Supervising Physician: Arne Cleveland  Patient Status: [facility] - Out-pt  History of Present Illness: Amy [name] is a 86 y.o. female with a past medical history significant for depression, arthritis, GERD, neuromuscular disorder, HTN, HLD, DVT and plasmacytosis who presents today for a bone marrow biopsy. [name] was first diagnosed with neck plasmacytoma in 2005 and was originally followed at [facility], however she is now followed by Dr. Tasia Catchings and was first seen by their office on 08/04/20 to discuss MRI cervical spine results which showed abnormal signal and enhancement throughout the clivus. During her most recent oncology visit she also noted increased neck pain near this area. A PET scan as performed on 8/30 which noted hypermetabolic bone lesions in the midline scalp near the vertex, clivus manubrium, bilateral ribs, thoracic spine, bony pelvis and proximal right femur compatible with known plasmacytoma. IR has been asked to perform a bone marrow biopsy due to suspected systemic plasma cell disorder.   [name] denies any complaints today - she does get neck pain occasionally which is resolved with Tylenol #3 which she takes as needed. She occasionally has right sided headaches but not severe. She has 3 daughters and 3 grandchildren whom she is close to. She lives with her husband, her daughter and her husband. She believes she had a bone marrow biopsy before at [facility] many years ago. She understands the procedure today and is agreeable to proceed as planned.   Past Medical History:  Diagnosis Date  . Arthritis   . Depression   . Diverticulitis   . DVT (deep venous thrombosis) (HCC)    Right leg   . Dyspnea   . Family history of adverse reaction to anesthesia    PONV sister  . GERD (gastroesophageal reflux disease)   . History of hiatal hernia   . Hyperlipidemia     . Hypertension   . Mitral valve regurgitation   . Neuromuscular disorder ([place])   . 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[place])   . Pneumonia   . Renal disorder     Past Surgical History:  Procedure Laterality Date  . ABDOMINAL HYSTERECTOMY     partial  . ABDOMINAL SURGERY    . bladder tack  1990  . BREAST BIOPSY  1974  . CATARACT EXTRACTION, BILATERAL    . CERVICAL SPINE SURGERY     tumor removed  . HERNIA REPAIR    . KYPHOPLASTY N/A 03/12/2018   Procedure: [id number];  Surgeon: Hessie Knows, MD;  Location: [facility] ORS;  Service: Orthopedics;  Laterality: N/A;  . KYPHOPLASTY N/A 06/30/2018   Procedure: Marcos Eke;  Surgeon: Hessie Knows, MD;  Location: [facility] ORS;  Service: Orthopedics;  Laterality: N/A;  . plasmacytoma resection  2005   lumbar    Allergies: Doxycycline hyclate, Levofloxacin, and Sulfa antibiotics  Medications: Prior to Admission medications   Medication Sig Start Date End Date Taking? Authorizing Provider  acetaminophen (TYLENOL) 500 MG tablet Take 500 mg by mouth every 4 (four) hours as needed.    [provider]  acetaminophen-codeine (TYLENOL #3) 300-30 MG tablet Take 1 tablet by mouth every 6 (six) hours as needed for moderate pain. 08/04/20   Earlie Server, MD  alendronate (FOSAMAX) 70 MG tablet Take 70 mg by mouth once a week. Sunday 06/01/18   [provider]  aspirin EC 81 MG tablet Take 1 tablet by mouth daily.    [provider]  cholecalciferol (VITAMIN D) 400 units TABS tablet Take 400 Units by mouth daily.    [provider]  cyclobenzaprine (FLEXERIL) 5 MG tablet Take 5-10 mg by mouth 3 (three) times daily as needed for muscle spasms.    [provider]  diphenhydrAMINE (BENADRYL) 25 MG tablet Take 25 mg by mouth at bedtime.    [provider]  DULoxetine (CYMBALTA) 60 MG capsule Take 60 mg by mouth daily.    [provider]  esomeprazole (NEXIUM) 40 MG capsule Take by mouth. 07/11/20 07/11/21  [provider]  HYDROcodone-acetaminophen (NORCO) 5-325 MG tablet Take 1 tablet by mouth every 6 (six) hours as needed for moderate pain. Patient not taking: Reported on 12/14/2019 06/30/18   Hessie Knows, MD  losartan (COZAAR) 50 MG tablet Take 25 mg by mouth daily.     [provider]  Multiple Vitamin (MULTIVITAMIN WITH MINERALS) TABS tablet Take 1 tablet by mouth daily.    [provider]  NON FORMULARY Take 1 capsule by mouth daily. CBD oil Patient not taking: Reported on 08/04/2020    [provider]  Omega-3 Fatty Acids (FISH OIL) 1000 MG CAPS Take 2 capsules by mouth daily.     [provider]  omeprazole (PRILOSEC) 40 MG capsule Take 40 mg by mouth daily. 11/23/18 12/14/19  [provider]  Polyvinyl Alcohol-Povidone (REFRESH OP) Apply 1-2 drops to eye as needed (dry eyes).     [provider]  triamterene-hydrochlorothiazide (DYAZIDE) 37.5-25 MG capsule Take 1 capsule by mouth daily.    [provider]     Family History  Problem Relation Age of Onset  . Heart failure Mother   . Heart failure Father   . COPD Sister   . Prostate cancer Brother   . COPD Brother     Social History   Socioeconomic History  . Marital status: Married    Spouse name: Not on file  . Number of children: Not on file  . Years of education: Not on file  . Highest education level: Not on file  Occupational History  . Occupation: Retired  Tobacco Use  . Smoking status: Never Smoker  . Smokeless tobacco: Never Used  Vaping Use  . Vaping Use: Never used  Substance and Sexual Activity  . Alcohol use: No    Alcohol/week: 0.0 standard drinks  . Drug use: No  . Sexual activity: Never    Birth control/protection: Post-menopausal  Other Topics Concern  . Not on file  Social History Narrative  . Not on file   Social Determinants of Health   Financial Resource Strain:   . Difficulty of Paying Living Expenses: Not on file  Food  Insecurity:   . Worried About Charity fundraiser in the Last Year: Not on file  . Ran Out of Food in the Last Year: Not on file  Transportation Needs:   . Lack of Transportation (Medical): Not on file  . Lack of Transportation (Non-Medical): Not on file  Physical Activity:   . Days of Exercise per Week: Not on file  . Minutes of Exercise per Session: Not on file  Stress:   . Feeling of Stress : Not on file  Social Connections:   . Frequency of Communication with Friends and Family: Not on file  . Frequency of Social Gatherings with Friends and Family: Not on file  . Attends Religious Services: Not on file  . Active Member of Clubs or Organizations: Not on file  .  Attends Archivist Meetings: Not on file  . Marital Status: Not on file     Review of Systems: A 12 point ROS discussed and pertinent positives are indicated in the HPI above.  All other systems are negative.  Review of Systems  Constitutional: Negative for appetite change, chills and fever.  Respiratory: Negative for cough and shortness of breath.   Gastrointestinal: Negative for abdominal pain, nausea and vomiting.  Musculoskeletal: Negative for neck pain.  Neurological: Negative for headaches.    Vital Signs: BP 129/87   Pulse 92   Ht '5\' 5"'  (1.651 m)   Wt 142 lb (64.4 kg)   SpO2 95%   BMI 23.63 kg/m   Physical Exam Vitals reviewed.  Constitutional:      General: She is not in acute distress. HENT:     Head: Normocephalic.     Mouth/Throat:     Mouth: Mucous membranes are moist.     Pharynx: Oropharynx is clear. No oropharyngeal exudate or posterior oropharyngeal erythema.  Cardiovascular:     Rate and Rhythm: Normal rate and regular rhythm.  Pulmonary:     Effort: Pulmonary effort is normal.     Breath sounds: Normal breath sounds.  Abdominal:     General: There is no distension.     Palpations: Abdomen is soft.     Tenderness: There is no abdominal tenderness.  Skin:    General: Skin  is warm and dry.  Neurological:     Mental Status: She is alert and oriented to person, place, and time.  Psychiatric:        Mood and Affect: Mood normal.        Behavior: Behavior normal.        Thought Content: Thought content normal.        Judgment: Judgment normal.          Imaging: NM PET Image Initial (PI) Whole Body  Result Date: 08/21/2020 CLINICAL DATA:  Subsequent treatment strategy for plasmacytoma. Abnormal signal and enhancement identified in the clivus on recent cervical spine MRI. EXAM: NUCLEAR MEDICINE PET WHOLE BODY TECHNIQUE: 8.1 mCi F-18 FDG was injected intravenously. Full-ring PET imaging was performed from the head to foot after the radiotracer. CT data was obtained and used for attenuation correction and anatomic localization. Fasting blood glucose: 114 mg/dl COMPARISON:  Bone survey 08/04/2020.  MR cervical spine 08/02/2020. FINDINGS: Mediastinal blood pool activity: SUV max 3.3 HEAD/NECK: No focal soft tissue hypermetabolism in the head/neck. Incidental CT findings: none CHEST: No hypermetabolic mediastinal or hilar nodes. No suspicious pulmonary nodules on the CT scan. Incidental CT findings: Coronary artery calcification is evident. Atherosclerotic calcification is noted in the wall of the thoracic aorta. Large hiatal hernia contains approximately 50% of the stomach. Biapical pleuroparenchymal scarring noted in the lungs. No suspicious pulmonary nodule or mass. ABDOMEN/PELVIS: No abnormal hypermetabolic activity within the liver, pancreas, adrenal glands, or spleen. No hypermetabolic lymph nodes in the abdomen or pelvis. Incidental CT findings: There is marked intra and extrahepatic biliary duct dilatation, progressive since prior abdomen/pelvis CT of 06/27/2010. Common bile duct measures 14 mm diameter today compared to 11 mm on 06/27/2010. No obstructive etiology in the common bile duct remains dilated to the level of the ampulla. SKELETON: Patient's known clivus  lesion is markedly hypermetabolic with SUV max = 66.0. There is a small lucency in the midline skull near the vertex demonstrating SUV max = 6.6. 9 mm lesion in the right manubrium (109/3) demonstrates SUV max =  12.0.Subtle lesion in the lateral right fifth rib is hypermetabolic with SUV max = 2.7. Expansile posterior right twelfth rib lesion (image 156/3) demonstrates SUV max = 7.5. Expansile lesion in the anterior left ninth rib is hypermetabolic with SUV max = 93.5 (image 177/3).There is a small hypermetabolic focus in the L21 vertebral body adjacent to the augmentation, indeterminate. Hypermetabolic lesions are identified in the bony pelvis and proximal right femur. Index lesion in the right acetabulum measures 2.2 cm with SUV max = 22.2. Hypermetabolic lesions are seen in the right ischial tuberosity, posterior left iliac crest, and proximal right femur. Proximal right femoral lesion is a small cortical 5 mm abnormality visible on image 273/3. lesion in the posterior left iliac crest, involving the SI joint measures 2.1 cm but shows relatively low FDG uptake with SUV max = 3.4. Incidental CT findings: Bones are diffusely demineralized. Degenerative facet disease noted lower lumbar spine. EXTREMITIES: No abnormal hypermetabolic activity in the lower extremities. Incidental CT findings: none IMPRESSION: Hypermetabolic bone lesions identified in the midline scalp near the vertex,, clivus manubrium, bilateral ribs, thoracic spine, bony pelvis, and proximal right femur. Imaging features compatible with patient's known plasmacytoma. No unexpected hypermetabolism within the soft tissue organ anatomy on today's exam. Electronically Signed   By: Misty Stanley M.D.   On: 08/21/2020 12:52    Labs:  CBC: Recent Labs    12/06/19 1027 08/02/20 0954  WBC 6.7 8.9  HGB 13.7 13.4  HCT 43.7 40.8  PLT 176 212    COAGS: No results for input(s): INR, APTT in the last 8760 hours.  BMP: Recent Labs     12/06/19 1027 08/02/20 0954  NA 143 141  K 4.2 4.6  CL 104 101  CO2 27 29  GLUCOSE 124* 114*  BUN 28* 30*  CALCIUM 9.5 9.9  CREATININE 0.96 0.79  GFRNONAA 53* >60  GFRAA >60 >60    LIVER FUNCTION TESTS: Recent Labs    12/06/19 1027 08/02/20 0954  BILITOT 0.7 1.0  AST 40 27  ALT 37 24  ALKPHOS 63 50  PROT 6.8 6.8  ALBUMIN 4.1 4.0    TUMOR MARKERS: No results for input(s): AFPTM, CEA, CA199, CHROMGRNA in the last 8760 hours.  Assessment and Plan:  86 y/o F with history of plasmacytoma who presents today for a bone marrow biopsy to evaluate for possible multiple myeloma.  Patient has been NPO since midnight, no current anticoagulation/antiplatelet medications. Afebrile, CBC pending.  Risks and benefits of bone marrow biopsy was discussed with the patient and/or patient's family including, but not limited to bleeding, infection, damage to adjacent structures or low yield requiring additional tests.  All of the questions were answered and there is agreement to proceed.  Consent signed and in chart.  Thank you for this interesting consult.  I greatly enjoyed meeting Laurrie Toppin and look forward to participating in their care.  A copy of this report was sent to the requesting provider on this date.  Electronically Signed: Joaquim Nam, PA-C 09/05/2020, 8:13 AM   I spent a total of 30 Minutes  in face to face in clinical consultation, greater than 50% of which was counseling/coordinating care for bone marrow biopsy.

## 2020-09-05 NOTE — Discharge Instructions (Signed)
Moderate Conscious Sedation, Adult, Care After These instructions provide you with information about caring for yourself after your procedure. Your health care provider may also give you more specific instructions. Your treatment has been planned according to current medical practices, but problems sometimes occur. Call your health care provider if you have any problems or questions after your procedure. What can I expect after the procedure? After your procedure, it is common:  To feel sleepy for several hours.  To feel clumsy and have poor balance for several hours.  To have poor judgment for several hours.  To vomit if you eat too soon. Follow these instructions at home: For at least 24 hours after the procedure:   Do not: ? Participate in activities where you could fall or become injured. ? Drive. ? Use heavy machinery. ? Drink alcohol. ? Take sleeping pills or medicines that cause drowsiness. ? Make important decisions or sign legal documents. ? Take care of children on your own.  Rest. Eating and drinking  Follow the diet recommended by your health care provider.  If you vomit: ? Drink water, juice, or soup when you can drink without vomiting. ? Make sure you have little or no nausea before eating solid foods. General instructions  Have a responsible adult stay with you until you are awake and alert.  Take over-the-counter and prescription medicines only as told by your health care provider.  If you smoke, do not smoke without supervision.  Keep all follow-up visits as told by your health care provider. This is important. Contact a health care provider if:  You keep feeling nauseous or you keep vomiting.  You feel light-headed.  You develop a rash.  You have a fever. Get help right away if:  You have trouble breathing. This information is not intended to replace advice given to you by your health care provider. Make sure you discuss any questions you have  with your health care provider. Document Revised: 11/21/2017 Document Reviewed: 03/30/2016 Elsevier Patient Education  2020 [employer] Bone Marrow Aspiration and Bone Marrow Biopsy, Adult, Care After This sheet gives you information about how to care for yourself after your procedure. Your health care provider may also give you more specific instructions. If you have problems or questions, contact your health care provider. What can I expect after the procedure? After the procedure, it is common to have:  Mild pain and tenderness.  Swelling.  Bruising. Follow these instructions at home: Puncture site care   Follow instructions from your health care provider about how to take care of the puncture site. Make sure you: ? Wash your hands with soap and water before and after you change your bandage (dressing). If soap and water are not available, use hand sanitizer. ? Change your dressing as told by your health care provider.  Check your puncture site every day for signs of infection. Check for: ? More redness, swelling, or pain. ? Fluid or blood. ? Warmth. ? Pus or a bad smell. Activity  Return to your normal activities as told by your health care provider. Ask your health care provider what activities are safe for you.  Do not lift anything that is heavier than 10 lb (4.5 kg), or the limit that you are told, until your health care provider says that it is safe.  Do not drive for 24 hours if you were given a sedative during your procedure. General instructions   Take over-the-counter and prescription medicines only as told by your   health care provider.  Do not take baths, swim, or use a hot tub until your health care provider approves. Ask your health care provider if you may take showers. You may only be allowed to take sponge baths.  If directed, put ice on the affected area. To do this: ? Put ice in a plastic bag. ? Place a towel between your skin and the bag. ? Leave  the ice on for 20 minutes, 2-3 times a day.  Keep all follow-up visits as told by your health care provider. This is important. Contact a health care provider if:  Your pain is not controlled with medicine.  You have a fever.  You have more redness, swelling, or pain around the puncture site.  You have fluid or blood coming from the puncture site.  Your puncture site feels warm to the touch.  You have pus or a bad smell coming from the puncture site. Summary  After the procedure, it is common to have mild pain, tenderness, swelling, and bruising.  Follow instructions from your health care provider about how to take care of the puncture site and what activities are safe for you.  Take over-the-counter and prescription medicines only as told by your health care provider.  Contact a health care provider if you have any signs of infection, such as fluid or blood coming from the puncture site. This information is not intended to replace advice given to you by your health care provider. Make sure you discuss any questions you have with your health care provider. Document Revised: 04/27/2019 Document Reviewed: 04/27/2019 Elsevier Patient Education  2020 [employer] Inc[employer]  

## 2020-09-07 LAB — SURGICAL PATHOLOGY

## 2020-09-12 ENCOUNTER — Encounter (HOSPITAL_COMMUNITY): Payer: Self-pay | Admitting: Oncology

## 2020-09-15 ENCOUNTER — Encounter: Payer: Self-pay | Admitting: Oncology

## 2020-09-15 ENCOUNTER — Inpatient Hospital Stay: Payer: Medicare Other | Attending: Oncology | Admitting: Oncology

## 2020-09-15 ENCOUNTER — Other Ambulatory Visit: Payer: Self-pay

## 2020-09-15 VITALS — BP 139/83 | HR 91 | Temp 98.9°F | Resp 18

## 2020-09-15 DIAGNOSIS — M129 Arthropathy, unspecified: Secondary | ICD-10-CM | POA: Diagnosis not present

## 2020-09-15 DIAGNOSIS — Z79899 Other long term (current) drug therapy: Secondary | ICD-10-CM | POA: Insufficient documentation

## 2020-09-15 DIAGNOSIS — I10 Essential (primary) hypertension: Secondary | ICD-10-CM | POA: Insufficient documentation

## 2020-09-15 DIAGNOSIS — C9030 Solitary plasmacytoma not having achieved remission: Secondary | ICD-10-CM | POA: Diagnosis not present

## 2020-09-15 DIAGNOSIS — M5412 Radiculopathy, cervical region: Secondary | ICD-10-CM | POA: Insufficient documentation

## 2020-09-15 DIAGNOSIS — R55 Syncope and collapse: Secondary | ICD-10-CM | POA: Diagnosis not present

## 2020-09-15 DIAGNOSIS — Z86718 Personal history of other venous thrombosis and embolism: Secondary | ICD-10-CM | POA: Diagnosis not present

## 2020-09-15 DIAGNOSIS — G893 Neoplasm related pain (acute) (chronic): Secondary | ICD-10-CM | POA: Diagnosis not present

## 2020-09-15 DIAGNOSIS — I340 Nonrheumatic mitral (valve) insufficiency: Secondary | ICD-10-CM | POA: Insufficient documentation

## 2020-09-15 DIAGNOSIS — Z8042 Family history of malignant neoplasm of prostate: Secondary | ICD-10-CM | POA: Insufficient documentation

## 2020-09-15 DIAGNOSIS — Z90710 Acquired absence of both cervix and uterus: Secondary | ICD-10-CM | POA: Diagnosis not present

## 2020-09-15 DIAGNOSIS — J910 Malignant pleural effusion: Secondary | ICD-10-CM | POA: Insufficient documentation

## 2020-09-15 DIAGNOSIS — E785 Hyperlipidemia, unspecified: Secondary | ICD-10-CM | POA: Diagnosis not present

## 2020-09-15 DIAGNOSIS — F329 Major depressive disorder, single episode, unspecified: Secondary | ICD-10-CM | POA: Diagnosis not present

## 2020-09-15 DIAGNOSIS — K219 Gastro-esophageal reflux disease without esophagitis: Secondary | ICD-10-CM | POA: Diagnosis not present

## 2020-09-15 MED ORDER — ACETAMINOPHEN-CODEINE #3 300-30 MG PO TABS
1.0000 | ORAL_TABLET | Freq: Two times a day (BID) | ORAL | 0 refills | Status: DC | PRN
Start: 2020-09-15 — End: 2023-09-09

## 2020-09-15 NOTE — Progress Notes (Signed)
Hematology/Oncology  Follow up note [facility] [phone] [phone]   Patient Care Team: Neita Goodnight as PCP - General (Internal Medicine) Ubaldo Glassing Javier Docker, MD as Consulting Physician (Cardiology) Vladimir Crofts, MD (Neurology) Earlie Server, MD as Consulting Physician (Oncology)  REFERRING PROVIDER: Tomasa Rand, PA-C CHIEF COMPLAINTS/REASON FOR VISIT:  Evaluation of history of plasmacytosis.   HISTORY OF PRESENTING ILLNESS:  Amy [name] is a  86 y.o.  female with PMH listed below who was referred to me for evaluation of history of plasmacytosis. Patient wants to establish care locally.  Previously follows up with Dr.Gasparetto at [facility] center, last seeon on 07/02/2017.  Patient has a history of neck plasmacytoma, diagnosed in 2005. Reviewed her oncology records and oncologist note.  #Presented to [facility] ER 01/24/04 with confirmation of C2 expansile lytic lesion.  # SPEP normal with immunofixation with a very faint IgA kappa 01/25/04. UPEP with immunofixation demonstrating a monoclonal lambda light chain component.  #. Re-presented to [facility] ER on 02/13/04 and was admitted with severe pain in the cervical spine.  #. Bone survey performed 02/13/04 demonstrating lytic lesion involving C2 but no other definitive evidence of lytic lesions though questionable skull lucencies were present.  #. MRI of C spine 02/14/04 demonstrated an expansile lesion in C2.  # SPEP - normal with very faint IgA kappa component. UPE - monoclonal lambda light chain component.  # CT guided C2 biopsy performed 02/16/04 demonstrating plasma cells.  # BM BX 02/13/04 - 5% polyclonal plasma cell population of 40% cellular bone marrow. FISH panel performed on this material demonstrates no evidence of cytogenetic abnormalities by [facility] panel. Congo red stain for amyloid is negative #. C1-C4 posterior decompression and fusion performed  02/21/04.  # Underwent 50 Gy of radiotherapy completed 07/16/04 to cervical spine by Dr. Clemon Chambers.   08/02/2020, MRI cervical spine showed abnormal signal and enhancement throughout the clivus which was incompletely visualized with current study Status post C2-4 posterior fusion.  The central canal is open at each level.  No change.  No marked changes in cervical spondylosis which appears worst at C4-5 and C5-6 as described above  08/21/2020, PET scan showed hypermetabolic bone lesions identified in the midline scalp near the vertex, clivus manubrium, bilateral ribs, thoracic spine, bony pelvis, proximal right femur.  No unexpected hypermetabolic some within the soft tissue organ anatomy on that exam   INTERVAL HISTORY Amy [name] is a 86 y.o. female who has above history reviewed by me today presents for follow up visit for plasmacytoma, increased neck pain. Problems and complaints are listed below: Patient has had a bone marrow biopsy done She was accompanied by her daughter to here to discuss bone marrow biopsy results and further management plan.  Patient continues to have intermittent neck pain for which Tylenol does not work.  She tried to Tylenol 3 which helps her pain.  Review of Systems  Constitutional: Negative for appetite change, chills, fatigue and fever.  HENT:   Negative for hearing loss and voice change.   Eyes: Negative for eye problems.  Respiratory: Negative for chest tightness and cough.   Cardiovascular: Negative for chest pain.  Gastrointestinal: Negative for abdominal distention, abdominal pain and blood in stool.  Endocrine: Negative for hot flashes.  Genitourinary: Negative for difficulty urinating and frequency.   Musculoskeletal: Negative for arthralgias.       Neck pain  Skin: Negative for itching and rash.  Neurological: Negative  for extremity weakness.  Hematological: Negative for adenopathy.  Psychiatric/Behavioral: Negative for confusion.     MEDICAL HISTORY:  Past Medical History:  Diagnosis Date  . Arthritis   . Depression   . Diverticulitis   . DVT (deep venous thrombosis) (HCC)    Right leg   . Dyspnea   . Family history of adverse reaction to anesthesia    PONV sister  . GERD (gastroesophageal reflux disease)   . History of hiatal hernia   . Hyperlipidemia   . Hypertension   . Mitral valve regurgitation   . Neuromuscular disorder ([place])   . 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[place])   . Pneumonia   . Renal disorder     SURGICAL HISTORY: Past Surgical History:  Procedure Laterality Date  . ABDOMINAL HYSTERECTOMY     partial  . ABDOMINAL SURGERY    . bladder tack  1990  . BREAST BIOPSY  1974  . CATARACT EXTRACTION, BILATERAL    . CERVICAL SPINE SURGERY     tumor removed  . HERNIA REPAIR    . KYPHOPLASTY N/A 03/12/2018   Procedure: [id number];  Surgeon: Hessie Knows, MD;  Location: [facility] ORS;  Service: Orthopedics;  Laterality: N/A;  . KYPHOPLASTY N/A 06/30/2018   Procedure: Marcos Eke;  Surgeon: Hessie Knows, MD;  Location: [facility] ORS;  Service: Orthopedics;  Laterality: N/A;  . plasmacytoma resection  2005   lumbar    SOCIAL HISTORY: Social History   Socioeconomic History  . Marital status: Married    Spouse name: Not on file  . Number of children: Not on file  . Years of education: Not on file  . Highest education level: Not on file  Occupational History  . Occupation: Retired  Tobacco Use  . Smoking status: Never Smoker  . Smokeless tobacco: Never Used  Vaping Use  . Vaping Use: Never used  Substance and Sexual Activity  . Alcohol use: No    Alcohol/week: 0.0 standard drinks  . Drug use: No  . Sexual activity: Never    Birth control/protection: Post-menopausal  Other Topics Concern  . Not on file  Social History Narrative  . Not on file   Social Determinants of Health   Financial Resource Strain:   . Difficulty of Paying Living Expenses: Not on file  Food Insecurity:   . Worried About  Charity fundraiser in the Last Year: Not on file  . Ran Out of Food in the Last Year: Not on file  Transportation Needs:   . Lack of Transportation (Medical): Not on file  . Lack of Transportation (Non-Medical): Not on file  Physical Activity:   . Days of Exercise per Week: Not on file  . Minutes of Exercise per Session: Not on file  Stress:   . Feeling of Stress : Not on file  Social Connections:   . Frequency of Communication with Friends and Family: Not on file  . Frequency of Social Gatherings with Friends and Family: Not on file  . Attends Religious Services: Not on file  . Active Member of Clubs or Organizations: Not on file  . Attends Archivist Meetings: Not on file  . Marital Status: Not on file  Intimate Partner Violence:   . Fear of Current or Ex-Partner: Not on file  . Emotionally Abused: Not on file  . Physically Abused: Not on file  . Sexually Abused: Not on file    FAMILY HISTORY: Family History  Problem Relation Age of Onset  . Heart failure Mother   .  Heart failure Father   . COPD Sister   . Prostate cancer Brother   . COPD Brother     ALLERGIES:  is allergic to doxycycline hyclate, levofloxacin, and sulfa antibiotics.  MEDICATIONS:  Current Outpatient Medications  Medication Sig Dispense Refill  . acetaminophen (TYLENOL) 500 MG tablet Take 500 mg by mouth every 4 (four) hours as needed.    Marland Kitchen alendronate (FOSAMAX) 70 MG tablet Take 70 mg by mouth once a week. Sunday    . aspirin EC 81 MG tablet Take 1 tablet by mouth daily.    . cholecalciferol (VITAMIN D) 400 units TABS tablet Take 400 Units by mouth daily.    . cyclobenzaprine (FLEXERIL) 5 MG tablet Take 5-10 mg by mouth 3 (three) times daily as needed for muscle spasms.    . diphenhydrAMINE (BENADRYL) 25 MG tablet Take 25 mg by mouth at bedtime.    . DULoxetine (CYMBALTA) 60 MG capsule Take 60 mg by mouth daily.    Marland Kitchen esomeprazole (NEXIUM) 40 MG capsule Take by mouth.    . losartan (COZAAR)  50 MG tablet Take 25 mg by mouth daily.     . Melatonin 10 MG CAPS Take by mouth at bedtime as needed.    . Multiple Vitamin (MULTIVITAMIN WITH MINERALS) TABS tablet Take 1 tablet by mouth daily.    . Multiple Vitamins-Minerals (PRESERVISION AREDS PO) Take by mouth.    . Omega-3 Fatty Acids (FISH OIL) 1000 MG CAPS Take 2 capsules by mouth daily.     . Polyvinyl Alcohol-Povidone (REFRESH OP) Apply 1-2 drops to eye as needed (dry eyes).     . triamterene-hydrochlorothiazide (DYAZIDE) 37.5-25 MG capsule Take 1 capsule by mouth daily.    Marland Kitchen acetaminophen-codeine (TYLENOL #3) 300-30 MG tablet Take 1 tablet by mouth every 6 (six) hours as needed for moderate pain. (Patient not taking: Reported on 09/15/2020) 30 tablet 0  . HYDROcodone-acetaminophen (NORCO) 5-325 MG tablet Take 1 tablet by mouth every 6 (six) hours as needed for moderate pain. (Patient not taking: Reported on 12/14/2019) 15 tablet 0   No current facility-administered medications for this visit.     PHYSICAL EXAMINATION: ECOG PERFORMANCE STATUS: 1 - Symptomatic but completely ambulatory Vitals:   09/15/20 1038  BP: 139/83  Pulse: 91  Resp: 18  Temp: 98.9 F (37.2 C)   Filed Weights    Physical Exam Constitutional:      General: She is not in acute distress.    Comments: thin  HENT:     Head: Normocephalic and atraumatic.  Eyes:     General: No scleral icterus.    Pupils: Pupils are equal, round, and reactive to light.  Neck:     Comments: Scar from previous decompression surgery. No palpable mass or tenderness.  Cardiovascular:     Rate and Rhythm: Normal rate and regular rhythm.     Heart sounds: Murmur heard.   Pulmonary:     Effort: Pulmonary effort is normal. No respiratory distress.     Breath sounds: No wheezing.  Abdominal:     General: Bowel sounds are normal. There is no distension.     Palpations: Abdomen is soft. There is no mass.     Tenderness: There is no abdominal tenderness.  Musculoskeletal:         General: No deformity. Normal range of motion.     Cervical back: Normal range of motion and neck supple.  Skin:    General: Skin is warm and dry.  Findings: No erythema or rash.  Neurological:     Mental Status: She is alert and oriented to person, place, and time. Mental status is at baseline.     Cranial Nerves: No cranial nerve deficit.     Coordination: Coordination normal.  Psychiatric:        Mood and Affect: Mood normal.      LABORATORY DATA:  I have reviewed the data as listed Lab Results  Component Value Date   WBC 6.8 09/05/2020   HGB 14.4 09/05/2020   HCT 43.4 09/05/2020   MCV 86.6 09/05/2020   PLT 187 09/05/2020   Recent Labs    12/06/19 1027 08/02/20 0954  NA 143 141  K 4.2 4.6  CL 104 101  CO2 27 29  GLUCOSE 124* 114*  BUN 28* 30*  CREATININE 0.96 0.79  CALCIUM 9.5 9.9  GFRNONAA 53* >60  GFRAA >60 >60  PROT 6.8 6.8  ALBUMIN 4.1 4.0  AST 40 27  ALT 37 24  ALKPHOS 63 50  BILITOT 0.7 1.0   Iron/TIBC/Ferritin/ %Sat No results found for: IRON, TIBC, FERRITIN, IRONPCTSAT   RADIOGRAPHIC STUDIES: I have personally reviewed the radiological images as listed and agreed with the findings in the report. 12/17/2016 CT angio chest PE 1. No evidence of acute pulmonary embolism. 2. Moderate size bilateral pleural effusions with associated bibasilar atelectasis. These findings in conjunction with reflux of contrast into the IVC and hepatic veins suggest possible heart failure. No definite edema. 3. Atherosclerosis, including the coronary arteries. 4. Moderate size hiatal hernia. 5. Nonspecific lucent lesion in the right fifth rib laterally. This may be incidental, although apparently the patient has a history of plasmacytoma; as such, multiple myeloma not completely excluded. 02/26/2018 MR pelvis wo contrast 1. No hip fracture, dislocation or avascular necrosis. 2. Mild osteoarthritis of bilateral hips 02/26/2018  MRI spine wo contrast 1. T12 compression  fracture described on dedicated exam. 2. No acute finding in the lumbar spine. 3. Lower lumbar facet arthropathy with L4-5 and L5-S1 grade 1 anterolisthesis. 4. Mild for age generalized disc degeneration. 5. No compressive stenosis.  06/15/2018 Thoracic MRI wo contrast Interval development of acute/subacute T11 compression fracture, with loss of 50% height and 2-3 mm of retropulsion without cord impingement.  ASSESSMENT & PLAN:  1. Plasmacytoma of bone ([place])   2. Neoplasm related pain    #Likely multiple bone plasmacytoma  Bone marrow biopsy was reviewed and discussed with patient. There is slightly hypercellular bone marrow for age with trilineage hematopoiesis and 1% plasma cells. The plasma cell appears to be polyclonal staining for kappa and lambda light chains.  Findings are not diagnostic of plasma cell neoplasm.  I lengthy discussion with patient and daughter.  Clinically she does not have bone marrow involvement of plasma cell neoplasm.  With her history of plasmacytoma, PET scan findings are consistent with multiple plasmacytoma in the bones.  A small possibility of false negative bone marrow biopsy was discussed.  Other possibility of metastatic bone lesions due to other cancer was discussed with the patient.  However no other unexpected hypermetabolic him on a PET scan. I discussed about the biopsy of one of her index bone lesions for clarification.  Patient declines biopsy due to her advanced age. Her labs does not show any kidney impairment, anemia.  I do not recommend any systemic treatment. I have referred patient to establish care with radiation oncology to see if she will benefit from palliative radiation to her index bone lesions  to help with her symptoms or if any structure threatening lesions.   Neck pain, continue Tylenol 3 every 12 hours as needed for neck pain.  Orders Placed This Encounter  Procedures  . CBC with Differential/Platelet    Standing Status:    Future    Standing Expiration Date:   09/15/2021  . Comprehensive metabolic panel    Standing Status:   Future    Standing Expiration Date:   09/15/2021  . Multiple Myeloma Panel (SPEP&IFE w/QIG)    Standing Status:   Future    Standing Expiration Date:   09/15/2021  . Kappa/lambda light chains    Standing Status:   Future    Standing Expiration Date:   09/15/2021  . Ambulatory referral to Radiation Oncology    Referral Priority:   Routine    Referral Type:   Consultation    Referral Reason:   Specialty Services Required    Referred to Provider:   Noreene Filbert, MD    Requested Specialty:   Radiation Oncology    Number of Visits Requested:   1    All questions were answered. The patient knows to call the clinic with any problems questions or concerns.  Return of visit: 3 months  Earlie Server, MD, PhD Hematology Oncology [facility] Pager- [phone]

## 2020-09-15 NOTE — Progress Notes (Signed)
Pt here for follow up. No new concerns voiced.   

## 2020-09-20 ENCOUNTER — Encounter (INDEPENDENT_AMBULATORY_CARE_PROVIDER_SITE_OTHER): Payer: Medicare Other | Admitting: Ophthalmology

## 2020-09-20 ENCOUNTER — Other Ambulatory Visit: Payer: Self-pay

## 2020-09-20 DIAGNOSIS — H35033 Hypertensive retinopathy, bilateral: Secondary | ICD-10-CM | POA: Diagnosis not present

## 2020-09-20 DIAGNOSIS — H353231 Exudative age-related macular degeneration, bilateral, with active choroidal neovascularization: Secondary | ICD-10-CM

## 2020-09-20 DIAGNOSIS — H43813 Vitreous degeneration, bilateral: Secondary | ICD-10-CM | POA: Diagnosis not present

## 2020-09-20 DIAGNOSIS — I10 Essential (primary) hypertension: Secondary | ICD-10-CM

## 2020-09-21 ENCOUNTER — Encounter: Payer: Self-pay | Admitting: Radiation Oncology

## 2020-09-21 ENCOUNTER — Ambulatory Visit
Admission: RE | Admit: 2020-09-21 | Discharge: 2020-09-21 | Disposition: A | Discharge status: Home / Self Care | Payer: Medicare Other | Source: Ambulatory Visit | Attending: Radiation Oncology | Admitting: Radiation Oncology

## 2020-09-21 VITALS — BP 151/84 | HR 93 | Temp 98.2°F | Wt 139.0 lb

## 2020-09-21 DIAGNOSIS — I10 Essential (primary) hypertension: Secondary | ICD-10-CM | POA: Diagnosis not present

## 2020-09-21 DIAGNOSIS — E785 Hyperlipidemia, unspecified: Secondary | ICD-10-CM | POA: Diagnosis not present

## 2020-09-21 DIAGNOSIS — Z7982 Long term (current) use of aspirin: Secondary | ICD-10-CM | POA: Insufficient documentation

## 2020-09-21 DIAGNOSIS — K449 Diaphragmatic hernia without obstruction or gangrene: Secondary | ICD-10-CM | POA: Diagnosis not present

## 2020-09-21 DIAGNOSIS — F329 Major depressive disorder, single episode, unspecified: Secondary | ICD-10-CM | POA: Diagnosis not present

## 2020-09-21 DIAGNOSIS — C9030 Solitary plasmacytoma not having achieved remission: Secondary | ICD-10-CM | POA: Insufficient documentation

## 2020-09-21 DIAGNOSIS — Z8042 Family history of malignant neoplasm of prostate: Secondary | ICD-10-CM | POA: Diagnosis not present

## 2020-09-21 DIAGNOSIS — Z86718 Personal history of other venous thrombosis and embolism: Secondary | ICD-10-CM | POA: Insufficient documentation

## 2020-09-21 DIAGNOSIS — Z79899 Other long term (current) drug therapy: Secondary | ICD-10-CM | POA: Insufficient documentation

## 2020-09-21 DIAGNOSIS — I340 Nonrheumatic mitral (valve) insufficiency: Secondary | ICD-10-CM | POA: Diagnosis not present

## 2020-09-21 DIAGNOSIS — M129 Arthropathy, unspecified: Secondary | ICD-10-CM | POA: Insufficient documentation

## 2020-09-21 DIAGNOSIS — K219 Gastro-esophageal reflux disease without esophagitis: Secondary | ICD-10-CM | POA: Diagnosis not present

## 2020-09-21 NOTE — Consult Note (Signed)
NEW PATIENT EVALUATION  Name: Amy [name]  MRN: [id number]  Date:   09/21/2020     DOB: December 27, 1928   This 86 y.o. female patient presents to the clinic for initial evaluation of plasma cell neoplasm with multiple skeletal hits.  REFERRING PHYSICIAN: Carter-Adkins, Danielle*  CHIEF COMPLAINT:  Chief Complaint  Patient presents with   Consult    DIAGNOSIS: The encounter diagnosis was Plasmacytoma of bone ([place]).   PREVIOUS INVESTIGATIONS:  PET scan reviewed Pathology report reviewed Clinical notes reviewed  HPI: Patient is an 86 year old female with likely multiple bone plasmacytomas.  She does have a hypercellular bone marrow seen by Dr. Tasia Catchings.  She presented to [facility] ER with a C2 expansile lytic lesion.  Her SPEP had normal immunofixation with a monoclonal lambda light chain component.  She had a lytic lesion at C2 but no other definitive evidence of lytic lesions although she did have 6 questionable skull lucencies.  CT-guided biopsy of C2 showed plasma cells with cytogenetic abnormalities by [facility] panel so showing no demonstrable abnormality.  She had posterior compression and fusion of C1-C4.  She underwent 50 Gray radiation therapy to her cervical spine at [facility].  Repeat MRI scan of her cervical spine showed central canal open she does have spondylosis apparently worse at C4-C5.  CAT scan was performed showing hypermetabolic activity in the clivus manubrium, bilateral ribs, thoracic spine and proximal right femur.  She is currently on Tylenol for her neck pain.  She states she does have some headaches and some pain which may be associated with the largest area of hypermetabolic activity in the base of skull region.  She is also having some right hip pain corresponding to an area of hypermetabolic activity in the right acetabulum and right proximal femur.  PLANNED TREATMENT REGIMEN: Palliative radiation therapy to base of skull as well as right hip  PAST MEDICAL HISTORY:  has  a past medical history of Arthritis, Depression, Diverticulitis, DVT (deep venous thrombosis) ([place]), Dyspnea, Family history of adverse reaction to anesthesia, GERD (gastroesophageal reflux disease), History of hiatal hernia, Hyperlipidemia, Hypertension, Mitral valve regurgitation, Neuromuscular disorder ([place]), 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[place]), Pneumonia, and Renal disorder.    PAST SURGICAL HISTORY:  Past Surgical History:  Procedure Laterality Date   ABDOMINAL HYSTERECTOMY     partial   ABDOMINAL SURGERY     bladder tack  1990   BREAST BIOPSY  1974   CATARACT EXTRACTION, BILATERAL     CERVICAL SPINE SURGERY     tumor removed   HERNIA REPAIR     KYPHOPLASTY N/A 03/12/2018   Procedure: [id number];  Surgeon: Hessie Knows, MD;  Location: [facility] ORS;  Service: Orthopedics;  Laterality: N/A;   KYPHOPLASTY N/A 06/30/2018   Procedure: Marcos Eke;  Surgeon: Hessie Knows, MD;  Location: [facility] ORS;  Service: Orthopedics;  Laterality: N/A;   plasmacytoma resection  2005   lumbar    FAMILY HISTORY: family history includes COPD in her brother and sister; Heart failure in her father and mother; Prostate cancer in her brother.  SOCIAL HISTORY:  reports that she has never smoked. She has never used smokeless tobacco. She reports that she does not drink alcohol and does not use drugs.  ALLERGIES: Doxycycline hyclate, Levofloxacin, and Sulfa antibiotics  MEDICATIONS:  Current Outpatient Medications  Medication Sig Dispense Refill   acetaminophen-codeine (TYLENOL #3) 300-30 MG tablet Take 1 tablet by mouth every 12 (twelve) hours as needed for severe pain. 30 tablet 0   alendronate (FOSAMAX) 70 MG tablet Take  70 mg by mouth once a week. Sunday     aspirin EC 81 MG tablet Take 1 tablet by mouth daily.     cholecalciferol (VITAMIN D) 400 units TABS tablet Take 400 Units by mouth daily.     cyclobenzaprine (FLEXERIL) 5 MG tablet Take 5-10 mg by mouth 3 (three) times daily as needed for muscle  spasms.     diphenhydrAMINE (BENADRYL) 25 MG tablet Take 25 mg by mouth at bedtime.     DULoxetine (CYMBALTA) 60 MG capsule Take 60 mg by mouth daily.     esomeprazole (NEXIUM) 40 MG capsule Take by mouth.     losartan (COZAAR) 50 MG tablet Take 25 mg by mouth daily.      Melatonin 10 MG CAPS Take by mouth at bedtime as needed.     Multiple Vitamin (MULTIVITAMIN WITH MINERALS) TABS tablet Take 1 tablet by mouth daily.     Multiple Vitamins-Minerals (PRESERVISION AREDS PO) Take by mouth.     Omega-3 Fatty Acids (FISH OIL) 1000 MG CAPS Take 2 capsules by mouth daily.      Polyvinyl Alcohol-Povidone (REFRESH OP) Apply 1-2 drops to eye as needed (dry eyes).      triamterene-hydrochlorothiazide (DYAZIDE) 37.5-25 MG capsule Take 1 capsule by mouth daily.     No current facility-administered medications for this encounter.    ECOG PERFORMANCE STATUS:  2 - Symptomatic, <50% confined to bed  REVIEW OF SYSTEMS: Patient denies any weight loss, fatigue, weakness, fever, chills or night sweats. Patient denies any loss of vision, blurred vision. Patient denies any ringing  of the ears or hearing loss. No irregular heartbeat. Patient denies heart murmur or history of fainting. Patient denies any chest pain or pain radiating to her upper extremities. Patient denies any shortness of breath, difficulty breathing at night, cough or hemoptysis. Patient denies any swelling in the lower legs. Patient denies any nausea vomiting, vomiting of blood, or coffee ground material in the vomitus. Patient denies any stomach pain. Patient states has had normal bowel movements no significant constipation or diarrhea. Patient denies any dysuria, hematuria or significant nocturia. Patient denies any problems walking, swelling in the joints or loss of balance. Patient denies any skin changes, loss of hair or loss of weight. Patient denies any excessive worrying or anxiety or significant depression. Patient denies any  problems with insomnia. Patient denies excessive thirst, polyuria, polydipsia. Patient denies any swollen glands, patient denies easy bruising or easy bleeding. Patient denies any recent infections, allergies or URI. Patient "s visual fields have not changed significantly in recent time.   PHYSICAL EXAM: BP (!) 151/84 (BP Location: Left Arm)    Pulse 93    Temp 98.2 F (36.8 C) (Tympanic)    Wt 139 lb (63 kg)    BMI 23.13 kg/m  Range of motion of the head does elicit some pain.  Visual fields are within normal range.  No focal neurologic deficits are appreciated.  Range of motion of her lower extremities does not elicit pain.  Well-developed well-nourished patient in NAD. HEENT reveals PERLA, EOMI, discs not visualized.  Oral cavity is clear. No oral mucosal lesions are identified. Neck is clear without evidence of cervical or supraclavicular adenopathy. Lungs are clear to A&P. Cardiac examination is essentially unremarkable with regular rate and rhythm without murmur rub or thrill. Abdomen is benign with no organomegaly or masses noted. Motor sensory and DTR levels are equal and symmetric in the upper and lower extremities. Cranial nerves II through  XII are grossly intact. Proprioception is intact. No peripheral adenopathy or edema is identified. No motor or sensory levels are noted. Crude visual fields are within normal range.  LABORATORY DATA: Pathology report reviewed    RADIOLOGY RESULTS: PET CT scan reviewed   IMPRESSION: Multiple areas of probable plasmacytomas in 86 year old female for palliative radiation  PLAN: At this time elect to go ahead with 30 Gray in 10 fractions to her base of skull using the PET CT fusion study for delineation of my treatment fields.  I would also treat with a single fraction of 800 centigrade to her right hip again for palliation of her plasmacytoma based on her PET/CT findings.  Risks and benefits of treatment including possible sore throat possible fatigue and  skin reaction all were described in detail to the patient.  She seems to comprehend our treatment plans well.  I have set her up for CT simulation early next week.  I will follow after treatment for any possible areas of further palliation.  Patient and her daughter both seem to comprehend our treatment plan well.  I would like to take this opportunity to thank you for allowing me to participate in the care of your patient.Noreene Filbert, MD

## 2020-09-27 ENCOUNTER — Ambulatory Visit
Admission: RE | Admit: 2020-09-27 | Discharge: 2020-09-27 | Disposition: A | Discharge status: Home / Self Care | Payer: Medicare Other | Source: Ambulatory Visit | Attending: Radiation Oncology | Admitting: Radiation Oncology

## 2020-09-27 DIAGNOSIS — Z510 Encounter for antineoplastic radiation therapy: Secondary | ICD-10-CM | POA: Diagnosis present

## 2020-09-27 DIAGNOSIS — C9030 Solitary plasmacytoma not having achieved remission: Secondary | ICD-10-CM | POA: Diagnosis not present

## 2020-09-29 ENCOUNTER — Other Ambulatory Visit: Payer: Self-pay | Admitting: *Deleted

## 2020-09-29 DIAGNOSIS — C9030 Solitary plasmacytoma not having achieved remission: Secondary | ICD-10-CM

## 2020-10-02 DIAGNOSIS — Z510 Encounter for antineoplastic radiation therapy: Secondary | ICD-10-CM | POA: Diagnosis not present

## 2020-10-03 ENCOUNTER — Ambulatory Visit
Admission: RE | Admit: 2020-10-03 | Discharge: 2020-10-03 | Disposition: A | Discharge status: Home / Self Care | Payer: Medicare Other | Source: Ambulatory Visit | Attending: Radiation Oncology | Admitting: Radiation Oncology

## 2020-10-03 DIAGNOSIS — Z510 Encounter for antineoplastic radiation therapy: Secondary | ICD-10-CM | POA: Diagnosis not present

## 2020-10-04 ENCOUNTER — Ambulatory Visit: Admission: RE | Admit: 2020-10-04 | Payer: Medicare Other | Source: Ambulatory Visit

## 2020-10-04 DIAGNOSIS — Z510 Encounter for antineoplastic radiation therapy: Secondary | ICD-10-CM | POA: Diagnosis not present

## 2020-10-05 ENCOUNTER — Ambulatory Visit
Admission: RE | Admit: 2020-10-05 | Discharge: 2020-10-05 | Disposition: A | Discharge status: Home / Self Care | Payer: Medicare Other | Source: Ambulatory Visit | Attending: Radiation Oncology | Admitting: Radiation Oncology

## 2020-10-05 DIAGNOSIS — Z510 Encounter for antineoplastic radiation therapy: Secondary | ICD-10-CM | POA: Diagnosis not present

## 2020-10-06 ENCOUNTER — Ambulatory Visit: Payer: Medicare Other

## 2020-10-06 ENCOUNTER — Ambulatory Visit
Admission: RE | Admit: 2020-10-06 | Discharge: 2020-10-06 | Disposition: A | Discharge status: Home / Self Care | Payer: Medicare Other | Source: Ambulatory Visit | Attending: Radiation Oncology | Admitting: Radiation Oncology

## 2020-10-06 DIAGNOSIS — Z510 Encounter for antineoplastic radiation therapy: Secondary | ICD-10-CM | POA: Diagnosis not present

## 2020-10-09 ENCOUNTER — Ambulatory Visit: Payer: Medicare Other

## 2020-10-09 ENCOUNTER — Ambulatory Visit
Admission: RE | Admit: 2020-10-09 | Discharge: 2020-10-09 | Disposition: A | Discharge status: Home / Self Care | Payer: Medicare Other | Source: Ambulatory Visit | Attending: Radiation Oncology | Admitting: Radiation Oncology

## 2020-10-09 DIAGNOSIS — Z510 Encounter for antineoplastic radiation therapy: Secondary | ICD-10-CM | POA: Diagnosis not present

## 2020-10-10 ENCOUNTER — Ambulatory Visit: Payer: Medicare Other

## 2020-10-10 ENCOUNTER — Ambulatory Visit
Admission: RE | Admit: 2020-10-10 | Discharge: 2020-10-10 | Disposition: A | Discharge status: Home / Self Care | Payer: Medicare Other | Source: Ambulatory Visit | Attending: Radiation Oncology | Admitting: Radiation Oncology

## 2020-10-10 DIAGNOSIS — Z510 Encounter for antineoplastic radiation therapy: Secondary | ICD-10-CM | POA: Diagnosis not present

## 2020-10-11 ENCOUNTER — Ambulatory Visit: Payer: Medicare Other

## 2020-10-11 ENCOUNTER — Ambulatory Visit
Admission: RE | Admit: 2020-10-11 | Discharge: 2020-10-11 | Disposition: A | Discharge status: Home / Self Care | Payer: Medicare Other | Source: Ambulatory Visit | Attending: Radiation Oncology | Admitting: Radiation Oncology

## 2020-10-11 DIAGNOSIS — Z510 Encounter for antineoplastic radiation therapy: Secondary | ICD-10-CM | POA: Diagnosis not present

## 2020-10-12 ENCOUNTER — Inpatient Hospital Stay: Payer: Medicare Other | Attending: Oncology

## 2020-10-12 ENCOUNTER — Ambulatory Visit: Payer: Medicare Other

## 2020-10-12 ENCOUNTER — Ambulatory Visit
Admission: RE | Admit: 2020-10-12 | Discharge: 2020-10-12 | Disposition: A | Discharge status: Home / Self Care | Payer: Medicare Other | Source: Ambulatory Visit | Attending: Radiation Oncology | Admitting: Radiation Oncology

## 2020-10-12 ENCOUNTER — Other Ambulatory Visit: Payer: Self-pay

## 2020-10-12 DIAGNOSIS — C9030 Solitary plasmacytoma not having achieved remission: Secondary | ICD-10-CM | POA: Diagnosis not present

## 2020-10-12 DIAGNOSIS — Z510 Encounter for antineoplastic radiation therapy: Secondary | ICD-10-CM | POA: Diagnosis not present

## 2020-10-12 LAB — CBC
HCT: 37.7 % (ref 36.0–46.0)
Hemoglobin: 13 g/dL (ref 12.0–15.0)
MCH: 29.1 pg (ref 26.0–34.0)
MCHC: 34.5 g/dL (ref 30.0–36.0)
MCV: 84.3 fL (ref 80.0–100.0)
Platelets: 190 10*3/uL (ref 150–400)
RBC: 4.47 MIL/uL (ref 3.87–5.11)
RDW: 13.4 % (ref 11.5–15.5)
WBC: 6.2 10*3/uL (ref 4.0–10.5)
nRBC: 0 % (ref 0.0–0.2)

## 2020-10-13 ENCOUNTER — Ambulatory Visit
Admission: RE | Admit: 2020-10-13 | Discharge: 2020-10-13 | Disposition: A | Discharge status: Home / Self Care | Payer: Medicare Other | Source: Ambulatory Visit | Attending: Radiation Oncology | Admitting: Radiation Oncology

## 2020-10-13 ENCOUNTER — Ambulatory Visit: Payer: Medicare Other

## 2020-10-13 DIAGNOSIS — Z510 Encounter for antineoplastic radiation therapy: Secondary | ICD-10-CM | POA: Diagnosis not present

## 2020-10-16 ENCOUNTER — Ambulatory Visit
Admission: RE | Admit: 2020-10-16 | Discharge: 2020-10-16 | Disposition: A | Discharge status: Home / Self Care | Payer: Medicare Other | Source: Ambulatory Visit | Attending: Radiation Oncology | Admitting: Radiation Oncology

## 2020-10-16 ENCOUNTER — Ambulatory Visit: Payer: Medicare Other

## 2020-10-16 DIAGNOSIS — Z510 Encounter for antineoplastic radiation therapy: Secondary | ICD-10-CM | POA: Diagnosis not present

## 2020-10-17 ENCOUNTER — Ambulatory Visit: Payer: Medicare Other

## 2020-10-17 ENCOUNTER — Ambulatory Visit
Admission: RE | Admit: 2020-10-17 | Discharge: 2020-10-17 | Disposition: A | Discharge status: Home / Self Care | Payer: Medicare Other | Source: Ambulatory Visit | Attending: Radiation Oncology | Admitting: Radiation Oncology

## 2020-10-17 DIAGNOSIS — Z510 Encounter for antineoplastic radiation therapy: Secondary | ICD-10-CM | POA: Diagnosis not present

## 2020-10-18 ENCOUNTER — Ambulatory Visit
Admission: RE | Admit: 2020-10-18 | Discharge: 2020-10-18 | Disposition: A | Discharge status: Home / Self Care | Payer: Medicare Other | Source: Ambulatory Visit | Attending: Radiation Oncology | Admitting: Radiation Oncology

## 2020-10-18 ENCOUNTER — Ambulatory Visit: Payer: Medicare Other

## 2020-10-18 DIAGNOSIS — Z510 Encounter for antineoplastic radiation therapy: Secondary | ICD-10-CM | POA: Diagnosis not present

## 2020-10-30 ENCOUNTER — Telehealth: Payer: Self-pay | Admitting: *Deleted

## 2020-10-30 NOTE — Telephone Encounter (Signed)
Daughter Katharine Look called stating she thought she was told that Dr Baruch Gouty and his nurse told her that nausea medicine would be sent in for patient and it has not been ordered. She reports that patient is having nausea every morning. Please advise.

## 2020-10-31 ENCOUNTER — Other Ambulatory Visit: Payer: Self-pay | Admitting: *Deleted

## 2020-10-31 MED ORDER — ONDANSETRON HCL 8 MG PO TABS: 8.0000 mg | ORAL_TABLET | Freq: Three times a day (TID) | ORAL | 0 refills | Status: DC | PRN

## 2020-11-08 ENCOUNTER — Telehealth: Payer: Self-pay | Admitting: *Deleted

## 2020-11-08 ENCOUNTER — Encounter (INDEPENDENT_AMBULATORY_CARE_PROVIDER_SITE_OTHER): Payer: Medicare Other | Admitting: Ophthalmology

## 2020-11-08 NOTE — Telephone Encounter (Signed)
Amy [name] called reporting that after radiation therapy was given that patient has slowly developed nausea and is "on edge" She states that she also thought that Dr Baruch Gouty was going to order a prescription after her radiation therapy was completed, but nothing has been sent. She requests a return call from Dr Olena Leatherwood team to discuss this [phone]

## 2020-11-08 NOTE — Telephone Encounter (Signed)
Attempted to call Katharine Look, no answer, voicemail left stating that zofran was called in to [facility] last week and daughter [name] was notified of the same on 11/9.

## 2020-11-08 NOTE — Telephone Encounter (Addendum)
Zofran was called in last week and her daughter was called and informed that rx was called in.

## 2020-11-12 ENCOUNTER — Other Ambulatory Visit: Payer: Self-pay

## 2020-11-12 ENCOUNTER — Emergency Department
Admission: EM | Admit: 2020-11-12 | Discharge: 2020-11-12 | Disposition: A | Discharge status: Home / Self Care | Payer: Medicare Other | Attending: Emergency Medicine | Admitting: Emergency Medicine

## 2020-11-12 ENCOUNTER — Encounter: Payer: Self-pay | Admitting: Emergency Medicine

## 2020-11-12 ENCOUNTER — Emergency Department: Payer: Medicare Other

## 2020-11-12 DIAGNOSIS — R110 Nausea: Secondary | ICD-10-CM | POA: Diagnosis present

## 2020-11-12 DIAGNOSIS — Z79899 Other long term (current) drug therapy: Secondary | ICD-10-CM | POA: Insufficient documentation

## 2020-11-12 DIAGNOSIS — I10 Essential (primary) hypertension: Secondary | ICD-10-CM | POA: Insufficient documentation

## 2020-11-12 DIAGNOSIS — U071 COVID-19: Secondary | ICD-10-CM | POA: Insufficient documentation

## 2020-11-12 DIAGNOSIS — Z7982 Long term (current) use of aspirin: Secondary | ICD-10-CM | POA: Diagnosis not present

## 2020-11-12 LAB — COMPREHENSIVE METABOLIC PANEL
ALT: 35 U/L (ref 0–44)
AST: 36 U/L (ref 15–41)
Albumin: 3.8 g/dL (ref 3.5–5.0)
Alkaline Phosphatase: 112 U/L (ref 38–126)
Anion gap: 15 (ref 5–15)
BUN: 24 mg/dL — ABNORMAL HIGH (ref 8–23)
CO2: 28 mmol/L (ref 22–32)
Calcium: 9.5 mg/dL (ref 8.9–10.3)
Chloride: 99 mmol/L (ref 98–111)
Creatinine, Ser: 1.4 mg/dL — ABNORMAL HIGH (ref 0.44–1.00)
GFR, Estimated: 36 mL/min — ABNORMAL LOW (ref >60)
Glucose, Bld: 140 mg/dL — ABNORMAL HIGH (ref 70–99)
Potassium: 3.6 mmol/L (ref 3.5–5.1)
Sodium: 142 mmol/L (ref 135–145)
Total Bilirubin: 1 mg/dL (ref 0.3–1.2)
Total Protein: 6.9 g/dL (ref 6.5–8.1)

## 2020-11-12 LAB — CBC
HCT: 46.9 % — ABNORMAL HIGH (ref 36.0–46.0)
Hemoglobin: 15.1 g/dL — ABNORMAL HIGH (ref 12.0–15.0)
MCH: 27.9 pg (ref 26.0–34.0)
MCHC: 32.2 g/dL (ref 30.0–36.0)
MCV: 86.5 fL (ref 80.0–100.0)
Platelets: 250 10*3/uL (ref 150–400)
RBC: 5.42 MIL/uL — ABNORMAL HIGH (ref 3.87–5.11)
RDW: 13.5 % (ref 11.5–15.5)
WBC: 6.1 10*3/uL (ref 4.0–10.5)
nRBC: 0 % (ref 0.0–0.2)

## 2020-11-12 LAB — LIPASE, BLOOD: Lipase: 29 U/L (ref 11–51)

## 2020-11-12 LAB — URINALYSIS, COMPLETE (UACMP) WITH MICROSCOPIC
Bilirubin Urine: NEGATIVE
Glucose, UA: NEGATIVE mg/dL
Hgb urine dipstick: NEGATIVE
Ketones, ur: 5 mg/dL — AB
Nitrite: NEGATIVE
Protein, ur: 30 mg/dL — AB
Specific Gravity, Urine: 1.019 (ref 1.005–1.030)
pH: 5 (ref 5.0–8.0)

## 2020-11-12 LAB — RESP PANEL BY RT-PCR (FLU A&B, COVID) ARPGX2
Influenza A by PCR: NEGATIVE
Influenza B by PCR: NEGATIVE
SARS Coronavirus 2 by RT PCR: POSITIVE — AB

## 2020-11-12 MED ORDER — PROMETHAZINE HCL 12.5 MG PO TABS: 12.5000 mg | ORAL_TABLET | Freq: Four times a day (QID) | ORAL | 0 refills | Status: DC | PRN

## 2020-11-12 MED ORDER — ONDANSETRON HCL 4 MG/2ML IJ SOLN
4.0000 mg | Freq: Once | INTRAMUSCULAR | Status: AC
  Administered 2020-11-12: 4 mg via INTRAVENOUS
  Filled 2020-11-12: qty 2

## 2020-11-12 MED ORDER — SODIUM CHLORIDE 0.9 % IV BOLUS
1000.0000 mL | Freq: Once | INTRAVENOUS | Status: AC
  Administered 2020-11-12: 1000 mL via INTRAVENOUS

## 2020-11-12 NOTE — ED Provider Notes (Signed)
[facility] Emergency Department Provider Note  ____________________________________________  Time seen: Approximately 3:47 PM  I have reviewed the triage vital signs and the nursing notes.   HISTORY  Chief Complaint Nausea    HPI Amy [name] is a 86 y.o. female who presents the emergency department complaining of nausea.  Patient states that for the past 4 to 5 weeks she has been nauseated.  She states that she is currently undergoing radiation therapy for cancer, had treatments "on the back of my brain" with radiation.  Since then patient has been nauseated.  She is not been having any emesis but does have decreased appetite.  She states that she has not eaten or drinking today but did have dinner last night.  Patient has had some mild diarrhea but no constipation.  She does have an hiatal hernia.  Patient denies any fevers or chills, nasal congestion, sore throat.  She does have cough but no shortness of breath.  Nausea but no emesis.  One episode of diarrhea but no constipation.  Patient does have a close contact with her husband who was just diagnosed with COVID-19.   Patient has had no abdominal pain.  No dysuria, polyuria, hematuria.        Past Medical History:  Diagnosis Date  . Arthritis   . Depression   . Diverticulitis   . DVT (deep venous thrombosis) (HCC)    Right leg   . Dyspnea   . Family history of adverse reaction to anesthesia    PONV sister  . GERD (gastroesophageal reflux disease)   . History of hiatal hernia   . Hyperlipidemia   . Hypertension   . Mitral valve regurgitation   . Neuromuscular disorder ([place])   . 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 Pneumonia   . Renal disorder     Patient Active Problem List   Diagnosis Date Noted  . Goals of care, counseling/discussion 08/04/2020  . Neck pain 08/04/2020  . Abnormal MRI, neck 08/04/2020  . History of plasmacytoma 08/04/2020  . Orthostatic hypotension 09/13/2016  . Dehydration  09/13/2016  . Trismus 09/12/2016  . Syncope 09/11/2016  . Poor appetite 09/11/2016  . Atherosclerosis of native coronary artery of native heart without angina pectoris 05/13/2016  . Hiatal hernia without gangrene and obstruction 12/01/2015  . Herpes zona 11/25/2015  . Hx of deep vein thrombophlebitis of lower extremity 11/25/2015  . Senile ecchymosis 11/14/2015  . Mixed hyperlipidemia 11/14/2015  . Acute kidney failure ([place]) 11/09/2015  . Arthritis, degenerative 11/09/2015  . Cervical spinal cord compression ([place]) 11/09/2015  . Osteoporosis, post-menopausal 11/09/2015  . Female genuine stress incontinence 11/09/2015  . Polyneuropathy 09/30/2014  . Benign essential HTN 06/08/2014  . MI (mitral incompetence) 06/08/2014  . 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 ABDOMINAL HYSTERECTOMY     partial  . ABDOMINAL SURGERY    . bladder tack  1990  . BREAST BIOPSY  1974  . CATARACT EXTRACTION, BILATERAL    . CERVICAL SPINE SURGERY     tumor removed  . HERNIA REPAIR    . KYPHOPLASTY N/A 03/12/2018   Procedure: [id number];  Surgeon: Hessie Knows, MD;  Location: [facility] ORS;  Service: Orthopedics;  Laterality: N/A;  . KYPHOPLASTY N/A 06/30/2018   Procedure: Marcos Eke;  Surgeon: Hessie Knows, MD;  Location: [facility] ORS;  Service: Orthopedics;  Laterality: N/A;  . plasmacytoma resection  2005   lumbar    Prior to Admission medications  Medication Sig Start Date End Date Taking? Authorizing Provider  acetaminophen-codeine (TYLENOL #3) 300-30 MG tablet Take 1 tablet by mouth every 12 (twelve) hours as needed for severe pain. 09/15/20   Earlie Server, MD  alendronate (FOSAMAX) 70 MG tablet Take 70 mg by mouth once a week. Sunday 06/01/18   [provider]  aspirin EC 81 MG tablet Take 1 tablet by mouth daily.    [provider]  cholecalciferol (VITAMIN D) 400 units TABS tablet Take 400 Units by mouth daily.    [provider]  cyclobenzaprine (FLEXERIL) 5 MG tablet Take 5-10 mg by mouth 3 (three) times daily as needed for muscle spasms.    [provider]  diphenhydrAMINE (BENADRYL) 25 MG tablet Take 25 mg by mouth at bedtime.    [provider]  DULoxetine (CYMBALTA) 60 MG capsule Take 60 mg by mouth daily.    [provider]  esomeprazole (NEXIUM) 40 MG capsule Take by mouth. 07/11/20 07/11/21  [provider]  losartan (COZAAR) 50 MG tablet Take 25 mg by mouth daily.     [provider]  Melatonin 10 MG CAPS Take by mouth at bedtime as needed.    [provider]  Multiple Vitamin (MULTIVITAMIN WITH MINERALS) TABS tablet Take 1 tablet by mouth daily.    [provider]  Multiple Vitamins-Minerals (PRESERVISION AREDS PO) Take by mouth.    [provider]  Omega-3 Fatty Acids (FISH OIL) 1000 MG CAPS Take 2 capsules by mouth daily.     [provider]  ondansetron (ZOFRAN) 8 MG tablet Take 1 tablet (8 mg total) by mouth every 8 (eight) hours as needed for nausea or vomiting. 10/31/20   Noreene Filbert, MD  Polyvinyl Alcohol-Povidone (REFRESH OP) Apply 1-2 drops to eye as needed (dry eyes).     [provider]  promethazine (PHENERGAN) 12.5 MG tablet Take 1 tablet (12.5 mg total) by mouth every 6 (six) hours as needed for nausea or vomiting. 11/12/20   Dionisio Aragones, Charline Bills, PA-C  triamterene-hydrochlorothiazide (DYAZIDE) 37.5-25 MG capsule Take 1 capsule by mouth daily.    [provider]    Allergies Doxycycline hyclate, Levofloxacin, and Sulfa antibiotics  Family History  Problem Relation Age of Onset  . Heart failure Mother   . Heart failure Father   . COPD Sister   . Prostate cancer Brother   . COPD Brother     Social History Social History   Tobacco Use  . Smoking status: Never Smoker  . Smokeless tobacco: Never Used  Vaping Use  . Vaping Use: Never used  Substance Use Topics  . Alcohol  use: No    Alcohol/week: 0.0 standard drinks  . Drug use: No     Review of Systems  Constitutional: No fever/chills Eyes: No visual changes. No discharge ENT: No upper respiratory complaints. Cardiovascular: no chest pain. Respiratory: no cough. No SOB. Gastrointestinal: No abdominal pain.  Positive nausea, no vomiting.  Single episode of diarrhea.  No constipation. Genitourinary: Negative for dysuria. No hematuria Musculoskeletal: Negative for musculoskeletal pain. Skin: Negative for rash, abrasions, lacerations, ecchymosis. Neurological: Negative for headaches, focal weakness or numbness.  10 System ROS otherwise negative.  ____________________________________________   PHYSICAL EXAM:  VITAL SIGNS: ED Triage Vitals [11/12/20 1215]  Enc Vitals Group     BP 130/71     Pulse Rate 96     Resp 16     Temp 98.1 F (36.7 C)     Temp  Source Oral     SpO2 94 %     Weight 138 lb (62.6 kg)     Height 5\' 5"  (1.651 m)     Head Circumference      Peak Flow      Pain Score 0     Pain Loc      Pain Edu?      Excl. in [place]?      Constitutional: Alert and oriented. Well appearing and in no acute distress. Eyes: Conjunctivae are normal. PERRL. EOMI. Head: Atraumatic. ENT:      Ears: EACs and TMs unremarkable bilaterally.      Nose: No congestion/rhinnorhea.      Mouth/Throat: Mucous membranes are moist.  Oropharynx is nonerythematous and nonedematous.  Uvula is midline. Neck: No stridor.  Hematological/Lymphatic/Immunilogical: No cervical lymphadenopathy. Cardiovascular: Normal rate, regular rhythm. Normal S1 and S2.  Good peripheral circulation. Respiratory: Normal respiratory effort without tachypnea or retractions. Lungs CTAB. Good air entry to the bases with no decreased or absent breath sounds. Gastrointestinal: Bowel sounds 4 quadrants.  No external abdominal wall findings.  Soft and nontender to palpation all quadrants.. No guarding or rigidity. No palpable masses. No  distention. No CVA tenderness. Musculoskeletal: Full range of motion to all extremities. No gross deformities appreciated. Neurologic:  Normal speech and language. No gross focal neurologic deficits are appreciated.  Cranial nerves II to XII grossly intact. Skin:  Skin is warm, dry and intact. No rash noted. Psychiatric: Mood and affect are normal. Speech and behavior are normal. Patient exhibits appropriate insight and judgement.   ____________________________________________   LABS (all labs ordered are listed, but only abnormal results are displayed)  Labs Reviewed  RESP PANEL BY RT-PCR (FLU A&B, COVID) ARPGX2 - Abnormal; Notable for the following components:      Result Value   SARS Coronavirus 2 by RT PCR POSITIVE (*)    All other components within normal limits  COMPREHENSIVE METABOLIC PANEL - Abnormal; Notable for the following components:   Glucose, Bld 140 (*)    BUN 24 (*)    Creatinine, Ser 1.40 (*)    GFR, Estimated 36 (*)    All other components within normal limits  CBC - Abnormal; Notable for the following components:   RBC 5.42 (*)    Hemoglobin 15.1 (*)    HCT 46.9 (*)    All other components within normal limits  LIPASE, BLOOD  URINALYSIS, COMPLETE (UACMP) WITH MICROSCOPIC   ____________________________________________  EKG   ____________________________________________  RADIOLOGY I personally viewed and evaluated these images as part of my medical decision making, as well as reviewing the written report by the radiologist.  ED Provider Interpretation: Large hiatal hernia, otherwise no acute cardiopulmonary abnormality.  DG Chest 2 View  Result Date: 11/12/2020 CLINICAL DATA:  Cough, nausea EXAM: CHEST - 2 VIEW COMPARISON:  12/17/2016 FINDINGS: Heart is normal size. Lungs clear. No effusions or edema. Aortic atherosclerosis. Degenerative changes and changes of prior vertebroplasty in the lower thoracic spine. Large hiatal hernia. IMPRESSION: Hiatal  hernia. Aortic atherosclerosis. No active disease. Electronically Signed   By: Rolm Baptise M.D.   On: 11/12/2020 16:48    ____________________________________________    PROCEDURES  Procedure(s) performed:    Procedures    Medications  sodium chloride 0.9 % bolus 1,000 mL (1,000 mLs Intravenous New Bag/Given 11/12/20 1604)  ondansetron (ZOFRAN) injection 4 mg (4 mg Intravenous Given 11/12/20 1606)     ____________________________________________   INITIAL IMPRESSION / ASSESSMENT AND PLAN /  ED COURSE  Pertinent labs & imaging results that were available during my care of the patient were reviewed by me and considered in my medical decision making (see chart for details).  Review of the [place] CSRS was performed in accordance of the [place] prior to dispensing any controlled drugs.           Patient's diagnosis is consistent with COVID-19, nausea.  Patient presented to the emergency department with primary complaint of ongoing nausea.  She is had no emesis, one episode of diarrhea.  Overall patient's exam was reassuring.  No adventitious lung sounds.  No tenderness on abdominal exam.  Patient is a cancer patient, has undergone radiation treatments including radiation to the brain in the past month.  Differential included underlying infection, obstruction, side effects of chemo and radiation, COVID-19.  Patient's husband was diagnosed with COVID-19 today.  Overall labs are reassuring, imaging was reassuring, exam is reassuring but patient is positive for Covid.  Patient was given fluids and Zofran here in the emergency department.  Patient states that she feels much improved.  She states that she feels like she can go home.  At this time patient has no respiratory distress, labs are stable, patient appears stable.  I given strict return precautions.  Patient is just requesting medication for her nausea and I will prescribe Phenergan for same.  I have cautioned that Phenergan can make her  little drowsy and she states that she will bear this in mind when using the medication.  Given strict return precautions for high fevers, difficulty breathing, inability to keep foods or liquids down..  Follow-up with primary care as needed.  Patient is given ED precautions to return to the ED for any worsening or new symptoms.     ____________________________________________  FINAL CLINICAL IMPRESSION(S) / ED DIAGNOSES  Final diagnoses:  COVID-19  Nausea      NEW MEDICATIONS STARTED DURING THIS VISIT:  ED Discharge Orders         Ordered    promethazine (PHENERGAN) 12.5 MG tablet  Every 6 hours PRN        11/12/20 1750              This chart was dictated using voice recognition software/Dragon. Despite best efforts to proofread, errors can occur which can change the meaning. Any change was purely unintentional.    Darletta Moll, PA-C 11/12/20 [place], [place], MD 11/12/20 1827

## 2020-11-12 NOTE — ED Notes (Signed)
Pt provided with another warm blanket by this RN. Pt repeatedly asking this RN "how much longer, how much longer until I go back?" Pt visualized in  NAD in lobby, pt states "I'm just so sick I can't hardly stand it".

## 2020-11-12 NOTE — ED Triage Notes (Signed)
First RN Note: Pt to ED via [facility] with c/o generalized weakness x 1 month. Per EMS pt with hx of cancer, EMS reports per family "cancer is all over", reports 10 out of 11 treatments focused on posterior head, most recently treatment focused on R hip, since treatment pt has had abdominal pain, decreased appetite.    90HR 136/82 96% RA

## 2020-11-12 NOTE — ED Triage Notes (Signed)
Pt to ED via [facility] from home for nausea. Pt states that she had "radiation on her head over a month ago" and thinks that this is what caused her nausea. Pt denies any other symptoms at this time. Pt asking for drink in triage. Pt informed need to wait to see MD first.

## 2020-11-14 ENCOUNTER — Telehealth: Payer: Self-pay | Admitting: Adult Health

## 2020-11-14 NOTE — Telephone Encounter (Signed)
Spoke with patients daughter regarding monoclonal antibody treatment for COVID 19 given to those who are at risk for complications and/or hospitalization of the virus.  Patient meets criteria based on: age.  She is going to check with [facility] about the treatment and will call us back if she needs a treatment with [place].    Call back number given: [phone]  Wilber Bihari, NP

## 2020-11-20 ENCOUNTER — Ambulatory Visit: Payer: Medicare Other | Attending: Radiation Oncology | Admitting: Radiation Oncology

## 2020-12-11 ENCOUNTER — Other Ambulatory Visit: Payer: Medicare Other

## 2020-12-18 ENCOUNTER — Ambulatory Visit: Payer: Medicare Other | Admitting: Oncology

## 2020-12-29 ENCOUNTER — Other Ambulatory Visit: Payer: Medicare Other

## 2021-01-01 DIAGNOSIS — M818 Other osteoporosis without current pathological fracture: Secondary | ICD-10-CM | POA: Insufficient documentation

## 2021-01-05 ENCOUNTER — Ambulatory Visit: Payer: Medicare Other | Admitting: Oncology

## 2021-04-17 ENCOUNTER — Emergency Department
Admission: EM | Admit: 2021-04-17 | Discharge: 2021-04-18 | Disposition: A | Discharge status: Home / Self Care | Payer: Medicare Other | Attending: Emergency Medicine | Admitting: Emergency Medicine

## 2021-04-17 DIAGNOSIS — M7989 Other specified soft tissue disorders: Secondary | ICD-10-CM | POA: Diagnosis not present

## 2021-04-17 DIAGNOSIS — M5416 Radiculopathy, lumbar region: Secondary | ICD-10-CM | POA: Diagnosis not present

## 2021-04-17 DIAGNOSIS — Z23 Encounter for immunization: Secondary | ICD-10-CM | POA: Insufficient documentation

## 2021-04-17 DIAGNOSIS — I10 Essential (primary) hypertension: Secondary | ICD-10-CM | POA: Diagnosis not present

## 2021-04-17 DIAGNOSIS — W19XXXA Unspecified fall, initial encounter: Secondary | ICD-10-CM | POA: Diagnosis not present

## 2021-04-17 DIAGNOSIS — B9689 Other specified bacterial agents as the cause of diseases classified elsewhere: Secondary | ICD-10-CM | POA: Insufficient documentation

## 2021-04-17 DIAGNOSIS — S51811A Laceration without foreign body of right forearm, initial encounter: Secondary | ICD-10-CM | POA: Diagnosis not present

## 2021-04-17 DIAGNOSIS — Z7982 Long term (current) use of aspirin: Secondary | ICD-10-CM | POA: Insufficient documentation

## 2021-04-17 DIAGNOSIS — M79661 Pain in right lower leg: Secondary | ICD-10-CM | POA: Diagnosis not present

## 2021-04-17 DIAGNOSIS — M21371 Foot drop, right foot: Secondary | ICD-10-CM | POA: Diagnosis not present

## 2021-04-17 DIAGNOSIS — S0181XA Laceration without foreign body of other part of head, initial encounter: Secondary | ICD-10-CM | POA: Insufficient documentation

## 2021-04-17 DIAGNOSIS — N390 Urinary tract infection, site not specified: Secondary | ICD-10-CM | POA: Insufficient documentation

## 2021-04-17 DIAGNOSIS — S59911A Unspecified injury of right forearm, initial encounter: Secondary | ICD-10-CM | POA: Diagnosis present

## 2021-04-17 DIAGNOSIS — S0990XA Unspecified injury of head, initial encounter: Secondary | ICD-10-CM

## 2021-04-17 DIAGNOSIS — S41111A Laceration without foreign body of right upper arm, initial encounter: Secondary | ICD-10-CM

## 2021-04-17 DIAGNOSIS — I2510 Atherosclerotic heart disease of native coronary artery without angina pectoris: Secondary | ICD-10-CM | POA: Insufficient documentation

## 2021-04-17 NOTE — ED Triage Notes (Addendum)
Pt presents from home with a cc of fall just PTA. Pt reports that her right leg "just gave out". Pt has large skin tear to R elbow and R ear. Swelling to RLE. Pt reports this was previously present. Tachy 130s with medic. CBG 199. No hx DM. Pt reports hitting R side of head. No LOC. Not on anticoagulants.

## 2021-04-18 ENCOUNTER — Emergency Department: Payer: Medicare Other

## 2021-04-18 ENCOUNTER — Other Ambulatory Visit: Payer: Self-pay

## 2021-04-18 DIAGNOSIS — S51811A Laceration without foreign body of right forearm, initial encounter: Secondary | ICD-10-CM | POA: Diagnosis not present

## 2021-04-18 LAB — BASIC METABOLIC PANEL
Anion gap: 11 (ref 5–15)
BUN: 29 mg/dL — ABNORMAL HIGH (ref 8–23)
CO2: 27 mmol/L (ref 22–32)
Calcium: 9.3 mg/dL (ref 8.9–10.3)
Chloride: 102 mmol/L (ref 98–111)
Creatinine, Ser: 0.76 mg/dL (ref 0.44–1.00)
GFR, Estimated: >60 mL/min (ref >60)
Glucose, Bld: 154 mg/dL — ABNORMAL HIGH (ref 70–99)
Potassium: 4.4 mmol/L (ref 3.5–5.1)
Sodium: 140 mmol/L (ref 135–145)

## 2021-04-18 LAB — CBC WITH DIFFERENTIAL/PLATELET
Abs Immature Granulocytes: 0.02 10*3/uL (ref 0.00–0.07)
Basophils Absolute: 0 10*3/uL (ref 0.0–0.1)
Basophils Relative: 0 %
Eosinophils Absolute: 0.2 10*3/uL (ref 0.0–0.5)
Eosinophils Relative: 2 %
HCT: 41.1 % (ref 36.0–46.0)
Hemoglobin: 13.4 g/dL (ref 12.0–15.0)
Immature Granulocytes: 0 %
Lymphocytes Relative: 36 %
Lymphs Abs: 2.6 10*3/uL (ref 0.7–4.0)
MCH: 28.7 pg (ref 26.0–34.0)
MCHC: 32.6 g/dL (ref 30.0–36.0)
MCV: 88 fL (ref 80.0–100.0)
Monocytes Absolute: 0.4 10*3/uL (ref 0.1–1.0)
Monocytes Relative: 6 %
Neutro Abs: 3.9 10*3/uL (ref 1.7–7.7)
Neutrophils Relative %: 56 %
Platelets: 217 10*3/uL (ref 150–400)
RBC: 4.67 MIL/uL (ref 3.87–5.11)
RDW: 14.4 % (ref 11.5–15.5)
WBC: 7.1 10*3/uL (ref 4.0–10.5)
nRBC: 0 % (ref 0.0–0.2)

## 2021-04-18 LAB — URINALYSIS, ROUTINE W REFLEX MICROSCOPIC
Bilirubin Urine: NEGATIVE
Glucose, UA: NEGATIVE mg/dL
Hgb urine dipstick: NEGATIVE
Ketones, ur: 5 mg/dL — AB
Nitrite: POSITIVE — AB
Protein, ur: NEGATIVE mg/dL
Specific Gravity, Urine: 1.021 (ref 1.005–1.030)
pH: 5 (ref 5.0–8.0)

## 2021-04-18 MED ORDER — SODIUM CHLORIDE 0.9 % IV SOLN
1.0000 g | Freq: Once | INTRAVENOUS | Status: AC
  Administered 2021-04-18: 1 g via INTRAVENOUS
  Filled 2021-04-18: qty 10

## 2021-04-18 MED ORDER — LIDOCAINE-EPINEPHRINE 2 %-1:100000 IJ SOLN
20.0000 mL | Freq: Once | INTRAMUSCULAR | Status: AC
  Administered 2021-04-18: 20 mL via INTRADERMAL
  Filled 2021-04-18: qty 1

## 2021-04-18 MED ORDER — BACITRACIN ZINC 500 UNIT/GM EX OINT
TOPICAL_OINTMENT | Freq: Once | CUTANEOUS | Status: AC
  Filled 2021-04-18: qty 1.8

## 2021-04-18 MED ORDER — CEPHALEXIN 500 MG PO CAPS: 500.0000 mg | ORAL_CAPSULE | Freq: Two times a day (BID) | ORAL | 0 refills | Status: DC

## 2021-04-18 MED ORDER — TETANUS-DIPHTH-ACELL PERTUSSIS 5-2.5-18.5 LF-MCG/0.5 IM SUSY
0.5000 mL | PREFILLED_SYRINGE | Freq: Once | INTRAMUSCULAR | Status: AC
  Administered 2021-04-18: 0.5 mL via INTRAMUSCULAR
  Filled 2021-04-18: qty 0.5

## 2021-04-18 NOTE — ED Notes (Signed)
PT to bedside

## 2021-04-18 NOTE — ED Provider Notes (Signed)
-----------------------------------------   10:46 AM on 04/18/2021 -----------------------------------------  Patient care assumed from Dr. Leonides Schanz.  Dr. Cari Caraway of neurosurgery has seen the patient in the room.  Does not believe surgery would be of benefit to the patient at this point.  Did recommend an AFO orthotic given the patient's foot drop.  Also recommended PT/OT.  We will have social work and PT evaluate the patient in the emergency department to help with home resources as the patient is adamant about being discharged home.  Patient does have a urinary tract infection and is being given antibiotics.  Daughter is on her way, we will discuss with daughter when she arrives.  I have spoken to social work and have placed PT and OT home health orders for the patient.  I have spoken to the brace technician who is in route to the hospital to provide a right lower extremity AFO for the patient.  After the patient has the brace she can be discharged home with antibiotics for UTI.   Harvest Dark, MD 04/18/21 1451

## 2021-04-18 NOTE — Consult Note (Signed)
Referring Physician:  No referring provider defined for this encounter.  Primary Physician:  [name], MD  Chief Complaint:  R foot drop, fall  History of Present Illness: 04/18/2021 Amy [name] is a 87 y.o. female who presents with the chief complaint of fall.  She had a fall from sitting on the bed.  She has weakness in her RLE for 3 weeks or so.  She does have some pain and numbness in her lower leg.  She denies hip area pain.  She has no bowel or bladder changes.  She is not interested in surgical intervention, or in admission.  She lives at home with her daughter.    Review of Systems:  A 10 point review of systems is negative, except for the pertinent positives and negatives detailed in the HPI.  Past Medical History: Past Medical History:  Diagnosis Date  . Arthritis   . Depression   . Diverticulitis   . DVT (deep venous thrombosis) (HCC)    Right leg   . Dyspnea   . Family history of adverse reaction to anesthesia    PONV sister  . GERD (gastroesophageal reflux disease)   . History of hiatal hernia   . Hyperlipidemia   . Hypertension   . Mitral valve regurgitation   . Neuromuscular disorder ([place])   . 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 Pneumonia   . Renal disorder     Past Surgical History: Past Surgical History:  Procedure Laterality Date  . ABDOMINAL HYSTERECTOMY     partial  . ABDOMINAL SURGERY    . bladder tack  1990  . BREAST BIOPSY  1974  . CATARACT EXTRACTION, BILATERAL    . CERVICAL SPINE SURGERY     tumor removed  . HERNIA REPAIR    . KYPHOPLASTY N/A 03/12/2018   Procedure: [id number];  Surgeon: Hessie Knows, MD;  Location: [facility] ORS;  Service: Orthopedics;  Laterality: N/A;  . KYPHOPLASTY N/A 06/30/2018   Procedure: Marcos Eke;  Surgeon: Hessie Knows, MD;  Location: [facility] ORS;  Service: Orthopedics;  Laterality: N/A;  . plasmacytoma resection  2005   lumbar    Allergies: Allergies as of 04/17/2021 - Review Complete 11/12/2020   Allergen Reaction Noted  . Doxycycline hyclate Nausea Only 11/09/2015  . Levofloxacin Nausea And Vomiting 01/08/2016  . Sulfa antibiotics Rash 11/09/2015    Medications: No current facility-administered medications for this encounter.  Current Outpatient Medications:  .  cephALEXin (KEFLEX) 500 MG capsule, Take 1 capsule (500 mg total) by mouth 2 (two) times daily., Disp: 14 capsule, Rfl: 0 .  acetaminophen-codeine (TYLENOL #3) 300-30 MG tablet, Take 1 tablet by mouth every 12 (twelve) hours as needed for severe pain., Disp: 30 tablet, Rfl: 0 .  alendronate (FOSAMAX) 70 MG tablet, Take 70 mg by mouth once a week. Sunday, Disp: , Rfl:  .  aspirin EC 81 MG tablet, Take 1 tablet by mouth daily., Disp: , Rfl:  .  cholecalciferol (VITAMIN D) 400 units TABS tablet, Take 400 Units by mouth daily., Disp: , Rfl:  .  cyclobenzaprine (FLEXERIL) 5 MG tablet, Take 5-10 mg by mouth 3 (three) times daily as needed for muscle spasms., Disp: , Rfl:  .  diphenhydrAMINE (BENADRYL) 25 MG tablet, Take 25 mg by mouth at bedtime., Disp: , Rfl:  .  DULoxetine (CYMBALTA) 60 MG capsule, Take 60 mg by mouth daily., Disp: , Rfl:  .  esomeprazole (NEXIUM) 40 MG capsule, Take by mouth., Disp: , Rfl:  .  losartan (COZAAR) 50 MG tablet, Take 25 mg by mouth daily. , Disp: , Rfl:  .  Melatonin 10 MG CAPS, Take by mouth at bedtime as needed., Disp: , Rfl:  .  Multiple Vitamin (MULTIVITAMIN WITH MINERALS) TABS tablet, Take 1 tablet by mouth daily., Disp: , Rfl:  .  Multiple Vitamins-Minerals (PRESERVISION AREDS PO), Take by mouth., Disp: , Rfl:  .  Omega-3 Fatty Acids (FISH OIL) 1000 MG CAPS, Take 2 capsules by mouth daily. , Disp: , Rfl:  .  ondansetron (ZOFRAN) 8 MG tablet, Take 1 tablet (8 mg total) by mouth every 8 (eight) hours as needed for nausea or vomiting., Disp: 30 tablet, Rfl: 0 .  Polyvinyl Alcohol-Povidone (REFRESH OP), Apply 1-2 drops to eye as needed (dry eyes). , Disp: , Rfl:  .  promethazine (PHENERGAN)  12.5 MG tablet, Take 1 tablet (12.5 mg total) by mouth every 6 (six) hours as needed for nausea or vomiting., Disp: 20 tablet, Rfl: 0 .  triamterene-hydrochlorothiazide (DYAZIDE) 37.5-25 MG capsule, Take 1 capsule by mouth daily., Disp: , Rfl:    Social History: Social History   Tobacco Use  . Smoking status: Never Smoker  . Smokeless tobacco: Never Used  Vaping Use  . Vaping Use: Never used  Substance Use Topics  . Alcohol use: No    Alcohol/week: 0.0 standard drinks  . Drug use: No    Family Medical History: Family History  Problem Relation Age of Onset  . Heart failure Mother   . Heart failure Father   . COPD Sister   . Prostate cancer Brother   . COPD Brother     Physical Examination: Vitals:   04/18/21 0600 04/18/21 0900  BP: (!) 121/98 (!) 136/97  Pulse: 93 87  Resp: 15 18  Temp:    SpO2: 96% 96%     General: Patient is well developed, well nourished, calm, collected, and in no apparent distress.  Psychiatric: Patient is non-anxious.  Head:  Pupils equal, round, and reactive to light.  ENT:  Oral mucosa appears well hydrated.  Neck:   Supple.  Full range of motion.  Respiratory: Patient is breathing without any difficulty.  Extremities: No edema.  Vascular: Palpable pulses in dorsal pedal vessels.  Skin:   On exposed skin, there are no abnormal skin lesions.  NEUROLOGICAL:  General: In no acute distress.   Awake, alert, oriented to person, place, and time.  Pupils equal round and reactive to light.  Facial tone is symmetric.  Tongue protrusion is midline.  There is no pronator drift.  ROM of spine: untested.     Strength: Side Biceps Triceps Deltoid Interossei Grip Wrist Ext. Wrist Flex.  R 5 5 5 5 5 5 5   L 5 5 5 5 5 5 5    Side Iliopsoas Quads Hamstring PF DF EHL  R 5 5 5 5 1 1   L 5 5 5 5 5 5     She has no inversion or eversion of RLE at the ankle.    Bilateral upper and lower extremity sensation is intact to light touch except R L4 and  L5 distributions, which are diminished. Reflexes are 1+ and symmetric at the biceps, triceps, brachioradialis, patella and achilles. Hoffman's is absent.  Clonus is not present.  Toes are down-going.    Gait is untested.  Imaging: MRI L spine 04/18/21 IMPRESSION: 1. No acute fracture or other abnormality within the lumbar spine. 2. Broad central to right foraminal disc protrusion at L4-5, closely  approximating and potentially irritating either the exiting right L4 or descending L5 nerve roots. Finding could contribute to right lower extremity symptoms. 3. Small left foraminal disc protrusion at L5-S1, potentially affecting the exiting left L5 nerve root. 4. Chronic facet mediated 6 mm anterolisthesis of L4 on L5 with associated moderate right worse than left lateral recess narrowing. 5. Chronic T11 and T12 compression fractures with sequelae of prior vertebral augmentation.   Electronically Signed   By: Jeannine Boga M.D.   On: 04/18/2021 02:01  I have personally reviewed the images and agree with the above interpretation.  Labs: CBC Latest Ref Rng & Units 04/18/2021 11/12/2020 10/12/2020  WBC 4.0 - 10.5 K/uL 7.1 6.1 6.2  Hemoglobin 12.0 - 15.0 g/dL 13.4 15.1(H) 13.0  Hematocrit 36.0 - 46.0 % 41.1 46.9(H) 37.7  Platelets 150 - 400 K/uL 217 250 190       Assessment and Plan: Ms. Abrell is a pleasant 87 y.o. female with right foot drop, likely due to L4-5 radiculopathies.  She has had weakness for 3 weeks.  After the first 48 hours, there is no significant improvement in motor outcomes with surgical intervention.  Thus, I would not recommend acute surgical intervention.  She may benefit from physical therapy, and potentially from an AFO.  She is not interested in being admitted to the hospital. I will defer evalaluation for safe discharge to the ER.  I will arrange follow up for her.    Terran Klinke K. Izora Ribas MD, [place] [facility] of Neuro[facility]

## 2021-04-18 NOTE — TOC Initial Note (Signed)
Transition of Care [facility]) - Initial/Assessment Note    Patient Details  Name: Amy [name] MRN: [id number] Date of Birth: 06/12/1929  Transition of Care [facility]) CM/SW Contact:    Ova Freshwater Phone Number: [phone] 04/18/2021, 2:49 PM  Clinical Narrative:                  Patient presents to [facility] ED due to right leg weakness and fall.  Patient was seen by neurosurgery and was ordered a boot for her leg.  Patient lives with daughter Ginger Carne (Daughter)  [phone] [facility] Phone) who is the patient's primary care giver.  Patient will d/c home with PT/OT home health.  Corene Cornea w/ [place] spoke with patient and daughter.   Barriers to Discharge: No Barriers Identified   Patient Goals and CMS Choice Patient states their goals for this hospitalization and ongoing recovery are:: get better      Expected Discharge Plan and Services                                                Prior Living Arrangements/Services                       Activities of Daily Living      Permission Sought/Granted                  Emotional Assessment              Admission diagnosis:  Fall Patient Active Problem List   Diagnosis Date Noted  . Goals of care, counseling/discussion 08/04/2020  . Neck pain 08/04/2020  . Abnormal MRI, neck 08/04/2020  . History of plasmacytoma 08/04/2020  . Orthostatic hypotension 09/13/2016  . Dehydration 09/13/2016  . Trismus 09/12/2016  . Syncope 09/11/2016  . Poor appetite 09/11/2016  . Atherosclerosis of native coronary artery of native heart without angina pectoris 05/13/2016  . Hiatal hernia without gangrene and obstruction 12/01/2015  . Herpes zona 11/25/2015  . Hx of deep vein thrombophlebitis of lower extremity 11/25/2015  . Senile ecchymosis 11/14/2015  . Mixed hyperlipidemia 11/14/2015  . Acute kidney failure ([place]) 11/09/2015  . Arthritis, degenerative 11/09/2015  . Cervical spinal cord  compression ([place]) 11/09/2015  . Osteoporosis, post-menopausal 11/09/2015  . Female genuine stress incontinence 11/09/2015  . Polyneuropathy 09/30/2014  . Benign essential HTN 06/08/2014  . MI (mitral incompetence) 06/08/2014  . 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[place]) 05/28/2013   PCP:  [name], MD Pharmacy:   [place] [place] [address] Phone: [phone] Fax: [phone]  [facility] [address] [facility] [address] [place] [address] Phone: [phone] Fax: [place] [place] [place] [address] Phone: [phone] Fax: [phone]     Social Determinants of Health (SDOH) Interventions    Readmission Risk Interventions No flowsheet data found.

## 2021-04-18 NOTE — ED Notes (Signed)
Repot to [place], [place]

## 2021-04-18 NOTE — ED Provider Notes (Signed)
[facility] Emergency Department Provider Note ____________________________________________   Event Date/Time   First MD Initiated Contact with Patient 04/17/21 2354     (approximate)  I have reviewed the triage vital signs and the nursing notes.   HISTORY  Chief Complaint Fall    HPI Amy [name] is a 87 y.o. female with history of hypertension, hyperlipidemia, right lower extremity DVT who presents to the emergency department with EMS after a fall.  Patient reports that she lives with her daughter and gets around using a walker.  States she was sitting down on her bed when her right foot gave away causing her to fall onto the ground.  She did hit her head and sustained a small laceration behind the right ear and to the right forearm.  States her last tetanus vaccination was in 2013.  She is on aspirin but no anticoagulation.  No neck or back pain.  No chest pain, shortness of breath, abdominal pain, vomiting, diarrhea, fever.  Reports she has had a dry cough due to allergies.  She does report over the past ?month (history changes from 2 weeks - 6 weeks per daughter and patient) she has had increasing pain and swelling in the right distal lower extremity and is unable to dorsiflex this foot.  She states the top of her foot feels numb.  She is scheduled to see orthopedics on May 17.  Daughter reports they last saw her primary care physician in mid February but does not remember if she had numbness or weakness in her foot at that time..  Denies any back pain, back injury.  No other numbness or weakness.  No bowel or bladder incontinence.  States due to the weakness in this foot she has a hard time walking around.     History is limited as patient and daughter are poor historians.      Past Medical History:  Diagnosis Date  . Arthritis   . Depression   . Diverticulitis   . DVT (deep venous thrombosis) (HCC)    Right leg   . Dyspnea   . Family history of  adverse reaction to anesthesia    PONV sister  . GERD (gastroesophageal reflux disease)   . History of hiatal hernia   . Hyperlipidemia   . Hypertension   . Mitral valve regurgitation   . Neuromuscular disorder ([place])   . 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 Pneumonia   . Renal disorder     Patient Active Problem List   Diagnosis Date Noted  . Goals of care, counseling/discussion 08/04/2020  . Neck pain 08/04/2020  . Abnormal MRI, neck 08/04/2020  . History of plasmacytoma 08/04/2020  . Orthostatic hypotension 09/13/2016  . Dehydration 09/13/2016  . Trismus 09/12/2016  . Syncope 09/11/2016  . Poor appetite 09/11/2016  . Atherosclerosis of native coronary artery of native heart without angina pectoris 05/13/2016  . Hiatal hernia without gangrene and obstruction 12/01/2015  . Herpes zona 11/25/2015  . Hx of deep vein thrombophlebitis of lower extremity 11/25/2015  . Senile ecchymosis 11/14/2015  . Mixed hyperlipidemia 11/14/2015  . Acute kidney failure ([place]) 11/09/2015  . Arthritis, degenerative 11/09/2015  . Cervical spinal cord compression ([place]) 11/09/2015  . Osteoporosis, post-menopausal 11/09/2015  . Female genuine stress incontinence 11/09/2015  . Polyneuropathy 09/30/2014  . Benign essential HTN 06/08/2014  . MI (mitral incompetence) 06/08/2014  . 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[place]) 05/28/2013    Past Surgical History:  Procedure Laterality Date  . ABDOMINAL HYSTERECTOMY  partial  . ABDOMINAL SURGERY    . bladder tack  1990  . BREAST BIOPSY  1974  . CATARACT EXTRACTION, BILATERAL    . CERVICAL SPINE SURGERY     tumor removed  . HERNIA REPAIR    . KYPHOPLASTY N/A 03/12/2018   Procedure: [id number];  Surgeon: Hessie Knows, MD;  Location: [facility] ORS;  Service: Orthopedics;  Laterality: N/A;  . KYPHOPLASTY N/A 06/30/2018   Procedure: Marcos Eke;  Surgeon: Hessie Knows, MD;  Location: [facility] ORS;  Service: Orthopedics;  Laterality: N/A;  . plasmacytoma resection  2005   lumbar     Prior to Admission medications   Medication Sig Start Date End Date Taking? Authorizing Provider  cephALEXin (KEFLEX) 500 MG capsule Take 1 capsule (500 mg total) by mouth 2 (two) times daily. 04/18/21  Yes Shirlyn Savin, Cyril Mourning N, DO  acetaminophen-codeine (TYLENOL #3) 300-30 MG tablet Take 1 tablet by mouth every 12 (twelve) hours as needed for severe pain. 09/15/20   Earlie Server, MD  alendronate (FOSAMAX) 70 MG tablet Take 70 mg by mouth once a week. Sunday 06/01/18   [provider]  aspirin EC 81 MG tablet Take 1 tablet by mouth daily.    [provider]  cholecalciferol (VITAMIN D) 400 units TABS tablet Take 400 Units by mouth daily.    [provider]  cyclobenzaprine (FLEXERIL) 5 MG tablet Take 5-10 mg by mouth 3 (three) times daily as needed for muscle spasms.    [provider]  diphenhydrAMINE (BENADRYL) 25 MG tablet Take 25 mg by mouth at bedtime.    [provider]  DULoxetine (CYMBALTA) 60 MG capsule Take 60 mg by mouth daily.    [provider]  esomeprazole (NEXIUM) 40 MG capsule Take by mouth. 07/11/20 07/11/21  [provider]  losartan (COZAAR) 50 MG tablet Take 25 mg by mouth daily.     [provider]  Melatonin 10 MG CAPS Take by mouth at bedtime as needed.    [provider]  Multiple Vitamin (MULTIVITAMIN WITH MINERALS) TABS tablet Take 1 tablet by mouth daily.    [provider]  Multiple Vitamins-Minerals (PRESERVISION AREDS PO) Take by mouth.    [provider]  Omega-3 Fatty Acids (FISH OIL) 1000 MG CAPS Take 2 capsules by mouth daily.     [provider]  ondansetron (ZOFRAN) 8 MG tablet Take 1 tablet (8 mg total) by mouth every 8 (eight) hours as needed for nausea or vomiting. 10/31/20   Noreene Filbert, MD  Polyvinyl Alcohol-Povidone (REFRESH OP) Apply 1-2 drops to eye as needed (dry eyes).     [provider]  promethazine (PHENERGAN) 12.5 MG tablet Take 1 tablet  (12.5 mg total) by mouth every 6 (six) hours as needed for nausea or vomiting. 11/12/20   Cuthriell, Charline Bills, PA-C  triamterene-hydrochlorothiazide (DYAZIDE) 37.5-25 MG capsule Take 1 capsule by mouth daily.    [provider]    Allergies Doxycycline hyclate, Levofloxacin, and Sulfa antibiotics  Family History  Problem Relation Age of Onset  . Heart failure Mother   . Heart failure Father   . COPD Sister   . Prostate cancer Brother   . COPD Brother     Social History Social History   Tobacco Use  . Smoking status: Never Smoker  . Smokeless tobacco: Never Used  Vaping Use  . Vaping Use: Never used  Substance Use Topics  . Alcohol use: No    Alcohol/week: 0.0 standard drinks  .  Drug use: No    Review of Systems Constitutional: No fever. Eyes: No visual changes. ENT: No sore throat. Cardiovascular: Denies chest pain. Respiratory: Denies shortness of breath. Gastrointestinal: No nausea, vomiting, diarrhea. Genitourinary: Negative for dysuria. Musculoskeletal: Negative for back pain. Skin: Negative for rash. Neurological: + Weakness and numbness in the right foot   ____________________________________________   PHYSICAL EXAM:  VITAL SIGNS: ED Triage Vitals  Enc Vitals Group     BP 04/18/21 0003 (!) 141/90     Pulse Rate 04/18/21 0003 (!) 118     Resp 04/18/21 0003 16     Temp 04/18/21 0003 98.6 F (37 C)     Temp Source 04/18/21 0003 Oral     SpO2 04/18/21 0003 96 %     Weight 04/18/21 0004 124 lb (56.2 kg)     Height 04/18/21 0004 5\' 5"  (1.651 m)     Head Circumference --      Peak Flow --      Pain Score 04/18/21 0003 5     Pain Loc --      Pain Edu? --      Excl. in [place]? --    CONSTITUTIONAL: Alert and oriented and responds appropriately to questions. Well-appearing; well-nourished; GCS 15 HEAD: Normocephalic; superficial 0.5 cm laceration to the posterior right ear EYES: Conjunctivae clear, PERRL, EOMI ENT: normal nose; no rhinorrhea;  moist mucous membranes; pharynx without lesions noted; no dental injury; no septal hematoma NECK: Supple, no meningismus, no LAD; no midline spinal tenderness, step-off or deformity; trachea midline CARD: RRR; S1 and S2 appreciated; no murmurs, no clicks, no rubs, no gallops RESP: Normal chest excursion without splinting or tachypnea; breath sounds clear and equal bilaterally; no wheezes, no rhonchi, no rales; no hypoxia or respiratory distress CHEST:  chest wall stable, no crepitus or ecchymosis or deformity, nontender to palpation; no flail chest ABD/GI: Normal bowel sounds; non-distended; soft, non-tender, no rebound, no guarding; no ecchymosis or other lesions noted PELVIS:  stable, nontender to palpation BACK:  The back appears normal and is non-tender to palpation, there is no CVA tenderness; no midline spinal tenderness, step-off or deformity; + kyphosis EXT: 10 cm V-shaped laceration to the right forearm, no underlying bony tenderness or bony deformity; patient also has tenderness palpation over the right dorsal foot, ankle and distal tibia and fibula with associated soft tissue swelling without ecchymosis.  2+ DP and radial pulses bilaterally.  She has normal dorsiflexion in the left foot but absent dorsiflexion in the right.  Slightly diminished reflexes diffusely.  Reports numbness in the top of the right foot but otherwise sensation intact diffusely.  No facial asymmetry.  Normal speech.  Otherwise able to move extremities normally. SKIN: Normal color for age and race; warm NEURO: Moves all extremities equally other than right-sided foot drop PSYCH: The patient's mood and manner are appropriate. Grooming and personal hygiene are appropriate.  ____________________________________________   LABS (all labs ordered are listed, but only abnormal results are displayed)  Labs Reviewed  BASIC METABOLIC PANEL - Abnormal; Notable for the following components:      Result Value   Glucose, Bld  154 (*)    BUN 29 (*)    All other components within normal limits  URINALYSIS, ROUTINE W REFLEX MICROSCOPIC - Abnormal; Notable for the following components:   Color, Urine YELLOW (*)    APPearance HAZY (*)    Ketones, ur 5 (*)    Nitrite POSITIVE (*)    Leukocytes,Ua LARGE (*)  Bacteria, UA MANY (*)    All other components within normal limits  URINE CULTURE  CBC WITH DIFFERENTIAL/PLATELET   ____________________________________________  EKG   EKG Interpretation  Date/Time:  Wednesday April 18 2021 02:30:25 EDT Ventricular Rate:  108 PR Interval:  134 QRS Duration: 105 QT Interval:  345 QTC Calculation: 463 R Axis:   -2 Text Interpretation: Sinus tachycardia Ventricular premature complex Low voltage, precordial leads Minimal ST depression, lateral leads Confirmed by Pryor Curia [phone]) on 04/18/2021 5:28:48 AM       ____________________________________________  RADIOLOGY Jessie Foot Keylen Uzelac, personally viewed and evaluated these images (plain radiographs) as part of my medical decision making, as well as reviewing the written report by the radiologist.  ED MD interpretation: X-ray showed no fracture, dislocation.  Doppler shows no DVT.  CT head and cervical spine show no acute traumatic injury.  MRI of the lumbar spine shows lumbar radiculopathy at L4-L5.  Official radiology report(s): DG Tibia/Fibula Right  Result Date: 04/18/2021 CLINICAL DATA:  Pain and swelling after a fall. EXAM: RIGHT TIBIA AND FIBULA - 2 VIEW COMPARISON:  None. FINDINGS: Soft tissue swelling about the ankle. No evidence of acute fracture or dislocation. Vascular calcifications. IMPRESSION: Soft tissue swelling. No acute bony abnormalities. Electronically Signed   By: Lucienne Capers M.D.   On: 04/18/2021 01:11   DG Ankle Complete Right  Result Date: 04/18/2021 CLINICAL DATA:  Foot pain and swelling after a fall EXAM: RIGHT ANKLE - COMPLETE 3+ VIEW COMPARISON:  None. FINDINGS: Diffuse soft tissue  swelling about the right ankle, most prominent at the lateral ankle. No acute fracture or dislocation is identified. Vascular calcifications. IMPRESSION: Soft tissue swelling. No acute bony abnormalities. Electronically Signed   By: Lucienne Capers M.D.   On: 04/18/2021 01:10   CT Head Wo Contrast  Result Date: 04/18/2021 CLINICAL DATA:  [place], striking right side of head. No loss of consciousness. No anticoagulants. EXAM: CT HEAD WITHOUT CONTRAST CT CERVICAL SPINE WITHOUT CONTRAST TECHNIQUE: Multidetector CT imaging of the head and cervical spine was performed following the standard protocol without intravenous contrast. Multiplanar CT image reconstructions of the cervical spine were also generated. COMPARISON:  MRI cervical spine 08/02/2020 FINDINGS: CT HEAD FINDINGS Brain: No evidence of acute infarction, hemorrhage, hydrocephalus, extra-axial collection or mass lesion/mass effect. Diffuse cerebral atrophy. Ventricular dilatation likely due to central atrophy. Low-attenuation changes in the deep white matter consistent with small vessel ischemia. Vascular: Moderate intracranial arterial vascular calcifications. Skull: Calvarium appears intact. Sinuses/Orbits: Paranasal sinuses are clear. Opacification of all of the left and part of the right mastoid air cells. Mucosal thickening in the left middle ear. Other: None. CT CERVICAL SPINE FINDINGS Alignment: Chronic abnormal alignment at C2-3 corresponding to old fracture deformity. Skull base and vertebrae: Postoperative changes with posterior rod and screw fixation from C1-C4. Old healed fracture deformities of C2. The right posterior C1 fixation screw appears to extend into the interspace between the skull base and C1. No acute vertebral compression deformities. Degenerative changes in the temporomandibular joints. Benign-appearing sclerosis, likely bone island, in the right T2 transverse process. Bilateral cervical ribs. Soft tissues and spinal canal: No  prevertebral soft tissue swelling. No abnormal paraspinal soft tissue mass or infiltration. Disc levels: Degenerative changes throughout the cervical spine with narrowed disc spaces and endplate hypertrophic change. Degenerative changes in the facet joints. Upper chest: Scarring in the visualized lung apices. Other: Vascular calcifications. IMPRESSION: 1. No acute intracranial abnormalities. Chronic atrophy and small vessel ischemic changes. 2. Bilateral mastoid  effusions. 3. Postoperative changes with posterior rod and screw fixation from C1-C4. Old healed fracture deformities of C2. The right posterior C1 fixation screw appears to extend into the interspace between the skull base and C1. 4. Degenerative changes throughout the cervical spine. No acute displaced fractures identified. 5. Bilateral cervical ribs. Electronically Signed   By: Lucienne Capers M.D.   On: 04/18/2021 01:43   CT Cervical Spine Wo Contrast  Result Date: 04/18/2021 CLINICAL DATA:  [place], striking right side of head. No loss of consciousness. No anticoagulants. EXAM: CT HEAD WITHOUT CONTRAST CT CERVICAL SPINE WITHOUT CONTRAST TECHNIQUE: Multidetector CT imaging of the head and cervical spine was performed following the standard protocol without intravenous contrast. Multiplanar CT image reconstructions of the cervical spine were also generated. COMPARISON:  MRI cervical spine 08/02/2020 FINDINGS: CT HEAD FINDINGS Brain: No evidence of acute infarction, hemorrhage, hydrocephalus, extra-axial collection or mass lesion/mass effect. Diffuse cerebral atrophy. Ventricular dilatation likely due to central atrophy. Low-attenuation changes in the deep white matter consistent with small vessel ischemia. Vascular: Moderate intracranial arterial vascular calcifications. Skull: Calvarium appears intact. Sinuses/Orbits: Paranasal sinuses are clear. Opacification of all of the left and part of the right mastoid air cells. Mucosal thickening in the left  middle ear. Other: None. CT CERVICAL SPINE FINDINGS Alignment: Chronic abnormal alignment at C2-3 corresponding to old fracture deformity. Skull base and vertebrae: Postoperative changes with posterior rod and screw fixation from C1-C4. Old healed fracture deformities of C2. The right posterior C1 fixation screw appears to extend into the interspace between the skull base and C1. No acute vertebral compression deformities. Degenerative changes in the temporomandibular joints. Benign-appearing sclerosis, likely bone island, in the right T2 transverse process. Bilateral cervical ribs. Soft tissues and spinal canal: No prevertebral soft tissue swelling. No abnormal paraspinal soft tissue mass or infiltration. Disc levels: Degenerative changes throughout the cervical spine with narrowed disc spaces and endplate hypertrophic change. Degenerative changes in the facet joints. Upper chest: Scarring in the visualized lung apices. Other: Vascular calcifications. IMPRESSION: 1. No acute intracranial abnormalities. Chronic atrophy and small vessel ischemic changes. 2. Bilateral mastoid effusions. 3. Postoperative changes with posterior rod and screw fixation from C1-C4. Old healed fracture deformities of C2. The right posterior C1 fixation screw appears to extend into the interspace between the skull base and C1. 4. Degenerative changes throughout the cervical spine. No acute displaced fractures identified. 5. Bilateral cervical ribs. Electronically Signed   By: Lucienne Capers M.D.   On: 04/18/2021 01:43   MR LUMBAR SPINE WO CONTRAST  Result Date: 04/18/2021 CLINICAL DATA:  Initial evaluation for low back pain, progressive neurologic deficit, right foot drop, recent fall. EXAM: MRI LUMBAR SPINE WITHOUT CONTRAST TECHNIQUE: Multiplanar, multisequence MR imaging of the lumbar spine was performed. No intravenous contrast was administered. COMPARISON:  Previous MRI from 02/26/2018. FINDINGS: Segmentation: Standard. Lowest  well-formed disc space labeled the L5-S1 level. Alignment: Trace levoscoliosis. 6 mm anterolisthesis of L4 on L5, chronic and facet mediated. Additional trace anterolisthesis of L5 on S1. Appearance is stable from previous. Vertebrae: Chronic compression deformities involving the T11 and T12 vertebral bodies with sequelae of prior vertebral augmentation. No residual marrow edema. Associated bony retropulsion of up to 4 mm at the level of T12 without significant spinal stenosis. Vertebral body height otherwise maintained without acute, subacute, or chronic fracture. Bone marrow signal intensity diffusely heterogeneous but overall within normal limits. Few scattered benign hemangiomata noted. No worrisome osseous lesions. Minimal reactive marrow edema noted within the right pedicle  of L5, likely reactive in nature. No other abnormal marrow edema. Conus medullaris and cauda equina: Conus extends to the L1 level. Conus and cauda equina appear normal. Paraspinal and other soft tissues: Paraspinous soft tissues demonstrate no acute finding. Mild chronic fatty atrophy noted within the posterior paraspinous musculature. Multiple scattered benign appearing cyst noted about the visualized kidneys, largest of which measures 1.7 cm on the left. Dilatation of the visualized common bile duct likely related to post cholecystectomy changes, similar to previous. Visualized visceral structures otherwise unremarkable. Disc levels: T11-12: Disc desiccation with mild disc bulge. Associated 4 mm bony retropulsion related to the chronic T12 compression fracture. Mild facet hypertrophy. No significant spinal stenosis. Foramina remain patent. T12-L1: Disc desiccation without significant disc bulge. 4 mm bony retropulsion related to the chronic T12 compression fracture. No significant spinal stenosis. Foramina remain patent. L1-2: Mild disc bulge with superimposed small central disc protrusion (series 8, image 11). No significant spinal  stenosis. Foramina remain patent. L2-3: Mild disc bulge with disc desiccation. Mild bilateral facet hypertrophy. No significant spinal stenosis. Foramina remain patent. L3-4: Mild disc bulge with disc desiccation. Prominent central annular fissure. Mild to moderate facet and ligament flavum hypertrophy. Resultant mild narrowing of the lateral recesses bilaterally. Central canal remains patent. Mild left L3 foraminal stenosis. Right neural foramen remains patent. L4-5: 6 mm anterolisthesis. Disc desiccation with mild diffuse disc bulge, asymmetric to the right. Superimposed broad-based central to right foraminal disc protrusion (series 8, image 28). Disc material closely approximates both the exiting right L4 and descending L5 nerve roots without frank impingement. Moderate to advanced right worse than left facet hypertrophy. Mild spinal stenosis, with moderate right worse than left lateral recess narrowing. Mild to moderate right L4 foraminal stenosis. Left neural foramina remains patent. L5-S1: Disc desiccation with mild disc bulge. Superimposed small left foraminal disc protrusion closely approximates the exiting left L5 nerve root (series 6, image 12). Severe left with mild right facet hypertrophy. No significant spinal stenosis. Mild left L5 foraminal narrowing. Right neural foramina remains patent. IMPRESSION: 1. No acute fracture or other abnormality within the lumbar spine. 2. Broad central to right foraminal disc protrusion at L4-5, closely approximating and potentially irritating either the exiting right L4 or descending L5 nerve roots. Finding could contribute to right lower extremity symptoms. 3. Small left foraminal disc protrusion at L5-S1, potentially affecting the exiting left L5 nerve root. 4. Chronic facet mediated 6 mm anterolisthesis of L4 on L5 with associated moderate right worse than left lateral recess narrowing. 5. Chronic T11 and T12 compression fractures with sequelae of prior vertebral  augmentation. Electronically Signed   By: Jeannine Boga M.D.   On: 04/18/2021 02:01   US Venous Img Lower Unilateral Right  Result Date: 04/18/2021 CLINICAL DATA:  Right leg pain EXAM: RIGHT LOWER EXTREMITY VENOUS DOPPLER ULTRASOUND TECHNIQUE: Gray-scale sonography with compression, as well as color and duplex ultrasound, were performed to evaluate the deep venous system(s) from the level of the common femoral vein through the popliteal and proximal calf veins. COMPARISON:  None. FINDINGS: VENOUS Normal compressibility of the common femoral, superficial femoral, and popliteal veins, as well as the visualized calf veins. Visualized portions of profunda femoral vein and great saphenous vein unremarkable. No filling defects to suggest DVT on grayscale or color Doppler imaging. Doppler waveforms show normal direction of venous flow, normal respiratory plasticity and response to augmentation. Limited views of the contralateral common femoral vein are unremarkable. OTHER None. Limitations: none IMPRESSION: Negative. Electronically Signed  By: Fidela Salisbury MD   On: 04/18/2021 03:21   DG Foot Complete Right  Result Date: 04/18/2021 CLINICAL DATA:  Right foot pain after a fall.  Swelling. EXAM: RIGHT FOOT COMPLETE - 3+ VIEW COMPARISON:  None. FINDINGS: Diffuse bone demineralization. Mild hallux valgus. Degenerative changes in the interphalangeal joints and intertarsal joints. Soft tissue swelling at the ankle. Vascular calcifications. No acute fracture or dislocation. IMPRESSION: No acute bony abnormalities. Degenerative changes in the foot. Soft tissue swelling. Electronically Signed   By: Lucienne Capers M.D.   On: 04/18/2021 01:10    ____________________________________________   PROCEDURES  Procedure(s) performed (including Critical Care):  Procedures     LACERATION REPAIR Performed by: Cyril Mourning Balbina Depace Authorized by: Cyril Mourning Shemiah Rosch Consent: Verbal consent obtained. Risks and benefits:  risks, benefits and alternatives were discussed Consent given by: patient Patient identity confirmed: provided demographic data Prepped and Draped in normal sterile fashion Wound explored  Laceration Location: Right forearm  Laceration Length: 10cm  No Foreign Bodies seen or palpated  Anesthesia: local infiltration  Local anesthetic: lidocaine 2% with epinephrine  Anesthetic total: 8 ml  Irrigation method: syringe Amount of cleaning: standard  Skin closure: Superficial  Number of sutures: 15  Technique: Area anesthetized using lidocaine 2% with epinephrine. Wound irrigated copiously with sterile saline. Wound then cleaned with Betadine and draped in sterile fashion. Wound closed using 15 simple interrupted sutures with 5-0 Vicryl.  Bacitracin and sterile dressing applied. Good wound approximation and hemostasis achieved.    Patient tolerance: Patient tolerated the procedure well with no immediate complications.   ____________________________________________   INITIAL IMPRESSION / ASSESSMENT AND PLAN / ED COURSE  As part of my medical decision making, I reviewed the following data within the [place] History obtained from family, Nursing notes reviewed and incorporated, Labs reviewed , EKG interpreted , Old EKG reviewed, Old chart reviewed, Radiograph reviewed , A consult was requested and obtained from this/these consultant(s) Neurosurgery and Notes from prior ED visits         Patient here with a fall.  Will obtain CT of her head and cervical spine given she did hit her head and is 87 years old.  Appears to be at her neurologic baseline other than a foot drop which she states has been present for over a month.  Discussed with patient that this could be radicular in nature versus peripheral.  I have recommended an MRI of her back today and she agrees to this.  She also has pain and swelling over the foot, ankle and distal calf.  She has a history of DVT.   Will obtain x-rays to evaluate for fracture as well as a venous Doppler to rule out DVT today.  She is tachycardic here.  Will obtain EKG, basic labs, urine.  Will update her tetanus vaccination.  She will need laceration repair to her right upper extremity.  ED PROGRESS  Patient's labs have been reassuring today.  EKG is nonischemic.  She does appear to have a urinary tract infection.  Culture pending.  Given Rocephin here in the ED.  X-rays of her right lower extremity show no fracture, dislocation and venous Doppler shows no DVT.  CT of her head and cervical spine showed no acute traumatic injury today.  MRI of her lumbar spine does show a broad central to right foraminal disc protrusion at L4-L5 that potentially could be irritating the exiting right L4 or descending L5 nerve roots that could contribute to her right  foot drop and right foot numbness.  I have discussed the case with Dr. Lacinda Axon on-call for neurosurgery.  Appreciate his help.  He would like to come into the emergency department in the morning to see the patient.  Patient and daughter at bedside are comfortable with this plan.  Patient states that after she is seen by neurosurgery that she would like to be discharged home and does not want admission at this time.  Her laceration has been cleaned and repaired.   7:00 AM  Pt resting comfortably and awaiting neurosurgery recommendations prior to discharge home.   I reviewed all nursing notes and pertinent previous records as available.  I have reviewed and interpreted any EKGs, lab and urine results, imaging (as available).  ____________________________________________   FINAL CLINICAL IMPRESSION(S) / ED DIAGNOSES  Final diagnoses:  Fall, initial encounter  Injury of head, initial encounter  Arm laceration, right, initial encounter  Acute UTI  Right foot drop  Lumbar radiculopathy     ED Discharge Orders         Ordered    cephALEXin (KEFLEX) 500 MG capsule  2 times  daily        04/18/21 0534          *Please note:  Khadija Sherrice Creekmore was evaluated in Emergency Department on 04/18/2021 for the symptoms described in the history of present illness. She was evaluated in the context of the global COVID-19 pandemic, which necessitated consideration that the patient might be at risk for infection with the SARS-CoV-2 virus that causes COVID-19. Institutional protocols and algorithms that pertain to the evaluation of patients at risk for COVID-19 are in a state of rapid change based on information released by regulatory bodies including the CDC and federal and state organizations. These policies and algorithms were followed during the patient's care in the ED.  Some ED evaluations and interventions may be delayed as a result of limited staffing during and the pandemic.*   Note:  This document was prepared using Dragon voice recognition software and may include unintentional dictation errors.   Samika Vetsch, Delice Bison, DO 04/18/21 [phone]

## 2021-04-18 NOTE — ED Notes (Signed)
Pt verbalized understanding of d/c instructions at this time. Pt given opportunity to ask questions as needed. Pt assisted to POV via wheelchair, tolerated well, NAD noted.

## 2021-04-18 NOTE — ED Notes (Addendum)
Pt x2 assist to bedside toilet to provide urine sample

## 2021-04-18 NOTE — ED Notes (Signed)
XR at bedside

## 2021-04-18 NOTE — ED Notes (Addendum)
[place] at bedside. EKG given to Ward DO. Suture cart at bedside

## 2021-04-18 NOTE — Evaluation (Signed)
Physical Therapy Evaluation Patient Details Name: Amy [name] MRN: [id number] DOB: January 31, 1929 Today's Date: 04/18/2021   History of Present Illness  Pt presents from home with a cc of fall just PTA. Pt reports that her right leg "just gave out". Pt has large skin tear to R elbow and R ear. Swelling to RLE. Pt reports this was previously present. Xrays negative.    Clinical Impression  Patient received on stretcher, daughters at bedside. Patient wants to go home. She is agreeable to PT session. Patient requires min guard for scooting off stretcher to standing. Patient was able to walk over to toilet with B hand held assist, then with walker ambulated 150 feet. She has right foot drop. Would benefit from AFO to prevent tripping again. RN left note that she would benefit from this. Will require order for one. Patient will continue to benefit from skilled PT to improve functional mobility and safety for return home.     Follow Up Recommendations No PT follow up    Equipment Recommendations  None recommended by PT    Recommendations for Other Services       Precautions / Restrictions Precautions Precautions: Fall Restrictions Weight Bearing Restrictions: No      Mobility  Bed Mobility Overal bed mobility: Needs Assistance Bed Mobility: Supine to Sit;Sit to Supine     Supine to sit: Min guard Sit to supine: Min assist   General bed mobility comments: patient requires min guard for initial standing off stretcher. Requied mod assist for scooting back once on stretcher.    Transfers Overall transfer level: Needs assistance Equipment used: Rolling walker (2 wheeled) Transfers: Sit to/from Stand Sit to Stand: Min guard            Ambulation/Gait Ambulation/Gait assistance: Min guard Gait Distance (Feet): 150 Feet Assistive device: Rolling walker (2 wheeled) Gait Pattern/deviations: Step-through pattern;Decreased step length - right;Decreased step length - left;Trunk  flexed;Steppage Gait velocity: decr   General Gait Details: patient ambulates with trunk flexed, foot drop on right.  Stairs            Wheelchair Mobility    Modified Rankin (Stroke Patients Only)       Balance Overall balance assessment: Needs assistance Sitting-balance support: Bilateral upper extremity supported Sitting balance-Leahy Scale: Fair     Standing balance support: Bilateral upper extremity supported;During functional activity Standing balance-Leahy Scale: Fair Standing balance comment: patient requires min guard with ambulation and reliant on B UE support                             Pertinent Vitals/Pain Pain Assessment: Faces Faces Pain Scale: Hurts a little bit Pain Location: R knee with initial ambulation, improved with increased distance. Pain Descriptors / Indicators: Discomfort Pain Intervention(s): Limited activity within patient's tolerance;Monitored during session    Home Living Family/patient expects to be discharged to:: Private residence Living Arrangements: Children Available Help at Discharge: Family;Available 24 hours/day Type of Home: House Home Access: Stairs to enter Entrance Stairs-Rails: Left;Right;Can reach both Entrance Stairs-Number of Steps: 7 Home Layout: One level Home Equipment: Walker - 4 wheels      Prior Function Level of Independence: Independent with assistive device(s)               Hand Dominance        Extremity/Trunk Assessment   Upper Extremity Assessment Upper Extremity Assessment: Overall WFL for tasks assessed    Lower Extremity Assessment  Lower Extremity Assessment: Overall WFL for tasks assessed    Cervical / Trunk Assessment Cervical / Trunk Assessment: Normal  Communication   Communication: No difficulties  Cognition Arousal/Alertness: Awake/alert Behavior During Therapy: WFL for tasks assessed/performed Overall Cognitive Status: Within Functional Limits for tasks  assessed                                        General Comments      Exercises     Assessment/Plan    PT Assessment Patient needs continued PT services  PT Problem List Decreased strength;Decreased mobility;Decreased activity tolerance;Decreased balance       PT Treatment Interventions DME instruction;Therapeutic exercise;Gait training;Balance training;Functional mobility training;Therapeutic activities;Patient/family education    PT Goals (Current goals can be found in the Care Plan section)  Acute Rehab PT Goals Patient Stated Goal: patient wants to go home PT Goal Formulation: With patient Time For Goal Achievement: 04/21/21 Potential to Achieve Goals: Good    Frequency Min 2X/week   Barriers to discharge        Co-evaluation               AM-PAC PT "6 Clicks" Mobility  Outcome Measure Help needed turning from your back to your side while in a flat bed without using bedrails?: None Help needed moving from lying on your back to sitting on the side of a flat bed without using bedrails?: None Help needed moving to and from a bed to a chair (including a wheelchair)?: A Little Help needed standing up from a chair using your arms (e.g., wheelchair or bedside chair)?: A Little Help needed to walk in hospital room?: A Little Help needed climbing 3-5 steps with a railing? : A Little 6 Click Score: 20    End of Session   Activity Tolerance: Patient tolerated treatment well Patient left: in bed;with call bell/phone within reach;with family/visitor present Nurse Communication: Mobility status PT Visit Diagnosis: Other abnormalities of gait and mobility (R26.89);Muscle weakness (generalized) (M62.81);History of falling (Z91.81)    Time: 4010-2725 PT Time Calculation (min) (ACUTE ONLY): 18 min   Charges:   PT Evaluation $PT Eval Moderate Complexity: 1 Mod PT Treatments $Gait Training: 8-22 mins        [employer], PT, GCS 04/18/21,12:52  PM

## 2021-04-18 NOTE — ED Notes (Signed)
Neurosurgeon at bedside °

## 2021-04-18 NOTE — Progress Notes (Signed)
Orthopedic Tech Progress Note Patient Details:  Amy [name] February 08, 1929 [id number] Ordered brace Patient ID: [name], female   DOB: 08-Apr-1929, 87 y.o.   MRN: [id number]   Amy [name] 04/18/2021, 1:58 PM

## 2021-04-18 NOTE — ED Notes (Signed)
Report given to Ally RN.

## 2021-04-18 NOTE — ED Notes (Signed)
Pt resting comfortably on stretcher, denies needs at this time

## 2021-04-18 NOTE — Discharge Instructions (Addendum)
You may take Tylenol 1000 mg every 6 hours as needed for pain.  This is found over-the-counter.  Please take your antibiotics for your urinary tract infection until complete.  You have 15 sutures in your right arm.  These are absorbable and will not need to be removed.  You may clean this area gently with warm soap and water 1-2 times a day and apply over-the-counter Neosporin until healed.  You were found to have a foot drop of the right foot.  This is likely coming from a herniated disc in your lower back.  Please follow-up with Dr. Lacinda Axon with neurosurgery.

## 2021-04-18 NOTE — ED Notes (Addendum)
Pt requesting to call her daughter because she is ready to go home. Called daughter and updated on neurosurgeon consult. Pt is not ready for disposition at this time, daughter aware.

## 2021-04-18 NOTE — TOC Transition Note (Signed)
Transition of Care [facility]) - CM/SW Discharge Note   Patient Details  Name: Mahika Vanvoorhis MRN: [id number] Date of Birth: 1929-02-08  Transition of [facility]) CM/SW Contact:  Adelene Amas, [place] Phone Number: 04/18/2021, 3:34 PM   Clinical Narrative:     Patient will d/c with PT/OT w/ [facility].    Barriers to Discharge: No Barriers Identified   Patient Goals and CMS Choice Patient states their goals for this hospitalization and ongoing recovery are:: get better      Discharge Placement                       Discharge Plan and Services                                     Social Determinants of Health (SDOH) Interventions     Readmission Risk Interventions No flowsheet data found.

## 2021-04-18 NOTE — ED Notes (Signed)
Spoke with the ortho tech [phone]. Requested the AFO Brace for RT leg. They said they will be here in approx 1 to 2 hrs.

## 2021-04-18 NOTE — ED Notes (Signed)
Pt awaiting NSG to see. Provided grape juice at this time

## 2021-04-20 LAB — URINE CULTURE: Culture: >=100000 — AB

## 2021-04-21 NOTE — Progress Notes (Signed)
ED Antimicrobial Stewardship Positive Culture Follow Up   Amy [name] is an 87 y.o. female who presented to [facility] on 04/17/2021 with [name] chief complaint of  Chief Complaint  Patient presents with  . Fall    Recent Results (from the past 720 hour(s))  Urine Culture     Status: Abnormal   Collection Time: 04/18/21  2:25 AM   Specimen: Urine, Random  Result Value Ref Range Status   Specimen Description   Final    URINE, RANDOM Performed at [facility], [address] [place], [place] [address]    Special Requests   Final    NONE Performed at [facility], [address] [place], [place] [address]    Culture >=100,000 COLONIES/mL ESCHERICHIA COLI ([name])  Final   Report Status 04/20/2021 FINAL  Final   Organism ID, Bacteria ESCHERICHIA COLI ([name])  Final      Susceptibility   Escherichia coli - MIC*    AMPICILLIN >=32 RESISTANT Resistant     CEFAZOLIN >=64 RESISTANT Resistant     CEFEPIME <=0.12 SENSITIVE Sensitive     CEFTRIAXONE 32 RESISTANT Resistant     CIPROFLOXACIN <=0.25 SENSITIVE Sensitive     GENTAMICIN >=16 RESISTANT Resistant     IMIPENEM <=0.25 SENSITIVE Sensitive     NITROFURANTOIN <=16 SENSITIVE Sensitive     TRIMETH/SULFA <=20 SENSITIVE Sensitive     AMPICILLIN/SULBACTAM >=32 RESISTANT Resistant     PIP/TAZO 16 SENSITIVE Sensitive     * >=100,000 COLONIES/mL ESCHERICHIA COLI    [x]  Treated with Cephalexin, organism resistant to prescribed antimicrobial  Discussed with MD whether this might be asymptomatic bacteruria  ED Provider: Marjean Donna  04/21/21 1545 called patient at [phone]. Patient unable to come to phone and spoke with her daughter [name]. Patient is afebrile, has not had urinary s/s. [name] said her mother has had UTIs in the past and her Mother did not think she had symptoms of [name] UTI. [name] said they have [name] follow-up vist this Tues 5/3 with Dr. Sabra Heck at [facility]. Patient can discontinue cephalexin at this time.  [name] will watch and monitor patient for changes, urinary symptoms etc. If changes occur consider visit to Provider to assess if different antibiotic is warranted.   Amy [name] 04/21/2021, 4:06 PM Clinical Pharmacist

## 2021-04-24 DIAGNOSIS — M5416 Radiculopathy, lumbar region: Secondary | ICD-10-CM | POA: Insufficient documentation

## 2022-08-29 ENCOUNTER — Encounter (INDEPENDENT_AMBULATORY_CARE_PROVIDER_SITE_OTHER): Payer: Medicare Other | Admitting: Ophthalmology

## 2022-09-03 ENCOUNTER — Encounter (INDEPENDENT_AMBULATORY_CARE_PROVIDER_SITE_OTHER): Payer: Medicare Other | Admitting: Ophthalmology

## 2022-09-09 ENCOUNTER — Encounter (INDEPENDENT_AMBULATORY_CARE_PROVIDER_SITE_OTHER): Payer: Medicare Other | Admitting: Ophthalmology

## 2022-09-09 DIAGNOSIS — H353231 Exudative age-related macular degeneration, bilateral, with active choroidal neovascularization: Secondary | ICD-10-CM

## 2022-09-09 DIAGNOSIS — H43813 Vitreous degeneration, bilateral: Secondary | ICD-10-CM | POA: Diagnosis not present

## 2022-09-09 DIAGNOSIS — I10 Essential (primary) hypertension: Secondary | ICD-10-CM

## 2022-09-09 DIAGNOSIS — H35033 Hypertensive retinopathy, bilateral: Secondary | ICD-10-CM

## 2022-10-10 ENCOUNTER — Encounter (INDEPENDENT_AMBULATORY_CARE_PROVIDER_SITE_OTHER): Payer: Medicare Other | Admitting: Ophthalmology

## 2022-10-10 DIAGNOSIS — H35033 Hypertensive retinopathy, bilateral: Secondary | ICD-10-CM | POA: Diagnosis not present

## 2022-10-10 DIAGNOSIS — H43813 Vitreous degeneration, bilateral: Secondary | ICD-10-CM

## 2022-10-10 DIAGNOSIS — I10 Essential (primary) hypertension: Secondary | ICD-10-CM | POA: Diagnosis not present

## 2022-10-10 DIAGNOSIS — H353231 Exudative age-related macular degeneration, bilateral, with active choroidal neovascularization: Secondary | ICD-10-CM | POA: Diagnosis not present

## 2022-11-07 ENCOUNTER — Encounter (INDEPENDENT_AMBULATORY_CARE_PROVIDER_SITE_OTHER): Payer: Medicare Other | Admitting: Ophthalmology

## 2022-11-07 DIAGNOSIS — H35033 Hypertensive retinopathy, bilateral: Secondary | ICD-10-CM | POA: Diagnosis not present

## 2022-11-07 DIAGNOSIS — I10 Essential (primary) hypertension: Secondary | ICD-10-CM | POA: Diagnosis not present

## 2022-11-07 DIAGNOSIS — H43813 Vitreous degeneration, bilateral: Secondary | ICD-10-CM | POA: Diagnosis not present

## 2022-11-07 DIAGNOSIS — H353231 Exudative age-related macular degeneration, bilateral, with active choroidal neovascularization: Secondary | ICD-10-CM | POA: Diagnosis not present

## 2022-12-05 ENCOUNTER — Encounter (INDEPENDENT_AMBULATORY_CARE_PROVIDER_SITE_OTHER): Payer: Medicare Other | Admitting: Ophthalmology

## 2022-12-05 DIAGNOSIS — H35033 Hypertensive retinopathy, bilateral: Secondary | ICD-10-CM | POA: Diagnosis not present

## 2022-12-05 DIAGNOSIS — H353231 Exudative age-related macular degeneration, bilateral, with active choroidal neovascularization: Secondary | ICD-10-CM

## 2022-12-05 DIAGNOSIS — H43813 Vitreous degeneration, bilateral: Secondary | ICD-10-CM | POA: Diagnosis not present

## 2022-12-05 DIAGNOSIS — I10 Essential (primary) hypertension: Secondary | ICD-10-CM | POA: Diagnosis not present

## 2023-01-13 ENCOUNTER — Encounter (INDEPENDENT_AMBULATORY_CARE_PROVIDER_SITE_OTHER): Payer: Medicare Other | Admitting: Ophthalmology

## 2023-01-16 ENCOUNTER — Encounter (INDEPENDENT_AMBULATORY_CARE_PROVIDER_SITE_OTHER): Payer: Medicare Other | Admitting: Ophthalmology

## 2023-01-22 ENCOUNTER — Encounter (INDEPENDENT_AMBULATORY_CARE_PROVIDER_SITE_OTHER): Payer: Medicare Other | Admitting: Ophthalmology

## 2023-01-28 ENCOUNTER — Encounter (INDEPENDENT_AMBULATORY_CARE_PROVIDER_SITE_OTHER): Payer: Medicare Other | Admitting: Ophthalmology

## 2023-01-28 DIAGNOSIS — H43813 Vitreous degeneration, bilateral: Secondary | ICD-10-CM | POA: Diagnosis not present

## 2023-01-28 DIAGNOSIS — H353231 Exudative age-related macular degeneration, bilateral, with active choroidal neovascularization: Secondary | ICD-10-CM | POA: Diagnosis not present

## 2023-01-28 DIAGNOSIS — H35033 Hypertensive retinopathy, bilateral: Secondary | ICD-10-CM | POA: Diagnosis not present

## 2023-01-28 DIAGNOSIS — I10 Essential (primary) hypertension: Secondary | ICD-10-CM

## 2023-02-25 ENCOUNTER — Encounter (INDEPENDENT_AMBULATORY_CARE_PROVIDER_SITE_OTHER): Payer: Medicare Other | Admitting: Ophthalmology

## 2023-02-25 DIAGNOSIS — H35033 Hypertensive retinopathy, bilateral: Secondary | ICD-10-CM

## 2023-02-25 DIAGNOSIS — H353231 Exudative age-related macular degeneration, bilateral, with active choroidal neovascularization: Secondary | ICD-10-CM | POA: Diagnosis not present

## 2023-02-25 DIAGNOSIS — I10 Essential (primary) hypertension: Secondary | ICD-10-CM | POA: Diagnosis not present

## 2023-02-25 DIAGNOSIS — H43813 Vitreous degeneration, bilateral: Secondary | ICD-10-CM | POA: Diagnosis not present

## 2023-03-25 ENCOUNTER — Encounter (INDEPENDENT_AMBULATORY_CARE_PROVIDER_SITE_OTHER): Payer: Medicare Other | Admitting: Ophthalmology

## 2023-03-25 DIAGNOSIS — H353231 Exudative age-related macular degeneration, bilateral, with active choroidal neovascularization: Secondary | ICD-10-CM | POA: Diagnosis not present

## 2023-03-25 DIAGNOSIS — H35033 Hypertensive retinopathy, bilateral: Secondary | ICD-10-CM

## 2023-03-25 DIAGNOSIS — H43813 Vitreous degeneration, bilateral: Secondary | ICD-10-CM

## 2023-03-25 DIAGNOSIS — I10 Essential (primary) hypertension: Secondary | ICD-10-CM

## 2023-04-29 ENCOUNTER — Encounter (INDEPENDENT_AMBULATORY_CARE_PROVIDER_SITE_OTHER): Payer: Medicare Other | Admitting: Ophthalmology

## 2023-04-29 DIAGNOSIS — H35033 Hypertensive retinopathy, bilateral: Secondary | ICD-10-CM | POA: Diagnosis not present

## 2023-04-29 DIAGNOSIS — H43813 Vitreous degeneration, bilateral: Secondary | ICD-10-CM | POA: Diagnosis not present

## 2023-04-29 DIAGNOSIS — I10 Essential (primary) hypertension: Secondary | ICD-10-CM

## 2023-04-29 DIAGNOSIS — H353231 Exudative age-related macular degeneration, bilateral, with active choroidal neovascularization: Secondary | ICD-10-CM | POA: Diagnosis not present

## 2023-06-03 ENCOUNTER — Encounter (INDEPENDENT_AMBULATORY_CARE_PROVIDER_SITE_OTHER): Payer: Medicare Other | Admitting: Ophthalmology

## 2023-06-03 DIAGNOSIS — H35033 Hypertensive retinopathy, bilateral: Secondary | ICD-10-CM

## 2023-06-03 DIAGNOSIS — I10 Essential (primary) hypertension: Secondary | ICD-10-CM

## 2023-06-03 DIAGNOSIS — H43813 Vitreous degeneration, bilateral: Secondary | ICD-10-CM | POA: Diagnosis not present

## 2023-06-03 DIAGNOSIS — H353231 Exudative age-related macular degeneration, bilateral, with active choroidal neovascularization: Secondary | ICD-10-CM | POA: Diagnosis not present

## 2023-07-07 ENCOUNTER — Encounter (INDEPENDENT_AMBULATORY_CARE_PROVIDER_SITE_OTHER): Payer: Medicare Other | Admitting: Ophthalmology

## 2023-07-31 ENCOUNTER — Encounter (INDEPENDENT_AMBULATORY_CARE_PROVIDER_SITE_OTHER): Payer: Medicare Other | Admitting: Ophthalmology

## 2023-08-04 ENCOUNTER — Encounter (INDEPENDENT_AMBULATORY_CARE_PROVIDER_SITE_OTHER): Payer: Medicare Other | Admitting: Ophthalmology

## 2023-08-04 DIAGNOSIS — H43813 Vitreous degeneration, bilateral: Secondary | ICD-10-CM

## 2023-08-04 DIAGNOSIS — H353231 Exudative age-related macular degeneration, bilateral, with active choroidal neovascularization: Secondary | ICD-10-CM | POA: Diagnosis not present

## 2023-08-04 DIAGNOSIS — I10 Essential (primary) hypertension: Secondary | ICD-10-CM | POA: Diagnosis not present

## 2023-08-04 DIAGNOSIS — H35033 Hypertensive retinopathy, bilateral: Secondary | ICD-10-CM

## 2023-08-18 DIAGNOSIS — M21371 Foot drop, right foot: Secondary | ICD-10-CM | POA: Insufficient documentation

## 2023-09-08 ENCOUNTER — Encounter (INDEPENDENT_AMBULATORY_CARE_PROVIDER_SITE_OTHER): Payer: Medicare Other | Admitting: Ophthalmology

## 2023-09-08 ENCOUNTER — Emergency Department: Payer: Medicare Other

## 2023-09-08 ENCOUNTER — Encounter: Payer: Self-pay | Admitting: Emergency Medicine

## 2023-09-08 ENCOUNTER — Other Ambulatory Visit: Payer: Self-pay

## 2023-09-08 ENCOUNTER — Inpatient Hospital Stay
Admission: EM | Admit: 2023-09-08 | Discharge: 2023-09-12 | DRG: 871 | Disposition: A | Discharge status: Skilled Nursing Facility | Payer: Medicare Other | Source: Skilled Nursing Facility | Attending: Internal Medicine | Admitting: Internal Medicine

## 2023-09-08 DIAGNOSIS — Z1152 Encounter for screening for COVID-19: Secondary | ICD-10-CM

## 2023-09-08 DIAGNOSIS — M1990 Unspecified osteoarthritis, unspecified site: Secondary | ICD-10-CM | POA: Diagnosis present

## 2023-09-08 DIAGNOSIS — I2510 Atherosclerotic heart disease of native coronary artery without angina pectoris: Secondary | ICD-10-CM | POA: Diagnosis present

## 2023-09-08 DIAGNOSIS — Z882 Allergy status to sulfonamides status: Secondary | ICD-10-CM

## 2023-09-08 DIAGNOSIS — F32A Depression, unspecified: Secondary | ICD-10-CM | POA: Diagnosis present

## 2023-09-08 DIAGNOSIS — R296 Repeated falls: Secondary | ICD-10-CM | POA: Diagnosis present

## 2023-09-08 DIAGNOSIS — G9341 Metabolic encephalopathy: Secondary | ICD-10-CM | POA: Diagnosis present

## 2023-09-08 DIAGNOSIS — J189 Pneumonia, unspecified organism: Principal | ICD-10-CM | POA: Diagnosis present

## 2023-09-08 DIAGNOSIS — N1831 Chronic kidney disease, stage 3a: Secondary | ICD-10-CM | POA: Diagnosis present

## 2023-09-08 DIAGNOSIS — Z79899 Other long term (current) drug therapy: Secondary | ICD-10-CM

## 2023-09-08 DIAGNOSIS — Z8042 Family history of malignant neoplasm of prostate: Secondary | ICD-10-CM

## 2023-09-08 DIAGNOSIS — G9340 Encephalopathy, unspecified: Secondary | ICD-10-CM

## 2023-09-08 DIAGNOSIS — Z8672 Personal history of thrombophlebitis: Secondary | ICD-10-CM

## 2023-09-08 DIAGNOSIS — E878 Other disorders of electrolyte and fluid balance, not elsewhere classified: Secondary | ICD-10-CM | POA: Diagnosis present

## 2023-09-08 DIAGNOSIS — J9601 Acute respiratory failure with hypoxia: Secondary | ICD-10-CM | POA: Diagnosis present

## 2023-09-08 DIAGNOSIS — J9811 Atelectasis: Secondary | ICD-10-CM | POA: Diagnosis present

## 2023-09-08 DIAGNOSIS — I771 Stricture of artery: Secondary | ICD-10-CM | POA: Insufficient documentation

## 2023-09-08 DIAGNOSIS — R0902 Hypoxemia: Secondary | ICD-10-CM

## 2023-09-08 DIAGNOSIS — R079 Chest pain, unspecified: Secondary | ICD-10-CM | POA: Diagnosis not present

## 2023-09-08 DIAGNOSIS — Z8249 Family history of ischemic heart disease and other diseases of the circulatory system: Secondary | ICD-10-CM

## 2023-09-08 DIAGNOSIS — Z87440 Personal history of urinary (tract) infections: Secondary | ICD-10-CM

## 2023-09-08 DIAGNOSIS — I129 Hypertensive chronic kidney disease with stage 1 through stage 4 chronic kidney disease, or unspecified chronic kidney disease: Secondary | ICD-10-CM | POA: Diagnosis present

## 2023-09-08 DIAGNOSIS — R053 Chronic cough: Secondary | ICD-10-CM | POA: Diagnosis present

## 2023-09-08 DIAGNOSIS — K219 Gastro-esophageal reflux disease without esophagitis: Secondary | ICD-10-CM | POA: Diagnosis present

## 2023-09-08 DIAGNOSIS — R109 Unspecified abdominal pain: Secondary | ICD-10-CM | POA: Diagnosis present

## 2023-09-08 DIAGNOSIS — T502X5A Adverse effect of carbonic-anhydrase inhibitors, benzothiadiazides and other diuretics, initial encounter: Secondary | ICD-10-CM | POA: Diagnosis present

## 2023-09-08 DIAGNOSIS — Z90710 Acquired absence of both cervix and uterus: Secondary | ICD-10-CM

## 2023-09-08 DIAGNOSIS — E871 Hypo-osmolality and hyponatremia: Secondary | ICD-10-CM | POA: Diagnosis present

## 2023-09-08 DIAGNOSIS — A419 Sepsis, unspecified organism: Principal | ICD-10-CM | POA: Diagnosis present

## 2023-09-08 DIAGNOSIS — Z66 Do not resuscitate: Secondary | ICD-10-CM | POA: Diagnosis present

## 2023-09-08 DIAGNOSIS — N179 Acute kidney failure, unspecified: Secondary | ICD-10-CM | POA: Diagnosis present

## 2023-09-08 DIAGNOSIS — C9030 Solitary plasmacytoma not having achieved remission: Secondary | ICD-10-CM | POA: Diagnosis present

## 2023-09-08 DIAGNOSIS — I10 Essential (primary) hypertension: Secondary | ICD-10-CM | POA: Diagnosis present

## 2023-09-08 DIAGNOSIS — R54 Age-related physical debility: Secondary | ICD-10-CM | POA: Diagnosis present

## 2023-09-08 DIAGNOSIS — E785 Hyperlipidemia, unspecified: Secondary | ICD-10-CM | POA: Diagnosis present

## 2023-09-08 DIAGNOSIS — R630 Anorexia: Secondary | ICD-10-CM | POA: Diagnosis present

## 2023-09-08 DIAGNOSIS — J90 Pleural effusion, not elsewhere classified: Secondary | ICD-10-CM | POA: Diagnosis present

## 2023-09-08 DIAGNOSIS — R6520 Severe sepsis without septic shock: Secondary | ICD-10-CM | POA: Diagnosis present

## 2023-09-08 DIAGNOSIS — Z9181 History of falling: Secondary | ICD-10-CM

## 2023-09-08 DIAGNOSIS — Z7982 Long term (current) use of aspirin: Secondary | ICD-10-CM

## 2023-09-08 DIAGNOSIS — R000 Tachycardia, unspecified: Secondary | ICD-10-CM | POA: Insufficient documentation

## 2023-09-08 DIAGNOSIS — I708 Atherosclerosis of other arteries: Secondary | ICD-10-CM | POA: Diagnosis present

## 2023-09-08 DIAGNOSIS — Z6823 Body mass index (BMI) 23.0-23.9, adult: Secondary | ICD-10-CM

## 2023-09-08 DIAGNOSIS — Z825 Family history of asthma and other chronic lower respiratory diseases: Secondary | ICD-10-CM

## 2023-09-08 DIAGNOSIS — Z881 Allergy status to other antibiotic agents status: Secondary | ICD-10-CM

## 2023-09-08 LAB — CBC WITH DIFFERENTIAL/PLATELET
Abs Immature Granulocytes: 0.11 10*3/uL — ABNORMAL HIGH (ref 0.00–0.07)
Basophils Absolute: 0.1 10*3/uL (ref 0.0–0.1)
Basophils Relative: 1 %
Eosinophils Absolute: 0 10*3/uL (ref 0.0–0.5)
Eosinophils Relative: 0 %
HCT: 45.9 % (ref 36.0–46.0)
Hemoglobin: 13.8 g/dL (ref 12.0–15.0)
Immature Granulocytes: 1 %
Lymphocytes Relative: 9 %
Lymphs Abs: 1.4 10*3/uL (ref 0.7–4.0)
MCH: 28.7 pg (ref 26.0–34.0)
MCHC: 30.1 g/dL (ref 30.0–36.0)
MCV: 95.4 fL (ref 80.0–100.0)
Monocytes Absolute: 1 10*3/uL (ref 0.1–1.0)
Monocytes Relative: 7 %
Neutro Abs: 12.3 10*3/uL — ABNORMAL HIGH (ref 1.7–7.7)
Neutrophils Relative %: 82 %
Platelets: 250 10*3/uL (ref 150–400)
RBC: 4.81 MIL/uL (ref 3.87–5.11)
RDW: 14.4 % (ref 11.5–15.5)
WBC: 14.9 10*3/uL — ABNORMAL HIGH (ref 4.0–10.5)
nRBC: 0 % (ref 0.0–0.2)

## 2023-09-08 LAB — RESP PANEL BY RT-PCR (RSV, FLU A&B, COVID)  RVPGX2
Influenza A by PCR: NEGATIVE
Influenza B by PCR: NEGATIVE
Resp Syncytial Virus by PCR: NEGATIVE
SARS Coronavirus 2 by RT PCR: NEGATIVE

## 2023-09-08 LAB — COMPREHENSIVE METABOLIC PANEL
ALT: 19 U/L (ref 0–44)
AST: 16 U/L (ref 15–41)
Albumin: 3 g/dL — ABNORMAL LOW (ref 3.5–5.0)
Alkaline Phosphatase: 139 U/L — ABNORMAL HIGH (ref 38–126)
Anion gap: 18 — ABNORMAL HIGH (ref 5–15)
BUN: 32 mg/dL — ABNORMAL HIGH (ref 8–23)
CO2: 24 mmol/L (ref 22–32)
Calcium: 8.9 mg/dL (ref 8.9–10.3)
Chloride: 88 mmol/L — ABNORMAL LOW (ref 98–111)
Creatinine, Ser: 1.27 mg/dL — ABNORMAL HIGH (ref 0.44–1.00)
GFR, Estimated: 40 mL/min — ABNORMAL LOW (ref >60)
Glucose, Bld: 176 mg/dL — ABNORMAL HIGH (ref 70–99)
Potassium: 4 mmol/L (ref 3.5–5.1)
Sodium: 130 mmol/L — ABNORMAL LOW (ref 135–145)
Total Bilirubin: 1.1 mg/dL (ref 0.3–1.2)
Total Protein: 7.1 g/dL (ref 6.5–8.1)

## 2023-09-08 LAB — PROTIME-INR
INR: 1.2 (ref 0.8–1.2)
Prothrombin Time: 15.6 s — ABNORMAL HIGH (ref 11.4–15.2)

## 2023-09-08 LAB — LACTIC ACID, PLASMA: Lactic Acid, Venous: 1.8 mmol/L (ref 0.5–1.9)

## 2023-09-08 MED ORDER — ACETAMINOPHEN 325 MG PO TABS
650.0000 mg | ORAL_TABLET | Freq: Once | ORAL | Status: AC
  Administered 2023-09-08: 650 mg via ORAL
  Filled 2023-09-08: qty 2

## 2023-09-08 MED ORDER — SODIUM CHLORIDE 0.9 % IV SOLN
2.0000 g | Freq: Once | INTRAVENOUS | Status: AC
  Administered 2023-09-08: 2 g via INTRAVENOUS
  Filled 2023-09-08: qty 12.5

## 2023-09-08 MED ORDER — LACTATED RINGERS IV BOLUS (SEPSIS)
1000.0000 mL | Freq: Once | INTRAVENOUS | Status: AC
  Administered 2023-09-08: 1000 mL via INTRAVENOUS

## 2023-09-08 MED ORDER — SODIUM CHLORIDE 0.9 % IV SOLN
500.0000 mg | INTRAVENOUS | Status: DC
  Administered 2023-09-09 – 2023-09-10 (×3): 500 mg via INTRAVENOUS
  Filled 2023-09-08 (×3): qty 5

## 2023-09-08 MED ORDER — IOHEXOL 350 MG/ML SOLN
75.0000 mL | Freq: Once | INTRAVENOUS | Status: AC | PRN
  Administered 2023-09-08: 75 mL via INTRAVENOUS

## 2023-09-08 NOTE — ED Triage Notes (Signed)
First Nurse Note: Patient to ED via [facility] from [facility] for generalized weakness and abd pain. O2 86% on RA placed on 4L now 92%.  EMS VS 124 cbg 101.3  92% 4L 113/70 119 HR

## 2023-09-08 NOTE — ED Triage Notes (Signed)
Pt in via [facility] from [facility] with daughter, who states pt has been having weakness, cough, abdominal and back pain x few days. Arrives to triage on 4LNC - sats 91%, does not wear home O2. Daughter also reports some confusion and temp of 101 PTA.

## 2023-09-08 NOTE — ED Provider Notes (Signed)
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  History obtained primarily from patient's daughter.  She reports that over the past few days she has noticed increased confusion.  She has had a cough, no known sick contacts but recently moved into [facility].  Has a history of recurrent UTIs and has been complaining of flank pain for a few days.  Did have a fever of 101.3 with EMS.     Physical Exam   Triage Vital Signs: ED Triage Vitals  Encounter Vitals Group     BP 09/08/23 1911 127/75     Systolic BP Percentile --      Diastolic BP Percentile --      Pulse Rate 09/08/23 1911 (!) 126     Resp 09/08/23 1911 (!) 22     Temp 09/08/23 1911 98.9 F (37.2 C)     Temp Source 09/08/23 1911 Oral     SpO2 09/08/23 1911 91 %     Weight 09/08/23 1912 144 lb (65.3 kg)     Height --      Head Circumference --      Peak Flow --      Pain Score --      Pain Loc --      Pain Education --      Exclude from Growth Chart --     Most recent vital signs: Vitals:   09/08/23 2200 09/08/23 2215  BP:    Pulse: 91 87  Resp: (!) 21 (!) 22  Temp:    SpO2: 93% 93%     General: Somnolent but arousable CV:  Tachycardic with regular rhythm, normal peripheral perfusion, mild bilateral lower extremity edema Resp:  Slightly coarse lung sounds bilaterally with tachypnea, no appreciable wheezing or crackles Abd:  Soft, nondistended, does appear to have some tenderness over the right side of the abdomen into the right flank Neuro:  No gross facial asymmetry, oriented to self, speaking in sentences not appropriate to context, generalized weakness without appreciable asymmetry   ED  Results / Procedures / Treatments   Labs (all labs ordered are listed, but only abnormal results are displayed) Labs Reviewed  COMPREHENSIVE METABOLIC PANEL - Abnormal; Notable for the following components:      Result Value   Sodium 130 (*)    Chloride 88 (*)    Glucose, Bld 176 (*)    BUN 32 (*)    Creatinine, Ser 1.27 (*)    Albumin 3.0 (*)    Alkaline Phosphatase 139 (*)    GFR, Estimated 40 (*)    Anion gap 18 (*)    All other components within normal limits  CBC WITH DIFFERENTIAL/PLATELET - Abnormal; Notable for the following components:   WBC 14.9 (*)    Neutro Abs 12.3 (*)    Abs Immature Granulocytes 0.11 (*)    All other components within normal limits  PROTIME-INR - Abnormal; Notable for the following components:   Prothrombin Time 15.6 (*)    All other components within normal limits  RESP PANEL BY RT-PCR (RSV, FLU A&B, COVID)  RVPGX2  CULTURE, BLOOD (ROUTINE X 2)  CULTURE, BLOOD (ROUTINE  X 2)  LACTIC ACID, PLASMA  LACTIC ACID, PLASMA  URINALYSIS, W/ REFLEX TO CULTURE (INFECTION SUSPECTED)     EKG EKG independently reviewed interpreted by myself (ER attending) demonstrates:  EKG demonstrates sinus tachycardia at a rate of 120, PR 120, QRS 108, QTc 448, artifact present, possible rate related mild ST changes  RADIOLOGY Imaging independently reviewed and interpreted by myself demonstrates:  CXR with possible bibasilar consolidations CT head without acute bleed CTA of the chest without PE CT abdomen pelvis without pyelonephritis or other acute findings  PROCEDURES:  Critical Care performed: Yes, see critical care procedure note(s)  CRITICAL CARE Performed by: Trinna Post   Total critical care time: 31 minutes  Critical care time was exclusive of separately billable procedures and treating other patients.  Critical care was necessary to treat or prevent imminent or life-threatening deterioration.  Critical care was time spent personally by me on the  following activities: development of treatment plan with patient and/or surrogate as well as nursing, discussions with consultants, evaluation of patient's response to treatment, examination of patient, obtaining history from patient or surrogate, ordering and performing treatments and interventions, ordering and review of laboratory studies, ordering and review of radiographic studies, pulse oximetry and re-evaluation of patient's condition.   Procedures   MEDICATIONS ORDERED IN ED: Medications  azithromycin (ZITHROMAX) 500 mg in sodium chloride 0.9 % 250 mL IVPB (has no administration in time range)  lactated ringers bolus 1,000 mL (1,000 mLs Intravenous New Bag/Given 09/08/23 2013)  ceFEPIme (MAXIPIME) 2 g in sodium chloride 0.9 % 100 mL IVPB (0 g Intravenous Stopped 09/08/23 2044)  acetaminophen (TYLENOL) tablet 650 mg (650 mg Oral Given 09/08/23 2015)  iohexol (OMNIPAQUE) 350 MG/ML injection 75 mL (75 mLs Intravenous Contrast Given 09/08/23 2048)     IMPRESSION / MDM / ASSESSMENT AND PLAN / ED COURSE  I reviewed the triage vital signs and the nursing notes.  Differential diagnosis includes, but is not limited to, infection including pneumonia, UTI, PE, pneumothorax, CHF exacerbation, acute bleed, other acute intracranial process  Patient's presentation is most consistent with acute presentation with potential threat to life or bodily function.  89 year old female presenting to the emergency department for evaluation of fever and altered mental status, found to be hypoxic and tachycardic on presentation.  Patient did meet sepsis criteria, ordered for IV fluids and empiric cefepime given high suspicion for urinary source.  Chest x-Kataya [place]im[place] with some infiltrates concerning for pneumonia, but with history of DVT and new hypoxia and tachycardia, CT of the chest was obtained without evidence of PE.  Patient also reporting flank pain so CT ordered which was without acute findings.  Lab work notable for  leukocytosis at 14.9.  AKI noted with creatinine of 1.27.  Normal lactate.  After IV fluids, patient with significant improvement in her heart rate, remains on supplemental oxygen.  Given questionable pneumonia, will add on azithromycin.  Will reach out to hospitalist team to discuss admission.     FINAL CLINICAL IMPRESSION(S) / ED DIAGNOSES   Final diagnoses:  Pneumonia due to infectious organism, unspecified laterality, unspecified part of lung  Hypoxia  Acute encephalopathy     Rx / DC Orders   ED Discharge Orders     None        Note:  This document was prepared using Dragon voice recognition software and may include unintentional dictation errors.   Trinna Post, MD 09/08/23 [phone]

## 2023-09-08 NOTE — Consult Note (Signed)
CODE SEPSIS - PHARMACY COMMUNICATION  **Broad Spectrum Antibiotics should be administered within 1 hour of Sepsis diagnosis**  Time Code Sepsis Called/Page Received: 1939  Antibiotics Ordered: cefepime  Time of 1st antibiotic administration: 2014  Additional action taken by pharmacy: none  If necessary, Name of Provider/Nurse Contacted: n/a    Bettey Costa ,PharmD Clinical Pharmacist  09/08/2023  7:39 PM

## 2023-09-08 NOTE — Consult Note (Signed)
PHARMACY -  BRIEF ANTIBIOTIC NOTE   Pharmacy has received consult(s) for Cefepime from an ED provider.  The patient's profile has been reviewed for ht/wt/allergies/indication/available labs.    One time order(s) placed for Cefepime 2g IV.  Further antibiotics/pharmacy consults should be ordered by admitting physician if indicated.                       Thank you, Bettey Costa 09/08/2023  7:39 PM

## 2023-09-08 NOTE — Sepsis Progress Note (Signed)
Elink monitoring for the code sepsis protocol.  

## 2023-09-09 DIAGNOSIS — R053 Chronic cough: Secondary | ICD-10-CM

## 2023-09-09 DIAGNOSIS — G9341 Metabolic encephalopathy: Secondary | ICD-10-CM

## 2023-09-09 DIAGNOSIS — A419 Sepsis, unspecified organism: Secondary | ICD-10-CM | POA: Diagnosis present

## 2023-09-09 DIAGNOSIS — R109 Unspecified abdominal pain: Secondary | ICD-10-CM

## 2023-09-09 DIAGNOSIS — I771 Stricture of artery: Secondary | ICD-10-CM | POA: Insufficient documentation

## 2023-09-09 DIAGNOSIS — G9340 Encephalopathy, unspecified: Secondary | ICD-10-CM | POA: Diagnosis not present

## 2023-09-09 DIAGNOSIS — E785 Hyperlipidemia, unspecified: Secondary | ICD-10-CM | POA: Diagnosis present

## 2023-09-09 DIAGNOSIS — N179 Acute kidney failure, unspecified: Secondary | ICD-10-CM | POA: Diagnosis present

## 2023-09-09 DIAGNOSIS — Z8249 Family history of ischemic heart disease and other diseases of the circulatory system: Secondary | ICD-10-CM | POA: Diagnosis not present

## 2023-09-09 DIAGNOSIS — E878 Other disorders of electrolyte and fluid balance, not elsewhere classified: Secondary | ICD-10-CM

## 2023-09-09 DIAGNOSIS — C9030 Solitary plasmacytoma not having achieved remission: Secondary | ICD-10-CM | POA: Diagnosis present

## 2023-09-09 DIAGNOSIS — R296 Repeated falls: Secondary | ICD-10-CM | POA: Diagnosis present

## 2023-09-09 DIAGNOSIS — R000 Tachycardia, unspecified: Secondary | ICD-10-CM | POA: Insufficient documentation

## 2023-09-09 DIAGNOSIS — E871 Hypo-osmolality and hyponatremia: Secondary | ICD-10-CM | POA: Diagnosis present

## 2023-09-09 DIAGNOSIS — J9811 Atelectasis: Secondary | ICD-10-CM | POA: Diagnosis present

## 2023-09-09 DIAGNOSIS — Z825 Family history of asthma and other chronic lower respiratory diseases: Secondary | ICD-10-CM | POA: Diagnosis not present

## 2023-09-09 DIAGNOSIS — J9601 Acute respiratory failure with hypoxia: Secondary | ICD-10-CM

## 2023-09-09 DIAGNOSIS — Z66 Do not resuscitate: Secondary | ICD-10-CM | POA: Diagnosis present

## 2023-09-09 DIAGNOSIS — J189 Pneumonia, unspecified organism: Secondary | ICD-10-CM

## 2023-09-09 DIAGNOSIS — Z8042 Family history of malignant neoplasm of prostate: Secondary | ICD-10-CM | POA: Diagnosis not present

## 2023-09-09 DIAGNOSIS — Z87440 Personal history of urinary (tract) infections: Secondary | ICD-10-CM

## 2023-09-09 DIAGNOSIS — N1831 Chronic kidney disease, stage 3a: Secondary | ICD-10-CM | POA: Diagnosis present

## 2023-09-09 DIAGNOSIS — R54 Age-related physical debility: Secondary | ICD-10-CM | POA: Insufficient documentation

## 2023-09-09 DIAGNOSIS — I708 Atherosclerosis of other arteries: Secondary | ICD-10-CM | POA: Diagnosis present

## 2023-09-09 DIAGNOSIS — J90 Pleural effusion, not elsewhere classified: Secondary | ICD-10-CM | POA: Diagnosis present

## 2023-09-09 DIAGNOSIS — I129 Hypertensive chronic kidney disease with stage 1 through stage 4 chronic kidney disease, or unspecified chronic kidney disease: Secondary | ICD-10-CM | POA: Diagnosis present

## 2023-09-09 DIAGNOSIS — R6520 Severe sepsis without septic shock: Secondary | ICD-10-CM | POA: Diagnosis present

## 2023-09-09 DIAGNOSIS — I2510 Atherosclerotic heart disease of native coronary artery without angina pectoris: Secondary | ICD-10-CM | POA: Diagnosis present

## 2023-09-09 DIAGNOSIS — Z1152 Encounter for screening for COVID-19: Secondary | ICD-10-CM | POA: Diagnosis not present

## 2023-09-09 DIAGNOSIS — F32A Depression, unspecified: Secondary | ICD-10-CM | POA: Diagnosis present

## 2023-09-09 LAB — CBC
HCT: 34.4 % — ABNORMAL LOW (ref 36.0–46.0)
Hemoglobin: 11.3 g/dL — ABNORMAL LOW (ref 12.0–15.0)
MCH: 29 pg (ref 26.0–34.0)
MCHC: 32.8 g/dL (ref 30.0–36.0)
MCV: 88.4 fL (ref 80.0–100.0)
Platelets: 215 10*3/uL (ref 150–400)
RBC: 3.89 MIL/uL (ref 3.87–5.11)
RDW: 14.2 % (ref 11.5–15.5)
WBC: 13 10*3/uL — ABNORMAL HIGH (ref 4.0–10.5)
nRBC: 0 % (ref 0.0–0.2)

## 2023-09-09 LAB — URINALYSIS, W/ REFLEX TO CULTURE (INFECTION SUSPECTED)
Bilirubin Urine: NEGATIVE
Glucose, UA: NEGATIVE mg/dL
Hgb urine dipstick: NEGATIVE
Ketones, ur: NEGATIVE mg/dL
Nitrite: NEGATIVE
Protein, ur: NEGATIVE mg/dL
Specific Gravity, Urine: 1.03 (ref 1.005–1.030)
pH: 5 (ref 5.0–8.0)

## 2023-09-09 LAB — TROPONIN I (HIGH SENSITIVITY): Troponin I (High Sensitivity): 9 ng/L (ref <18)

## 2023-09-09 LAB — LACTIC ACID, PLASMA: Lactic Acid, Venous: 1.2 mmol/L (ref 0.5–1.9)

## 2023-09-09 LAB — GLUCOSE, CAPILLARY: Glucose-Capillary: 127 mg/dL — ABNORMAL HIGH (ref 70–99)

## 2023-09-09 LAB — BASIC METABOLIC PANEL
Anion gap: 11 (ref 5–15)
BUN: 29 mg/dL — ABNORMAL HIGH (ref 8–23)
CO2: 29 mmol/L (ref 22–32)
Calcium: 8.5 mg/dL — ABNORMAL LOW (ref 8.9–10.3)
Chloride: 93 mmol/L — ABNORMAL LOW (ref 98–111)
Creatinine, Ser: 1.1 mg/dL — ABNORMAL HIGH (ref 0.44–1.00)
GFR, Estimated: 47 mL/min — ABNORMAL LOW (ref >60)
Glucose, Bld: 155 mg/dL — ABNORMAL HIGH (ref 70–99)
Potassium: 3.6 mmol/L (ref 3.5–5.1)
Sodium: 133 mmol/L — ABNORMAL LOW (ref 135–145)

## 2023-09-09 LAB — PROCALCITONIN: Procalcitonin: 2.09 ng/mL

## 2023-09-09 LAB — D-DIMER, QUANTITATIVE: D-Dimer, Quant: 3.67 ug{FEU}/mL — ABNORMAL HIGH (ref 0.00–0.50)

## 2023-09-09 MED ORDER — VANCOMYCIN HCL 1250 MG/250ML IV SOLN: 1250.0000 mg | INTRAVENOUS | Status: DC

## 2023-09-09 MED ORDER — NITROGLYCERIN 0.4 MG SL SUBL
0.4000 mg | SUBLINGUAL_TABLET | SUBLINGUAL | Status: DC | PRN
  Administered 2023-09-09: 0.4 mg via SUBLINGUAL

## 2023-09-09 MED ORDER — ASPIRIN 81 MG PO TBEC
81.0000 mg | DELAYED_RELEASE_TABLET | Freq: Every day | ORAL | Status: DC
  Administered 2023-09-09 – 2023-09-12 (×4): 81 mg via ORAL
  Filled 2023-09-09 (×4): qty 1

## 2023-09-09 MED ORDER — ACETAMINOPHEN 325 MG PO TABS
650.0000 mg | ORAL_TABLET | Freq: Four times a day (QID) | ORAL | Status: DC | PRN
  Administered 2023-09-09 (×2): 650 mg via ORAL
  Filled 2023-09-09 (×2): qty 2

## 2023-09-09 MED ORDER — ENOXAPARIN SODIUM 30 MG/0.3ML IJ SOSY
30.0000 mg | PREFILLED_SYRINGE | INTRAMUSCULAR | Status: DC
  Administered 2023-09-09: 30 mg via SUBCUTANEOUS
  Filled 2023-09-09: qty 0.3

## 2023-09-09 MED ORDER — ACETAMINOPHEN 500 MG PO TABS
1000.0000 mg | ORAL_TABLET | Freq: Three times a day (TID) | ORAL | Status: DC | PRN
  Administered 2023-09-09 – 2023-09-10 (×2): 1000 mg via ORAL
  Filled 2023-09-09 (×2): qty 2

## 2023-09-09 MED ORDER — ALBUTEROL SULFATE (2.5 MG/3ML) 0.083% IN NEBU: 2.5000 mg | INHALATION_SOLUTION | RESPIRATORY_TRACT | Status: DC | PRN

## 2023-09-09 MED ORDER — ACETAMINOPHEN 325 MG RE SUPP: 650.0000 mg | Freq: Four times a day (QID) | RECTAL | Status: DC | PRN

## 2023-09-09 MED ORDER — VANCOMYCIN HCL 1500 MG/300ML IV SOLN
1500.0000 mg | Freq: Once | INTRAVENOUS | Status: AC
  Administered 2023-09-09: 1500 mg via INTRAVENOUS
  Filled 2023-09-09: qty 300

## 2023-09-09 MED ORDER — SODIUM CHLORIDE 0.9 % IV SOLN: 500.0000 mg | INTRAVENOUS | Status: DC

## 2023-09-09 MED ORDER — NITROGLYCERIN 0.4 MG SL SUBL
SUBLINGUAL_TABLET | SUBLINGUAL | Status: AC
  Filled 2023-09-09: qty 1

## 2023-09-09 MED ORDER — GUAIFENESIN ER 600 MG PO TB12
600.0000 mg | ORAL_TABLET | Freq: Two times a day (BID) | ORAL | Status: DC
  Administered 2023-09-09 – 2023-09-12 (×8): 600 mg via ORAL
  Filled 2023-09-09 (×8): qty 1

## 2023-09-09 MED ORDER — LACTATED RINGERS IV SOLN
150.0000 mL/h | INTRAVENOUS | Status: DC
  Administered 2023-09-09: 150 mL/h via INTRAVENOUS

## 2023-09-09 MED ORDER — ONDANSETRON HCL 4 MG PO TABS: 4.0000 mg | ORAL_TABLET | Freq: Four times a day (QID) | ORAL | Status: DC | PRN

## 2023-09-09 MED ORDER — SODIUM CHLORIDE 0.9 % IV SOLN
2.0000 g | INTRAVENOUS | Status: DC
  Administered 2023-09-09: 2 g via INTRAVENOUS
  Filled 2023-09-09 (×2): qty 12.5

## 2023-09-09 MED ORDER — ONDANSETRON HCL 4 MG/2ML IJ SOLN
4.0000 mg | Freq: Four times a day (QID) | INTRAMUSCULAR | Status: DC | PRN
  Administered 2023-09-10 – 2023-09-12 (×2): 4 mg via INTRAVENOUS
  Filled 2023-09-09 (×2): qty 2

## 2023-09-09 NOTE — IPAL (Signed)
  Interdisciplinary Goals of Care Family Meeting   Date carried out: 09/09/2023  Location of the meeting: Phone conference  Member's involved: Physician and Family Member or next of kin  Durable Power of Attorney or acting medical decision maker: Daughter Leo Bellucci  Discussion: We discussed goals of care for [employer]   I have reviewed medical records including EPIC notes, labs and imaging, assessed the patient and then met with daughter to discuss major active diagnoses, plan of care, natural trajectory, prognosis, GOC, EOL wishes, disposition and options including Full code/DNI/DNR and the concept of comfort care if DNR is elected. Questions and concerns were addressed.  Daughter [name] conferred with his sister and the are  in agreement to continue current plan of care . Election for DNR status.   Code status:   Code Status: Prior   Disposition: Continue current acute care  Time spent for the meeting: 30    Andris Baumann, MD  09/09/2023, 1:48 AM

## 2023-09-09 NOTE — Assessment & Plan Note (Signed)
Continue aspirin, losartan

## 2023-09-09 NOTE — Progress Notes (Signed)
PHARMACIST - PHYSICIAN COMMUNICATION  CONCERNING:  Enoxaparin (Lovenox) for DVT Prophylaxis    RECOMMENDATION: Patient was prescribed enoxaprin 40mg  q24 hours for VTE prophylaxis.   Filed Weights   09/08/23 1912  Weight: 65.3 kg (144 lb)    Body mass index is 23.96 kg/m.  Estimated Creatinine Clearance: 25.4 mL/min (A) (by C-G formula based on SCr of 1.27 mg/dL (H)).  Patient is candidate for enoxaparin 30mg  every 24 hours based on CrCl <78ml/min or Weight <45kg  DESCRIPTION: Pharmacy has adjusted enoxaparin dose per [facility] policy.  Patient is now receiving enoxaparin 30 mg every 24 hours   Otelia Sergeant, PharmD, [facility] 09/09/2023 1:55 AM

## 2023-09-09 NOTE — Evaluation (Addendum)
Clinical/Bedside Swallow Evaluation Patient Details  Name: [name] MRN: [id number] Date of Birth: 1929/08/31  Today's Date: 09/09/2023 Time: SLP Start Time (ACUTE ONLY): 0815 SLP Stop Time (ACUTE ONLY): 0930 SLP Time Calculation (min) (ACUTE ONLY): 75 min  Past Medical History:  Past Medical History:  Diagnosis Date   Arthritis    Depression    Diverticulitis    DVT (deep venous thrombosis) (HCC)    Right leg    Dyspnea    Family history of adverse reaction to anesthesia    PONV sister   GERD (gastroesophageal reflux disease)    History of hiatal hernia    Hyperlipidemia    Hypertension    Mitral valve regurgitation    Neuromuscular disorder (HCC)    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HCC)    Pneumonia    Renal disorder    Past Surgical History:  Past Surgical History:  Procedure Laterality Date   ABDOMINAL HYSTERECTOMY     partial   ABDOMINAL SURGERY     bladder tack  1990   BREAST BIOPSY  1974   CATARACT EXTRACTION, BILATERAL     CERVICAL SPINE SURGERY     tumor removed   HERNIA REPAIR     KYPHOPLASTY N/A 03/12/2018   Procedure: [id number];  Surgeon: Kennedy Bucker, MD;  Location: [facility] ORS;  Service: Orthopedics;  Laterality: N/A;   KYPHOPLASTY N/A 06/30/2018   Procedure: Shauna Hugh;  Surgeon: Kennedy Bucker, MD;  Location: [facility] ORS;  Service: Orthopedics;  Laterality: N/A;   plasmacytoma resection  2005   lumbar   HPI:  Pt is a 89 y.o. female with medical history significant for HTN, nocturnal tachycardia on diltiazem, plasmacytoma not on treatment and history of recurrent UTIs frequent falls,  who was brought from [name] for evaluation of weakness cough and increasing confusion developing over the past few days.  Daughter [name] daughter said she had had complained of right flank pain.  States that she was placed in ALF 2 weeks prior due to frequent falls and feels like she has not been adjusting well.  Patient who has foot drop and uses a brace and a walker had  not been as ambulant as she used to be over the past several weeks and also had a chronic cough and been sitting up in the recliner to sleep.  Since going to the facility they have been putting her in the bed to sleep.  Daughter states that she has overall been declining in recent weeks requiring move to [name](prior to that, pt had been staying w/ the 3 dtrs on different days).  CT Of Chest: Dense areas of linear consolidation at the lung bases with associated volume loss consistent with atelectasis. There are trace bilateral partially loculated pleural effusions. No pneumothorax. Central airways are patent. Large Hiatal Hernia.    Assessment / Plan / Recommendation  Clinical Impression   Pt seen for BSE. Pt awake, verbal. Followed instructions w/ cues. P thas suspected mild Cognitive decline, per Daugher's agreement. Min, Dry cough at baseline PRIOR TO any po's given. Daughter present.  On  O2 5L; afebrile. WBC trending down.  Pt appears to present w/ min Oral phase dysphagia w/ functional pharyngeal phase swallowing w/ No immediate, overt clinical s/s of aspiration during the po trials. Pt appears to present w/ suspected slower Esophageal phase motility and impact from such. Pt has a Dx'd Large Hiatal Hernia per Imaging.  Pt appears at reduced risk for aspiration when following general aspiration precautions and supported w/  a more Minced foods diet for ease of intake overall.  Pt appears to have challenging factors that could impact her oropharyngeal swallowing to include discomfort(NSG aware), fatigue/weakness, Mild Cognitive decline, and advanced age as well as current need for support w/ feeding at meals. ALSO: pt has a Dx'd Large Hiatal Hernia -- ANY Esophageal phase Dysmotility can increase risk for Retrograde flow and aspiration of REFLUX material. These factors can increase risk for aspiration, dysphagia as well as decreased oral intake overall.   During po trials, pt consumed all  consistencies given w/ no immediate, overt coughing, decline in vocal quality, or change in respiratory presentation during/post trials. A mild, dry cough occurred infrequently during the po trials (as PRIOR TO po's) and did not increase in intensity/frequency w/ the continued po trials. O2 sats remained 95-98% during trials. Rest breaks were given to support baseline Pulmonary status and reduce any increased WOB. Oral phase appeared min lengthy for bolus management, mastication, and control of bolus propulsion for timely A-P transfer for swallowing. Oral clearing achieved w/ all trial consistencies -- min more Time needed for softened solids.  OM Exam appeared [facility] w/ no unilateral weakness noted. Speech Clear. Pt was able to hold cup to feed self w/ setup support.   Recommend a more Minced diet consistency w/ well-moistened foods(Dtr stated she was "cutting up her foods real small" at home); Thin liquids via Cup -- pt should help to [employer] when drinking. Recommend general aspiration precautions, tray setup and sitting support. Reduce distractions at meals. Rest Breaks for conservation of energy. Pills CRUSHED in Puree for safer, easier swallowing.  Thorough Education given on Pills in Puree; food consistencies and easy to eat options; general aspiration precautions to pt and Dtr. ST services will follow for toleration of diet; education next 1-3 days. MD/NSG updated, agreed. Recommend Dietician f/u for support. Recommend Palliative Care consult for GOC discussion per Daughter's description of overall decline in functioning at advanced age. SLP Visit Diagnosis: Dysphagia, oral phase (R13.11);Dysphagia, pharyngoesophageal phase (R13.14) (suspected slower Esophageal phase motility/clearing)    Aspiration Risk  Mild aspiration risk;Risk for inadequate nutrition/hydration (reduced following general aspiration precautions)    Diet Recommendation   Thin;Dysphagia 2 (chopped) (moistened well) - a more Minced  diet consistency w/ well-moistened foods(Dtr stated she was "cutting up her foods real small" at home); Thin liquids via Cup -- pt should help to [employer] when drinking. Recommend general aspiration precautions, tray setup and sitting support. Reduce distractions at meals. Rest Breaks for conservation of energy.  Medication Administration: Crushed with puree    Other  Recommendations Recommended Consults: Consider GI evaluation;Consider esophageal assessment (Dietician; Palliative Care f/u for GOC discussion) Oral Care Recommendations: Oral care BID;Oral care before and after PO;Staff/trained caregiver to provide oral care ([employer] care)    Recommendations for follow up therapy are one component of a multi-disciplinary discharge planning process, led by the attending physician.  Recommendations may be updated based on patient status, additional functional criteria and insurance authorization.  Follow up Recommendations Follow physician's recommendations for discharge plan and follow up therapies (at SNF)      Assistance Recommended at Discharge  FULL  Functional Status Assessment Patient has had a recent decline in their functional status and demonstrates the ability to make significant improvements in function in a reasonable and predictable amount of time. (hopefully)  Frequency and Duration min 2x/week  2 weeks       Prognosis Prognosis for improved oropharyngeal function: Fair Barriers to Reach  Goals: Cognitive deficits;Time post onset;Severity of deficits Barriers/Prognosis Comment: suspected Esophageal phase dysmotility; mild Cognitive decline; need for support at meals      Swallow Study   General Date of Onset: 09/08/23 HPI: Pt is a 89 y.o. female with medical history significant for HTN, nocturnal tachycardia on diltiazem, plasmacytoma not on treatment and history of recurrent UTIs frequent falls,  who was brought from [facility] for evaluation of weakness cough and increasing  confusion developing over the past few days.  Daughter [name] daughter said she had had complained of right flank pain.  States that she was placed in ALF 2 weeks prior due to frequent falls and feels like she has not been adjusting well.  Patient who has foot drop and uses a brace and a walker had not been as ambulant as she used to be over the past several weeks and also had a chronic cough and been sitting up in the recliner to sleep.  Since going to the facility they have been putting her in the bed to sleep.  Daughter states that she has overall been declining in recent weeks requiring move to [name](prior to that, pt had been staying w/ the 3 dtrs on different days). Type of Study: Bedside Swallow Evaluation Previous Swallow Assessment: none Diet Prior to this Study: NPO Temperature Spikes Noted: No (wbc 13.0 trending down) Respiratory Status: Nasal cannula (5L) History of Recent Intubation: No Behavior/Cognition: Alert;Cooperative;Pleasant mood;Distractible;Requires cueing (suspected mild Cognitive decline baseline) Oral Cavity Assessment: Within Functional Limits Oral Care Completed by SLP: Recent completion by staff Oral Cavity - Dentition: Dentures, top;Dentures, bottom Vision: Functional for self-feeding Self-Feeding Abilities: Able to feed self;Needs assist;Needs set up;Total assist (weak UEs) Patient Positioning: Upright in bed (needed full positioning support) Baseline Vocal Quality: Normal Volitional Cough: Strong Volitional Swallow: Able to elicit    Oral/Motor/Sensory Function Overall Oral Motor/Sensory Function: Within functional limits   Ice Chips Ice chips: Within functional limits Presentation: Spoon (fed; 3 trials)   Thin Liquid Thin Liquid: Within functional limits (grossly - no immediate overt s/s) Presentation: Cup;Self Fed (10+)    Nectar Thick Nectar Thick Liquid: Not tested   Honey Thick Honey Thick Liquid: Not tested   Puree Puree: Within functional  limits Presentation: Self Fed;Spoon (supported; 10+ trials)   Solid     Solid: Impaired Presentation: Spoon (fed; 7 trials) Oral Phase Impairments: Impaired mastication (lengthy) Oral Phase Functional Implications: Prolonged oral transit;Impaired mastication Pharyngeal Phase Impairments:  (none)        Jerilynn Som, MS, CCC-SLP Speech Language Pathologist Rehab Services; [facility] [phone] (ascom) Uriel Horkey 09/09/2023,2:01 PM

## 2023-09-09 NOTE — Assessment & Plan Note (Addendum)
Pneumonia History of chronic cough, QuantiFERON gold negative on 08/18/2023 Acute respiratory failure with hypoxia Sepsis criteria fever, leukocytosis, hypoxia, infection COVID flu and RSV negative CT negative for pneumonia but showing bibasilar atelectasis and small bilateral partially loculated pleural effusions Sepsis fluids Continue cefepime and azithromycin Antitussives, incentive spirometer, albuterol as needed Supplemental oxygen to keep sats over 92% SLP consult

## 2023-09-09 NOTE — ED Notes (Signed)
Patient incontinent of urine. Patient cleaned and new brief placed. New padding and bed sheet. Patient pulled up in bed and turned to right side

## 2023-09-09 NOTE — Assessment & Plan Note (Signed)
CTA negative for PE

## 2023-09-09 NOTE — Assessment & Plan Note (Signed)
Secondary to sepsis Neurologic checks with fall and aspiration precautions Delirium precautions N.p.o. until safe to swallow

## 2023-09-09 NOTE — Progress Notes (Signed)
Rapid response called to room 220. [name] was c/o chest pain, sternal, 8/10. EKG ordered, Dr. Para March arrived. New PIV obtained, 1 SL NTG given.  Patients BP dropped as per chart and then rebounded. I stayed at bedside until BP returned to baseline. Patient states her pain improved.ABC intact. No distress, no needs.

## 2023-09-09 NOTE — ED Notes (Signed)
Patient care delay due to cc patient

## 2023-09-09 NOTE — Evaluation (Signed)
Physical Therapy Evaluation Patient Details Name: [name] MRN: [id number] DOB: 1929/04/24 Today's Date: 09/09/2023  History of Present Illness  [name] is a 89 year old female with history of HTN, HLD, DVT, plasmacytoma not on treatment presenting to the emergency department for evaluation of weakness and cough.  History obtained primarily from patient's daughter.  She reports that over the past few days she has noticed increased confusion.  She has had a cough, no known sick contacts but recently moved into [facility].  Has a history of recurrent UTIs and has been complaining of flank pain for a few days.  Did have a fever of 101.3 with EMS.  Clinical Impression  Pt is received in bed with daughter at bedside, she is fatigued but agreeable to PT session. At baseline, Pt and daughter report she only amb short distances with RW with most recent mobilization in wheelchair at ALF, and has only worn R foot AFO once due to recently receiving new fit. At beginning of session, Pt reports 8/10 NPS back pain. Pt performs bed mobility max A with poor initiation of task and frequent cuing for hand placement. Unable to assess further mobility secondary to increased fatigue and pain. Educated Pt's daughter in benefits of mobility and bringing AFO for next visit-she verbalized understanding. Pt would benefit from skilled PT to address above deficits and promote optimal return to PLOF.       If plan is discharge home, recommend the following: A lot of help with walking and/or transfers;A lot of help with bathing/dressing/bathroom;Help with stairs or ramp for entrance;Assist for transportation;Supervision due to cognitive status;Assistance with cooking/housework   Can travel by private vehicle   No    Equipment Recommendations Other (comment) (TBD at this time)  Recommendations for Other Services       Functional Status Assessment Patient has had a recent decline in their functional  status and demonstrates the ability to make significant improvements in function in a reasonable and predictable amount of time.     Precautions / Restrictions Precautions Precautions: Fall Precaution Comments: R foot drop Required Braces or Orthoses: Other Brace Other Brace: R AFO Restrictions Weight Bearing Restrictions: No      Mobility  Bed Mobility Overal bed mobility: Needs Assistance Bed Mobility: Supine to Sit     Supine to sit: Max assist     General bed mobility comments: poor initiation of task requiring max A; cuing for hand placement    Transfers Overall transfer level: Needs assistance                 General transfer comment: Unsafe to attempt at this time    Ambulation/Gait               General Gait Details: Unable to perform at this time secondary to fatigue; short distance amb only  Stairs            Wheelchair Mobility     Tilt Bed    Modified Rankin (Stroke Patients Only)       Balance Overall balance assessment: History of Falls, Needs assistance Sitting-balance support: Bilateral upper extremity supported, Feet unsupported Sitting balance-Leahy Scale: Fair Sitting balance - Comments: able to maintain seated balance at EOB with BUE support but required mod A during 1 UE support due to poor core control Postural control: Posterior lean   Standing balance-Leahy Scale: Zero Standing balance comment: Unable to assess at this time  Pertinent Vitals/Pain Pain Assessment Pain Assessment: 0-10 Pain Score: 8  Pain Location: LBP Pain Descriptors / Indicators: Discomfort, Grimacing, Guarding, Aching Pain Intervention(s): Limited activity within patient's tolerance, Monitored during session    Home Living Family/patient expects to be discharged to:: Assisted living                 Home Equipment: Agricultural consultant (2 wheels);Lift chair;Wheelchair Financial trader (4  wheels) Additional Comments: multiple falls in the last 6 months    Prior Function Prior Level of Function : Needs assist;History of Falls (last six months)       Physical Assist : Mobility (physical);ADLs (physical) Mobility (physical): Transfers;Gait ADLs (physical): Feeding;Bathing;Dressing;Toileting;IADLs Mobility Comments: per daughter, pt has leg brace that she has worn 1x. recently using a wc, but several weeks ago used a rollator ADLs Comments: lives at [facility], has assist for all ADLs/IADLs. Family also has hired a Nurse, adult during the day.     Extremity/Trunk Assessment   Upper Extremity Assessment Upper Extremity Assessment: Generalized weakness    Lower Extremity Assessment Lower Extremity Assessment: Generalized weakness       Communication   Communication Communication: Difficulty following commands/understanding Following commands: Follows one step commands with increased time;Follows multi-step commands inconsistently Cueing Techniques: Verbal cues;Tactile cues  Cognition Arousal: Lethargic Behavior During Therapy: Flat affect Overall Cognitive Status: Impaired/Different from baseline Area of Impairment: Following commands, Problem solving                       Following Commands: Follows one step commands inconsistently     Problem Solving: Slow processing, Decreased initiation, Requires verbal cues, Requires tactile cues, Difficulty sequencing General Comments: frequent complaints of being "cold", barely opens eyes to stimuli during session        General Comments      Exercises     Assessment/Plan    PT Assessment Patient needs continued PT services  PT Problem List Decreased strength;Decreased range of motion;Decreased activity tolerance;Decreased balance;Decreased mobility;Decreased coordination;Decreased safety awareness;Pain       PT Treatment Interventions DME instruction;Gait training;Stair training;Functional  mobility training;Therapeutic activities;Therapeutic exercise;Balance training;Neuromuscular re-education    PT Goals (Current goals can be found in the Care Plan section)  Acute Rehab PT Goals Patient Stated Goal: reduce pain PT Goal Formulation: With patient Time For Goal Achievement: 09/23/23 Potential to Achieve Goals: Fair    Frequency Min 1X/week     Co-evaluation               AM-PAC PT "6 Clicks" Mobility  Outcome Measure Help needed turning from your back to your side while in a flat bed without using bedrails?: A Lot Help needed moving from lying on your back to sitting on the side of a flat bed without using bedrails?: A Lot Help needed moving to and from a bed to a chair (including a wheelchair)?: A Lot Help needed standing up from a chair using your arms (e.g., wheelchair or bedside chair)?: A Lot Help needed to walk in hospital room?: A Lot Help needed climbing 3-5 steps with a railing? : Total 6 Click Score: 11    End of Session Equipment Utilized During Treatment: Oxygen Activity Tolerance: Patient limited by fatigue;Patient limited by pain Patient left: in bed;with call bell/phone within reach;with family/visitor present Nurse Communication: Mobility status PT Visit Diagnosis: Unsteadiness on feet (R26.81);Repeated falls (R29.6);Muscle weakness (generalized) (M62.81);History of falling (Z91.81);Pain Pain - Right/Left:  (bilateral) Pain - part of body:  (  back)    Time: 4098-1191 PT Time Calculation (min) (ACUTE ONLY): 11 min   Charges:                 Elmon Else, SPT   Forestine Macho 09/09/2023, 12:42 PM

## 2023-09-09 NOTE — Assessment & Plan Note (Signed)
Creatinine 1.26 up from baseline of 1.0 a couple months prior Expecting improvement with IV hydration Continue to monitor and avoid nephrotoxins Hold home diuretics

## 2023-09-09 NOTE — Progress Notes (Signed)
   09/09/23 2200  Spiritual Encounters  Type of Visit Follow up  Care provided to: Patient  Referral source Patient request  Reason for visit Routine spiritual support  OnCall Visit Yes  Interventions  Spiritual Care Interventions Made Reflective listening;Prayer  Intervention Outcomes  Outcomes Reduced anxiety   Chaplain responded to patient request for presence and prayer. Patient expressed her desire "to go be with Jesus."

## 2023-09-09 NOTE — Progress Notes (Signed)
Pharmacy Antibiotic Note  [name] is a 89 y.o. female admitted on 09/08/2023 with pneumonia/CAP.  Pharmacy has been consulted for Cefepime & Vancomycin dosing for 5 days.  Plan: Cefepime 2 gm q24hr per indication & renal fxn.  Pt given Vancomycin 1500 mg once. Vancomycin 1250 mg IV Q 48 hrs. Goal AUC 400-550. Expected AUC: 521.1 SCr used: 1.27  Pharmacy will continue to follow and will adjust abx dosing whenever warranted.  Temp (24hrs), Avg:98.9 F (37.2 C), Min:98.9 F (37.2 C), Max:98.9 F (37.2 C)   Recent Labs  Lab 09/08/23 1915 09/08/23 2034  WBC 14.9*  --   CREATININE 1.27*  --   LATICACIDVEN  --  1.8    Estimated Creatinine Clearance: 25.4 mL/min (A) (by C-G formula based on SCr of 1.27 mg/dL (H)).    Allergies  Allergen Reactions   Doxycycline Hyclate Nausea Only   Levofloxacin Nausea And Vomiting   Sulfa Antibiotics Rash    Other Reaction: Throat feels funny    Antimicrobials this admission: 9/16 Azithromycin >> x 1 dose 9/16 Cefepime >> x 5 days 9/17 Vancomycin >> x 5 days  Microbiology results: 9/16 BCx: Pending  Thank you for allowing pharmacy to be a part of this patient's care.  Otelia Sergeant, PharmD, [facility] 09/09/2023 2:23 AM

## 2023-09-09 NOTE — Assessment & Plan Note (Signed)
In remission.  Followed at [facility]

## 2023-09-09 NOTE — Assessment & Plan Note (Signed)
High-grade stenosis proximal left subclavian artery Incidental finding Given age and frailty likely not amenable to intervention but can consider discussing with vascular

## 2023-09-09 NOTE — Assessment & Plan Note (Addendum)
Hypochloremia Possibly related to thiazide diuretic, mild dehydration from poor oral intake since moving into assisted living 2 weeks prior  IV hydration with NS and monitor Hold diuretic

## 2023-09-09 NOTE — ED Notes (Signed)
Placed call to lab to collect labs for patients

## 2023-09-09 NOTE — H&P (Addendum)
History and Physical    Patient: Amy [name] ZOX:[id number] DOB: Mar 22, 1929 DOA: 09/08/2023 DOS: the patient was seen and examined on 09/09/2023 PCP: Danella Penton, MD  Patient coming from: ALF/ILF  Chief Complaint:  Chief Complaint  Patient presents with   Weakness   Fever    HPI: [name] is a 89 y.o. female with medical history significant for HTN, nocturnal tachycardia on diltiazem, plasmacytoma not on treatment and history of recurrent UTIs frequent falls,  who was brought from [facility] for evaluation of weakness cough and increasing confusion developing over the past few days.  Daughter Amy [name] daughter said she had had complained of right flank pain.  States that she was placed in ALF 2 weeks prior due to frequent falls and feels like she has not been adjusting well.  Patient who has foot drop and uses a brace and a walker had not been as ambulant as she used to be over the past several weeks and also had a chronic cough and been sitting up in the recliner to sleep.  Since going to the facility they have been putting her in the bed to sleep.  States that she has overall been declining. Was febrile with EMS at 101.3.  Arrived on 4 L satting around 91%.   ED course and data review: Afebrile on arrival with pulse 126 , O2 sat 91-93% on 4 L, BP 127/75. Labs: WBC 15,000 and lactic acid 1.8.  Negative COVID flu and RSV Sodium 130 and chloride 88 Creatinine 1.27 up from 1.0 a couple months prior.  Noted to have an anion gap of 18.  Normal bicarb. Ua not done. EKG, personally viewed and interpreted showing sinus tachycardia at 120 with no acute ST-T wave changes. CTA chest, abdomen and pelvis essentially nonacute but shows high-grade stenosis proximal left subclavian artery.  Details outlined below: IMPRESSION: Chest:  1. No evidence of pulmonary embolus. 2. Bibasilar atelectasis, with small bilateral partially loculated pleural effusions. 3. Large hiatal hernia. 4.  Nonspecific subcarinal lymphadenopathy. 5. Aortic Atherosclerosis (ICD10-I70.0), with focal high-grade stenosis within the proximal left subclavian artery estimated 70-90%.   Abdomen/pelvis:  1. No acute intra-abdominal or intrapelvic process. 2. Sigmoid diverticulosis without diverticulitis. 3.  Aortic Atherosclerosis (ICD10-I70.0).    CT head nonacute  Patient was started on cefepime and azithromycin for suspected pneumonia and given sepsis fluids Hospitalist consulted for admission.     Past Medical History:  Diagnosis Date   Arthritis    Depression    Diverticulitis    DVT (deep venous thrombosis) (HCC)    Right leg    Dyspnea    Family history of adverse reaction to anesthesia    PONV sister   GERD (gastroesophageal reflux disease)    History of hiatal hernia    Hyperlipidemia    Hypertension    Mitral valve regurgitation    Neuromuscular disorder (HCC)    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HCC)    Pneumonia    Renal disorder    Past Surgical History:  Procedure Laterality Date   ABDOMINAL HYSTERECTOMY     partial   ABDOMINAL SURGERY     bladder tack  1990   BREAST BIOPSY  1974   CATARACT EXTRACTION, BILATERAL     CERVICAL SPINE SURGERY     tumor removed   HERNIA REPAIR     KYPHOPLASTY N/A 03/12/2018   Procedure: [id number];  Surgeon: Kennedy Bucker, MD;  Location: [facility] ORS;  Service: Orthopedics;  Laterality: N/A;   KYPHOPLASTY N/A  06/30/2018   Procedure: Shauna Hugh;  Surgeon: Kennedy Bucker, MD;  Location: [facility] ORS;  Service: Orthopedics;  Laterality: N/A;   plasmacytoma resection  2005   lumbar   Social History:  reports that she has never smoked. She has never used smokeless tobacco. She reports that she does not drink alcohol and does not use drugs.  Allergies  Allergen Reactions   Doxycycline Hyclate Nausea Only   Levofloxacin Nausea And Vomiting   Sulfa Antibiotics Rash    Other Reaction: Throat feels funny    Family History  Problem Relation Age of Onset    Heart failure Mother    Heart failure Father    COPD Sister    Prostate cancer Brother    COPD Brother     Prior to Admission medications   Medication Sig Start Date End Date Taking? Authorizing Provider  acetaminophen-codeine (TYLENOL #3) 300-30 MG tablet Take 1 tablet by mouth every 12 (twelve) hours as needed for severe pain. Patient not taking: No sig reported 09/15/20   Rickard Patience, MD  alendronate (FOSAMAX) 70 MG tablet Take 70 mg by mouth once a week. Sunday Patient not taking: No sig reported 06/01/18   [provider]  aspirin EC 81 MG tablet Take 1 tablet by mouth daily.    [provider]  cephALEXin (KEFLEX) 500 MG capsule Take 1 capsule (500 mg total) by mouth 2 (two) times daily. 04/18/21   Ward, Layla Maw, DO  Cholecalciferol (VITAMIN D) 50 MCG (2000 UT) CAPS Take 1 capsule by mouth daily.    [provider]  cyclobenzaprine (FLEXERIL) 5 MG tablet Take 5-10 mg by mouth 3 (three) times daily as needed for muscle spasms. Patient not taking: No sig reported    [provider]  diphenhydrAMINE (BENADRYL) 25 MG tablet Take 25 mg by mouth at bedtime.    [provider]  DULoxetine (CYMBALTA) 60 MG capsule Take 60 mg by mouth daily.    [provider]  esomeprazole (NEXIUM) 40 MG capsule Take by mouth. 07/11/20 07/11/21  [provider]  ipratropium (ATROVENT) 0.03 % nasal spray Place 2 sprays into the nose 2 (two) times daily as needed. 02/07/21 02/07/22  [provider]  losartan (COZAAR) 50 MG tablet Take 25 mg by mouth daily.  Patient not taking: No sig reported    [provider]  Melatonin 10 MG CAPS Take by mouth at bedtime as needed. Patient not taking: No sig reported    [provider]  Multiple Vitamin (MULTIVITAMIN WITH MINERALS) TABS tablet Take 1 tablet by mouth daily.    [provider]  Multiple Vitamins-Minerals (PRESERVISION AREDS PO) Take by mouth.    [provider]  Omega-3 Fatty Acids (FISH OIL) 1000 MG CAPS Take 2 capsules by mouth daily.     [provider]  ondansetron (ZOFRAN) 8 MG tablet Take 1 tablet (8 mg total) by mouth every 8 (eight) hours as needed for nausea or vomiting. Patient not taking: No sig reported 10/31/20   Carmina Miller, MD  PARoxetine (PAXIL) 20 MG tablet Take 20 mg by mouth daily. 03/26/21   [provider]  Polyvinyl Alcohol-Povidone (REFRESH OP) Apply 1-2 drops to eye as needed (dry eyes).  Patient not taking: No sig reported    [provider]  promethazine (PHENERGAN) 12.5 MG tablet Take 1 tablet (12.5 mg total) by mouth every 6 (six) hours as needed for nausea or vomiting. Patient not taking: No sig reported 11/12/20  Cuthriell, Delorise Royals, PA-C  triamterene-hydrochlorothiazide (DYAZIDE) 37.5-25 MG capsule Take 1 capsule by mouth daily.    [provider]    Physical Exam: Vitals:   09/08/23 2130 09/08/23 2145 09/08/23 2200 09/08/23 2215  BP:      Pulse: 100 95 91 87  Resp: (!) 25 (!) 23 (!) 21 (!) 22  Temp:      TempSrc:      SpO2: 91% 93% 93% 93%  Weight:       Physical Exam Vitals and nursing note reviewed.  Constitutional:      Interventions: Nasal cannula in place.     Comments: Frail-appearing elderly female with a congested nonproductive cough, increased work of breathing  HENT:     Head: Normocephalic and atraumatic.  Cardiovascular:     Rate and Rhythm: Regular rhythm. Tachycardia present.     Heart sounds: Normal heart sounds.  Pulmonary:     Effort: Tachypnea present.     Breath sounds: Normal breath sounds.     Comments: Increased work of breathing Abdominal:     Palpations: Abdomen is soft.     Tenderness: There is no abdominal tenderness.  Neurological:     Mental Status: Mental status is at baseline.     Labs on Admission: I have personally reviewed following labs and imaging studies  CBC: Recent Labs  Lab 09/08/23 1915  WBC 14.9*  NEUTROABS  12.3*  HGB 13.8  HCT 45.9  MCV 95.4  PLT 250   Basic Metabolic Panel: Recent Labs  Lab 09/08/23 1915  NA 130*  K 4.0  CL 88*  CO2 24  GLUCOSE 176*  BUN 32*  CREATININE 1.27*  CALCIUM 8.9   GFR: CrCl cannot be calculated (Unknown ideal weight.). Liver Function Tests: Recent Labs  Lab 09/08/23 1915  AST 16  ALT 19  ALKPHOS 139*  BILITOT 1.1  PROT 7.1  ALBUMIN 3.0*   No results for input(s): "LIPASE", "AMYLASE" in the last 168 hours. No results for input(s): "AMMONIA" in the last 168 hours. Coagulation Profile: Recent Labs  Lab 09/08/23 2034  INR 1.2   Cardiac Enzymes: No results for input(s): "CKTOTAL", "CKMB", "CKMBINDEX", "TROPONINI" in the last 168 hours. BNP (last 3 results) No results for input(s): "PROBNP" in the last 8760 hours. HbA1C: No results for input(s): "HGBA1C" in the last 72 hours. CBG: No results for input(s): "GLUCAP" in the last 168 hours. Lipid Profile: No results for input(s): "CHOL", "HDL", "LDLCALC", "TRIG", "CHOLHDL", "LDLDIRECT" in the last 72 hours. Thyroid Function Tests: No results for input(s): "TSH", "T4TOTAL", "FREET4", "T3FREE", "THYROIDAB" in the last 72 hours. Anemia Panel: No results for input(s): "VITAMINB12", "FOLATE", "FERRITIN", "TIBC", "IRON", "RETICCTPCT" in the last 72 hours. Urine analysis:    Component Value Date/Time   COLORURINE YELLOW (A) 04/18/2021 0225   APPEARANCEUR HAZY (A) 04/18/2021 0225   LABSPEC 1.021 04/18/2021 0225   PHURINE 5.0 04/18/2021 0225   GLUCOSEU NEGATIVE 04/18/2021 0225   HGBUR NEGATIVE 04/18/2021 0225   BILIRUBINUR NEGATIVE 04/18/2021 0225   BILIRUBINUR neg 05/13/2016 1055   KETONESUR 5 (A) 04/18/2021 0225   PROTEINUR NEGATIVE 04/18/2021 0225   UROBILINOGEN 0.2 05/13/2016 1055   NITRITE POSITIVE (A) 04/18/2021 0225   LEUKOCYTESUR LARGE (A) 04/18/2021 0225    Radiological Exams on Admission: CT Head Wo Contrast  Result Date: 09/08/2023 CLINICAL DATA:  Mental status changes,  unknown cause. Weakness, coughing and abdominal and back pain. EXAM: CT HEAD WITHOUT CONTRAST TECHNIQUE: Contiguous axial images were obtained from  the base of the skull through the vertex without intravenous contrast. RADIATION DOSE REDUCTION: This exam was performed according to the departmental dose-optimization program which includes automated exposure control, adjustment of the mA and/or kV according to patient size and/or use of iterative reconstruction technique. COMPARISON:  Head CT 04/18/2021 FINDINGS: Brain: There is moderately advanced cerebral atrophy with atrophic ventriculomegaly and mild-to-moderate small vessel disease of the cerebral white matter. Relatively mild cerebellar atrophy. There is no midline shift. There is no new asymmetry concerning for an acute infarct, hemorrhage, mass or mass effect. Basal cisterns are clear. Vascular: There are calcifications in the distal vertebral arteries and siphons. No hyperdense central vessel is seen. Skull: Negative for fractures or focal lesions. Sinuses/Orbits: Clear sinuses with midline nasal septum. Negative orbits apart from old lens replacements. Prior diffuse right mastoid effusion has resolved in the interval. Still seen is moderate patchy fluid in the mid to lower left mastoid air cells. Both middle ears are clear. Other: A multilevel dorsal cervical spine fusion construct is partially visible. This was seen previously. IMPRESSION: 1. No acute intracranial CT findings or interval changes. 2. Chronic changes. 3. Continued left mastoid effusion with interval resolution of prior right mastoid effusion. Electronically Signed   By: Almira Bar M.D.   On: 09/08/2023 21:56   CT Angio Chest PE W/Cm &/Or Wo Cm  Result Date: 09/08/2023 CLINICAL DATA:  Recurrent urinary tract infection, right flank pain, cough, weakness, back pain EXAM: CT ANGIOGRAPHY CHEST CT ABDOMEN AND PELVIS WITH CONTRAST TECHNIQUE: Multidetector CT imaging of the chest was  performed using the standard protocol during bolus administration of intravenous contrast. Multiplanar CT image reconstructions and MIPs were obtained to evaluate the vascular anatomy. Multidetector CT imaging of the abdomen and pelvis was performed using the standard protocol during bolus administration of intravenous contrast. RADIATION DOSE REDUCTION: This exam was performed according to the departmental dose-optimization program which includes automated exposure control, adjustment of the mA and/or kV according to patient size and/or use of iterative reconstruction technique. CONTRAST:  75mL OMNIPAQUE IOHEXOL 350 MG/ML SOLN COMPARISON:  09/08/2023, 12/17/2016, 09/12/2016 FINDINGS: CTA CHEST FINDINGS Cardiovascular: This is a technically adequate evaluation of the pulmonary vasculature. No filling defects or pulmonary emboli. Heart is unremarkable without pericardial effusion. No evidence of thoracic aortic aneurysm or dissection. There is atherosclerosis of the aorta and coronary vasculature. There is high-grade stenosis within the proximal left subclavian artery, estimated 70-90%. Mediastinum/Nodes: Nonspecific subcarinal adenopathy measuring up to 14 mm in short axis. No other pathologically enlarged lymph nodes. Thyroid, trachea, and esophagus are unremarkable. Large hiatal hernia. Lungs/Pleura: Dense areas of linear consolidation at the lung bases with associated volume loss consistent with atelectasis. There are trace bilateral partially loculated pleural effusions. No pneumothorax. Central airways are patent. Musculoskeletal: Prior healed right rib fractures. Chronic T11 and T12 compression deformities with evidence of prior vertebral augmentation. No acute bony abnormalities. Reconstructed images demonstrate no additional findings. Review of the MIP images confirms the above findings. CT ABDOMEN and PELVIS FINDINGS Hepatobiliary: Gallbladder is surgically absent, with likely postsurgical dilatation of the  common bile duct measuring up to 14 mm. No evidence of choledocholithiasis. The liver is unremarkable. Pancreas: Unremarkable. No pancreatic ductal dilatation or surrounding inflammatory changes. Spleen: Normal in size without focal abnormality. Adrenals/Urinary Tract: No acute or significant renal findings. No urinary tract calculi or obstruction. Chronic 4 mm parenchymal calcification upper pole right kidney. The adrenals and bladder are unremarkable. Stomach/Bowel: No bowel obstruction or ileus. Appendix is likely surgically  absent. Diverticulosis of the sigmoid colon without evidence of diverticulitis. No bowel wall thickening or inflammatory change. Vascular/Lymphatic: Aortic atherosclerosis. No enlarged abdominal or pelvic lymph nodes. Reproductive: Status post hysterectomy. No adnexal masses. Other: No free fluid or free intraperitoneal gas. No abdominal wall hernia. Musculoskeletal: No acute or destructive bony abnormalities. Severe bilateral hip osteoarthritis. Reconstructed images demonstrate no additional findings. Review of the MIP images confirms the above findings. IMPRESSION: Chest: 1. No evidence of pulmonary embolus. 2. Bibasilar atelectasis, with small bilateral partially loculated pleural effusions. 3. Large hiatal hernia. 4. Nonspecific subcarinal lymphadenopathy. 5. Aortic Atherosclerosis (ICD10-I70.0), with focal high-grade stenosis within the proximal left subclavian artery estimated 70-90%. Abdomen/pelvis: 1. No acute intra-abdominal or intrapelvic process. 2. Sigmoid diverticulosis without diverticulitis. 3.  Aortic Atherosclerosis (ICD10-I70.0). Electronically Signed   By: Sharlet Salina M.D.   On: 09/08/2023 21:49   CT ABDOMEN PELVIS W CONTRAST  Result Date: 09/08/2023 CLINICAL DATA:  Recurrent urinary tract infection, right flank pain, cough, weakness, back pain EXAM: CT ANGIOGRAPHY CHEST CT ABDOMEN AND PELVIS WITH CONTRAST TECHNIQUE: Multidetector CT imaging of the chest was  performed using the standard protocol during bolus administration of intravenous contrast. Multiplanar CT image reconstructions and MIPs were obtained to evaluate the vascular anatomy. Multidetector CT imaging of the abdomen and pelvis was performed using the standard protocol during bolus administration of intravenous contrast. RADIATION DOSE REDUCTION: This exam was performed according to the departmental dose-optimization program which includes automated exposure control, adjustment of the mA and/or kV according to patient size and/or use of iterative reconstruction technique. CONTRAST:  75mL OMNIPAQUE IOHEXOL 350 MG/ML SOLN COMPARISON:  09/08/2023, 12/17/2016, 09/12/2016 FINDINGS: CTA CHEST FINDINGS Cardiovascular: This is a technically adequate evaluation of the pulmonary vasculature. No filling defects or pulmonary emboli. Heart is unremarkable without pericardial effusion. No evidence of thoracic aortic aneurysm or dissection. There is atherosclerosis of the aorta and coronary vasculature. There is high-grade stenosis within the proximal left subclavian artery, estimated 70-90%. Mediastinum/Nodes: Nonspecific subcarinal adenopathy measuring up to 14 mm in short axis. No other pathologically enlarged lymph nodes. Thyroid, trachea, and esophagus are unremarkable. Large hiatal hernia. Lungs/Pleura: Dense areas of linear consolidation at the lung bases with associated volume loss consistent with atelectasis. There are trace bilateral partially loculated pleural effusions. No pneumothorax. Central airways are patent. Musculoskeletal: Prior healed right rib fractures. Chronic T11 and T12 compression deformities with evidence of prior vertebral augmentation. No acute bony abnormalities. Reconstructed images demonstrate no additional findings. Review of the MIP images confirms the above findings. CT ABDOMEN and PELVIS FINDINGS Hepatobiliary: Gallbladder is surgically absent, with likely postsurgical dilatation of the  common bile duct measuring up to 14 mm. No evidence of choledocholithiasis. The liver is unremarkable. Pancreas: Unremarkable. No pancreatic ductal dilatation or surrounding inflammatory changes. Spleen: Normal in size without focal abnormality. Adrenals/Urinary Tract: No acute or significant renal findings. No urinary tract calculi or obstruction. Chronic 4 mm parenchymal calcification upper pole right kidney. The adrenals and bladder are unremarkable. Stomach/Bowel: No bowel obstruction or ileus. Appendix is likely surgically absent. Diverticulosis of the sigmoid colon without evidence of diverticulitis. No bowel wall thickening or inflammatory change. Vascular/Lymphatic: Aortic atherosclerosis. No enlarged abdominal or pelvic lymph nodes. Reproductive: Status post hysterectomy. No adnexal masses. Other: No free fluid or free intraperitoneal gas. No abdominal wall hernia. Musculoskeletal: No acute or destructive bony abnormalities. Severe bilateral hip osteoarthritis. Reconstructed images demonstrate no additional findings. Review of the MIP images confirms the above findings. IMPRESSION: Chest: 1. No evidence of pulmonary  embolus. 2. Bibasilar atelectasis, with small bilateral partially loculated pleural effusions. 3. Large hiatal hernia. 4. Nonspecific subcarinal lymphadenopathy. 5. Aortic Atherosclerosis (ICD10-I70.0), with focal high-grade stenosis within the proximal left subclavian artery estimated 70-90%. Abdomen/pelvis: 1. No acute intra-abdominal or intrapelvic process. 2. Sigmoid diverticulosis without diverticulitis. 3.  Aortic Atherosclerosis (ICD10-I70.0). Electronically Signed   By: Sharlet Salina M.D.   On: 09/08/2023 21:49   DG Chest Port 1 View  Result Date: 09/08/2023 CLINICAL DATA:  Sepsis, weakness, cough EXAM: PORTABLE CHEST 1 VIEW COMPARISON:  11/12/2020 FINDINGS: Single frontal view of the chest demonstrates an unremarkable cardiac silhouette. There is bibasilar consolidation and small  bilateral pleural effusions, left greater than right. No pneumothorax. No acute bony abnormalities. IMPRESSION: 1. Bibasilar consolidation and small bilateral pleural effusions, left greater than right. This could reflect underlying atelectasis or pneumonia. Electronically Signed   By: Sharlet Salina M.D.   On: 09/08/2023 20:51     Data Reviewed: Relevant notes from primary care and specialist visits, past discharge summaries as available in EHR, including Care Everywhere. Prior diagnostic testing as pertinent to current admission diagnoses Updated medications and problem lists for reconciliation ED course, including vitals, labs, imaging, treatment and response to treatment Triage notes, nursing and pharmacy notes and ED provider's notes Notable results as noted in HPI   Assessment and Plan: * Sepsis (HCC) Pneumonia History of chronic cough, QuantiFERON gold negative on 08/18/2023 Acute respiratory failure with hypoxia Sepsis criteria fever, leukocytosis, hypoxia, infection COVID flu and RSV negative CT negative for pneumonia but showing bibasilar atelectasis and small bilateral partially loculated pleural effusions Sepsis fluids Continue cefepime and azithromycin Antitussives, incentive spirometer, albuterol as needed Supplemental oxygen to keep sats over 92% SLP consult  Acute metabolic encephalopathy Secondary to sepsis Neurologic checks with fall and aspiration precautions Delirium precautions N.p.o. until safe to swallow  Right flank pain History of recurrent UTIs CT abdomen and pelvis with no acute findings Follow-up [facility]  Acute renal failure superimposed on stage 3a chronic kidney disease (HCC) Creatinine 1.26 up from baseline of 1.0 a couple months prior Expecting improvement with IV hydration Continue to monitor and avoid nephrotoxins Hold home diuretics  Hyponatremia Hypochloremia Possibly related to thiazide diuretic, mild dehydration from poor oral intake since  moving into assisted living 2 weeks prior  IV hydration with NS and monitor Hold diuretic  Frailty Frequent falls Foot drop/uses brace and ambulate walker Daughter states that patient was falling a lot and they made a decision to place her in an ALF but states she has not been adjusting well and has poor appetite as she does not like the food PT OT consult and nutritionist eval  Chronic tachycardia Patient has nocturnal tachycardia and recently had her metoprolol switched to diltiazem by PCP Continue diltiazem  Stenosis of left subclavian artery (HCC) High-grade stenosis proximal left subclavian artery Incidental finding Given age and frailty likely not amenable to intervention but can consider discussing with vascular  Hx of deep vein thrombophlebitis of lower extremity CTA negative for PE  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HCC) In remission.  Followed at [facility]    CAD in native artery Continue aspirin, losartan     DVT prophylaxis: Lovenox  Consults: none  Advance Care Planning: DNR  Family Communication: Daughter, Carlean Jews on phone and Gerre Scull at bedside  Disposition Plan: Back to previous home environment  Severity of Illness: The appropriate patient status for this patient is INPATIENT. Inpatient status is judged to be reasonable and necessary in order to provide  the required intensity of service to ensure the patient's safety. The patient's presenting symptoms, physical exam findings, and initial radiographic and laboratory data in the context of their chronic comorbidities is felt to place them at high risk for further clinical deterioration. Furthermore, it is not anticipated that the patient will be medically stable for discharge from the hospital within 2 midnights of admission.   * I certify that at the point of admission it is my clinical judgment that the patient will require inpatient hospital care spanning beyond 2 midnights from the point of admission due to high  intensity of service, high risk for further deterioration and high frequency of surveillance required.*  Author: Andris Baumann, MD 09/09/2023 1:01 AM  For on call review [email or web address].

## 2023-09-09 NOTE — Progress Notes (Signed)
   09/09/23 2100  Spiritual Encounters  Type of Visit Initial  Care provided to: Patient  Conversation partners present during encounter Nurse  Referral source Code page  Reason for visit Code  OnCall Visit Yes   Chaplain responded to Rapid Response code. Patient asked for Chaplain to return.

## 2023-09-09 NOTE — Assessment & Plan Note (Signed)
Patient has nocturnal tachycardia and recently had her metoprolol switched to diltiazem by PCP Continue diltiazem

## 2023-09-09 NOTE — Assessment & Plan Note (Signed)
History of recurrent UTIs CT abdomen and pelvis with no acute findings Follow-up [facility]

## 2023-09-09 NOTE — Assessment & Plan Note (Signed)
Frequent falls Foot drop/uses brace and ambulate walker Daughter states that patient was falling a lot and they made a decision to place her in an ALF but states she has not been adjusting well and has poor appetite as she does not like the food PT OT consult and nutritionist eval

## 2023-09-09 NOTE — Evaluation (Signed)
Occupational Therapy Evaluation Patient Details Name: Amy [name] MRN: [id number] DOB: 03-22-1929 Today's Date: 09/09/2023   History of Present Illness Jontavia Renesmay Attard is a 89 year old female with history of HTN, HLD, DVT, plasmacytoma not on treatment presenting to the emergency department for evaluation of weakness and cough.  History obtained primarily from patient's daughter.  She reports that over the past few days she has noticed increased confusion.  She has had a cough, no known sick contacts but recently moved into [facility].  Has a history of recurrent UTIs and has been complaining of flank pain for a few days.  Did have a fever of 101.3 with EMS.   Clinical Impression   Pt seen for OT eval this date, daughter present. RN notified of patient's c/o 8/10 pain in back. Pt A&Ox4. PTA, pt recently moved to [facility]F and has required assist for ADLs/mobility. Family also has hired a Nurse, adult to help. Pt used a rollator, but recently has been using a wheelchair. Does have a brace for chronic foot drop that has been used 1x previously.  Daughter endorses significant changes in patient's physical and cognitive status. Pt very fatigued on eval, asleep and difficult to awaken to stimuli. Assessment limited due to pt's inability to follow commands consistently. Attempted to sit pt EOB, but pt difficulties staying awake and required MAX cuing/assist to initiate movement. Once movement initiated, pt immediately places in original spot. Presents with generalized weakness, impaired balance, decreased tolerance to activity, decreased level of arousal and impaired cognition. Pt will require MAX-TOTAL A for all ADLs. Patient will benefit from acute OT to increase overall independence in the areas of ADLs and functional mobility in order to safely discharge.       If plan is discharge home, recommend the following: A lot of help with walking and/or transfers;A lot of help with  bathing/dressing/bathroom;Assistance with cooking/housework;Assistance with feeding;Direct supervision/assist for medications management;Direct supervision/assist for financial management;Help with stairs or ramp for entrance;Assist for transportation;Supervision due to cognitive status    Functional Status Assessment  Patient has had a recent decline in their functional status and demonstrates the ability to make significant improvements in function in a reasonable and predictable amount of time.  Equipment Recommendations  None recommended by OT    Recommendations for Other Services Other (comment)     Precautions / Restrictions Precautions Precautions: Fall Restrictions Weight Bearing Restrictions: No      Mobility Bed Mobility Overal bed mobility: Needs Assistance Bed Mobility: Sidelying to Sit   Sidelying to sit: Max assist       General bed mobility comments: poor participation due to fatigue    Transfers Overall transfer level: Needs assistance                 General transfer comment: Unsafe to attempt      Balance Overall balance assessment: History of Falls                                         ADL either performed or assessed with clinical judgement   ADL Overall ADL's : Needs assistance/impaired                                       General ADL Comments: Assessment limited on eval due  to patient's lethargy. Anticipate that pt requires MAX - TOTAL A for ADL participation at this time. Pt required physical assist to place BUE functionally to initate bed mobility, and frequent cues/physical assist to attempt to bring legs towards EOB. Pt initated movement, but immediatly placed leg back in original place. On 5L O2 - pt does not use O2 at baseline.      Pertinent Vitals/Pain Pain Assessment Pain Assessment: 0-10 Pain Score: 8  Pain Location: LBP Pain Descriptors / Indicators: Discomfort, Grimacing, Guarding,  Aching Pain Intervention(s): RN gave pain meds during session, Limited activity within patient's tolerance, Monitored during session     Extremity/Trunk Assessment Upper Extremity Assessment Upper Extremity Assessment: Generalized weakness   Lower Extremity Assessment Lower Extremity Assessment: Defer to PT evaluation;Generalized weakness       Communication Communication Communication: Difficulty following commands/understanding Following commands: Follows one step commands inconsistently Cueing Techniques: Verbal cues;Tactile cues   Cognition Arousal: Lethargic Behavior During Therapy: Flat affect Overall Cognitive Status: Impaired/Different from baseline Area of Impairment: Following commands, Problem solving                       Following Commands: Follows one step commands inconsistently     Problem Solving: Slow processing, Decreased initiation, Requires verbal cues, Requires tactile cues, Difficulty sequencing General Comments: Pt lethargic during OT eval. Complaints of being "cold", barely opens eyes to stimuli during session                Home Living Family/patient expects to be discharged to:: Assisted living                             Home Equipment: Rolling Walker (2 wheels);Lift chair;Wheelchair Financial trader (4 wheels)   Additional Comments: 4+ falls in the last year      Prior Functioning/Environment Prior Level of Function : Needs assist;History of Falls (last six months)             Mobility Comments: per daughter, pt has leg brace that she has worn 1x. recently using a wc, but several weeks ago used a rollator ADLs Comments: lives at [facility], has assist for all ADLs/IADLs. Family also has hired a Nurse, adult during the day.        OT Problem List: Decreased strength;Decreased range of motion;Decreased activity tolerance;Impaired balance (sitting and/or standing);Decreased coordination;Decreased  cognition;Decreased safety awareness;Cardiopulmonary status limiting activity;Pain      OT Treatment/Interventions: Therapeutic exercise;Self-care/ADL training;DME and/or AE instruction;Energy conservation;Therapeutic activities;Patient/family education;Balance training    OT Goals(Current goals can be found in the care plan section) Acute Rehab OT Goals OT Goal Formulation: Patient unable to participate in goal setting Time For Goal Achievement: 09/23/23 Potential to Achieve Goals: Fair  OT Frequency: Min 1X/week       AM-PAC OT "6 Clicks" Daily Activity     Outcome Measure Help from another person eating meals?: A Little Help from another person taking care of personal grooming?: A Lot Help from another person toileting, which includes using toliet, bedpan, or urinal?: Total Help from another person bathing (including washing, rinsing, drying)?: Total Help from another person to put on and taking off regular upper body clothing?: Total Help from another person to put on and taking off regular lower body clothing?: Total 6 Click Score: 9   End of Session Equipment Utilized During Treatment: Oxygen Nurse Communication: Patient requests pain meds  Activity Tolerance: Patient limited  by fatigue Patient left: in bed;with call bell/phone within reach;with family/visitor present  OT Visit Diagnosis: Unsteadiness on feet (R26.81);History of falling (Z91.81);Muscle weakness (generalized) (M62.81)                Time: 8413-2440 OT Time Calculation (min): 46 min Charges:  OT General Charges $OT Visit: 1 Visit OT Evaluation $OT Eval Low Complexity: 1 Low OT Treatments $Self Care/Home Management : 8-22 mins  Tida Saner L. Barbi Kumagai, OTR/L  09/09/23, 11:49 AM

## 2023-09-10 ENCOUNTER — Inpatient Hospital Stay: Payer: Medicare Other

## 2023-09-10 DIAGNOSIS — N179 Acute kidney failure, unspecified: Secondary | ICD-10-CM | POA: Diagnosis not present

## 2023-09-10 DIAGNOSIS — A419 Sepsis, unspecified organism: Secondary | ICD-10-CM

## 2023-09-10 DIAGNOSIS — R6520 Severe sepsis without septic shock: Secondary | ICD-10-CM | POA: Diagnosis not present

## 2023-09-10 DIAGNOSIS — J189 Pneumonia, unspecified organism: Secondary | ICD-10-CM | POA: Diagnosis not present

## 2023-09-10 MED ORDER — IOHEXOL 350 MG/ML SOLN
75.0000 mL | Freq: Once | INTRAVENOUS | Status: AC | PRN
  Administered 2023-09-10: 75 mL via INTRAVENOUS

## 2023-09-10 MED ORDER — FUROSEMIDE 10 MG/ML IJ SOLN
20.0000 mg | Freq: Once | INTRAMUSCULAR | Status: AC
  Administered 2023-09-10: 20 mg via INTRAVENOUS
  Filled 2023-09-10: qty 4

## 2023-09-10 MED ORDER — VANCOMYCIN HCL 750 MG/150ML IV SOLN
750.0000 mg | INTRAVENOUS | Status: DC
  Administered 2023-09-10: 750 mg via INTRAVENOUS
  Filled 2023-09-10 (×2): qty 150

## 2023-09-10 MED ORDER — ENOXAPARIN SODIUM 40 MG/0.4ML IJ SOSY
40.0000 mg | PREFILLED_SYRINGE | INTRAMUSCULAR | Status: DC
  Administered 2023-09-10 – 2023-09-11 (×2): 40 mg via SUBCUTANEOUS
  Filled 2023-09-10 (×2): qty 0.4

## 2023-09-10 MED ORDER — MELATONIN 5 MG PO TABS
2.5000 mg | ORAL_TABLET | Freq: Every day | ORAL | Status: DC
  Administered 2023-09-10 – 2023-09-11 (×3): 2.5 mg via ORAL
  Filled 2023-09-10 (×3): qty 1

## 2023-09-10 MED ORDER — DILTIAZEM HCL ER COATED BEADS 120 MG PO CP24
120.0000 mg | ORAL_CAPSULE | Freq: Two times a day (BID) | ORAL | Status: DC
  Administered 2023-09-10 – 2023-09-12 (×4): 120 mg via ORAL
  Filled 2023-09-10 (×4): qty 1

## 2023-09-10 MED ORDER — SODIUM CHLORIDE 0.9 % IV SOLN
2.0000 g | INTRAVENOUS | Status: DC
  Administered 2023-09-10 – 2023-09-11 (×2): 2 g via INTRAVENOUS
  Filled 2023-09-10 (×2): qty 20

## 2023-09-10 MED ORDER — SODIUM CHLORIDE 0.9 % IV SOLN
2.0000 g | Freq: Two times a day (BID) | INTRAVENOUS | Status: DC
  Administered 2023-09-10: 2 g via INTRAVENOUS
  Filled 2023-09-10: qty 12.5

## 2023-09-10 MED ORDER — HYDROXYZINE HCL 25 MG PO TABS
25.0000 mg | ORAL_TABLET | Freq: Three times a day (TID) | ORAL | Status: DC | PRN
  Administered 2023-09-10 – 2023-09-11 (×3): 25 mg via ORAL
  Filled 2023-09-10 (×3): qty 1

## 2023-09-10 MED ORDER — PREDNISONE 20 MG PO TABS
40.0000 mg | ORAL_TABLET | Freq: Every day | ORAL | Status: DC
  Administered 2023-09-10 – 2023-09-12 (×3): 40 mg via ORAL
  Filled 2023-09-10 (×3): qty 2

## 2023-09-10 NOTE — Progress Notes (Signed)
PROGRESS NOTE    Amy [name]  DGL:[id number] DOB: 05-Mar-1929 DOA: 09/08/2023 PCP: Danella Penton, MD  220A/220A-AA  LOS: 1 day   Brief hospital course:   Assessment & Plan: Amy [name] is a 89 y.o. female with medical history significant for HTN, nocturnal tachycardia on diltiazem, plasmacytoma not on treatment and history of recurrent UTIs frequent falls,  who was brought from [facility] for evaluation of weakness cough and increasing confusion developing over the past few days.  Daughter Amy [name] daughter said she had had complained of right flank pain.  States that she was placed in ALF 2 weeks prior due to frequent falls and feels like she has not been adjusting well.  Patient who has foot drop and uses a brace and a walker had not been as ambulant as she used to be over the past several weeks and also had a chronic cough and been sitting up in the recliner to sleep.  Since going to the facility they have been putting her in the bed to sleep.  States that she has overall been declining. Was febrile with EMS at 101.3.  Arrived on 4 L satting around 91%.     * Sepsis (HCC) 2/2 Pneumonia --fever, leukocytosis.   --No obvious consolidation on CT chest, however, given reported fever, cough and hypoxia and procal 2.09, will treat as PNA with abx.  Started on vanc/cefe/azithro on presentation. --of note, blood cx were obtained after abx Plan: --cont abx as ceftriaxone and azithromycin --start prednisone 40 mg daily for persistent and bothersome cough  Acute respiratory failure with hypoxia ED noted documented O2 86% on RA placed on 4L.  No O2 requirement at baseline. --presume due to PNA and small bilateral partially loculated pleural effusions.  COVID and RVP neg.   --Continue supplemental O2 to keep sats >=92%, wean as tolerated  Acute metabolic encephalopathy Secondary to sepsis Delirium precautions  Right flank pain History of recurrent UTIs CT abdomen and pelvis with  no acute findings  Acute renal failure superimposed on stage 3a chronic kidney disease (HCC) --Cr 1.27 on presentation.  Improved to 0.75 today after IVF. --hold home torsemide  Hyponatremia Possibly related to dehydration and hypovolemia. --improved with IVF  Frailty Frequent falls Foot drop/uses brace and ambulate walker Daughter states that patient was falling a lot and they made a decision to place her in an ALF but states she has not been adjusting well and has poor appetite as she does not like the food --Pt however said today she wished to return to ALF as soon as she can. --PT/OT  Chronic tachycardia Patient has nocturnal tachycardia and recently had her metoprolol switched to diltiazem by PCP --cont home Cardizem CD 120 mg BID  Stenosis of left subclavian artery (HCC) High-grade stenosis proximal left subclavian artery Incidental finding Given age and frailty likely not amenable to intervention   Hx of deep vein thrombophlebitis of lower extremity CTA negative for PE  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HCC) In remission.  Followed at [facility]   CAD in native artery Continue aspirin   DVT prophylaxis: Lovenox SQ Code Status: DNR  Family Communication: daughter updated at bedside today Level of care: Med-Surg Dispo:   The patient is from: ALF Anticipated d/c is to: ALF vs SNF rehab Anticipated d/c date is: Friday or after   Subjective and Interval History:  Pt had rapid response last night for chest pain.  Workup neg.  Pt looked better this morning, but she said she didn't feel  good.  Complained of right toes hurting.  Also coughing at night.   Objective: Vitals:   09/10/23 0217 09/10/23 0601 09/10/23 0801 09/10/23 1625  BP: 129/73 (!) 153/69 129/62 (!) 148/84  Pulse: 87 80 87 94  Resp: 20  18 18   Temp: (!) 97.4 F (36.3 C)  97.8 F (36.6 C) 98.2 F (36.8 C)  TempSrc: Oral  Oral Oral  SpO2: 94%  96% 95%  Weight:      Height:        Intake/Output Summary (Last 24  hours) at 09/10/2023 1821 Last data filed at 09/10/2023 8295 Gross per 24 hour  Intake 837.5 ml  Output 200 ml  Net 637.5 ml   Filed Weights   09/08/23 1912  Weight: 65.3 kg    Examination:   Constitutional: NAD, AAOx3, sitting in recliner HEENT: conjunctivae and lids normal, EOMI CV: No cyanosis.   RESP: normal respiratory effort, on 3L Extremities: some pitting edema in BLE SKIN: warm, dry Neuro: II - XII grossly intact.   Psych: depressed mood and affect.     Data Reviewed: I have personally reviewed labs and imaging studies  Time spent: 50 minutes  Darlin Priestly, MD [facility] If 7PM-7AM, please contact night-coverage 09/10/2023, 6:21 PM

## 2023-09-10 NOTE — TOC Progression Note (Signed)
Transition of Care [facility]) - Progression Note    Patient Details  Name: Amy [name] MRN: [id number] Date of Birth: 1929/03/17  Transition of [facility]) CM/SW Contact  Chapman Fitch, RN Phone Number: 09/10/2023, 3:55 PM  Clinical Narrative:     Bed search for SNF started incase [facility] is not able to meet her needs at discharge        Expected Discharge Plan and Services                                               Social Determinants of Health (SDOH) Interventions SDOH Screenings   Food Insecurity: No Food Insecurity (09/09/2023)  Housing: Low Risk  (09/09/2023)  Transportation Needs: No Transportation Needs (09/09/2023)  Utilities: Not At Risk (09/09/2023)  Tobacco Use: Low Risk  (09/08/2023)    Readmission Risk Interventions     No data to display

## 2023-09-10 NOTE — TOC Progression Note (Signed)
Transition of Care [facility]) - Progression Note    Patient Details  Name: Amy [name] MRN: [id number] Date of Birth: 09/04/1929  Transition of Care [facility]) CM/SW Contact  Chapman Fitch, RN Phone Number: 09/10/2023, 2:09 PM  Clinical Narrative:    Per Marya Landry at [facility] they would have to assess prior to patient returning. She states that she will be able to assess patient tomorrow. Therapy notes and H&P faxed to [facility] at [phone]   Message from secretary that daughter called to speak with TOC  Attempted to call both daughters on file. No answer          Expected Discharge Plan and Services                                               Social Determinants of Health (SDOH) Interventions SDOH Screenings   Food Insecurity: No Food Insecurity (09/09/2023)  Housing: Low Risk  (09/09/2023)  Transportation Needs: No Transportation Needs (09/09/2023)  Utilities: Not At Risk (09/09/2023)  Tobacco Use: Low Risk  (09/08/2023)    Readmission Risk Interventions     No data to display

## 2023-09-10 NOTE — Progress Notes (Signed)
Pharmacy Antibiotic Note  Amy [name] is a 89 y.o. female admitted on 09/08/2023 with pneumonia/CAP.  Pharmacy has been consulted for Cefepime & Vancomycin dosing for 5 days.  Plan: Change vancomycin to 750 mg IV every 24 hours Estimated AUC 420.8, Cmin 11.2 IBW, Scr rounded to 0.8, Vd 0.72 Change cefepime to 2 grams IV every 12 hours Follow renal function and cultures for further adjustments  Pharmacy will continue to follow and will adjust abx dosing whenever warranted.  Temp (24hrs), Avg:97.7 F (36.5 C), Min:97.4 F (36.3 C), Max:97.8 F (36.6 C)   Recent Labs  Lab 09/08/23 1915 09/08/23 2034 09/09/23 0219 09/10/23 0611  WBC 14.9*  --  13.0* 9.1  CREATININE 1.27*  --  1.10* 0.75  LATICACIDVEN  --  1.8 1.2  --     Estimated Creatinine Clearance: 40.4 mL/min (by C-G formula based on SCr of 0.75 mg/dL).    Allergies  Allergen Reactions   Doxycycline Hyclate Nausea Only   Levofloxacin Nausea And Vomiting   Sulfa Antibiotics Rash    Other Reaction: Throat feels funny    Antimicrobials this admission: 9/16 Azithromycin >> x 1 dose 9/16 Cefepime >> x 5 days 9/17 Vancomycin >> x 5 days  Microbiology results: 9/16 BCx: ngtd 9/17 ucx: no growth 9/17 MRSA screen: ordered  Thank you for allowing pharmacy to be a part of this patient's care.  Barrie Folk, PharmD 09/10/2023 11:34 AM

## 2023-09-10 NOTE — Progress Notes (Signed)
Physical Therapy Treatment Patient Details Name: Amy [name] MRN: [id number] DOB: Mar 25, 1929 Today's Date: 09/10/2023   History of Present Illness Amy [name] is a 89 year old female with history of HTN, HLD, DVT, plasmacytoma not on treatment presenting to the emergency department for evaluation of weakness and cough.  History obtained primarily from patient's daughter.  She reports that over the past few days she has noticed increased confusion.  She has had a cough, no known sick contacts but recently moved into [facility].  Has a history of recurrent UTIs and has been complaining of flank pain for a few days.  Did have a fever of 101.3 with EMS.    PT Comments  Pt progressing towards PT goals. Pt is received in bed, she is agreeable to PT with encouragement. Pt performs all mobility mod A secondary to generalized weakness and fear of falling. Donned Pt's R foot soft AFO which appears more like a high ankle sprain brace-Pt uses for foot drop. Pt able to perform 3x STS using RW to focus on BLE strengthening but requires mod A and frequent cuing for weight shifting forward to facilitate task. Unable to progress mobility at this time secondary to safety concerns and gross weakness. Pt reports "I don't want to live like this; I just want to die" at end of session. Communicated with care team via secure chat regard palliative consult. Pt would benefit from cont skilled PT to address above deficits and promote optimal return to PLOF.     If plan is discharge home, recommend the following: A lot of help with walking and/or transfers;A lot of help with bathing/dressing/bathroom;Help with stairs or ramp for entrance;Assist for transportation;Supervision due to cognitive status;Assistance with cooking/housework   Can travel by private vehicle     No  Equipment Recommendations  Other (comment) (TBD at next facility)    Recommendations for Other Services       Precautions / Restrictions  Precautions Precautions: Fall Precaution Comments: R foot drop Required Braces or Orthoses: Other Brace Other Brace: soft AFO for R LE Restrictions Weight Bearing Restrictions: No     Mobility  Bed Mobility Overal bed mobility: Needs Assistance Bed Mobility: Supine to Sit, Sit to Supine     Supine to sit: Mod assist Sit to supine: Mod assist   General bed mobility comments: Able to initiate bed mobility but requires mod A for completion of task specially BLE management    Transfers Overall transfer level: Needs assistance Equipment used: Rolling walker (2 wheels) Transfers: Sit to/from Stand Sit to Stand: Mod assist           General transfer comment: heavy reliance on AD; 3x requires mod A for initiation of task; frequent cuing for upright posture although heavy posterior lean    Ambulation/Gait               General Gait Details: Unable to perform at this time secondary to fatigue   Stairs             Wheelchair Mobility     Tilt Bed    Modified Rankin (Stroke Patients Only)       Balance Overall balance assessment: History of Falls, Needs assistance Sitting-balance support: Bilateral upper extremity supported, Feet supported Sitting balance-Leahy Scale: Fair Sitting balance - Comments: Able to maintain seated balance at EOB with BUE support but requires CGA-min A for safety secondary to poor core control Postural control: Posterior lean Standing balance support: Bilateral upper extremity supported, During  functional activity, Reliant on assistive device for balance Standing balance-Leahy Scale: Fair Standing balance comment: heavy reliance on AD; slight unsteadiness requiring occasional min A for maintaining static balance                            Cognition Arousal: Alert Behavior During Therapy: WFL for tasks assessed/performed Overall Cognitive Status: Within Functional Limits for tasks assessed                                  General Comments: AO x4; Able to recall who PT was and cooperative during therapy        Exercises Other Exercises Other Exercises: x4 lat steps using RW towards HOB, min A    General Comments        Pertinent Vitals/Pain Pain Assessment Pain Assessment: Faces Faces Pain Scale: Hurts little more Pain Location: low back Pain Descriptors / Indicators: Discomfort, Grimacing, Guarding, Aching Pain Intervention(s): Limited activity within patient's tolerance, Premedicated before session, Repositioned    Home Living                          Prior Function            PT Goals (current goals can now be found in the care plan section) Acute Rehab PT Goals Patient Stated Goal: reduce pain PT Goal Formulation: With patient Time For Goal Achievement: 09/23/23 Potential to Achieve Goals: Fair Progress towards PT goals: Progressing toward goals    Frequency    Min 1X/week      PT Plan      Co-evaluation              AM-PAC PT "6 Clicks" Mobility   Outcome Measure  Help needed turning from your back to your side while in a flat bed without using bedrails?: A Lot Help needed moving from lying on your back to sitting on the side of a flat bed without using bedrails?: A Lot Help needed moving to and from a bed to a chair (including a wheelchair)?: A Lot Help needed standing up from a chair using your arms (e.g., wheelchair or bedside chair)?: A Lot Help needed to walk in hospital room?: A Lot Help needed climbing 3-5 steps with a railing? : Total 6 Click Score: 11    End of Session Equipment Utilized During Treatment: Oxygen;Gait belt;Other (comment) (RLE soft AFO on) Activity Tolerance: Patient limited by fatigue;Patient limited by pain Patient left: in bed;with call bell/phone within reach;with bed alarm set Nurse Communication: Mobility status PT Visit Diagnosis: Unsteadiness on feet (R26.81);Repeated falls (R29.6);Muscle weakness  (generalized) (M62.81);History of falling (Z91.81);Pain Pain - Right/Left:  (bilateral) Pain - part of body:  (back)     Time: 9528-4132 PT Time Calculation (min) (ACUTE ONLY): 19 min  Charges:                            Elmon Else, SPT    Rorie Delmore 09/10/2023, 3:01 PM

## 2023-09-10 NOTE — Progress Notes (Addendum)
Rapid Response CROSS COVER NOTE  NAME: Amy [name] MRN: [id number] DOB : 04/06/1929    Concern as stated by nurse / staff   Page for rapid response for chest pain     Pertinent findings on chart review: Patient admitted on 9/17 with sepsis secondary to pneumonia.  Has a history of CAD and incidental finding of high-grade stenosis of left subclavian artery on workup   Patient assessment On my arrival, patient sitting up in bed appears comfortable.  Rapid response team at bedside Patient awake and alert.  Points to pain in the left parasternal area which she states is already starting to improve.  Denies nausea, palpitations, lightheadedness    09/09/2023   10:00 PM 09/09/2023    9:57 PM 09/09/2023    9:54 PM  Vitals with BMI  Systolic 120 113 89  Diastolic 67 74 77   Physical Exam Vitals and nursing note reviewed.  Constitutional:      General: She is not in acute distress. HENT:     Head: Normocephalic and atraumatic.  Cardiovascular:     Rate and Rhythm: Normal rate and regular rhythm.     Heart sounds: Normal heart sounds.  Pulmonary:     Effort: Pulmonary effort is normal.     Breath sounds: Normal breath sounds.  Abdominal:     Palpations: Abdomen is soft.     Tenderness: There is no abdominal tenderness.  Neurological:     Mental Status: Mental status is at baseline.    Cardiac Panel (last 3 results) Recent Labs    09/09/23 2211  TROPONINIHS 9   D-dimer 3.67  EKG non acute      Assessment and  Interventions   Assessment:  Chest pain Elevated D-dimer-  CTA negative for PE Bilateral pleural effusion      CTA chest IMPRESSION: No evidence of pulmonary emboli.  Increasing bilateral pleural effusions with associated lower lobe atelectasis.  -IV Lasix - Consider thoracentesis if worsening     CRITICAL CARE Performed by: Andris Baumann   Total critical care time: 90 minutes  Critical care time was exclusive of separately  billable procedures and treating other patients.  Critical care was necessary to treat or prevent imminent or life-threatening deterioration.  Critical care was time spent personally by me on the following activities: development of treatment plan with patient and/or surrogate as well as nursing, discussions with consultants, evaluation of patient's response to treatment, examination of patient, obtaining history from patient or surrogate, ordering and performing treatments and interventions, ordering and review of laboratory studies, ordering and review of radiographic studies, pulse oximetry and re-evaluation of patient's condition.

## 2023-09-10 NOTE — Plan of Care (Signed)
Problem: Nutrition: Goal: Adequate nutrition will be maintained Outcome: Progressing   Problem: Pain Managment: Goal: General experience of comfort will improve Outcome: Progressing   Problem: Skin Integrity: Goal: Risk for impaired skin integrity will decrease Outcome: Progressing

## 2023-09-10 NOTE — NC FL2 (Signed)
[place] MEDICAID FL2 LEVEL OF CARE FORM     IDENTIFICATION  Patient Name: Amy [name] Birthdate: 10/07/1929 Sex: female Admission Date (Current Location): 09/08/2023  [facility] and [place] Number:  Chiropodist and Address:  [facility], [address] [place], [place] [address]      Provider Number: [id number]  Attending Physician Name and Address:  Darlin Priestly, MD  Relative Name and Phone Number:  [name] (Daughter)  [phone]    Current Level of Care: Hospital Recommended Level of Care: Skilled Nursing Facility Prior Approval Number:    Date Approved/Denied:   PASRR Number: [id number]  Discharge Plan: SNF    Current Diagnoses: Patient Active Problem List   Diagnosis Date Noted   Sepsis (HCC) 09/09/2023   Stenosis of left subclavian artery (HCC) 09/09/2023   Acute respiratory failure with hypoxia (HCC) 09/09/2023   Pneumonia 09/09/2023   Right flank pain 09/09/2023   History of recurrent UTI (urinary tract infection) 09/09/2023   Hyponatremia 09/09/2023   Hypochloremia 09/09/2023   Chronic cough 09/09/2023   Chronic tachycardia 09/09/2023   Frailty 09/09/2023   Acute metabolic encephalopathy 09/09/2023   Goals of care, counseling/discussion 08/04/2020   Neck pain 08/04/2020   Abnormal MRI, neck 08/04/2020   History of plasmacytoma 08/04/2020   Orthostatic hypotension 09/13/2016   Dehydration 09/13/2016   Trismus 09/12/2016   Syncope 09/11/2016   Poor appetite 09/11/2016   Atherosclerosis of native coronary artery of native heart without angina pectoris 05/13/2016   Hiatal hernia without gangrene and obstruction 12/01/2015   Herpes zona 11/25/2015   Hx of deep vein thrombophlebitis of lower extremity 11/25/2015   Senile ecchymosis 11/14/2015   Mixed hyperlipidemia 11/14/2015   Acute renal failure superimposed on stage 3a chronic kidney disease (HCC) 11/09/2015   Arthritis, degenerative 11/09/2015   Cervical  spinal cord compression (HCC) 11/09/2015   Osteoporosis, post-menopausal 11/09/2015   Female genuine stress incontinence 11/09/2015   Polyneuropathy 09/30/2014   CAD in native artery 06/08/2014   Benign essential HTN 06/08/2014   MI (mitral incompetence) 06/08/2014   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HCC) 05/28/2013    Orientation RESPIRATION BLADDER Height & Weight     Self, Place  O2 (4L nasal cannula) Continent Weight: 144 lb (65.3 kg) Height:  5\' 5"  (165.1 cm)  BEHAVIORAL SYMPTOMS/MOOD NEUROLOGICAL BOWEL NUTRITION STATUS      Continent Diet (see discharge summary)  AMBULATORY STATUS COMMUNICATION OF NEEDS Skin   Extensive Assist Verbally Normal                       Personal Care Assistance Level of Assistance  Bathing, Feeding, Dressing, Total care Bathing Assistance: Maximum assistance Feeding assistance: Limited assistance Dressing Assistance: Maximum assistance Total Care Assistance: Maximum assistance   Functional Limitations Info  Sight, Hearing, Speech Sight Info: Impaired Hearing Info: Impaired Speech Info: Adequate    SPECIAL CARE FACTORS FREQUENCY  PT (By licensed PT), OT (By licensed OT)     PT Frequency: min 4x weekly OT Frequency: min 4x weekly            Contractures Contractures Info: Not present    Additional Factors Info  Code Status, Allergies Code Status Info: DNR Arrest Interventions Desired Allergies Info: doxycycline hyclate, levofloxacin, sulfa antibiotics           Current Medications (09/10/2023):  This is the current hospital active medication list Current Facility-Administered Medications  Medication Dose Route Frequency Provider Last Rate Last Admin   acetaminophen (  TYLENOL) tablet 1,000 mg  1,000 mg Oral TID PRN Darlin Priestly, MD   1,000 mg at 09/10/23 0237   albuterol (PROVENTIL) (2.5 MG/3ML) 0.083% nebulizer solution 2.5 mg  2.5 mg Nebulization Q2H PRN Andris Baumann, MD       aspirin EC tablet 81 mg  81 mg Oral Daily Lindajo Royal V, MD    81 mg at 09/10/23 2595   azithromycin (ZITHROMAX) 500 mg in sodium chloride 0.9 % 250 mL IVPB  500 mg Intravenous Q24H Trinna Post, MD 250 mL/hr at 09/09/23 2236 500 mg at 09/09/23 2236   cefTRIAXone (ROCEPHIN) 2 g in sodium chloride 0.9 % 100 mL IVPB  2 g Intravenous Q24H Darlin Priestly, MD       guaiFENesin [facility]) 12 hr tablet 600 mg  600 mg Oral BID Lindajo Royal V, MD   600 mg at 09/10/23 6387   hydrOXYzine (ATARAX) tablet 25 mg  25 mg Oral TID PRN Darlin Priestly, MD   25 mg at 09/10/23 1109   melatonin tablet 2.5 mg  2.5 mg Oral QHS Lindajo Royal V, MD   2.5 mg at 09/10/23 0013   nitroGLYCERIN (NITROSTAT) SL tablet 0.4 mg  0.4 mg Sublingual Q5 min PRN Andris Baumann, MD   0.4 mg at 09/09/23 2141   ondansetron (ZOFRAN) tablet 4 mg  4 mg Oral Q6H PRN Andris Baumann, MD       Or   ondansetron [facility]) injection 4 mg  4 mg Intravenous Q6H PRN Andris Baumann, MD   4 mg at 09/10/23 0649   predniSONE (DELTASONE) tablet 40 mg  40 mg Oral Q breakfast Darlin Priestly, MD   40 mg at 09/10/23 1248   vancomycin (VANCOREADY) IVPB 750 mg/150 mL  750 mg Intravenous Q24H Barrie Folk, RPH 150 mL/hr at 09/10/23 1422 750 mg at 09/10/23 1422     Discharge Medications: Please see discharge summary for a list of discharge medications.  Relevant Imaging Results:  Relevant Lab Results:   Additional Information SSN: [id number]  Darolyn Rua, LCSW

## 2023-09-10 NOTE — Progress Notes (Addendum)
Speech Language Pathology Treatment: Dysphagia  Patient Details Name: Amy [name] MRN: [id number] DOB: 25-May-1929 Today's Date: 09/10/2023 Time: 4098-1191 SLP Time Calculation (min) (ACUTE ONLY): 45 min  Assessment / Plan / Recommendation Clinical Impression  Pt seen for toleration of diet and trials to upgrade diet consistency. Pt awake, verbal. Cognitive decline apparent in her responses. She had a stuffed bear in her lap. She perseverated on her head feeling "heavy" and that "they are killing my head w/ too much Tylenol". Pt has suspected mild Cognitive decline, per Daugher's agreement yesterday.  On [place] O2 4L; afebrile. WBC WNL.   Pt appears to present w/ grossly functional oropharyngeal phase swallowing w/ only slightly, min increased oral phase time w/ increased textured foods -- min lengthy mastication. No immediate, overt clinical s/s of aspiration during the po trials. Pt appears to present w/ suspected slower Esophageal phase motility and impact from such. Pt has a Dx'd Large Hiatal Hernia per Imaging.  Pt appears at reduced risk for aspiration when following general aspiration precautions and supported w/ a more soft, chopped foods(Meats Minced) in her diet for ease of chewing/intake overall.  Pt appears to have challenging factors that could impact her oropharyngeal swallowing to include discomfort(NSG aware), fatigue/weakness, Mild Cognitive decline, and advanced age as well as current need for support w/ feeding at meals. ALSO: pt has a Dx'd Large Hiatal Hernia -- ANY Esophageal phase Dysmotility can increase risk for Retrograde flow and aspiration of REFLUX material. These factors can increase risk for aspiration, dysphagia as well as decreased oral intake overall.    During po trials, pt consumed trials of moistened soft solids, purees, and thin liquids VIA CUP w/ no immediate, overt coughing, decline in vocal quality, or change in respiratory presentation during/post trials. Rest  breaks were given intermittently to support baseline Pulmonary status and Esophageal clearing. Oral phase appeared min lengthy for bolus management, mastication of softened solids but overall functional w/ oral clearing achieved w/ all trials.  Pt held Cup to feed self w/ setup support.    Recommend a more Mech Soft diet w/ Meats Minced w/ well-moistened foods(Dtr stated she was "cutting up her foods real small" at home); Thin liquids via Cup -- pt should help to [employer] when drinking. Recommend general aspiration precautions, tray setup and sitting support. Reduce distractions at meals. Rest Breaks for conservation of energy. Pills CRUSHED in Puree for safer, easier swallowing.   Education given on Pills in Puree; food consistencies and easy to eat options; general aspiration precautions to pt; posted in room/chart. ST services will follow for toleration of diet x1; education as indicated. MD/NSG updated, agreed. Recommend Dietician f/u for support. Recommend Palliative Care consult for GOC discussion per Daughter's description of overall decline in functioning at advanced age.     HPI HPI: Pt is a 89 y.o. female with medical history significant for HTN, nocturnal tachycardia on diltiazem, plasmacytoma not on treatment and history of recurrent UTIs frequent falls,  who was brought from [facility] for evaluation of weakness cough and increasing confusion developing over the past few days.  Daughter Amy [name] daughter said she had had complained of right flank pain.  States that she was placed in ALF 2 weeks prior due to frequent falls and feels like she has not been adjusting well.  Patient who has foot drop and uses a brace and a walker had not been as ambulant as she used to be over the past several weeks and also had a  chronic cough and been sitting up in the recliner to sleep.  Since going to the facility they have been putting her in the bed to sleep.  Daughter states that she has overall been  declining in recent weeks requiring move to [place](prior to that, pt had been staying w/ the 3 dtrs on different days).   CT Of Chest: Dense areas of linear consolidation at the lung bases  with associated volume loss consistent with atelectasis. There are  trace bilateral partially loculated pleural effusions. No  pneumothorax. Central airways are patent.  Large Hiatal Hernia.      SLP Plan  Continue with current plan of care (po check then s/o)      Recommendations for follow up therapy are one component of a multi-disciplinary discharge planning process, led by the attending physician.  Recommendations may be updated based on patient status, additional functional criteria and insurance authorization.    Recommendations  Diet recommendations: Dysphagia 3 (mechanical soft);Thin liquid (meats minced w/ gravies) Liquids provided via: Cup;No straw Medication Administration: Crushed with puree Supervision: Patient able to self feed;Staff to assist with self feeding;Intermittent supervision to cue for compensatory strategies Compensations: Minimize environmental distractions;Slow rate;Small sips/bites;Lingual sweep for clearance of pocketing;Follow solids with liquid Postural Changes and/or Swallow Maneuvers: Out of bed for meals;Upright 30-60 min after meal;Seated upright 90 degrees                 (Palliative Care f/u for GOC; Dietician) Oral care BID;Oral care before and after PO;Staff/trained caregiver to provide oral care ([place] care)   Frequent or constant Supervision/Assistance (Cognitive decline) Dysphagia, unspecified (R13.10) (Esophageal phase dysmotility suspected; Larg Hiatal Hernia per chart; Cognitive decline)     Continue with current plan of care (po check then s/o)       Jerilynn Som, MS, CCC-SLP Speech Language Pathologist Rehab Services; [facility] [phone] (ascom) Baleigh Rennaker  09/10/2023, 1:23 PM

## 2023-09-11 DIAGNOSIS — A419 Sepsis, unspecified organism: Secondary | ICD-10-CM | POA: Diagnosis not present

## 2023-09-11 DIAGNOSIS — N179 Acute kidney failure, unspecified: Secondary | ICD-10-CM | POA: Diagnosis not present

## 2023-09-11 DIAGNOSIS — R6520 Severe sepsis without septic shock: Secondary | ICD-10-CM | POA: Diagnosis not present

## 2023-09-11 LAB — BASIC METABOLIC PANEL
Anion gap: 14 (ref 5–15)
BUN: 24 mg/dL — ABNORMAL HIGH (ref 8–23)
CO2: 27 mmol/L (ref 22–32)
Calcium: 8.9 mg/dL (ref 8.9–10.3)
Chloride: 97 mmol/L — ABNORMAL LOW (ref 98–111)
Creatinine, Ser: 0.6 mg/dL (ref 0.44–1.00)
GFR, Estimated: >60 mL/min (ref >60)
Glucose, Bld: 124 mg/dL — ABNORMAL HIGH (ref 70–99)
Potassium: 3.6 mmol/L (ref 3.5–5.1)
Sodium: 138 mmol/L (ref 135–145)

## 2023-09-11 LAB — CBC
HCT: 35.4 % — ABNORMAL LOW (ref 36.0–46.0)
Hemoglobin: 11.4 g/dL — ABNORMAL LOW (ref 12.0–15.0)
MCH: 28 pg (ref 26.0–34.0)
MCHC: 32.2 g/dL (ref 30.0–36.0)
MCV: 87 fL (ref 80.0–100.0)
Platelets: 277 10*3/uL (ref 150–400)
RBC: 4.07 MIL/uL (ref 3.87–5.11)
RDW: 14.2 % (ref 11.5–15.5)
WBC: 8.3 10*3/uL (ref 4.0–10.5)
nRBC: 0 % (ref 0.0–0.2)

## 2023-09-11 LAB — MAGNESIUM: Magnesium: 2.1 mg/dL (ref 1.7–2.4)

## 2023-09-11 MED ORDER — KETOROLAC TROMETHAMINE 15 MG/ML IJ SOLN: 15.0000 mg | Freq: Four times a day (QID) | INTRAMUSCULAR | Status: DC | PRN

## 2023-09-11 MED ORDER — AZITHROMYCIN 250 MG PO TABS
500.0000 mg | ORAL_TABLET | Freq: Every day | ORAL | Status: AC
  Administered 2023-09-11 – 2023-09-12 (×2): 500 mg via ORAL
  Filled 2023-09-11 (×2): qty 2

## 2023-09-11 MED ORDER — POTASSIUM CHLORIDE 20 MEQ PO PACK
40.0000 meq | PACK | Freq: Once | ORAL | Status: AC
  Administered 2023-09-11: 40 meq via ORAL
  Filled 2023-09-11: qty 2

## 2023-09-11 MED ORDER — IBUPROFEN 400 MG PO TABS
400.0000 mg | ORAL_TABLET | Freq: Four times a day (QID) | ORAL | Status: DC | PRN
  Administered 2023-09-11: 400 mg via ORAL
  Filled 2023-09-11: qty 1

## 2023-09-11 NOTE — Progress Notes (Signed)
Occupational Therapy Treatment Patient Details Name: Amy [name] MRN: [id number] DOB: 1929-11-02 Today's Date: 09/11/2023   History of present illness Amy [name] is a 89 year old female with history of HTN, HLD, DVT, plasmacytoma not on treatment presenting to the emergency department for evaluation of weakness and cough.  History obtained primarily from patient's daughter.  She reports that over the past few days she has noticed increased confusion.  She has had a cough, no known sick contacts but recently moved into [facility].  Has a history of recurrent UTIs and has been complaining of flank pain for a few days.  Did have a fever of 101.3 with EMS.   OT comments  Pt seen for OT/PT co-tx this date to maximize therepeutic outcomes and for patient/therapist safety. Pt more alert this session, demonstrating STM deficits in regards to d/c planning and daughter's location. Overall, she was very appreciative to see therapy team and requests they return more so she can "walk again". Pleasantly confused at times but easily redirected. ADL performance facilitated as described below. MinA for bed mobility, sits EOB to perform grooming/UB/LB tasks, stands to take lateral steps towards [facility] with +2 HHA. Poor standing tolerance, but overall good dynamic sitting for functional tasks. Pt continues to benefit from skilled OT services for functional gains. Pt seen for OT treatment on this date. Pt making good progress toward goals, will continue to follow POC. Discharge recommendation remains appropriate.        If plan is discharge home, recommend the following:  A lot of help with walking and/or transfers;A lot of help with bathing/dressing/bathroom;Assistance with cooking/housework;Assistance with feeding;Direct supervision/assist for medications management;Direct supervision/assist for financial management;Help with stairs or ramp for entrance;Assist for transportation;Supervision due to cognitive  status   Equipment Recommendations  None recommended by OT    Recommendations for Other Services Other (comment)    Precautions / Restrictions Precautions Precautions: Fall Precaution Comments: R foot drop Required Braces or Orthoses: Other Brace Other Brace: soft AFO (brace) for R LE Restrictions Weight Bearing Restrictions: No       Mobility Bed Mobility Overal bed mobility: Needs Assistance Bed Mobility: Supine to Sit, Sit to Supine     Supine to sit: Min assist Sit to supine: Min assist, +2 for physical assistance   General bed mobility comments: Able to initiate bed mobillity but requires min A x2 for BLE management and trunk control; cuing for upright standing posture    Transfers Overall transfer level: Needs assistance Equipment used: 2 person hand held assist Transfers: Sit to/from Stand Sit to Stand: Min assist, +2 physical assistance           General transfer comment: heavy reliance on 2 HHA; about to perform 3x STS with min A x2; heavy posterior lean     Balance Overall balance assessment: History of Falls, Needs assistance Sitting-balance support: Feet supported, Single extremity supported, No upper extremity supported Sitting balance-Leahy Scale: Fair Sitting balance - Comments: Able to maintain static and dynamic seated balance at EOB during functional activities with mild posterior lean that was able to correct with cuing Postural control: Posterior lean Standing balance support: Bilateral upper extremity supported, During functional activity, Reliant on assistive device for balance Standing balance-Leahy Scale: Fair Standing balance comment: heavy reliance on 2 HHA; unable to tolerate standing period for more than 15 secs  ADL either performed or assessed with clinical judgement   ADL Overall ADL's : Needs assistance/impaired     Grooming: Wash/dry hands;Wash/dry face;Applying deodorant;Brushing hair;Set  up;Sitting   Upper Body Bathing: Set up;Sitting           Lower Body Dressing: Minimal assistance;Maximal assistance;Sitting/lateral leans Lower Body Dressing Details (indicate cue type and reason): socks/shoes             Functional mobility during ADLs: +2 for physical assistance General ADL Comments: Pt pleasant and performing seated ADL tasks with occasional redirection (wonders where daugther is, discharge plans, etc).      Cognition Arousal: Alert Behavior During Therapy: WFL for tasks assessed/performed Overall Cognitive Status: Within Functional Limits for tasks assessed                                 General Comments: AO x2 with occasional asks about discharge plans and where daughter is                   Pertinent Vitals/ Pain       Pain Assessment Pain Assessment: No/denies pain   Frequency  Min 1X/week        Progress Toward Goals  OT Goals(current goals can now be found in the care plan section)  Progress towards OT goals: Progressing toward goals  Acute Rehab OT Goals OT Goal Formulation: Patient unable to participate in goal setting Time For Goal Achievement: 09/23/23 Potential to Achieve Goals: Fair  Plan         AM-PAC OT "6 Clicks" Daily Activity     Outcome Measure   Help from another person eating meals?: A Little Help from another person taking care of personal grooming?: A Lot Help from another person toileting, which includes using toliet, bedpan, or urinal?: A Lot Help from another person bathing (including washing, rinsing, drying)?: A Lot Help from another person to put on and taking off regular upper body clothing?: A Lot Help from another person to put on and taking off regular lower body clothing?: A Lot 6 Click Score: 13    End of Session    OT Visit Diagnosis: Unsteadiness on feet (R26.81);History of falling (Z91.81);Muscle weakness (generalized) (M62.81)   Activity Tolerance Patient tolerated  treatment well   Patient Left in bed;with call bell/phone within reach;with bed alarm set   Nurse Communication Mobility status        Time: 1425-1449 OT Time Calculation (min): 24 min  Charges: OT General Charges $OT Visit: 1 Visit OT Treatments $Self Care/Home Management : 8-22 mins  Kathy Wahid L. Hong Moring, OTR/L  09/11/23, 4:37 PM

## 2023-09-11 NOTE — Progress Notes (Signed)
Physical Therapy Treatment Patient Details Name: Amy [name] MRN: [id number] DOB: October 25, 1929 Today's Date: 09/11/2023   History of Present Illness Amy [name] is a 89 year old female with history of HTN, HLD, DVT, plasmacytoma not on treatment presenting to the emergency department for evaluation of weakness and cough.  History obtained primarily from patient's daughter.  She reports that over the past few days she has noticed increased confusion.  She has had a cough, no known sick contacts but recently moved into [facility].  Has a history of recurrent UTIs and has been complaining of flank pain for a few days.  Did have a fever of 101.3 with EMS.    PT Comments  Pt seen by OT and PT for co-treatment secondary to Pt's decreased activity tolerance. Pt is received in bed, she is agreeable to PT session. Pt performs bed mobility min A and transfers min A x2. Pt able to demonstrate increase BLE strength as seen by ability to press feet into shoes to complete don and fix socks seated EOB. Additionally, Pt able to perform multi STS min A x2 and lat steps towards [facility] but requires frequent cuing to maintain upright standing posture to reduce posterior lean and weight shifting to assist in step patterns. Overall, Pt demonstrates improved static and dynamic seated balance throughout session and activity tolerance. Pt would benefit from skilled PT to address above deficits and promote optimal return to PLOF.    If plan is discharge home, recommend the following: A lot of help with walking and/or transfers;Help with stairs or ramp for entrance;Assist for transportation;Supervision due to cognitive status;Assistance with cooking/housework;A little help with bathing/dressing/bathroom   Can travel by private vehicle     No  Equipment Recommendations  Other (comment) (TBD at next facility)    Recommendations for Other Services       Precautions / Restrictions Precautions Precautions:  Fall Precaution Comments: R foot drop Required Braces or Orthoses: Other Brace Other Brace: soft AFO (brace) for R LE Restrictions Weight Bearing Restrictions: No     Mobility  Bed Mobility Overal bed mobility: Needs Assistance Bed Mobility: Supine to Sit, Sit to Supine     Supine to sit: Min assist Sit to supine: Min assist, +2 for physical assistance   General bed mobility comments: Able to initiate bed mobillity but requires min A x2 for BLE management and trunk control; cuing for upright standing posture    Transfers Overall transfer level: Needs assistance Equipment used: 2 person hand held assist Transfers: Sit to/from Stand Sit to Stand: Min assist, +2 physical assistance           General transfer comment: heavy reliance on 2 HHA; about to perform 3x STS with min A x2; heavy posterior lean    Ambulation/Gait               General Gait Details: Unable to perform at this time secondary to fatigue   Stairs             Wheelchair Mobility     Tilt Bed    Modified Rankin (Stroke Patients Only)       Balance Overall balance assessment: History of Falls, Needs assistance Sitting-balance support: Feet supported, Single extremity supported, No upper extremity supported Sitting balance-Leahy Scale: Fair Sitting balance - Comments: Able to maintain static and dynamic seated balance at EOB during functional activities with mild posterior lean that was able to correct with cuing Postural control: Posterior lean Standing balance support: Bilateral  upper extremity supported, During functional activity, Reliant on assistive device for balance Standing balance-Leahy Scale: Fair Standing balance comment: heavy reliance on 2 HHA; unable to tolerate standing period for more than 15 secs                            Cognition Arousal: Alert Behavior During Therapy: WFL for tasks assessed/performed Overall Cognitive Status: Within Functional  Limits for tasks assessed                                 General Comments: AO x2 with occasional asks about discharge plans and where daughter is        Exercises Other Exercises Other Exercises: x4 lat steps using 2 HHA towards HOB, min A    General Comments        Pertinent Vitals/Pain Pain Assessment Pain Assessment: No/denies pain    Home Living                          Prior Function            PT Goals (current goals can now be found in the care plan section) Acute Rehab PT Goals Patient Stated Goal: reduce pain PT Goal Formulation: With patient Time For Goal Achievement: 09/23/23 Potential to Achieve Goals: Good Progress towards PT goals: Progressing toward goals    Frequency    Min 1X/week      PT Plan      Co-evaluation              AM-PAC PT "6 Clicks" Mobility   Outcome Measure  Help needed turning from your back to your side while in a flat bed without using bedrails?: A Lot Help needed moving from lying on your back to sitting on the side of a flat bed without using bedrails?: A Lot Help needed moving to and from a bed to a chair (including a wheelchair)?: A Lot Help needed standing up from a chair using your arms (e.g., wheelchair or bedside chair)?: A Lot Help needed to walk in hospital room?: A Lot Help needed climbing 3-5 steps with a railing? : Total 6 Click Score: 11    End of Session Equipment Utilized During Treatment: Other (comment) (R foot ankle brace) Activity Tolerance: Patient tolerated treatment well Patient left: in bed;with call bell/phone within reach;with bed alarm set Nurse Communication: Mobility status PT Visit Diagnosis: Unsteadiness on feet (R26.81);Repeated falls (R29.6);Muscle weakness (generalized) (M62.81);History of falling (Z91.81);Pain     Time: 1425-1449 PT Time Calculation (min) (ACUTE ONLY): 24 min  Charges:                            Elmon Else, SPT     Lyndzie Zentz 09/11/2023, 3:18 PM

## 2023-09-11 NOTE — TOC Progression Note (Signed)
Transition of Care [facility]) - Progression Note    Patient Details  Name: Jakiera Solorzano MRN: [id number] Date of Birth: 06/16/1929  Transition of Care [facility]) CM/SW Contact  Chapman Fitch, RN Phone Number: 09/11/2023, 1:44 PM  Clinical Narrative:      Per Marya Landry at [facility] she did not come assess the patient, but reviewed clinical and patient unable to return at this time and will have to complete STR first  Met with patient and daughter Dois Davenport at bedside.  Daughter debra on speaker phone.  Bed offers presented.  They accepted bed at [facility]. Notified Ricky at [facility].  Anticipated dc tomorrow       Expected Discharge Plan and Services                                               Social Determinants of Health (SDOH) Interventions SDOH Screenings   Food Insecurity: No Food Insecurity (09/09/2023)  Housing: Low Risk  (09/09/2023)  Transportation Needs: No Transportation Needs (09/09/2023)  Utilities: Not At Risk (09/09/2023)  Tobacco Use: Low Risk  (09/08/2023)    Readmission Risk Interventions     No data to display

## 2023-09-11 NOTE — Progress Notes (Signed)
Hello, pt admitted for sepsis and has a history of nocturnal tachycardia;  around 2:39 tele called and stated that the pt went into nonsustained SVT for about 10 sec. pt also c/o of back pain and stated that tylenol does not work for her. Pt req ibuprofen for pain; Pt is stable and lying in bed.

## 2023-09-11 NOTE — Progress Notes (Signed)
PHARMACIST - PHYSICIAN COMMUNICATION DR:   Fran Lowes CONCERNING: Antibiotic IV to Oral Route Change Policy  RECOMMENDATION: This patient is receiving azithromycin by the intravenous route.  Based on criteria approved by the Pharmacy and Therapeutics Committee, the antibiotic(s) is/are being converted to the equivalent oral dose form(s).   DESCRIPTION: These criteria include: Patient being treated for a respiratory tract infection, urinary tract infection, cellulitis or clostridium difficile associated diarrhea if on metronidazole The patient is not neutropenic and does not exhibit a GI malabsorption state The patient is eating (either orally or via tube) and/or has been taking other orally administered medications for a least 24 hours The patient is improving clinically and has a Tmax < 100.5  If you have questions about this conversion, please contact the Pharmacy Department   Barrie Folk, PharmD

## 2023-09-11 NOTE — Plan of Care (Signed)
  Problem: Clinical Measurements: Goal: Diagnostic test results will improve Outcome: Progressing   Problem: Respiratory: Goal: Ability to maintain adequate ventilation will improve Outcome: Progressing   Problem: Skin Integrity: Goal: Risk for impaired skin integrity will decrease Outcome: Progressing   Problem: Activity: Goal: Risk for activity intolerance will decrease Outcome: Not Progressing

## 2023-09-11 NOTE — Progress Notes (Signed)
PROGRESS NOTE    Amy [name]  YQM:[id number] DOB: 1929/08/29 DOA: 09/08/2023 PCP: Danella Penton, MD  220A/220A-AA  LOS: 2 days   Brief hospital course:   Assessment & Plan: Amy [name] is a 89 y.o. female with medical history significant for HTN, nocturnal tachycardia on diltiazem, plasmacytoma not on treatment and history of recurrent UTIs, frequent falls, who was brought from [facility] for evaluation of weakness cough and increasing confusion developing over the past few days.    Daughter stated that Amy [name] was placed in ALF 2 weeks prior due to frequent falls and feels like she has not been adjusting well.  Patient who has foot drop and uses a brace and a walker had not been as ambulant as she used to be over the past several weeks and also had a chronic cough and been sitting up in the recliner to sleep.  Since going to the facility they have been putting her in the bed to sleep.  States that she has overall been declining. Was febrile with EMS at 101.3.  Arrived on 4 L satting around 91%.     * Sepsis (HCC) 2/2 Pneumonia --fever, leukocytosis.   --No obvious consolidation on CT chest, however, given reported fever, cough and hypoxia and procal 2.09, will treat as PNA with abx.  Started on vanc/cefe/azithro on presentation. --of note, blood cx were obtained after abx --started prednisone 40 mg daily for persistent and bothersome cough Plan: --cont abx as ceftriaxone and azithromycin, day 4 of 5 --cont prednisone 40 mg daily, day 2 today  Acute respiratory failure with hypoxia ED noted documented O2 86% on RA placed on 4L.  No O2 requirement at baseline. --presume due to PNA and small bilateral partially loculated pleural effusions.  COVID and RVP neg.   --Continue supplemental O2 to keep sats >=92%, wean as tolerated  Acute metabolic encephalopathy Secondary to sepsis Delirium precautions  Right flank pain History of recurrent UTIs CT abdomen and pelvis with  no acute findings  Acute renal failure superimposed on stage 3a chronic kidney disease (HCC) --Cr 1.27 on presentation.  Improved to 0.75 after IVF. --hold home torsemide  Hyponatremia Possibly related to dehydration and hypovolemia. --improved with IVF  Frailty Frequent falls Foot drop/uses brace and ambulate walker Daughter states that patient was falling a lot and they made a decision to place her in an ALF but states she has not been adjusting well and has poor appetite as she does not like the food --Amy [name] however said she wished to return to ALF as soon as she can. --Amy [name]/OT  Chronic tachycardia Patient has nocturnal tachycardia and recently had her metoprolol switched to diltiazem by PCP --cont home Cardizem CD 120 mg BID  Stenosis of left subclavian artery (HCC) High-grade stenosis proximal left subclavian artery Incidental finding Given age and frailty likely not amenable to intervention   Hx of deep vein thrombophlebitis of lower extremity CTA negative for PE  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HCC) In remission.  Followed at [facility]   CAD in native artery Continue aspirin   DVT prophylaxis: Lovenox SQ Code Status: DNR  Family Communication:  Level of care: Med-Surg Dispo:   The patient is from: ALF Anticipated d/c is to: SNF rehab Anticipated d/c date is: Friday or after, after completing 5 days of IV abx for PNA   Subjective and Interval History:  RN reported Amy [name] being very anxious today.   Objective: Vitals:   09/10/23 2001 09/11/23 0510 09/11/23 5284 09/11/23 1651  BP: (!) 146/76 (!) 143/80 (!) 141/72 134/84  Pulse: 94 91 94 87  Resp: 18 18 18 18   Temp: 98.2 F (36.8 C) 97.8 F (36.6 C) 98.1 F (36.7 C) 97.9 F (36.6 C)  TempSrc: Oral Oral Oral Oral  SpO2: 96% 93% 95% 96%  Weight:      Height:        Intake/Output Summary (Last 24 hours) at 09/11/2023 1927 Last data filed at 09/11/2023 1926 Gross per 24 hour  Intake 480.22 ml  Output --  Net 480.22 ml   Filed  Weights   09/08/23 1912  Weight: 65.3 kg    Examination:   Constitutional: NAD CV: No cyanosis.   RESP: normal respiratory effort, on RA SKIN: warm, dry   Data Reviewed: I have personally reviewed labs and imaging studies  Time spent: 35 minutes  Darlin Priestly, MD [facility] If 7PM-7AM, please contact night-coverage 09/11/2023, 7:27 PM

## 2023-09-12 DIAGNOSIS — R54 Age-related physical debility: Secondary | ICD-10-CM

## 2023-09-12 DIAGNOSIS — I10 Essential (primary) hypertension: Secondary | ICD-10-CM

## 2023-09-12 DIAGNOSIS — G9340 Encephalopathy, unspecified: Secondary | ICD-10-CM | POA: Diagnosis not present

## 2023-09-12 DIAGNOSIS — J9601 Acute respiratory failure with hypoxia: Secondary | ICD-10-CM | POA: Diagnosis not present

## 2023-09-12 DIAGNOSIS — J189 Pneumonia, unspecified organism: Secondary | ICD-10-CM | POA: Diagnosis not present

## 2023-09-12 DIAGNOSIS — A419 Sepsis, unspecified organism: Secondary | ICD-10-CM | POA: Diagnosis not present

## 2023-09-12 DIAGNOSIS — R0902 Hypoxemia: Secondary | ICD-10-CM

## 2023-09-12 DIAGNOSIS — R053 Chronic cough: Secondary | ICD-10-CM

## 2023-09-12 MED ORDER — PREDNISONE 10 MG PO TABS: 40.0000 mg | ORAL_TABLET | Freq: Every day | ORAL | Status: DC

## 2023-09-12 MED ORDER — GUAIFENESIN-DM 100-10 MG/5ML PO SYRP: 5.0000 mL | ORAL_SOLUTION | ORAL | Status: AC | PRN

## 2023-09-12 MED ORDER — TORSEMIDE 10 MG PO TABS: 10.0000 mg | ORAL_TABLET | Freq: Every day | ORAL | Status: DC | PRN

## 2023-09-12 MED ORDER — MELATONIN 5 MG PO TABS: 2.5000 mg | ORAL_TABLET | Freq: Every day | ORAL | Status: AC

## 2023-09-12 MED ORDER — HYDROXYZINE HCL 25 MG PO TABS: 25.0000 mg | ORAL_TABLET | Freq: Three times a day (TID) | ORAL | Status: AC | PRN

## 2023-09-12 NOTE — Plan of Care (Signed)
Problem: Fluid Volume: Goal: Hemodynamic stability will improve Outcome: Progressing   Problem: Clinical Measurements: Goal: Diagnostic test results will improve Outcome: Progressing Goal: Signs and symptoms of infection will decrease Outcome: Progressing   Problem: Respiratory: Goal: Ability to maintain adequate ventilation will improve Outcome: Progressing   Problem: Education: Goal: Knowledge of General Education information will improve Description: Including pain rating scale, medication(s)/side effects and non-pharmacologic comfort measures Outcome: Progressing   Problem: Health Behavior/Discharge Planning: Goal: Ability to manage health-related needs will improve Outcome: Progressing   Problem: Clinical Measurements: Goal: Ability to maintain clinical measurements within normal limits will improve Outcome: Progressing Goal: Will remain free from infection Outcome: Progressing Goal: Diagnostic test results will improve Outcome: Progressing Goal: Respiratory complications will improve Outcome: Progressing Goal: Cardiovascular complication will be avoided Outcome: Progressing   Problem: Nutrition: Goal: Adequate nutrition will be maintained Outcome: Progressing   Problem: Coping: Goal: Level of anxiety will decrease Outcome: Progressing   Problem: Elimination: Goal: Will not experience complications related to bowel motility Outcome: Progressing Goal: Will not experience complications related to urinary retention Outcome: Progressing   Problem: Pain Managment: Goal: General experience of comfort will improve Outcome: Progressing   Problem: Safety: Goal: Ability to remain free from injury will improve Outcome: Progressing   Problem: Skin Integrity: Goal: Risk for impaired skin integrity will decrease Outcome: Progressing

## 2023-09-12 NOTE — Plan of Care (Signed)

## 2023-09-12 NOTE — Care Management Important Message (Signed)
Important Message  Patient Details  Name: Amy [name] MRN: [id number] Date of Birth: Nov 18, 1929   Medicare Important Message Given:  Yes     Johnell Comings 09/12/2023, 10:57 AM

## 2023-09-12 NOTE — Progress Notes (Signed)
Occupational Therapy Treatment Patient Details Name: Lysha Scher MRN: [id number] DOB: Jul 20, 1929 Today's Date: 09/12/2023   History of present illness Noele Alacia Pastorek is a 89 year old female with history of HTN, HLD, DVT, plasmacytoma not on treatment presenting to the emergency department for evaluation of weakness and cough.  History obtained primarily from patient's daughter.  She reports that over the past few days she has noticed increased confusion.  She has had a cough, no known sick contacts but recently moved into [facility].  Has a history of recurrent UTIs and has been complaining of flank pain for a few days.  Did have a fever of 101.3 with EMS.   OT comments  Pt seen for OT treatment on this date. Upon arrival to room pt visiting with daughter Dois Davenport, agreeable to OT Tx session. OT facilitated ADL management as described below. See ADL section for additional details regarding occupational performance. Pt continues to be functionally limited by generalized weakness, impaired balance and decreased activity tolerance. Benefits from increased time for processing, visual + verbal cues. Improved IND noted in daily tasks after skilled education and instruction. Pt pleasant, and demos mild confusion throughout session but is motivated to return to PLOF. Pt left with RN and daughter in room, setup for lunch with bed in chair position. Pt / daughter return verbalizes understanding of education provided t/o session. Pt is progressing toward OT goals and continues to benefit from skilled OT services to maximize return to PLOF and minimize risk of future falls, injury, caregiver burden, and readmission. Will continue to follow POC as written. Discharge recommendation remains appropriate.        If plan is discharge home, recommend the following:  A lot of help with walking and/or transfers;A lot of help with bathing/dressing/bathroom;Assistance with cooking/housework;Assistance with  feeding;Direct supervision/assist for medications management;Direct supervision/assist for financial management;Help with stairs or ramp for entrance;Assist for transportation;Supervision due to cognitive status   Equipment Recommendations  None recommended by OT    Recommendations for Other Services Other (comment)    Precautions / Restrictions Precautions Precautions: Fall Restrictions Weight Bearing Restrictions: No       Mobility Bed Mobility Overal bed mobility: Needs Assistance Bed Mobility: Supine to Sit, Sit to Supine   Sidelying to sit: Min assist, Used rails, HOB elevated Supine to sit: Min assist Sit to supine: Min assist, Used rails, HOB elevated (cues for sequencing and technique)        Transfers                         Balance Overall balance assessment: History of Falls, Needs assistance Sitting-balance support: Feet supported, Single extremity supported, No upper extremity supported Sitting balance-Leahy Scale: Good Sitting balance - Comments:  (leans forward to don socks no LOB)                                   ADL either performed or assessed with clinical judgement   ADL Overall ADL's : Needs assistance/impaired Eating/Feeding: Set up;Sitting   Grooming: Wash/dry hands;Wash/dry face;Set up;Sitting               Lower Body Dressing: Minimal assistance;Cueing for compensatory techniques;Sitting/lateral leans;Contact guard assist Lower Body Dressing Details (indicate cue type and reason): dons socks EOB; initally cues for threading and technique. second sock donned cga using figure four technique as taught  Functional mobility during ADLs: Minimal assistance General ADL Comments: Benefits from rest breaks directed by OT; improved task performance and overall tolerance      Cognition Arousal: Alert Behavior During Therapy: WFL for tasks assessed/performed Overall Cognitive Status: Within Functional  Limits for tasks assessed Area of Impairment: Following commands, Problem solving                       Following Commands: Follows one step commands inconsistently     Problem Solving: Slow processing, Decreased initiation, Requires verbal cues, Requires tactile cues, Difficulty sequencing General Comments: AO x2 with occasional asks about discharge plans and where daughter is        Exercises Exercises: Other exercises Other Exercises Other Exercises: Lateral scooting towards HOB with minA, cuing for technique and body mechanices Other Exercises: Educated daughter on ways to improve IND participation; discussed function + rationale for tasks performed and activity analysis for appropriate challenge for pt's level            Pertinent Vitals/ Pain       Pain Assessment Pain Assessment: No/denies pain   Frequency  Min 1X/week        Progress Toward Goals  OT Goals(current goals can now be found in the care plan section)  Progress towards OT goals: Progressing toward goals  Acute Rehab OT Goals OT Goal Formulation: With patient/family Time For Goal Achievement: 09/23/23 Potential to Achieve Goals: Fair  Plan         AM-PAC OT "6 Clicks" Daily Activity     Outcome Measure   Help from another person eating meals?: A Little Help from another person taking care of personal grooming?: A Little Help from another person toileting, which includes using toliet, bedpan, or urinal?: A Lot Help from another person bathing (including washing, rinsing, drying)?: A Lot Help from another person to put on and taking off regular upper body clothing?: A Lot Help from another person to put on and taking off regular lower body clothing?: A Lot 6 Click Score: 14    End of Session Equipment Utilized During Treatment: Other (comment)  OT Visit Diagnosis: Unsteadiness on feet (R26.81);History of falling (Z91.81);Muscle weakness (generalized) (M62.81)   Activity Tolerance  Patient tolerated treatment well (c/o nausea, RN aware)   Patient Left in bed;with call bell/phone within reach;with bed alarm set;with nursing/sitter in room;with family/visitor present   Nurse Communication Mobility status        Time: 1610-9604 OT Time Calculation (min): 24 min  Charges: OT General Charges $OT Visit: 1 Visit OT Treatments $Self Care/Home Management : 23-37 mins  Cassell Voorhies L. Ellarae Nevitt, OTR/L  09/12/23, 12:42 PM

## 2023-09-12 NOTE — Progress Notes (Signed)
Speech Language Pathology Treatment: Dysphagia  Patient Details Name: Amy [name] MRN: [id number] DOB: [birth year] Today's Date: 09/12/2023 Time: 1050-1120 SLP Time Calculation (min) (ACUTE ONLY): 30 min  Assessment / Plan / Recommendation Clinical Impression  Pt seen for toleration of diet consistency. Pt awake, verbal. Cognitive decline apparent in her responses. She appeared min anxious about "getting better". Stuffed bear present. Pt has suspected mild Cognitive decline, per Daugher's agreement, MD report.  On RA; afebrile. WBC WNL.   Pt appears to present w/ functional oropharyngeal phase swallowing w/ only slightly, min increased oral phase time w/ increased textured foods -- min lengthy mastication w/ certain textures(tougher ones) suspected during tx sessions. No immediate, overt clinical s/s of aspiration during the po trials. Pt also has a Large Hiatal Hernia dx'd(per Imaging) w/ suspected slower Esophageal phase motility and impact from such.   Pt appears at reduced risk for aspiration when following general aspiration precautions and supported w/ a more soft, chopped foods(Meats Minced) in her diet for ease of chewing/intake overall. Supervision at meals for support and follow through w/ precautions.  Pt appears to have challenging factors that could impact her oropharyngeal swallowing to include discomfort(NSG aware), fatigue/weakness, Mild Cognitive decline, and advanced age as well as current need for support w/ feeding at meals. ALSO: pt has a Dx'd Large Hiatal Hernia -- ANY Esophageal phase Dysmotility can increase risk for Retrograde flow and aspiration of REFLUX material. These factors can increase risk for aspiration, dysphagia as well as decreased oral intake overall.    During po trials, pt consumed trials of moistened soft solids, purees, and thin liquids VIA CUP w/ no immediate, overt coughing, decline in vocal quality, or change in respiratory presentation during/post  trials. Rest breaks were given intermittently b/t trials to support baseline Pulmonary status and Esophageal clearing. Oral phase appeared WFL bolus management, mastication of softened solids - overall functional w/ oral clearing achieved w/ all trials.  Pt held Cup to feed self w/ setup support.    Recommend continue a more Mech Soft diet w/ Meats Minced w/ well-moistened foods(Dtr stated she was "cutting up her foods real small" at home); Thin liquids via Cup -- pt should help to [employer] when drinking. Recommend general aspiration precautions, tray setup and sitting support. Reduce distractions at meals. Rest Breaks for conservation of energy. REFLUX PRECAUTIONS d/t Large Hiatal Hernia. Pills CRUSHED in Puree for safer, easier swallowing.    Education given on Pills in Puree; food consistencies and easy to eat options; general aspiration precautions to pt; posted in room/chart. Handout given. No further ST services indicated currently. MD/NSG updated, agreed. Recommend Dietician f/u for support. Recommend Palliative Care consult for GOC discussion per Daughter's description of overall decline in functioning at advanced age.      HPI HPI: Pt is a 89 y.o. female with medical history significant for HTN, nocturnal tachycardia on diltiazem, plasmacytoma not on treatment and history of recurrent UTIs frequent falls,  who was brought from [facility] for evaluation of weakness cough and increasing confusion developing over the past few days.  Daughter Amy [name] daughter said she had had complained of right flank pain.  States that she was placed in ALF 2 weeks prior due to frequent falls and feels like she has not been adjusting well.  Patient who has foot drop and uses a brace and a walker had not been as ambulant as she used to be over the past several weeks and also had a chronic cough and  been sitting up in the recliner to sleep.  Since going to the facility they have been putting her in the bed to sleep.   Daughter states that she has overall been declining in recent weeks requiring move to [place](prior to that, pt had been staying w/ the 3 dtrs on different days).   CT Of Chest: Dense areas of linear consolidation at the lung bases  with associated volume loss consistent with atelectasis. There are  trace bilateral partially loculated pleural effusions. No  pneumothorax. Central airways are patent.  Large Hiatal Hernia.      SLP Plan  All goals met      Recommendations for follow up therapy are one component of a multi-disciplinary discharge planning process, led by the attending physician.  Recommendations may be updated based on patient status, additional functional criteria and insurance authorization.    Recommendations  Diet recommendations: Dysphagia 3 (mechanical soft);Thin liquid Liquids provided via: Cup;No straw Medication Administration: Crushed with puree Supervision: Patient able to self feed;Staff to assist with self feeding;Intermittent supervision to cue for compensatory strategies Compensations: Minimize environmental distractions;Slow rate;Small sips/bites;Lingual sweep for clearance of pocketing;Follow solids with liquid Postural Changes and/or Swallow Maneuvers: Out of bed for meals;Upright 30-60 min after meal;Seated upright 90 degrees                 (Dietician f/u; Palliative Care f/u for GOC and education) Oral care BID;Oral care before and after PO;Staff/trained caregiver to provide oral care (denture care)   Frequent or constant Supervision/Assistance (Cognitive decline baseline) Dysphagia, unspecified (R13.10) (Esophageal phase dysmotility suspected; Large Hiatal Hernia per chart; Cognitive decline)     All goals met      Jerilynn Som, MS, CCC-SLP Speech Language Pathologist Rehab Services; [facility] [place] [phone] (ascom) Amy [name]  09/12/2023, 12:43 PM

## 2023-09-12 NOTE — Discharge Summary (Signed)
Physician Discharge Summary   Patient: Amy [name] MRN: [id number] DOB: 12/09/1929  Admit date:     09/08/2023  Discharge date: 09/12/23  Discharge Physician: Arnetha Courser   PCP: Danella Penton, MD   Recommendations at discharge:  Please obtain CBC and BMP in 1 week Follow-up with primary care provider within a week  Discharge Diagnoses: Principal Problem:   Sepsis [facility]) Active Problems:   Pneumonia   Acute respiratory failure with hypoxia (HCC)   Chronic cough   Acute metabolic encephalopathy   Acute renal failure superimposed on stage 3a chronic kidney disease (HCC)   Right flank pain   History of recurrent UTI (urinary tract infection)   Hyponatremia   Hypochloremia   CAD in native artery   Benign essential HTN   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HCC)   Hx of deep vein thrombophlebitis of lower extremity   Stenosis of left subclavian artery (HCC)   Chronic tachycardia   Frailty   Acute encephalopathy   Hypoxia   Hospital Course: Amy [name] is a 89 y.o. female with medical history significant for HTN, nocturnal tachycardia on diltiazem, plasmacytoma not on treatment and history of recurrent UTIs frequent falls,  who was brought from [facility] for evaluation of weakness cough and increasing confusion developing over the past few days.  Daughter [name] daughter said she had had complained of right flank pain.  States that she was placed in ALF 2 weeks prior due to frequent falls and feels like she has not been adjusting well.  Patient who has foot drop and uses a brace and a walker had not been as ambulant as she used to be over the past several weeks and also had a chronic cough and been sitting up in the recliner to sleep.  Since going to the facility they have been putting her in the bed to sleep.  States that she has overall been declining. Was febrile with EMS at 101.3.  Arrived on 4 L satting around 91%.  No obvious consolidation on CT chest, however, given reported fever,  cough and hypoxia and procal 2.09, will treat as PNA with abx. Started on vanc/cefe/azithro on presentation.  Blood cultures remain negative but they were obtained after starting antibiotics.  Later vancomycin was discontinued and cefepime was switched to ceftriaxone.  Patient completed a 5-day course of ceftriaxone and azithromycin.  She was also started on prednisone for persistent and bothersome cough, she is being discharged on prednisone with a slow taper.  Patient need to follow-up with outpatient pulmonology if continue to have bothersome cough for further evaluation.  Her hypoxia improved and currently saturating well on room air.  Patient did had right flank pain with history of recurrent UTI.  CT abdomen and pelvis with no acute abnormalities and urine culture with no growth.  Patient did had some AKI on presentation which improved with IV fluid.  As patient was recently moved to ALF with history of frequent falls, having difficulty with adjustment.  Appetite remained poor.  ALF will not be able to take her back before short-term rehab where she is being discharged for further management.  Patient also has an history of chronic nocturnal tachycardia, her prior metoprolol was recently switched to diltiazem by PCP which she will continue on discharge.  Patient was also has an history of deep vein thrombophlebitis of lower extremity, CTA negative for PE.  Patient has an history of left subclavian high-grade stenosis, given age and frailty will not amenable to intervention.  Patient will continue on current medications and need to have a close follow-up with her providers for further management.    Assessment and Plan: * Sepsis (HCC) Pneumonia History of chronic cough, QuantiFERON gold negative on 08/18/2023 Acute respiratory failure with hypoxia Sepsis criteria fever, leukocytosis, hypoxia, infection COVID flu and RSV negative CT negative for pneumonia but showing bibasilar  atelectasis and small bilateral partially loculated pleural effusions Sepsis fluids Complete the course of antibiotic for pneumonia.  Acute metabolic encephalopathy Secondary to sepsis Resolved  Right flank pain History of recurrent UTIs CT abdomen and pelvis with no acute findings Urine cultures negative.  Acute renal failure superimposed on stage 3a chronic kidney disease (HCC) Creatinine 1.26 up from baseline of 1.0  Resolved  Hyponatremia Hypochloremia Possibly related to thiazide diuretic, mild dehydration from poor oral intake since moving into assisted living 2 weeks prior    Frailty Frequent falls Foot drop/uses brace and ambulate walker Daughter states that patient was falling a lot and they made a decision to place her in an ALF but states she has not been adjusting well and has poor appetite as she does not like the food PT OT consult and nutritionist eval Patient is being discharged to short-term rehab before moving back to ALF  Chronic tachycardia Patient has nocturnal tachycardia and recently had her metoprolol switched to diltiazem by PCP Continue diltiazem  Stenosis of left subclavian artery (HCC) High-grade stenosis proximal left subclavian artery Incidental finding Given age and frailty likely not amenable to intervention but can consider discussing with vascular  Hx of deep vein thrombophlebitis of lower extremity CTA negative for PE  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HCC) In remission.  Followed at [facility]    CAD in native artery Continue aspirin, losartan  Consultants: None Procedures performed: None Disposition: Skilled nursing facility Diet recommendation:  Discharge Diet Orders (From admission, onward)     Start     Ordered   09/12/23 0000  Diet - low sodium heart healthy        09/12/23 1147           Dysphagia type 3 thin Liquid DISCHARGE MEDICATION: Allergies as of 09/12/2023       Reactions   Doxycycline Hyclate Nausea Only   Levofloxacin  Nausea And Vomiting   Sulfa Antibiotics Rash   Other Reaction: Throat feels funny        Medication List     TAKE these medications    aspirin EC 81 MG tablet Take 1 tablet by mouth daily.   diltiazem 120 MG 24 hr capsule Commonly known as: CARDIZEM CD Take 120 mg by mouth 2 (two) times daily.   esomeprazole 40 MG capsule Commonly known as: NEXIUM Take 40 mg by mouth daily.   guaiFENesin-dextromethorphan 100-10 MG/5ML syrup Commonly known as: ROBITUSSIN DM Take 5 mLs by mouth every 4 (four) hours as needed for cough.   hydrOXYzine 25 MG tablet Commonly known as: ATARAX Take 1 tablet (25 mg total) by mouth 3 (three) times daily as needed for anxiety.   melatonin 5 MG Tabs Take 0.5 tablets (2.5 mg total) by mouth at bedtime.   predniSONE 10 MG tablet Commonly known as: DELTASONE Take 4 tablets (40 mg total) by mouth daily with breakfast. For 3 days, then decrease 1 tablet every 3-day, continue 10 mg for 5 days and then stop   PRESERVISION AREDS PO Take by mouth.   torsemide 10 MG tablet Commonly known as: DEMADEX Take 1 tablet (10 mg total) by mouth daily  as needed (Edema). What changed:  when to take this reasons to take this Another medication with the same name was removed. Continue taking this medication, and follow the directions you see here.        Contact information for follow-up providers     Danella Penton, MD. Schedule an appointment as soon as possible for a visit in 1 week(s).   Specialty: Internal Medicine Contact information: [phone] [facility] [address] [facility] [place] [address] [phone]              Contact information for after-discharge care     Destination     [facility] .   Service: Skilled Nursing Contact information: [address] [facility] [place] [address] [phone]                    Discharge Exam: Ceasar Mons Weights   09/08/23 1912  Weight:  65.3 kg   General.  Frail elderly lady, in no acute distress. Pulmonary.  Lungs clear bilaterally, normal respiratory effort. CV.  Regular rate and rhythm, no JVD, rub or murmur. Abdomen.  Soft, nontender, nondistended, BS positive. CNS.  Alert and oriented .  No focal neurologic deficit. Extremities.  No edema, no cyanosis, pulses intact and symmetrical. Psychiatry.  Judgment and insight appears normal.   Condition at discharge: stable  The results of significant diagnostics from this hospitalization (including imaging, microbiology, ancillary and laboratory) are listed below for reference.   Imaging Studies: CT Angio Chest Pulmonary Embolism (PE) W or WO Contrast  Result Date: 09/10/2023 CLINICAL DATA:  Positive D-dimer EXAM: CT ANGIOGRAPHY CHEST WITH CONTRAST TECHNIQUE: Multidetector CT imaging of the chest was performed using the standard protocol during bolus administration of intravenous contrast. Multiplanar CT image reconstructions and MIPs were obtained to evaluate the vascular anatomy. RADIATION DOSE REDUCTION: This exam was performed according to the departmental dose-optimization program which includes automated exposure control, adjustment of the mA and/or kV according to patient size and/or use of iterative reconstruction technique. CONTRAST:  75mL OMNIPAQUE IOHEXOL 350 MG/ML SOLN COMPARISON:  CT from 2 days previous. FINDINGS: Cardiovascular: Thoracic aorta demonstrates atherosclerotic calcifications without aneurysmal dilatation. Proximal dissection is seen. The descending thoracic aorta is somewhat limited in its degree of opacification. The heart is mildly enlarged in size. The pulmonary artery shows a normal branching pattern bilaterally. No filling defects to suggest pulmonary emboli are identified. The overall appearance is similar to that seen 2 days previous. Coronary calcifications are noted. Stable narrowing of the proximal left subclavian artery is noted Mediastinum/Nodes:  Thoracic inlet is within normal limits. The esophagus as visualized is within normal limits. A stable subcarinal lymph node is noted unchanged from the prior exam likely reactive in nature. Lungs/Pleura: Bilateral pleural effusions are identified which appears slightly larger than that seen on the prior exam now extending into the major fissure on the right. Apical scarring is noted on the right stable from the prior exam. No sizable parenchymal nodule is seen. Mild basilar atelectasis is noted bilaterally. Upper Abdomen: Visualized upper abdomen shows evidence of a large hiatal hernia stable from the prior study. No other focal abnormality is seen. Musculoskeletal: Multiple healed rib fractures are noted on the right. No acute fracture is noted. Changes of prior vertebral augmentation are noted at T11 and T12. Review of the MIP images confirms the above findings. IMPRESSION: No evidence of pulmonary emboli. Increasing bilateral pleural effusions with associated lower lobe atelectasis. Large hiatal  hernia stable from the prior exam. Stable prominent subcarinal lymph node likely reactive in nature. Aortic Atherosclerosis (ICD10-I70.0). Electronically Signed   By: Alcide Clever M.D.   On: 09/10/2023 01:05   CT Head Wo Contrast  Result Date: 09/08/2023 CLINICAL DATA:  Mental status changes, unknown cause. Weakness, coughing and abdominal and back pain. EXAM: CT HEAD WITHOUT CONTRAST TECHNIQUE: Contiguous axial images were obtained from the base of the skull through the vertex without intravenous contrast. RADIATION DOSE REDUCTION: This exam was performed according to the departmental dose-optimization program which includes automated exposure control, adjustment of the mA and/or kV according to patient size and/or use of iterative reconstruction technique. COMPARISON:  Head CT 04/18/2021 FINDINGS: Brain: There is moderately advanced cerebral atrophy with atrophic ventriculomegaly and mild-to-moderate small vessel  disease of the cerebral white matter. Relatively mild cerebellar atrophy. There is no midline shift. There is no new asymmetry concerning for an acute infarct, hemorrhage, mass or mass effect. Basal cisterns are clear. Vascular: There are calcifications in the distal vertebral arteries and siphons. No hyperdense central vessel is seen. Skull: Negative for fractures or focal lesions. Sinuses/Orbits: Clear sinuses with midline nasal septum. Negative orbits apart from old lens replacements. Prior diffuse right mastoid effusion has resolved in the interval. Still seen is moderate patchy fluid in the mid to lower left mastoid air cells. Both middle ears are clear. Other: A multilevel dorsal cervical spine fusion construct is partially visible. This was seen previously. IMPRESSION: 1. No acute intracranial CT findings or interval changes. 2. Chronic changes. 3. Continued left mastoid effusion with interval resolution of prior right mastoid effusion. Electronically Signed   By: Almira Bar M.D.   On: 09/08/2023 21:56   CT Angio Chest PE W/Cm &/Or Wo Cm  Result Date: 09/08/2023 CLINICAL DATA:  Recurrent urinary tract infection, right flank pain, cough, weakness, back pain EXAM: CT ANGIOGRAPHY CHEST CT ABDOMEN AND PELVIS WITH CONTRAST TECHNIQUE: Multidetector CT imaging of the chest was performed using the standard protocol during bolus administration of intravenous contrast. Multiplanar CT image reconstructions and MIPs were obtained to evaluate the vascular anatomy. Multidetector CT imaging of the abdomen and pelvis was performed using the standard protocol during bolus administration of intravenous contrast. RADIATION DOSE REDUCTION: This exam was performed according to the departmental dose-optimization program which includes automated exposure control, adjustment of the mA and/or kV according to patient size and/or use of iterative reconstruction technique. CONTRAST:  75mL OMNIPAQUE IOHEXOL 350 MG/ML SOLN  COMPARISON:  09/08/2023, 12/17/2016, 09/12/2016 FINDINGS: CTA CHEST FINDINGS Cardiovascular: This is a technically adequate evaluation of the pulmonary vasculature. No filling defects or pulmonary emboli. Heart is unremarkable without pericardial effusion. No evidence of thoracic aortic aneurysm or dissection. There is atherosclerosis of the aorta and coronary vasculature. There is high-grade stenosis within the proximal left subclavian artery, estimated 70-90%. Mediastinum/Nodes: Nonspecific subcarinal adenopathy measuring up to 14 mm in short axis. No other pathologically enlarged lymph nodes. Thyroid, trachea, and esophagus are unremarkable. Large hiatal hernia. Lungs/Pleura: Dense areas of linear consolidation at the lung bases with associated volume loss consistent with atelectasis. There are trace bilateral partially loculated pleural effusions. No pneumothorax. Central airways are patent. Musculoskeletal: Prior healed right rib fractures. Chronic T11 and T12 compression deformities with evidence of prior vertebral augmentation. No acute bony abnormalities. Reconstructed images demonstrate no additional findings. Review of the MIP images confirms the above findings. CT ABDOMEN and PELVIS FINDINGS Hepatobiliary: Gallbladder is surgically absent, with likely postsurgical dilatation of the common bile duct  measuring up to 14 mm. No evidence of choledocholithiasis. The liver is unremarkable. Pancreas: Unremarkable. No pancreatic ductal dilatation or surrounding inflammatory changes. Spleen: Normal in size without focal abnormality. Adrenals/Urinary Tract: No acute or significant renal findings. No urinary tract calculi or obstruction. Chronic 4 mm parenchymal calcification upper pole right kidney. The adrenals and bladder are unremarkable. Stomach/Bowel: No bowel obstruction or ileus. Appendix is likely surgically absent. Diverticulosis of the sigmoid colon without evidence of diverticulitis. No bowel wall  thickening or inflammatory change. Vascular/Lymphatic: Aortic atherosclerosis. No enlarged abdominal or pelvic lymph nodes. Reproductive: Status post hysterectomy. No adnexal masses. Other: No free fluid or free intraperitoneal gas. No abdominal wall hernia. Musculoskeletal: No acute or destructive bony abnormalities. Severe bilateral hip osteoarthritis. Reconstructed images demonstrate no additional findings. Review of the MIP images confirms the above findings. IMPRESSION: Chest: 1. No evidence of pulmonary embolus. 2. Bibasilar atelectasis, with small bilateral partially loculated pleural effusions. 3. Large hiatal hernia. 4. Nonspecific subcarinal lymphadenopathy. 5. Aortic Atherosclerosis (ICD10-I70.0), with focal high-grade stenosis within the proximal left subclavian artery estimated 70-90%. Abdomen/pelvis: 1. No acute intra-abdominal or intrapelvic process. 2. Sigmoid diverticulosis without diverticulitis. 3.  Aortic Atherosclerosis (ICD10-I70.0). Electronically Signed   By: Sharlet Salina M.D.   On: 09/08/2023 21:49   CT ABDOMEN PELVIS W CONTRAST  Result Date: 09/08/2023 CLINICAL DATA:  Recurrent urinary tract infection, right flank pain, cough, weakness, back pain EXAM: CT ANGIOGRAPHY CHEST CT ABDOMEN AND PELVIS WITH CONTRAST TECHNIQUE: Multidetector CT imaging of the chest was performed using the standard protocol during bolus administration of intravenous contrast. Multiplanar CT image reconstructions and MIPs were obtained to evaluate the vascular anatomy. Multidetector CT imaging of the abdomen and pelvis was performed using the standard protocol during bolus administration of intravenous contrast. RADIATION DOSE REDUCTION: This exam was performed according to the departmental dose-optimization program which includes automated exposure control, adjustment of the mA and/or kV according to patient size and/or use of iterative reconstruction technique. CONTRAST:  75mL OMNIPAQUE IOHEXOL 350 MG/ML SOLN  COMPARISON:  09/08/2023, 12/17/2016, 09/12/2016 FINDINGS: CTA CHEST FINDINGS Cardiovascular: This is a technically adequate evaluation of the pulmonary vasculature. No filling defects or pulmonary emboli. Heart is unremarkable without pericardial effusion. No evidence of thoracic aortic aneurysm or dissection. There is atherosclerosis of the aorta and coronary vasculature. There is high-grade stenosis within the proximal left subclavian artery, estimated 70-90%. Mediastinum/Nodes: Nonspecific subcarinal adenopathy measuring up to 14 mm in short axis. No other pathologically enlarged lymph nodes. Thyroid, trachea, and esophagus are unremarkable. Large hiatal hernia. Lungs/Pleura: Dense areas of linear consolidation at the lung bases with associated volume loss consistent with atelectasis. There are trace bilateral partially loculated pleural effusions. No pneumothorax. Central airways are patent. Musculoskeletal: Prior healed right rib fractures. Chronic T11 and T12 compression deformities with evidence of prior vertebral augmentation. No acute bony abnormalities. Reconstructed images demonstrate no additional findings. Review of the MIP images confirms the above findings. CT ABDOMEN and PELVIS FINDINGS Hepatobiliary: Gallbladder is surgically absent, with likely postsurgical dilatation of the common bile duct measuring up to 14 mm. No evidence of choledocholithiasis. The liver is unremarkable. Pancreas: Unremarkable. No pancreatic ductal dilatation or surrounding inflammatory changes. Spleen: Normal in size without focal abnormality. Adrenals/Urinary Tract: No acute or significant renal findings. No urinary tract calculi or obstruction. Chronic 4 mm parenchymal calcification upper pole right kidney. The adrenals and bladder are unremarkable. Stomach/Bowel: No bowel obstruction or ileus. Appendix is likely surgically absent. Diverticulosis of the sigmoid colon without evidence of diverticulitis.  No bowel wall  thickening or inflammatory change. Vascular/Lymphatic: Aortic atherosclerosis. No enlarged abdominal or pelvic lymph nodes. Reproductive: Status post hysterectomy. No adnexal masses. Other: No free fluid or free intraperitoneal gas. No abdominal wall hernia. Musculoskeletal: No acute or destructive bony abnormalities. Severe bilateral hip osteoarthritis. Reconstructed images demonstrate no additional findings. Review of the MIP images confirms the above findings. IMPRESSION: Chest: 1. No evidence of pulmonary embolus. 2. Bibasilar atelectasis, with small bilateral partially loculated pleural effusions. 3. Large hiatal hernia. 4. Nonspecific subcarinal lymphadenopathy. 5. Aortic Atherosclerosis (ICD10-I70.0), with focal high-grade stenosis within the proximal left subclavian artery estimated 70-90%. Abdomen/pelvis: 1. No acute intra-abdominal or intrapelvic process. 2. Sigmoid diverticulosis without diverticulitis. 3.  Aortic Atherosclerosis (ICD10-I70.0). Electronically Signed   By: Sharlet Salina M.D.   On: 09/08/2023 21:49   DG Chest Port 1 View  Result Date: 09/08/2023 CLINICAL DATA:  Sepsis, weakness, cough EXAM: PORTABLE CHEST 1 VIEW COMPARISON:  11/12/2020 FINDINGS: Single frontal view of the chest demonstrates an unremarkable cardiac silhouette. There is bibasilar consolidation and small bilateral pleural effusions, left greater than right. No pneumothorax. No acute bony abnormalities. IMPRESSION: 1. Bibasilar consolidation and small bilateral pleural effusions, left greater than right. This could reflect underlying atelectasis or pneumonia. Electronically Signed   By: Sharlet Salina M.D.   On: 09/08/2023 20:51    Microbiology: Results for orders placed or performed during the hospital encounter of 09/08/23  Culture, blood (Routine x 2)     Status: None (Preliminary result)   Collection Time: 09/08/23  8:34 PM   Specimen: BLOOD  Result Value Ref Range Status   Specimen Description BLOOD BLOOD  RIGHT HAND  Final   Special Requests   Final    BOTTLES DRAWN AEROBIC AND ANAEROBIC Blood Culture adequate volume   Culture   Final    NO GROWTH 4 DAYS Performed at [facility], [address] [place], [place] [address]    Report Status PENDING  Incomplete  Culture, blood (Routine x 2)     Status: None (Preliminary result)   Collection Time: 09/08/23  8:34 PM   Specimen: BLOOD  Result Value Ref Range Status   Specimen Description BLOOD BLOOD RIGHT ARM  Final   Special Requests   Final    BOTTLES DRAWN AEROBIC AND ANAEROBIC Blood Culture results may not be optimal due to an excessive volume of blood received in culture bottles   Culture   Final    NO GROWTH 4 DAYS Performed at [facility], [address], [place], [place] [address]    Report Status PENDING  Incomplete  Resp panel by RT-PCR (RSV, Flu A&B, Covid) Anterior Nasal Swab     Status: None   Collection Time: 09/08/23  9:37 PM   Specimen: Anterior Nasal Swab  Result Value Ref Range Status   SARS Coronavirus 2 by RT PCR NEGATIVE NEGATIVE Final    Comment: (NOTE) SARS-CoV-2 target nucleic acids are NOT DETECTED.  The SARS-CoV-2 RNA is generally detectable in upper respiratory specimens during the acute phase of infection. The lowest concentration of SARS-CoV-2 viral copies this assay can detect is 138 copies/mL. A negative result does not preclude SARS-Cov-2 infection and should not be used as the sole basis for treatment or other patient management decisions. A negative result may occur with  improper specimen collection/handling, submission of specimen other than nasopharyngeal swab, presence of viral mutation(s) within the areas targeted by this assay, and inadequate number of viral copies(<138 copies/mL). A negative result  must be combined with clinical observations, patient history, and epidemiological information. The expected result is Negative.  Fact Sheet for Patients:   BloggerCourse.com  Fact Sheet for Healthcare Providers:  SeriousBroker.it  This test is no t yet approved or cleared by the [place] FDA and  has been authorized for detection and/or diagnosis of SARS-CoV-2 by FDA under an Emergency Use Authorization (EUA). This EUA will remain  in effect (meaning this test can be used) for the duration of the COVID-19 declaration under Section 564(b)(1) of the Act, 21 U.S.C.section 360bbb-3(b)(1), unless the authorization is terminated  or revoked sooner.       Influenza A by PCR NEGATIVE NEGATIVE Final   Influenza B by PCR NEGATIVE NEGATIVE Final    Comment: (NOTE) The Xpert Xpress SARS-CoV-2/FLU/RSV plus assay is intended as an aid in the diagnosis of influenza from Nasopharyngeal swab specimens and should not be used as a sole basis for treatment. Nasal washings and aspirates are unacceptable for Xpert Xpress SARS-CoV-2/FLU/RSV testing.  Fact Sheet for Patients: BloggerCourse.com  Fact Sheet for Healthcare Providers: SeriousBroker.it  This test is not yet approved or cleared by the [place] FDA and has been authorized for detection and/or diagnosis of SARS-CoV-2 by FDA under an Emergency Use Authorization (EUA). This EUA will remain in effect (meaning this test can be used) for the duration of the COVID-19 declaration under Section 564(b)(1) of the Act, 21 U.S.C. section 360bbb-3(b)(1), unless the authorization is terminated or revoked.     Resp Syncytial Virus by PCR NEGATIVE NEGATIVE Final    Comment: (NOTE) Fact Sheet for Patients: BloggerCourse.com  Fact Sheet for Healthcare Providers: SeriousBroker.it  This test is not yet approved or cleared by the [place] FDA and has been authorized for detection and/or diagnosis of SARS-CoV-2 by FDA under an Emergency Use  Authorization (EUA). This EUA will remain in effect (meaning this test can be used) for the duration of the COVID-19 declaration under Section 564(b)(1) of the Act, 21 U.S.C. section 360bbb-3(b)(1), unless the authorization is terminated or revoked.  Performed at [facility], [address], [place], [place] [address]   Urine Culture     Status: None   Collection Time: 09/09/23  5:27 AM   Specimen: Urine, Random  Result Value Ref Range Status   Specimen Description   Final    URINE, RANDOM Performed at [facility], [address] [place], [place] [address]    Special Requests   Final    NONE Reflexed from [id number] Performed at [facility], [address] [place], [place] [address]    Culture   Final    NO GROWTH Performed at [facility], [address], [place], [place] [address]    Report Status 09/10/2023 FINAL  Final  MRSA Next Gen by PCR, Nasal     Status: None   Collection Time: 09/09/23  1:51 PM   Specimen: Nasal Mucosa; Nasal Swab  Result Value Ref Range Status   MRSA by PCR Next Gen NOT DETECTED NOT DETECTED Final    Comment: (NOTE) The GeneXpert MRSA Assay (FDA approved for NASAL specimens only), is one component of a comprehensive MRSA colonization surveillance program. It is not intended to diagnose MRSA infection nor to guide or monitor treatment for MRSA infections. Test performance is not FDA approved in patients less than 44 years old. Performed at [facility], [address], [place], [place] [address]   Respiratory (~20 pathogens) panel by PCR  Status: None   Collection Time: 09/09/23  1:55 PM  Result Value Ref Range Status   Adenovirus NOT DETECTED NOT DETECTED Final   Coronavirus 229E NOT DETECTED NOT DETECTED Final    Comment: (NOTE) The Coronavirus on the Respiratory Panel, DOES NOT test for the novel  Coronavirus (2019 nCoV)    Coronavirus HKU1 NOT DETECTED NOT DETECTED Final   Coronavirus  NL63 NOT DETECTED NOT DETECTED Final   Coronavirus OC43 NOT DETECTED NOT DETECTED Final   Metapneumovirus NOT DETECTED NOT DETECTED Final   Rhinovirus / Enterovirus NOT DETECTED NOT DETECTED Final   Influenza A NOT DETECTED NOT DETECTED Final   Influenza B NOT DETECTED NOT DETECTED Final   Parainfluenza Virus 1 NOT DETECTED NOT DETECTED Final   Parainfluenza Virus 2 NOT DETECTED NOT DETECTED Final   Parainfluenza Virus 3 NOT DETECTED NOT DETECTED Final   Parainfluenza Virus 4 NOT DETECTED NOT DETECTED Final   Respiratory Syncytial Virus NOT DETECTED NOT DETECTED Final   Bordetella pertussis NOT DETECTED NOT DETECTED Final   Bordetella Parapertussis NOT DETECTED NOT DETECTED Final   Chlamydophila pneumoniae NOT DETECTED NOT DETECTED Final   Mycoplasma pneumoniae NOT DETECTED NOT DETECTED Final    Comment: Performed at [facility], [address], [place], [place] [address]    Labs: CBC: Recent Labs  Lab 09/08/23 1915 09/09/23 0219 09/10/23 0611 09/11/23 0348  WBC 14.9* 13.0* 9.1 8.3  NEUTROABS 12.3*  --   --   --   HGB 13.8 11.3* 11.0* 11.4*  HCT 45.9 34.4* 34.6* 35.4*  MCV 95.4 88.4 89.2 87.0  PLT 250 215 247 277   Basic Metabolic Panel: Recent Labs  Lab 09/08/23 1915 09/09/23 0219 09/10/23 0611 09/11/23 0348  NA 130* 133* 135 138  K 4.0 3.6 3.5 3.6  CL 88* 93* 97* 97*  CO2 24 29 28 27   GLUCOSE 176* 155* 102* 124*  BUN 32* 29* 30* 24*  CREATININE 1.27* 1.10* 0.75 0.60  CALCIUM 8.9 8.5* 8.6* 8.9  MG  --   --  2.0 2.1   Liver Function Tests: Recent Labs  Lab 09/08/23 1915  AST 16  ALT 19  ALKPHOS 139*  BILITOT 1.1  PROT 7.1  ALBUMIN 3.0*   CBG: Recent Labs  Lab 09/09/23 2132  GLUCAP 127*    Discharge time spent: greater than 30 minutes.  This record has been created using Conservation officer, historic buildings. Errors have been sought and corrected,but may not always be located. Such creation errors do not reflect on the standard of care.    Signed: Arnetha Courser, MD [facility] 09/12/2023

## 2023-09-12 NOTE — TOC Transition Note (Addendum)
Transition of Care [facility]) - CM/SW Discharge Note   Patient Details  Name: Madalynne Utech MRN: [id number] Date of Birth: 04/28/1929  Transition of Care [facility]) CM/SW Contact:  Chapman Fitch, RN Phone Number: 09/12/2023, 12:02 PM   Clinical Narrative:        Patient will DC to: [facility] Anticipated DC date: 09/12/23  Family notified: Daughter Dois Davenport at bedside, and bedside RN to notify  Transport by: [facility]  [place] notified   Per MD patient ready for DC to . RN,, patient's family, and facility notified of DC. Discharge Summary sent to facility. RN given number for report. DC packet on chart. Ambulance transport requested for patient.  TOC signing off.      Patient Goals and CMS Choice      Discharge Placement                         Discharge Plan and Services Additional resources added to the After Visit Summary for                                       Social Determinants of Health (SDOH) Interventions SDOH Screenings   Food Insecurity: No Food Insecurity (09/09/2023)  Housing: Low Risk  (09/09/2023)  Transportation Needs: No Transportation Needs (09/09/2023)  Utilities: Not At Risk (09/09/2023)  Tobacco Use: Low Risk  (09/08/2023)     Readmission Risk Interventions     No data to display

## 2023-09-12 NOTE — Hospital Course (Signed)
Amy [name] is a 89 y.o. female with medical history significant for HTN, nocturnal tachycardia on diltiazem, plasmacytoma not on treatment and history of recurrent UTIs frequent falls,  who was brought from [facility] for evaluation of weakness cough and increasing confusion developing over the past few days.  Daughter [name] daughter said she had had complained of right flank pain.  States that she was placed in ALF 2 weeks prior due to frequent falls and feels like she has not been adjusting well.  Patient who has foot drop and uses a brace and a walker had not been as ambulant as she used to be over the past several weeks and also had a chronic cough and been sitting up in the recliner to sleep.  Since going to the facility they have been putting her in the bed to sleep.  States that she has overall been declining. Was febrile with EMS at 101.3.  Arrived on 4 L satting around 91%.  No obvious consolidation on CT chest, however, given reported fever, cough and hypoxia and procal 2.09, will treat as PNA with abx. Started on vanc/cefe/azithro on presentation.  Blood cultures remain negative but they were obtained after starting antibiotics.  Later vancomycin was discontinued and cefepime was switched to ceftriaxone.  Patient completed a 5-day course of ceftriaxone and azithromycin.  She was also started on prednisone for persistent and bothersome cough, she is being discharged on prednisone with a slow taper.  Patient need to follow-up with outpatient pulmonology if continue to have bothersome cough for further evaluation.  Her hypoxia improved and currently saturating well on room air.  Patient did had right flank pain with history of recurrent UTI.  CT abdomen and pelvis with no acute abnormalities and urine culture with no growth.  Patient did had some AKI on presentation which improved with IV fluid.  As patient was recently moved to ALF with history of frequent falls, having difficulty  with adjustment.  Appetite remained poor.  ALF will not be able to take her back before short-term rehab where she is being discharged for further management.  Patient also has an history of chronic nocturnal tachycardia, her prior metoprolol was recently switched to diltiazem by PCP which she will continue on discharge.  Patient was also has an history of deep vein thrombophlebitis of lower extremity, CTA negative for PE.  Patient has an history of left subclavian high-grade stenosis, given age and frailty will not amenable to intervention.  Patient will continue on current medications and need to have a close follow-up with her providers for further management.

## 2023-09-12 NOTE — Progress Notes (Signed)
Patients daughter Jasmine December was contacted to advise patient is leaving with EMS. Report called to [facility]  Johnny Bridge RN) gave report. Patient daughter took all personal belongings, PIVs removed and tolerated well. Patient transported via EMS report and vitals taken bedside.

## 2023-09-13 LAB — CULTURE, BLOOD (ROUTINE X 2)
Culture: NO GROWTH
Culture: NO GROWTH
Special Requests: ADEQUATE

## 2023-09-14 ENCOUNTER — Other Ambulatory Visit: Payer: Self-pay

## 2023-09-14 ENCOUNTER — Emergency Department: Payer: Medicare Other

## 2023-09-14 ENCOUNTER — Inpatient Hospital Stay
Admission: EM | Admit: 2023-09-14 | Discharge: 2023-09-22 | DRG: 177 | Disposition: A | Discharge status: Skilled Nursing Facility | Payer: Medicare Other | Source: Skilled Nursing Facility | Attending: Obstetrics and Gynecology | Admitting: Obstetrics and Gynecology

## 2023-09-14 ENCOUNTER — Encounter: Payer: Self-pay | Admitting: Internal Medicine

## 2023-09-14 DIAGNOSIS — Z1152 Encounter for screening for COVID-19: Secondary | ICD-10-CM

## 2023-09-14 DIAGNOSIS — I340 Nonrheumatic mitral (valve) insufficiency: Secondary | ICD-10-CM | POA: Diagnosis present

## 2023-09-14 DIAGNOSIS — E782 Mixed hyperlipidemia: Secondary | ICD-10-CM | POA: Diagnosis present

## 2023-09-14 DIAGNOSIS — Z86718 Personal history of other venous thrombosis and embolism: Secondary | ICD-10-CM

## 2023-09-14 DIAGNOSIS — Z66 Do not resuscitate: Secondary | ICD-10-CM | POA: Diagnosis present

## 2023-09-14 DIAGNOSIS — R0602 Shortness of breath: Secondary | ICD-10-CM | POA: Diagnosis not present

## 2023-09-14 DIAGNOSIS — R1310 Dysphagia, unspecified: Secondary | ICD-10-CM | POA: Diagnosis present

## 2023-09-14 DIAGNOSIS — Z79899 Other long term (current) drug therapy: Secondary | ICD-10-CM | POA: Diagnosis not present

## 2023-09-14 DIAGNOSIS — M4852XD Collapsed vertebra, not elsewhere classified, cervical region, subsequent encounter for fracture with routine healing: Secondary | ICD-10-CM | POA: Diagnosis present

## 2023-09-14 DIAGNOSIS — R531 Weakness: Secondary | ICD-10-CM | POA: Diagnosis not present

## 2023-09-14 DIAGNOSIS — Z8701 Personal history of pneumonia (recurrent): Secondary | ICD-10-CM | POA: Diagnosis not present

## 2023-09-14 DIAGNOSIS — F0394 Unspecified dementia, unspecified severity, with anxiety: Secondary | ICD-10-CM | POA: Diagnosis present

## 2023-09-14 DIAGNOSIS — J9601 Acute respiratory failure with hypoxia: Secondary | ICD-10-CM | POA: Diagnosis present

## 2023-09-14 DIAGNOSIS — F32A Depression, unspecified: Secondary | ICD-10-CM | POA: Diagnosis present

## 2023-09-14 DIAGNOSIS — K5900 Constipation, unspecified: Secondary | ICD-10-CM | POA: Diagnosis present

## 2023-09-14 DIAGNOSIS — A419 Sepsis, unspecified organism: Secondary | ICD-10-CM | POA: Diagnosis present

## 2023-09-14 DIAGNOSIS — M4802 Spinal stenosis, cervical region: Secondary | ICD-10-CM | POA: Diagnosis present

## 2023-09-14 DIAGNOSIS — J690 Pneumonitis due to inhalation of food and vomit: Secondary | ICD-10-CM | POA: Diagnosis present

## 2023-09-14 DIAGNOSIS — Z882 Allergy status to sulfonamides status: Secondary | ICD-10-CM

## 2023-09-14 DIAGNOSIS — K449 Diaphragmatic hernia without obstruction or gangrene: Secondary | ICD-10-CM | POA: Diagnosis present

## 2023-09-14 DIAGNOSIS — J918 Pleural effusion in other conditions classified elsewhere: Secondary | ICD-10-CM | POA: Diagnosis present

## 2023-09-14 DIAGNOSIS — Z7982 Long term (current) use of aspirin: Secondary | ICD-10-CM

## 2023-09-14 DIAGNOSIS — I700 Atherosclerosis of aorta: Secondary | ICD-10-CM | POA: Diagnosis present

## 2023-09-14 DIAGNOSIS — I10 Essential (primary) hypertension: Secondary | ICD-10-CM | POA: Diagnosis present

## 2023-09-14 DIAGNOSIS — F419 Anxiety disorder, unspecified: Secondary | ICD-10-CM | POA: Diagnosis present

## 2023-09-14 DIAGNOSIS — N200 Calculus of kidney: Secondary | ICD-10-CM | POA: Diagnosis present

## 2023-09-14 DIAGNOSIS — R5381 Other malaise: Secondary | ICD-10-CM | POA: Diagnosis present

## 2023-09-14 DIAGNOSIS — I2510 Atherosclerotic heart disease of native coronary artery without angina pectoris: Secondary | ICD-10-CM | POA: Diagnosis present

## 2023-09-14 DIAGNOSIS — J189 Pneumonia, unspecified organism: Principal | ICD-10-CM

## 2023-09-14 DIAGNOSIS — F0393 Unspecified dementia, unspecified severity, with mood disturbance: Secondary | ICD-10-CM | POA: Diagnosis present

## 2023-09-14 DIAGNOSIS — D72829 Elevated white blood cell count, unspecified: Secondary | ICD-10-CM | POA: Insufficient documentation

## 2023-09-14 DIAGNOSIS — J9811 Atelectasis: Secondary | ICD-10-CM | POA: Diagnosis present

## 2023-09-14 DIAGNOSIS — Z8249 Family history of ischemic heart disease and other diseases of the circulatory system: Secondary | ICD-10-CM

## 2023-09-14 DIAGNOSIS — I959 Hypotension, unspecified: Secondary | ICD-10-CM | POA: Diagnosis not present

## 2023-09-14 DIAGNOSIS — Z825 Family history of asthma and other chronic lower respiratory diseases: Secondary | ICD-10-CM

## 2023-09-14 DIAGNOSIS — F418 Other specified anxiety disorders: Secondary | ICD-10-CM | POA: Insufficient documentation

## 2023-09-14 DIAGNOSIS — Z8781 Personal history of (healed) traumatic fracture: Secondary | ICD-10-CM

## 2023-09-14 DIAGNOSIS — Z888 Allergy status to other drugs, medicaments and biological substances status: Secondary | ICD-10-CM

## 2023-09-14 DIAGNOSIS — K219 Gastro-esophageal reflux disease without esophagitis: Secondary | ICD-10-CM | POA: Diagnosis present

## 2023-09-14 DIAGNOSIS — Z8672 Personal history of thrombophlebitis: Secondary | ICD-10-CM

## 2023-09-14 LAB — URINALYSIS, ROUTINE W REFLEX MICROSCOPIC
Bilirubin Urine: NEGATIVE
Glucose, UA: NEGATIVE mg/dL
Hgb urine dipstick: NEGATIVE
Ketones, ur: NEGATIVE mg/dL
Nitrite: NEGATIVE
Protein, ur: NEGATIVE mg/dL
Specific Gravity, Urine: 1.015 (ref 1.005–1.030)
pH: 7 (ref 5.0–8.0)

## 2023-09-14 LAB — COMPREHENSIVE METABOLIC PANEL
ALT: 37 U/L (ref 0–44)
AST: 40 U/L (ref 15–41)
Albumin: 2.8 g/dL — ABNORMAL LOW (ref 3.5–5.0)
Alkaline Phosphatase: 101 U/L (ref 38–126)
Anion gap: 10 (ref 5–15)
BUN: 19 mg/dL (ref 8–23)
CO2: 25 mmol/L (ref 22–32)
Calcium: 8.9 mg/dL (ref 8.9–10.3)
Chloride: 100 mmol/L (ref 98–111)
Creatinine, Ser: 0.69 mg/dL (ref 0.44–1.00)
GFR, Estimated: >60 mL/min (ref >60)
Glucose, Bld: 106 mg/dL — ABNORMAL HIGH (ref 70–99)
Potassium: 4.3 mmol/L (ref 3.5–5.1)
Sodium: 135 mmol/L (ref 135–145)
Total Bilirubin: 0.6 mg/dL (ref 0.3–1.2)
Total Protein: 6.4 g/dL — ABNORMAL LOW (ref 6.5–8.1)

## 2023-09-14 LAB — CBC WITH DIFFERENTIAL/PLATELET
Abs Immature Granulocytes: 1.25 10*3/uL — ABNORMAL HIGH (ref 0.00–0.07)
Basophils Absolute: 0.1 10*3/uL (ref 0.0–0.1)
Basophils Relative: 1 %
Eosinophils Absolute: 0.1 10*3/uL (ref 0.0–0.5)
Eosinophils Relative: 1 %
HCT: 40.8 % (ref 36.0–46.0)
Hemoglobin: 12.6 g/dL (ref 12.0–15.0)
Immature Granulocytes: 6 %
Lymphocytes Relative: 6 %
Lymphs Abs: 1.3 10*3/uL (ref 0.7–4.0)
MCH: 28.4 pg (ref 26.0–34.0)
MCHC: 30.9 g/dL (ref 30.0–36.0)
MCV: 91.9 fL (ref 80.0–100.0)
Monocytes Absolute: 0.6 10*3/uL (ref 0.1–1.0)
Monocytes Relative: 3 %
Neutro Abs: 18.2 10*3/uL — ABNORMAL HIGH (ref 1.7–7.7)
Neutrophils Relative %: 83 %
Platelets: 396 10*3/uL (ref 150–400)
RBC: 4.44 MIL/uL (ref 3.87–5.11)
RDW: 14.8 % (ref 11.5–15.5)
Smear Review: NORMAL
WBC: 21.6 10*3/uL — ABNORMAL HIGH (ref 4.0–10.5)
nRBC: 0.1 % (ref 0.0–0.2)

## 2023-09-14 LAB — TROPONIN I (HIGH SENSITIVITY)
Troponin I (High Sensitivity): 6 ng/L (ref <18)
Troponin I (High Sensitivity): 7 ng/L (ref <18)

## 2023-09-14 LAB — RESP PANEL BY RT-PCR (RSV, FLU A&B, COVID)  RVPGX2
Influenza A by PCR: NEGATIVE
Influenza B by PCR: NEGATIVE
Resp Syncytial Virus by PCR: NEGATIVE
SARS Coronavirus 2 by RT PCR: NEGATIVE

## 2023-09-14 LAB — PROCALCITONIN: Procalcitonin: <0.1 ng/mL

## 2023-09-14 LAB — LACTIC ACID, PLASMA: Lactic Acid, Venous: 1.5 mmol/L (ref 0.5–1.9)

## 2023-09-14 MED ORDER — MORPHINE SULFATE (PF) 2 MG/ML IV SOLN
2.0000 mg | INTRAVENOUS | Status: AC | PRN
  Administered 2023-09-15: 2 mg via INTRAVENOUS
  Filled 2023-09-14: qty 1

## 2023-09-14 MED ORDER — SODIUM CHLORIDE 0.9 % IV SOLN: 500.0000 mg | INTRAVENOUS | Status: DC

## 2023-09-14 MED ORDER — DULOXETINE HCL 20 MG PO CPEP
20.0000 mg | ORAL_CAPSULE | Freq: Every day | ORAL | Status: DC
  Administered 2023-09-15 – 2023-09-22 (×8): 20 mg via ORAL
  Filled 2023-09-14 (×8): qty 1

## 2023-09-14 MED ORDER — LIDOCAINE 5 % EX PTCH: 1.0000 | MEDICATED_PATCH | Freq: Every day | CUTANEOUS | Status: DC | PRN

## 2023-09-14 MED ORDER — IOHEXOL 350 MG/ML SOLN
60.0000 mL | Freq: Once | INTRAVENOUS | Status: AC | PRN
  Administered 2023-09-14: 60 mL via INTRAVENOUS

## 2023-09-14 MED ORDER — ASPIRIN 81 MG PO TBEC
81.0000 mg | DELAYED_RELEASE_TABLET | Freq: Every day | ORAL | Status: DC
  Administered 2023-09-15 – 2023-09-22 (×8): 81 mg via ORAL
  Filled 2023-09-14 (×8): qty 1

## 2023-09-14 MED ORDER — ENOXAPARIN SODIUM 40 MG/0.4ML IJ SOSY
40.0000 mg | PREFILLED_SYRINGE | INTRAMUSCULAR | Status: DC
  Administered 2023-09-14 – 2023-09-21 (×8): 40 mg via SUBCUTANEOUS
  Filled 2023-09-14 (×8): qty 0.4

## 2023-09-14 MED ORDER — ONDANSETRON HCL 4 MG/2ML IJ SOLN: 4.0000 mg | Freq: Four times a day (QID) | INTRAMUSCULAR | Status: AC | PRN

## 2023-09-14 MED ORDER — ACETAMINOPHEN 325 MG PO TABS
650.0000 mg | ORAL_TABLET | Freq: Four times a day (QID) | ORAL | Status: AC | PRN
  Administered 2023-09-16 – 2023-09-20 (×6): 650 mg via ORAL
  Filled 2023-09-14 (×8): qty 2

## 2023-09-14 MED ORDER — DILTIAZEM HCL ER COATED BEADS 120 MG PO CP24
120.0000 mg | ORAL_CAPSULE | Freq: Two times a day (BID) | ORAL | Status: DC
  Administered 2023-09-14 – 2023-09-15 (×3): 120 mg via ORAL
  Filled 2023-09-14 (×3): qty 1

## 2023-09-14 MED ORDER — VANCOMYCIN HCL 750 MG/150ML IV SOLN: 750.0000 mg | INTRAVENOUS | Status: DC

## 2023-09-14 MED ORDER — GUAIFENESIN-DM 100-10 MG/5ML PO SYRP: 5.0000 mL | ORAL_SOLUTION | Freq: Four times a day (QID) | ORAL | Status: DC | PRN

## 2023-09-14 MED ORDER — ACETAMINOPHEN 650 MG RE SUPP: 650.0000 mg | Freq: Four times a day (QID) | RECTAL | Status: AC | PRN

## 2023-09-14 MED ORDER — TORSEMIDE 20 MG PO TABS: 10.0000 mg | ORAL_TABLET | Freq: Every day | ORAL | Status: DC | PRN

## 2023-09-14 MED ORDER — SODIUM CHLORIDE 0.9 % IV BOLUS (SEPSIS)
500.0000 mL | Freq: Once | INTRAVENOUS | Status: AC
  Administered 2023-09-14: 500 mL via INTRAVENOUS

## 2023-09-14 MED ORDER — SODIUM CHLORIDE 0.9 % IV SOLN
500.0000 mg | Freq: Once | INTRAVENOUS | Status: AC
  Administered 2023-09-14: 500 mg via INTRAVENOUS
  Filled 2023-09-14 (×2): qty 5

## 2023-09-14 MED ORDER — SENNOSIDES-DOCUSATE SODIUM 8.6-50 MG PO TABS: 1.0000 | ORAL_TABLET | Freq: Every evening | ORAL | Status: DC | PRN

## 2023-09-14 MED ORDER — MELATONIN 5 MG PO TABS
2.5000 mg | ORAL_TABLET | Freq: Every day | ORAL | Status: DC
  Administered 2023-09-14 – 2023-09-21 (×8): 2.5 mg via ORAL
  Filled 2023-09-14 (×8): qty 1

## 2023-09-14 MED ORDER — HYDROXYZINE HCL 25 MG PO TABS: 25.0000 mg | ORAL_TABLET | Freq: Three times a day (TID) | ORAL | Status: DC | PRN

## 2023-09-14 MED ORDER — ADULT MULTIVITAMIN W/MINERALS CH
1.0000 | ORAL_TABLET | Freq: Every day | ORAL | Status: DC
  Administered 2023-09-15 – 2023-09-22 (×8): 1 via ORAL
  Filled 2023-09-14 (×8): qty 1

## 2023-09-14 MED ORDER — VANCOMYCIN HCL IN DEXTROSE 1-5 GM/200ML-% IV SOLN: 1000.0000 mg | Freq: Once | INTRAVENOUS | Status: DC

## 2023-09-14 MED ORDER — SODIUM CHLORIDE 0.9 % IV SOLN: 2.0000 g | Freq: Two times a day (BID) | INTRAVENOUS | Status: DC

## 2023-09-14 MED ORDER — MORPHINE SULFATE (PF) 4 MG/ML IV SOLN
4.0000 mg | Freq: Once | INTRAVENOUS | Status: AC
  Administered 2023-09-14: 4 mg via INTRAVENOUS
  Filled 2023-09-14: qty 1

## 2023-09-14 MED ORDER — VANCOMYCIN HCL 1500 MG/300ML IV SOLN
1500.0000 mg | Freq: Once | INTRAVENOUS | Status: AC
  Administered 2023-09-14: 1500 mg via INTRAVENOUS
  Filled 2023-09-14: qty 300

## 2023-09-14 MED ORDER — SODIUM CHLORIDE 0.9 % IV SOLN
2.0000 g | Freq: Once | INTRAVENOUS | Status: AC
  Administered 2023-09-14: 2 g via INTRAVENOUS
  Filled 2023-09-14: qty 12.5

## 2023-09-14 MED ORDER — ONDANSETRON HCL 4 MG PO TABS: 4.0000 mg | ORAL_TABLET | Freq: Four times a day (QID) | ORAL | Status: AC | PRN

## 2023-09-14 NOTE — Consult Note (Signed)
PHARMACY -  BRIEF ANTIBIOTIC NOTE   Pharmacy has received consult(s) for cefepime and vancomycin from an ED provider.  The patient's profile has been reviewed for ht/wt/allergies/indication/available labs.    One time order(s) placed for cefepime 2 grams IV x 1 and vancomycin 1000 mg IV x 1  Further antibiotics/pharmacy consults should be ordered by admitting physician if indicated.                       Thank you, Barrie Folk 09/14/2023  3:44 PM

## 2023-09-14 NOTE — Consult Note (Signed)
CODE SEPSIS - PHARMACY COMMUNICATION  **Broad Spectrum Antibiotics should be administered within 1 hour of Sepsis diagnosis**  Time Code Sepsis Called/Page Received: 1511  Antibiotics Ordered: cefepime and vancomycin  Time of 1st antibiotic administration: 1523  Additional action taken by pharmacy: N/A   Barrie Folk ,PharmD Clinical Pharmacist  09/14/2023  3:45 PM

## 2023-09-14 NOTE — Assessment & Plan Note (Signed)
At T11 and T12 status post kyphoplasty Patient states that sometimes she has discomfort surrounding that area Lidocaine patch daily as needed ordered

## 2023-09-14 NOTE — Assessment & Plan Note (Signed)
Home duloxetine 20 mg daily resume Hydroxyzine 25 mg 3 times daily as needed for anxiety

## 2023-09-14 NOTE — ED Triage Notes (Signed)
Pt arrives via [facility] from SNF for SOB and worsening cough. Pt dx with pneumonia 2 days ago. Pt spo2 95% on her baseline oxygen 2lpm.

## 2023-09-14 NOTE — Consult Note (Signed)
Pharmacy Antibiotic Note  Amy [name] is a 89 y.o. female admitted on 09/14/2023 with sepsis.  Pharmacy has been consulted for cefepime and vancomycin dosing.  Plan: Give vancomycin 1500 mg IV x 1, followed by vancomycin 750 mg IV every 24 hours Estimated AUC 420, Cmin 11.2 IBW, Scr rounded to 0.8, Vd 0.72 Start cefepime 2 grams IV every 12 hours Vancomycin levels at steady state or as clinically indicated Azithromycin 500 mg IV every 24 hours per provider Follow renal function for further adjustments     Temp (24hrs), Avg:98.2 F (36.8 C), Min:98.2 F (36.8 C), Max:98.2 F (36.8 C)  Recent Labs  Lab 09/08/23 1915 09/08/23 2034 09/09/23 0219 09/10/23 0611 09/11/23 0348 09/14/23 1133 09/14/23 1215  WBC 14.9*  --  13.0* 9.1 8.3 21.6*  --   CREATININE 1.27*  --  1.10* 0.75 0.60 0.69  --   LATICACIDVEN  --  1.8 1.2  --   --   --  1.5    Estimated Creatinine Clearance: 40.4 mL/min (by C-G formula based on SCr of 0.69 mg/dL).    Allergies  Allergen Reactions   Doxycycline Hyclate Nausea Only   Levofloxacin Nausea And Vomiting   Sulfa Antibiotics Rash    Other Reaction: Throat feels funny    Antimicrobials this admission: vancomycin 9/22 >>  cefepime 9/22 >>  Azithromycin 9/22 >>  Dose adjustments this admission: N/A  Microbiology results: 9/22 BCx: pending   Thank you for allowing pharmacy to be a part of this patient's care.  Barrie Folk, PharmD 09/14/2023 3:52 PM

## 2023-09-14 NOTE — Assessment & Plan Note (Addendum)
Repeat CTA in the chest to assess for PE was read as no evidence of PE.  Continued evidence of small bilateral pleural effusion with associated bibasilar atelectasis.  Slightly better aeration in the left lung base.  Stable subcarinal lymph node likely reactive.  Moderate size hiatal hernia.  Aortic atherosclerosis.  Atherosclerotic coronary disease.  Nonobstructing right renal stone.  Stable prominence of common bile duct and central intrahepatic ducts. Incentive spirometry, flutter valve for small bilateral pleural effusion, too small for thoracentesis consideration Consult placed to respiratory team to educate patient on appropriate use of intact spirometry and flutter valve

## 2023-09-14 NOTE — Assessment & Plan Note (Signed)
Generalized Fall precautions PT, OT, TOC consulted

## 2023-09-14 NOTE — Progress Notes (Signed)
Patient arrived to room 239B from ED.  Assessment complete, VS obtained, and Admission database began.

## 2023-09-14 NOTE — Sepsis Progress Note (Signed)
Sepsis protocol is being followed by [facility]. 

## 2023-09-14 NOTE — ED Provider Notes (Signed)
[facility] Provider Note    Event Date/Time   First MD Initiated Contact with Patient 09/14/23 1117     (approximate)   History   Chief Complaint Shortness of Breath and Cough   HPI  Amy [name] is a 89 y.o. female with past medical history of hypertension, CAD, CKD, and DVT who presents to the ED complaining of cough.  Patient was recently admitted to the hospital for pneumonia and completed a course of Rocephin and azithromycin for pneumonia.  She states that since returning home to her assisted living facility, she has continued to cough with pain in her chest.  She reports severe pain across both sides in the anterior portion of her chest, especially when she coughs or takes a deep breath.  She has not been able to cough anything up, but does state that she feels slightly short of breath.  She has not had any fevers and denies any pain or swelling in her legs.  She has not had any pain in her abdomen, nausea, vomiting, or diarrhea.     Physical Exam   Triage Vital Signs: ED Triage Vitals  Encounter Vitals Group     BP 09/14/23 1124 135/76     Systolic BP Percentile --      Diastolic BP Percentile --      Pulse Rate 09/14/23 1124 88     Resp 09/14/23 1124 17     Temp 09/14/23 1124 98.2 F (36.8 C)     Temp Source 09/14/23 1124 Oral     SpO2 09/14/23 1124 98 %     Weight --      Height --      Head Circumference --      Peak Flow --      Pain Score 09/14/23 1121 10     Pain Loc --      Pain Education --      Exclude from Growth Chart --     Most recent vital signs: Vitals:   09/14/23 1430 09/14/23 1445  BP: (!) 151/80   Pulse: 87 86  Resp: 18 19  Temp:    SpO2: 98% 97%    Constitutional: Alert and oriented. Eyes: Conjunctivae are normal. Head: Atraumatic. Nose: No congestion/rhinnorhea. Mouth/Throat: Mucous membranes are moist.  Cardiovascular: Normal rate, regular rhythm. Grossly normal heart sounds.  2+ radial pulses  bilaterally. Respiratory: Normal respiratory effort.  No retractions. Lungs CTAB.  Bilateral chest wall tenderness to palpation noted. Gastrointestinal: Soft and nontender. No distention. Musculoskeletal: No lower extremity tenderness nor edema.  Neurologic:  Normal speech and language. No gross focal neurologic deficits are appreciated.    ED Results / Procedures / Treatments   Labs (all labs ordered are listed, but only abnormal results are displayed) Labs Reviewed  CBC WITH DIFFERENTIAL/PLATELET - Abnormal; Notable for the following components:      Result Value   WBC 21.6 (*)    Neutro Abs 18.2 (*)    Abs Immature Granulocytes 1.25 (*)    All other components within normal limits  COMPREHENSIVE METABOLIC PANEL - Abnormal; Notable for the following components:   Glucose, Bld 106 (*)    Total Protein 6.4 (*)    Albumin 2.8 (*)    All other components within normal limits  RESP PANEL BY RT-PCR (RSV, FLU A&B, COVID)  RVPGX2  CULTURE, BLOOD (ROUTINE X 2)  CULTURE, BLOOD (ROUTINE X 2)  PROCALCITONIN  LACTIC ACID, PLASMA  URINALYSIS, ROUTINE W  REFLEX MICROSCOPIC  TROPONIN I (HIGH SENSITIVITY)  TROPONIN I (HIGH SENSITIVITY)     EKG  ED ECG REPORT I, Chesley Noon, the attending physician, personally viewed and interpreted this ECG.   Date: 09/14/2023  EKG Time: 11:28  Rate: 91  Rhythm: normal sinus rhythm  Axis: LAD  Intervals:none  ST&T Change: LVH  RADIOLOGY CTA chest reviewed and interpreted by me with no pulmonary embolism, bilateral lower lobe effusion and infiltrate noted.  PROCEDURES:  Critical Care performed: Yes, see critical care procedure note(s)  .Critical Care  Performed by: Chesley Noon, MD Authorized by: Chesley Noon, MD   Critical care provider statement:    Critical care time (minutes):  30   Critical care time was exclusive of:  Separately billable procedures and treating other patients and teaching time   Critical care was necessary  to treat or prevent imminent or life-threatening deterioration of the following conditions:  Respiratory failure   Critical care was time spent personally by me on the following activities:  Development of treatment plan with patient or surrogate, discussions with consultants, evaluation of patient's response to treatment, examination of patient, ordering and review of laboratory studies, ordering and review of radiographic studies, ordering and performing treatments and interventions, pulse oximetry, re-evaluation of patient's condition and review of old charts   I assumed direction of critical care for this patient from another provider in my specialty: no     Care discussed with: admitting provider      MEDICATIONS ORDERED IN ED: Medications  vancomycin (VANCOCIN) IVPB 1000 mg/200 mL premix (has no administration in time range)  ceFEPIme (MAXIPIME) 2 g in sodium chloride 0.9 % 100 mL IVPB (2 g Intravenous New Bag/Given 09/14/23 1523)  azithromycin (ZITHROMAX) 500 mg in sodium chloride 0.9 % 250 mL IVPB (has no administration in time range)  acetaminophen (TYLENOL) tablet 650 mg (has no administration in time range)    Or  acetaminophen (TYLENOL) suppository 650 mg (has no administration in time range)  ondansetron (ZOFRAN) tablet 4 mg (has no administration in time range)    Or  ondansetron (ZOFRAN) injection 4 mg (has no administration in time range)  enoxaparin (LOVENOX) injection 40 mg (has no administration in time range)  sodium chloride 0.9 % bolus 500 mL (has no administration in time range)  senna-docusate (Senokot-S) tablet 1 tablet (has no administration in time range)  azithromycin (ZITHROMAX) 500 mg in sodium chloride 0.9 % 250 mL IVPB (has no administration in time range)  morphine (PF) 4 MG/ML injection 4 mg (4 mg Intravenous Given 09/14/23 1211)  iohexol (OMNIPAQUE) 350 MG/ML injection 60 mL (60 mLs Intravenous Contrast Given 09/14/23 1453)     IMPRESSION / MDM / ASSESSMENT  AND PLAN / ED COURSE  I reviewed the triage vital signs and the nursing notes.                              89 y.o. female with past medical history of hypertension, CAD, CKD, and DVT who presents to the ED complaining of cough, difficulty breathing, and pain in her chest when coughing over the past 4 days.  Patient's presentation is most consistent with acute presentation with potential threat to life or bodily function.  Differential diagnosis includes, but is not limited to, ACS, PE, pneumonia, pneumothorax, musculoskeletal pain, viral illness, anemia, electrolyte abnormality, AKI.  Patient nontoxic-appearing and in no acute distress, vital signs are unremarkable.  She arrives wearing  2 L nasal cannula but per chart does not wear oxygen at baseline, will attempt to wean this off.  She complains of significant pain with deep breath and with cough, did have CTA during recent admission that was negative for PE or pneumonia, however given fever and elevated Pro-Cal she was treated with antibiotics.  We will recheck chest x-ray, EKG shows no evidence of arrhythmia or ischemia and symptoms seem atypical for ACS.  Labs are pending at this time, will treat symptomatically with IV morphine and reassess.  Chest x-ray is unremarkable, CTA of chest performed and is negative for PE but shows ongoing loculated pleural effusions and I am concerned for ongoing pneumonia.  Patient was started on broad-spectrum antibiotics and labs show significant leukocytosis consistent with this.  She continues to require 2 L nasal cannula of supplemental oxygen, but remainder of labs are reassuring with no significant anemia, electrolyte abnormality, or AKI.  Troponin within normal limits, lactic acid and procalcitonin also unremarkable.  Case discussed with hospitalist for admission.      FINAL CLINICAL IMPRESSION(S) / ED DIAGNOSES   Final diagnoses:  Pneumonia due to infectious organism, unspecified laterality,  unspecified part of lung  Acute respiratory failure with hypoxia (HCC)     Rx / DC Orders   ED Discharge Orders     None        Note:  This document was prepared using Dragon voice recognition software and may include unintentional dictation errors.   Chesley Noon, MD 09/14/23 1539

## 2023-09-14 NOTE — Hospital Course (Signed)
[name] is a 89 year old female with history of GERD, hypertension, depression, anxiety, CAD, who presents to the emergency department for chief concerns of worsening shortness of breath and cough.  Vitals in the ED showed temperature of 98.2, respiration rate of 25, heart rate of 86, blood pressure 137/79, SpO2 of 98% on 2 L nasal cannula.  Serum sodium is 135, potassium 4.2, chloride 100, bicarb 25, BUN of 19, serum creatinine of 0.69, EGFR greater than 60, nonfasting blood glucose 106, WBC 21.6, hemoglobin 12.6, platelets of 396.  COVID/influenza A/influenza B/RSV PCR were negative.  High sensitive troponin was 6 and on repeat was 7.  Lactic acid was 1.5.  Procalcitonin was less than 0.10.  ED treatment: Morphine 4 mg IV, azithromycin 500 mg IV one-time dose, cefepime 2 g IV, vancomycin 1 g IV one-time dose.

## 2023-09-14 NOTE — H&P (Signed)
History and Physical   [name] UXL:[id number] DOB: 1929-01-08 DOA: 09/14/2023  PCP: Danella Penton, MD  Outpatient Specialists: Dr. Tiajuana Amass, [facility] clinic cardiology Patient coming from: Short-term nursing facility, via EMS  I have personally briefly reviewed patient's old medical records in [facility] EMR.  Chief Concern: Short of breath and cough  HPI: [name] is a 89 year old female with history of GERD, hypertension, depression, anxiety, CAD, who presents to the emergency department for chief concerns of worsening shortness of breath and cough.  Vitals in the ED showed temperature of 98.2, respiration rate of 25, heart rate of 86, blood pressure 137/79, SpO2 of 98% on 2 L nasal cannula.  Serum sodium is 135, potassium 4.2, chloride 100, bicarb 25, BUN of 19, serum creatinine of 0.69, EGFR greater than 60, nonfasting blood glucose 106, WBC 21.6, hemoglobin 12.6, platelets of 396.  COVID/influenza A/influenza B/RSV PCR were negative.  High sensitive troponin was 6 and on repeat was 7.  Lactic acid was 1.5.  Procalcitonin was less than 0.10.  ED treatment: Morphine 4 mg IV, azithromycin 500 mg IV one-time dose, cefepime 2 g IV, vancomycin 1 g IV one-time dose. ------------------------------------ At bedside, patient patient was able to tell me her name, age, location, current calendar year.  Patient is eating dinner and she states that the piece taste good.  She states she feels better here than she did at the facility.  Patient reports that she was short of breath and had increased cough.  She denies trauma to her person.  She reports that her bilateral shoulder joints hurt her and this happens sometimes.  Granddaughter at bedside states that she was endorsing some thoracic discomfort.  Patient states that this comfort is surrounding the area where she had the compression fracture with a cement fix.  Patient states she forgot all about that.    Patient  is requesting to not go back to that facility.  She endorses generalized weakness.  She also endorses swelling of her lower extremity that is her baseline.  She reports that the swelling and the pain gets worse when she lays in bed all day.    She reports that the facility was not nice to her and did not take good care of her.  They refused to turn off the TV stating that he was not their job. She reports that the facility refused to let her get out of bed and states that she has to stay in bed all day.  Social history: She denies tobacco, EtOH, recreational drug use.  She is retired.  ROS: Constitutional: no weight change, no fever ENT/Mouth: no sore throat, no rhinorrhea Eyes: no eye pain, no vision changes Cardiovascular: no chest pain, + dyspnea,  + edema, no palpitations Respiratory: + cough, no sputum, no wheezing Gastrointestinal: no nausea, no vomiting, no diarrhea, no constipation Genitourinary: no urinary incontinence, no dysuria, no hematuria Musculoskeletal: no arthralgias, no myalgias Skin: no skin lesions, no pruritus, Neuro: + weakness, no loss of consciousness, no syncope Psych: no anxiety, no depression, no decrease appetite Heme/Lymph: no bruising, no bleeding  ED Course: Discussed with emergency medicine provider, patient requiring hospitalization for chief concerns of worsening cough and shortness of breath.  Assessment/Plan  Principal Problem:   Sepsis (HCC) Active Problems:   Atelectasis, bilateral   CAD in native artery   Benign essential HTN   Mixed hyperlipidemia   Hx of deep vein thrombophlebitis of lower extremity   Leukocytosis  History of vertebral compression fracture   Weakness   Shortness of breath   Depression with anxiety   Assessment and Plan:  * Sepsis (HCC) Patient met criteria with leukocytosis, possible source of pneumonia, increased respiration rate Patient on 2 L nasal cannula for comfort, no documented desaturation Blood cultures x  2 are in process, urine analysis has been ordered and pending collection at the time of this dictation Patient is status post azithromycin, cefepime, vancomycin per EDP Low clinical suspicion the patient has infectious etiology at this time Discontinue cefepime and vancomycin per pharmacy AM team to order antibiotics when the benefits outweigh the risk Aspiration precautions Admit to PCU, inpatient  Atelectasis, bilateral Incentive spirometry and flutter valve every 2 hours while awake, 45 days ordered Respiratory care team consulted to teach patient on appropriate incentive parameter and flutter valve  Depression with anxiety Home duloxetine 20 mg daily resume Hydroxyzine 25 mg 3 times daily as needed for anxiety  Shortness of breath Repeat CTA in the chest to assess for PE was read as no evidence of PE.  Continued evidence of small bilateral pleural effusion with associated bibasilar atelectasis.  Slightly better aeration in the left lung base.  Stable subcarinal lymph node likely reactive.  Moderate size hiatal hernia.  Aortic atherosclerosis.  Atherosclerotic coronary disease.  Nonobstructing right renal stone.  Stable prominence of common bile duct and central intrahepatic ducts. Incentive spirometry, flutter valve for small bilateral pleural effusion, too small for thoracentesis consideration Consult placed to respiratory team to educate patient on appropriate use of intact spirometry and flutter valve  Weakness Generalized Fall precautions PT, OT, TOC consulted  History of vertebral compression fracture At T11 and T12 status post kyphoplasty Patient states that sometimes she has discomfort surrounding that area Lidocaine patch daily as needed ordered  Leukocytosis Suspect secondary to demargination in setting of recent steroid use However given increased respiration rate, possible source of persistent pneumonia, blood cultures x 2 were ordered and are in process in the  ED Recheck CBC in a.m.  Benign essential HTN Home diltiazem 120 mg p.o. twice daily resumed  CAD in native artery Aspirin 81 mg daily resumed  Chart reviewed.   Hospitalization from 09/08/23 to 09/12/2023: Patient was admitted for sepsis suspect secondary to pneumonia, patient completed 5 days of antibiotic therapy prior to discharge, cefepime and then ceftriaxone and azithromycin IV.  Patient was sent home to [facility]  DVT prophylaxis: Enoxaparin subcutaneous Code Status: DNI/DNR, MOST form at bedside Diet: Heart healthy Family Communication: Updated daughter, granddaughter, grand son at bedside Disposition Plan: Pending clinical course Consults called: PT, OT, TOC Admission status: PCU, inpatient  Past Medical History:  Diagnosis Date   Arthritis    Depression    Diverticulitis    DVT (deep venous thrombosis) (HCC)    Right leg    Dyspnea    Family history of adverse reaction to anesthesia    PONV sister   GERD (gastroesophageal reflux disease)    History of hiatal hernia    Hyperlipidemia    Hypertension    Mitral valve regurgitation    Neuromuscular disorder (HCC)    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HCC)    Pneumonia    Renal disorder    Past Surgical History:  Procedure Laterality Date   ABDOMINAL HYSTERECTOMY     partial   ABDOMINAL SURGERY     bladder tack  1990   BREAST BIOPSY  1974   CATARACT EXTRACTION, BILATERAL     CERVICAL SPINE  SURGERY     tumor removed   HERNIA REPAIR     KYPHOPLASTY N/A 03/12/2018   Procedure: [id number];  Surgeon: Kennedy Bucker, MD;  Location: [facility] ORS;  Service: Orthopedics;  Laterality: N/A;   KYPHOPLASTY N/A 06/30/2018   Procedure: Shauna Hugh;  Surgeon: Kennedy Bucker, MD;  Location: [facility] ORS;  Service: Orthopedics;  Laterality: N/A;   plasmacytoma resection  2005   lumbar   Social History:  reports that she has never smoked. She has never used smokeless tobacco. She reports that she does not drink alcohol and does not  use drugs.  Allergies  Allergen Reactions   Doxycycline Hyclate Nausea Only   Levofloxacin Nausea And Vomiting   Sulfa Antibiotics Rash    Other Reaction: Throat feels funny   Family History  Problem Relation Age of Onset   Heart failure Mother    Heart failure Father    COPD Sister    Prostate cancer Brother    COPD Brother    Family history: Family history reviewed and not pertinent.  Prior to Admission medications   Medication Sig Start Date End Date Taking? Authorizing Provider  COLD & HOT PLUS MENTHOL 4-1 % PTCH Apply 1 patch topically daily. 09/13/23  Yes [provider]  DULoxetine (CYMBALTA) 20 MG capsule Take 20 mg by mouth daily. 07/27/23  Yes [provider]  metoprolol succinate (TOPROL-XL) 25 MG 24 hr tablet Take 25 mg by mouth in the morning and at bedtime. 07/12/23  Yes [provider]  Multiple Vitamins-Minerals (EYE MULTIVITAMIN/SODIUM) TABS Take 1 tablet by mouth daily. 09/12/23  Yes [provider]  QUEtiapine (SEROQUEL) 50 MG tablet Take 50 mg by mouth 2 (two) times daily. 07/27/23  Yes [provider]  aspirin EC 81 MG tablet Take 1 tablet by mouth daily.    [provider]  diltiazem (CARDIZEM CD) 120 MG 24 hr capsule Take 120 mg by mouth 2 (two) times daily. 08/18/23 08/17/24  [provider]  esomeprazole (NEXIUM) 40 MG capsule Take 40 mg by mouth daily. 07/11/20 09/09/23  [provider]  guaiFENesin-dextromethorphan (ROBITUSSIN DM) 100-10 MG/5ML syrup Take 5 mLs by mouth every 4 (four) hours as needed for cough. 09/12/23   Arnetha Courser, MD  hydrOXYzine (ATARAX) 25 MG tablet Take 1 tablet (25 mg total) by mouth 3 (three) times daily as needed for anxiety. 09/12/23   Arnetha Courser, MD  melatonin 5 MG TABS Take 0.5 tablets (2.5 mg total) by mouth at bedtime. 09/12/23   Arnetha Courser, MD  Multiple Vitamins-Minerals (PRESERVISION AREDS PO) Take by mouth.    [provider]  predniSONE (DELTASONE)  10 MG tablet Take 4 tablets (40 mg total) by mouth daily with breakfast. For 3 days, then decrease 1 tablet every 3-day, continue 10 mg for 5 days and then stop 09/12/23   Arnetha Courser, MD  torsemide (DEMADEX) 10 MG tablet Take 1 tablet (10 mg total) by mouth daily as needed (Edema). 09/12/23   Arnetha Courser, MD   Physical Exam: Vitals:   09/14/23 1630 09/14/23 1700 09/14/23 1730 09/14/23 1742  BP: (!) 178/88 137/69 127/78   Pulse: 89 86 87   Resp: 14 18 16    Temp:    97.6 F (36.4 C)  TempSrc:    Oral  SpO2: 92% 96% 95%    Constitutional: appears age-appropriate, frail, NAD, calm Eyes: PERRL, lids and conjunctivae normal ENMT: Mucous membranes are moist. Posterior pharynx clear of any exudate or lesions. Age-appropriate dentition. Hearing appropriate  Neck: normal, supple, no masses, no thyromegaly Respiratory: clear to auscultation bilaterally, no wheezing, no crackles. Normal respiratory effort. No accessory muscle use.  Nasal cannula in place for comfort Cardiovascular: Regular rate and rhythm, no murmurs / rubs / gallops.  Mild trace bilateral lower extremity edema, per patient this is baseline. 2+ pedal pulses. No carotid bruits.  Abdomen: no tenderness, no masses palpated, no hepatosplenomegaly. Bowel sounds positive.  Musculoskeletal: no clubbing / cyanosis. No joint deformity upper and lower extremities. Good ROM, no contractures, no atrophy. Normal muscle tone.  Skin: no rashes, lesions, ulcers. No induration Neurologic: Sensation intact. Strength 5/5 in all 4.  Psychiatric: Normal judgment and insight. Alert and oriented x 3. Normal mood.   EKG: Ordered by EDP, pending completion  Chest x-ray on Admission: I personally reviewed and I agree with radiologist reading as below.  CT Angio Chest PE W/Cm &/Or Wo Cm  Result Date: 09/14/2023 CLINICAL DATA:  Shortness of breath and worsening cough 2 days. Possible pulmonary embolism. EXAM: CT ANGIOGRAPHY CHEST WITH CONTRAST TECHNIQUE:  Multidetector CT imaging of the chest was performed using the standard protocol during bolus administration of intravenous contrast. Multiplanar CT image reconstructions and MIPs were obtained to evaluate the vascular anatomy. RADIATION DOSE REDUCTION: This exam was performed according to the departmental dose-optimization program which includes automated exposure control, adjustment of the mA and/or kV according to patient size and/or use of iterative reconstruction technique. CONTRAST:  60mL OMNIPAQUE IOHEXOL 350 MG/ML SOLN COMPARISON:  09/10/2023, 09/08/2023 FINDINGS: Cardiovascular: Mild stable cardiomegaly. Moderate calcified plaque over the left main and 3 vessel coronary arteries. Thoracic aorta is normal caliber. There is calcified plaque over the descending thoracic aorta. Pulmonary arterial system is well opacified without evidence of emboli. Stable prominence of the right main pulmonary artery. Remaining vascular structures are unremarkable. Mediastinum/Nodes: 1.4 cm subcarinal lymph node without significant change. No other significant mediastinal or hilar adenopathy. Moderate size hiatal hernia. Lungs/Pleura: Lungs are adequately inflated. Mild stable biapical scarring. Continued evidence of small bilateral pleural effusions with associated bibasilar atelectasis. Slightly better aeration in the left lung base. Airways are unremarkable. Upper Abdomen: Calcified plaque over the abdominal aorta. Nonobstructing 3 mm stone over the upper pole right kidney. Stable prominence of the common bile duct and central intrahepatic ducts. Musculoskeletal: 2 adjacent lower thoracic spine compression fractures post kyphoplasty unchanged. No acute findings. Review of the MIP images confirms the above findings. IMPRESSION: 1. No evidence of pulmonary embolism. 2. Continued evidence of small bilateral pleural effusions with associated bibasilar atelectasis. Slightly better aeration in the left lung base. 3. Stable 1.4 cm  subcarinal lymph node likely reactive. 4. Moderate size hiatal hernia. 5. Aortic atherosclerosis. Atherosclerotic coronary artery disease. 6. Nonobstructing right renal stone. 7. Stable prominence of the common bile duct and central intrahepatic ducts. Aortic Atherosclerosis (ICD10-I70.0). Electronically Signed   By: Elberta Fortis M.D.   On: 09/14/2023 15:32   DG Chest 2 View  Result Date: 09/14/2023 CLINICAL DATA:  Chest pain, shortness of breath, and cough. Hypoxia. EXAM: CHEST - 2 VIEW COMPARISON:  09/08/2023 FINDINGS: Stable mild cardiomegaly. Stable small bilateral pleural effusions. Decreased atelectasis seen in the left lung base since prior exam. Mild right basilar atelectasis shows no significant change. IMPRESSION: Decreased left basilar atelectasis. Stable mild right basilar atelectasis and small bilateral pleural effusions. Electronically Signed   By: Danae Orleans M.D.   On: 09/14/2023 12:28    Labs on Admission: I have personally reviewed following labs CBC: Recent Labs  Lab 09/08/23 1915 09/09/23 0219 09/10/23 0611 09/11/23 0348 09/14/23 1133  WBC 14.9* 13.0* 9.1 8.3 21.6*  NEUTROABS 12.3*  --   --   --  18.2*  HGB 13.8 11.3* 11.0* 11.4* 12.6  HCT 45.9 34.4* 34.6* 35.4* 40.8  MCV 95.4 88.4 89.2 87.0 91.9  PLT 250 215 247 277 396   Basic Metabolic Panel: Recent Labs  Lab 09/08/23 1915 09/09/23 0219 09/10/23 0611 09/11/23 0348 09/14/23 1133  NA 130* 133* 135 138 135  K 4.0 3.6 3.5 3.6 4.3  CL 88* 93* 97* 97* 100  CO2 24 29 28 27 25   GLUCOSE 176* 155* 102* 124* 106*  BUN 32* 29* 30* 24* 19  CREATININE 1.27* 1.10* 0.75 0.60 0.69  CALCIUM 8.9 8.5* 8.6* 8.9 8.9  MG  --   --  2.0 2.1  --    GFR: Estimated Creatinine Clearance: 40.4 mL/min (by C-G formula based on SCr of 0.69 mg/dL).  Liver Function Tests: Recent Labs  Lab 09/08/23 1915 09/14/23 1133  AST 16 40  ALT 19 37  ALKPHOS 139* 101  BILITOT 1.1 0.6  PROT 7.1 6.4*  ALBUMIN 3.0* 2.8*   Coagulation  Profile: Recent Labs  Lab 09/08/23 2034  INR 1.2   CBG: Recent Labs  Lab 09/09/23 2132  GLUCAP 127*   Urine analysis:    Component Value Date/Time   COLORURINE YELLOW (A) 09/09/2023 0527   APPEARANCEUR CLEAR (A) 09/09/2023 0527   LABSPEC 1.030 09/09/2023 0527   PHURINE 5.0 09/09/2023 0527   GLUCOSEU NEGATIVE 09/09/2023 0527   HGBUR NEGATIVE 09/09/2023 0527   BILIRUBINUR NEGATIVE 09/09/2023 0527   BILIRUBINUR neg 05/13/2016 1055   KETONESUR NEGATIVE 09/09/2023 0527   PROTEINUR NEGATIVE 09/09/2023 0527   UROBILINOGEN 0.2 05/13/2016 1055   NITRITE NEGATIVE 09/09/2023 0527   LEUKOCYTESUR TRACE (A) 09/09/2023 0527   This document was prepared using Dragon Voice Recognition software and may include unintentional dictation errors.  Dr. Sedalia Muta [facility]  If 7PM-7AM, please contact overnight-coverage provider If 7AM-7PM, please contact day attending provider [email or web address]  09/14/2023, 6:01 PM

## 2023-09-14 NOTE — Assessment & Plan Note (Signed)
-  Aspirin 81 mg daily resumed

## 2023-09-14 NOTE — Assessment & Plan Note (Signed)
Suspect secondary to demargination in setting of recent steroid use However given increased respiration rate, possible source of persistent pneumonia, blood cultures x 2 were ordered and are in process in the ED Recheck CBC in a.m.

## 2023-09-14 NOTE — Assessment & Plan Note (Addendum)
Patient met criteria with leukocytosis, possible source of pneumonia, increased respiration rate Patient on 2 L nasal cannula for comfort, no documented desaturation Blood cultures x 2 are in process, urine analysis has been ordered and pending collection at the time of this dictation Patient is status post azithromycin, cefepime, vancomycin per EDP Low clinical suspicion the patient has infectious etiology at this time Discontinue cefepime and vancomycin per pharmacy AM team to order antibiotics when the benefits outweigh the risk Aspiration precautions Admit to PCU, inpatient

## 2023-09-14 NOTE — Assessment & Plan Note (Signed)
Home diltiazem 120 mg p.o. twice daily resumed

## 2023-09-14 NOTE — Assessment & Plan Note (Addendum)
Incentive spirometry and flutter valve every 2 hours while awake, 45 days ordered Respiratory care team consulted to teach patient on appropriate incentive parameter and flutter valve

## 2023-09-15 ENCOUNTER — Inpatient Hospital Stay
Admit: 2023-09-15 | Discharge: 2023-09-15 | Disposition: A | Discharge status: Home / Self Care | Payer: Medicare Other | Attending: Obstetrics and Gynecology | Admitting: Obstetrics and Gynecology

## 2023-09-15 ENCOUNTER — Inpatient Hospital Stay: Payer: Medicare Other

## 2023-09-15 DIAGNOSIS — A419 Sepsis, unspecified organism: Secondary | ICD-10-CM | POA: Diagnosis not present

## 2023-09-15 LAB — BASIC METABOLIC PANEL
Anion gap: 8 (ref 5–15)
BUN: 16 mg/dL (ref 8–23)
CO2: 25 mmol/L (ref 22–32)
Calcium: 8.7 mg/dL — ABNORMAL LOW (ref 8.9–10.3)
Chloride: 106 mmol/L (ref 98–111)
Creatinine, Ser: 0.55 mg/dL (ref 0.44–1.00)
GFR, Estimated: >60 mL/min (ref >60)
Glucose, Bld: 89 mg/dL (ref 70–99)
Potassium: 4.4 mmol/L (ref 3.5–5.1)
Sodium: 139 mmol/L (ref 135–145)

## 2023-09-15 LAB — CBC
HCT: 35.7 % — ABNORMAL LOW (ref 36.0–46.0)
Hemoglobin: 11.4 g/dL — ABNORMAL LOW (ref 12.0–15.0)
MCH: 28.4 pg (ref 26.0–34.0)
MCHC: 31.9 g/dL (ref 30.0–36.0)
MCV: 88.8 fL (ref 80.0–100.0)
Platelets: 358 10*3/uL (ref 150–400)
RBC: 4.02 MIL/uL (ref 3.87–5.11)
RDW: 14.7 % (ref 11.5–15.5)
WBC: 14.1 10*3/uL — ABNORMAL HIGH (ref 4.0–10.5)
nRBC: 0 % (ref 0.0–0.2)

## 2023-09-15 LAB — BRAIN NATRIURETIC PEPTIDE: B Natriuretic Peptide: 141.9 pg/mL — ABNORMAL HIGH (ref 0.0–100.0)

## 2023-09-15 MED ORDER — METOPROLOL SUCCINATE ER 25 MG PO TB24
25.0000 mg | ORAL_TABLET | Freq: Every day | ORAL | Status: DC
  Administered 2023-09-15: 25 mg via ORAL
  Filled 2023-09-15: qty 1

## 2023-09-15 MED ORDER — QUETIAPINE FUMARATE 25 MG PO TABS
50.0000 mg | ORAL_TABLET | Freq: Two times a day (BID) | ORAL | Status: DC
  Administered 2023-09-15 – 2023-09-22 (×15): 50 mg via ORAL
  Filled 2023-09-15 (×16): qty 2

## 2023-09-15 MED ORDER — LIDOCAINE 5 % EX PTCH
1.0000 | MEDICATED_PATCH | CUTANEOUS | Status: DC
  Administered 2023-09-15 – 2023-09-21 (×7): 1 via TRANSDERMAL
  Filled 2023-09-15 (×7): qty 1

## 2023-09-15 NOTE — Evaluation (Signed)
Occupational Therapy Evaluation Patient Details Name: Amy [name] MRN: [id number] DOB: 1929-05-20 Today's Date: 09/15/2023   History of Present Illness Amy [name] is a 89 year old female with history of HTN, HLD, DVT, plasmacytoma not on treatment presenting to the emergency department for evaluation of weakness and cough.   Clinical Impression   Pt was seen for OT evaluation this date. Prior to hospital admission, pt was living at [facility], she has been there since September 2024. Prior to being at [facility], family reports she was living with her daughter for 19 years. When living with daughter pt was amb with rollator, however since at ALF pt has been using a WC for stand pivot transfers since attending. Pt/family reports that at ALF pt has assistance with all ADL including being brought to cafe for meals in WC.    In session pt needed MIN A to get to EOB. Pt completed stand-pivot transfer with MOD A bed to recliner. During transfer pt balance was poor, seeming anxious during transfer. Pt would benefit from skilled OT services to address noted impairments and functional limitations (see below for any additional details) in order to maximize safety and independence while minimizing falls risk and caregiver burden. If ALF can provide +1 assist for all transfers, pt may return; if unable then pt will require STR prior to returning to ALF.     If plan is discharge home, recommend the following: A lot of help with walking and/or transfers;A lot of help with bathing/dressing/bathroom;Assistance with cooking/housework;Direct supervision/assist for medications management;Direct supervision/assist for financial management;Help with stairs or ramp for entrance;Assist for transportation    Functional Status Assessment  Patient has had a recent decline in their functional status and demonstrates the ability to make significant improvements in function in a reasonable and predictable  amount of time.  Equipment Recommendations  None recommended by OT    Recommendations for Other Services       Precautions / Restrictions Precautions Precautions: Fall Other Brace: soft AFO (brace) for R LE per chart, not in room with pt at evaluation Restrictions Weight Bearing Restrictions: No      Mobility Bed Mobility Overal bed mobility: Needs Assistance Bed Mobility: Supine to Sit     Supine to sit: Min assist     General bed mobility comments: Pt seated in recliner at the end of session    Transfers Overall transfer level: Needs assistance Equipment used: 2 person hand held assist Transfers: Sit to/from Stand Sit to Stand: Mod assist                  Balance Overall balance assessment: History of Falls, Needs assistance Sitting-balance support: Feet supported, Single extremity supported, No upper extremity supported Sitting balance-Leahy Scale: Good     Standing balance support: Bilateral upper extremity supported, During functional activity, Reliant on assistive device for balance Standing balance-Leahy Scale: Poor                             ADL either performed or assessed with clinical judgement   ADL Overall ADL's : Needs assistance/impaired                         Toilet Transfer: Moderate assistance;Stand-pivot;Cueing for safety;Cueing for sequencing Toilet Transfer Details (indicate cue type and reason): Simulated BSC transfer to recliner         Functional mobility during ADLs: Moderate assistance;Cueing  for safety;Cueing for sequencing General ADL Comments: Pt was able to scoot hips back in recliner needing verbal cues     Vision         Perception         Praxis         Pertinent Vitals/Pain Pain Assessment Pain Assessment: Faces Faces Pain Scale: Hurts a little bit Pain Location: Neck Pain Descriptors / Indicators: Discomfort, Grimacing, Aching Pain Intervention(s): Monitored during session,  Repositioned, Relaxation     Extremity/Trunk Assessment Upper Extremity Assessment Upper Extremity Assessment: Overall WFL for tasks assessed   Lower Extremity Assessment Lower Extremity Assessment: Generalized weakness       Communication Communication Communication: Hearing impairment Following commands: Follows one step commands with increased time Cueing Techniques: Verbal cues;Tactile cues;Visual cues   Cognition Arousal: Alert Behavior During Therapy: WFL for tasks assessed/performed Overall Cognitive Status: Within Functional Limits for tasks assessed                         Following Commands: Follows one step commands with increased time     Problem Solving: Slow processing, Decreased initiation, Requires verbal cues, Requires tactile cues, Difficulty sequencing       General Comments       Exercises Other Exercises Other Exercises: Edu: Importance of exercising upper extremities and lower extremities when seated for prolonged time   Shoulder Instructions      Home Living Family/patient expects to be discharged to:: Assisted living                             Home Equipment: Rolling Walker (2 wheels);Lift chair;Wheelchair Financial trader (4 wheels)   Additional Comments: multiple falls in the last 6 months      Prior Functioning/Environment Prior Level of Function : Needs assist;History of Falls (last six months)       Physical Assist : Mobility (physical);ADLs (physical) Mobility (physical): Transfers;Gait ADLs (physical): Feeding;Bathing;Dressing;Toileting;IADLs Mobility Comments: Pt reported she has been using a WC for stand pivot transfers since attending ALF in begining of September. Family states that prior to September pt was amb with rollator. ADLs Comments: Per family/patient report staff at her ALF assist her in all ADLs.        OT Problem List: Decreased strength;Decreased activity tolerance;Impaired balance  (sitting and/or standing);Decreased coordination;Decreased cognition;Decreased safety awareness;Pain;Cardiopulmonary status limiting activity      OT Treatment/Interventions: Therapeutic exercise;Self-care/ADL training;DME and/or AE instruction;Energy conservation;Therapeutic activities;Patient/family education;Balance training    OT Goals(Current goals can be found in the care plan section) Acute Rehab OT Goals Patient Stated Goal: return to home OT Goal Formulation: With patient/family Time For Goal Achievement: 09/29/23 Potential to Achieve Goals: Fair ADL Goals Pt Will Perform Grooming: sitting;with set-up;with min assist Pt Will Perform Lower Body Dressing: with mod assist;sit to/from stand;sitting/lateral leans;with adaptive equipment Pt Will Transfer to Toilet: bedside commode;ambulating;with mod assist Pt Will Perform Toileting - Clothing Manipulation and hygiene: with mod assist;sitting/lateral leans;sit to/from stand  OT Frequency: Min 1X/week    Co-evaluation              AM-PAC OT "6 Clicks" Daily Activity     Outcome Measure Help from another person eating meals?: A Little Help from another person taking care of personal grooming?: A Little Help from another person toileting, which includes using toliet, bedpan, or urinal?: A Lot Help from another person bathing (including washing, rinsing, drying)?: A Lot  Help from another person to put on and taking off regular upper body clothing?: A Lot Help from another person to put on and taking off regular lower body clothing?: A Lot 6 Click Score: 14   End of Session    Activity Tolerance: Patient tolerated treatment well Patient left: in chair;with call bell/phone within reach;with chair alarm set;with family/visitor present  OT Visit Diagnosis: Unsteadiness on feet (R26.81);History of falling (Z91.81);Muscle weakness (generalized) (M62.81)                Time: 4098-1191 OT Time Calculation (min): 16 min Charges:  OT  General Charges $OT Visit: 1 Visit OT Evaluation $OT Eval Moderate Complexity: 1 Mod  [employer], OTS

## 2023-09-15 NOTE — TOC Initial Note (Addendum)
Transition of Care [facility]) - Initial/Assessment Note    Patient Details  Name: Preeya Koelbl MRN: [id number] Date of Birth: 02/17/1929  [facility]) CM/SW Contact:    Truddie Hidden, RN Phone Number: 09/15/2023, 2:55 PM  Clinical Narrative:                 Patient is from [facility]. Previously at prior to last discharge to  [facility]. Attempt to reach patient's daughter at her request. No answer. Unable to leave a message. MD and unit secretary notified.   Ricky in admissions advised patient may likely return tomorrow per MD.          Patient Goals and CMS Choice            Expected Discharge Plan and Services                                              Prior Living Arrangements/Services                       Activities of Daily Living      Permission Sought/Granted                  Emotional Assessment              Admission diagnosis:  Acute respiratory failure with hypoxia (HCC) [J96.01] Sepsis (HCC) [A41.9] Pneumonia due to infectious organism, unspecified laterality, unspecified part of lung [J18.9] Patient Active Problem List   Diagnosis Date Noted   Atelectasis, bilateral 09/14/2023   Leukocytosis 09/14/2023   History of vertebral compression fracture 09/14/2023   Weakness 09/14/2023   Shortness of breath 09/14/2023   Depression with anxiety 09/14/2023   Acute encephalopathy 09/12/2023   Hypoxia 09/12/2023   Stenosis of left subclavian artery (HCC) 09/09/2023   Acute respiratory failure with hypoxia (HCC) 09/09/2023   Pneumonia 09/09/2023   Right flank pain 09/09/2023   History of recurrent UTI (urinary tract infection) 09/09/2023   Hyponatremia 09/09/2023   Hypochloremia 09/09/2023   Chronic cough 09/09/2023   Chronic tachycardia 09/09/2023   Frailty 09/09/2023   Acute metabolic encephalopathy 09/09/2023   Goals of care, counseling/discussion 08/04/2020   Neck pain  08/04/2020   Abnormal MRI, neck 08/04/2020   History of plasmacytoma 08/04/2020   Orthostatic hypotension 09/13/2016   Dehydration 09/13/2016   Trismus 09/12/2016   Syncope 09/11/2016   Poor appetite 09/11/2016   Atherosclerosis of native coronary artery of native heart without angina pectoris 05/13/2016   Hiatal hernia without gangrene and obstruction 12/01/2015   Herpes zona 11/25/2015   Hx of deep vein thrombophlebitis of lower extremity 11/25/2015   Senile ecchymosis 11/14/2015   Mixed hyperlipidemia 11/14/2015   Acute renal failure superimposed on stage 3a chronic kidney disease (HCC) 11/09/2015   Arthritis, degenerative 11/09/2015   Cervical spinal cord compression (HCC) 11/09/2015   Osteoporosis, post-menopausal 11/09/2015   Female genuine stress incontinence 11/09/2015   Polyneuropathy 09/30/2014   CAD in native artery 06/08/2014   Benign essential HTN 06/08/2014   MI (mitral incompetence) 06/08/2014   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HCC) 05/28/2013   PCP:  Danella Penton, MD Pharmacy:   [facility] [address] - [place], [place] - [address] [place] [address] Phone: [phone] Fax: [phone]  [facility] -  [place], [place] - [address] [facility] [address] Portal to Registered [facility] [address] [place] [address] Phone: [phone] Fax: [phone]  [facility] - [place], [place] - [place] [address] [place] [address] Phone: [phone] Fax: [phone]     Social Determinants of Health (SDOH) Social History: SDOH Screenings   Food Insecurity: No Food Insecurity (09/09/2023)  Housing: Low Risk  (09/09/2023)  Transportation Needs: No Transportation Needs (09/09/2023)  Utilities: Not At Risk (09/09/2023)  Tobacco Use: Low Risk  (09/14/2023)   SDOH Interventions:     Readmission Risk Interventions     No data to display

## 2023-09-15 NOTE — Evaluation (Signed)
Physical Therapy Evaluation Patient Details Name: Amy [name] MRN: [id number] DOB: 1929-10-06 Today's Date: 09/15/2023  History of Present Illness  Amy [name] is a 89 year old female with history of HTN, HLD, DVT, plasmacytoma not on treatment presenting to the emergency department for evaluation of weakness and cough.   Clinical Impression  Patient alert, agreeable to PT, oriented to self and place. Able to provide some PLOF but some conflicting information; pt reported normally able to stand pivot to [facility] without assistance, and able to dress herself, assistance for bathing. Per chart pt has a personal care aide to assist with ADLs, unclear if physical assistance can be provided for transfers.   The patient was up in the chair but agreeable to demonstrate transfers. minA handheld stand pivot to EOB, but required modA to return to recliner. Pt also had complaints of bilateral shoulder/posterior neck pain, respositioned as able for comfort. Pt endorsed some fatigue with activity.  Overall the patient demonstrated deficits (see "PT Problem List") that impede the patient's functional abilities, safety, and mobility and would benefit from skilled PT intervention.          If plan is discharge home, recommend the following: A lot of help with walking and/or transfers;Help with stairs or ramp for entrance;Assist for transportation;Supervision due to cognitive status;Assistance with cooking/housework;A little help with bathing/dressing/bathroom   Can travel by private vehicle   No    Equipment Recommendations Other (comment) (TBD at next venue of care)  Recommendations for Other Services       Functional Status Assessment Patient has had a recent decline in their functional status and demonstrates the ability to make significant improvements in function in a reasonable and predictable amount of time.     Precautions / Restrictions Precautions Precautions: Fall Precaution Comments:  R foot drop Other Brace: soft AFO (brace) for R LE per chart, not in room with pt at evaluation Restrictions Weight Bearing Restrictions: No      Mobility  Bed Mobility               General bed mobility comments: pt seated in recliner at start/end of session    Transfers Overall transfer level: Needs assistance Equipment used: 1 person hand held assist Transfers: Sit to/from Stand Sit to Stand: Min assist, Mod assist           General transfer comment: minA to EOB, but modA to return to the recliner, handheld assist throughout    Ambulation/Gait               General Gait Details: Unable to perform at this time secondary to fatigue  Stairs            Wheelchair Mobility     Tilt Bed    Modified Rankin (Stroke Patients Only)       Balance Overall balance assessment: History of Falls, Needs assistance Sitting-balance support: Feet supported, Single extremity supported, No upper extremity supported Sitting balance-Leahy Scale: Good     Standing balance support: Bilateral upper extremity supported, During functional activity, Reliant on assistive device for balance Standing balance-Leahy Scale: Poor Standing balance comment: reliant on at least unilateral UE support                             Pertinent Vitals/Pain Pain Assessment Pain Assessment: Faces Faces Pain Scale: No hurt    Home Living Family/patient expects to be discharged to:: Assisted living  Home Equipment: Agricultural consultant (2 wheels);Lift chair;Wheelchair Financial trader (4 wheels) Additional Comments: multiple falls in the last 6 months    Prior Function Prior Level of Function : Needs assist;History of Falls (last six months)       Physical Assist : Mobility (physical);ADLs (physical) Mobility (physical): Transfers;Gait ADLs (physical): Feeding;Bathing;Dressing;Toileting;IADLs Mobility Comments: per pt, had been using a WC for stand  pivot, ADLs Comments: pt reported she is able to dress herself, per chart pt has a personal care aide during the day (family reported)     Extremity/Trunk Assessment   Upper Extremity Assessment Upper Extremity Assessment: Generalized weakness    Lower Extremity Assessment Lower Extremity Assessment: Generalized weakness       Communication      Cognition Arousal: Alert Behavior During Therapy: WFL for tasks assessed/performed Overall Cognitive Status: Within Functional Limits for tasks assessed                                 General Comments: oriented to self and place, able to provide some PLOF        General Comments      Exercises     Assessment/Plan    PT Assessment Patient needs continued PT services  PT Problem List Decreased strength;Decreased range of motion;Decreased activity tolerance;Decreased balance;Decreased mobility;Decreased coordination;Decreased safety awareness;Pain       PT Treatment Interventions DME instruction;Gait training;Stair training;Functional mobility training;Therapeutic activities;Therapeutic exercise;Balance training;Neuromuscular re-education    PT Goals (Current goals can be found in the Care Plan section)  Acute Rehab PT Goals Patient Stated Goal: to go back to [facility] PT Goal Formulation: With patient Time For Goal Achievement: 09/29/23 Potential to Achieve Goals: Good    Frequency Min 1X/week     Co-evaluation               AM-PAC PT "6 Clicks" Mobility  Outcome Measure Help needed turning from your back to your side while in a flat bed without using bedrails?: A Lot Help needed moving from lying on your back to sitting on the side of a flat bed without using bedrails?: A Lot Help needed moving to and from a bed to a chair (including a wheelchair)?: A Lot Help needed standing up from a chair using your arms (e.g., wheelchair or bedside chair)?: A Lot Help needed to walk in hospital room?: A  Lot Help needed climbing 3-5 steps with a railing? : Total 6 Click Score: 11    End of Session Equipment Utilized During Treatment: Gait belt Activity Tolerance: Patient tolerated treatment well Patient left: in chair;with call bell/phone within reach;with chair alarm set Nurse Communication: Mobility status PT Visit Diagnosis: Unsteadiness on feet (R26.81);Repeated falls (R29.6);Muscle weakness (generalized) (M62.81);History of falling (Z91.81);Pain Pain - Right/Left:  (bilateral) Pain - part of body:  (back)    Time: 9563-8756 PT Time Calculation (min) (ACUTE ONLY): 10 min   Charges:   PT Evaluation $PT Eval Moderate Complexity: 1 Mod   PT General Charges $$ ACUTE PT VISIT: 1 Visit         {Carlitos Bottino Orvan Falconer PT, DPT 11:17 AM,09/15/23

## 2023-09-15 NOTE — Progress Notes (Signed)
PROGRESS NOTE    Amy [name]  XBJ:[id number] DOB: November 14, 1929 DOA: 09/14/2023 PCP: Danella Penton, MD     Brief Narrative:    Amy [name] is a 89 year old female with history of GERD, hypertension, depression, anxiety, CAD, who presents to the emergency department for chief concerns of worsening shortness of breath and cough.  At bedside, patient patient was able to tell me her name, age, location, current calendar year.   Patient is eating dinner and she states that the piece taste good.  She states she feels better here than she did at the facility.   Patient reports that she was short of breath and had increased cough.  She denies trauma to her person.  She reports that her bilateral shoulder joints hurt her and this happens sometimes.   Granddaughter at bedside states that she was endorsing some thoracic discomfort.  Patient states that this comfort is surrounding the area where she had the compression fracture with a cement fix.   Patient states she forgot all about that.     Patient is requesting to not go back to that facility.  She endorses generalized weakness.  She also endorses swelling of her lower extremity that is her baseline.  She reports that the swelling and the pain gets worse when she lays in bed all day.     She reports that the facility was not nice to her and did not take good care of her.  They refused to turn off the TV stating that he was not their job. She reports that the facility refused to let her get out of bed and states that she has to stay in bed all day.   Assessment & Plan:   Principal Problem:   Sepsis (HCC) Active Problems:   Atelectasis, bilateral   CAD in native artery   Benign essential HTN   Mixed hyperlipidemia   Hx of deep vein thrombophlebitis of lower extremity   Leukocytosis   History of vertebral compression fracture   Weakness   Shortness of breath   Depression with anxiety  # Cough Likely sequelae of recent  pneumonia. No o2 requirement today. CTA no signs of PE. Does have bibasilar atelectasis that is likely contributing. No focal infiltrate or pulm edema to suggest overt chf but bnp is mildly elevated. Troponins are negative. Wbc elevated but was on steroids at home. Covid negative. Rvp last week negative - f/u tte - incentive spirometer  # T11/12 compression fractures S/p kyphoplasty in 2019, patient's main complaint is cervical/thoracic neck pain - will check mri - lidocaine patch, duloxetine  # HTN BP controlled - cont home dilt  # CAD No chest pain, normal trops - cont home asa, metop  # Dementia - cont home seroquel    DVT prophylaxis: lovenox Code Status: dnr Family Communication: daughter updated telephonically  Level of care: Progressive Status is: Inpatient Remains inpatient appropriate because: need for inpatient w/u    Consultants:  none  Procedures: none  Antimicrobials:  none    Subjective: Reports neck/shoulder pain  Objective: Vitals:   09/14/23 2036 09/14/23 2334 09/15/23 0846 09/15/23 1204  BP: (!) 164/73 (!) 166/71 (!) 169/69 (!) 157/82  Pulse: 70 75 72 84  Resp: 16 18 17  (!) 21  Temp: 98.2 F (36.8 C) 97.7 F (36.5 C) 99.2 F (37.3 C) 98.4 F (36.9 C)  TempSrc: Oral  Oral Oral  SpO2: 94% 95% 93% 93%    Intake/Output Summary (Last 24 hours) at  09/15/2023 1224 Last data filed at 09/15/2023 0354 Gross per 24 hour  Intake 1090 ml  Output 250 ml  Net 840 ml   There were no vitals filed for this visit.  Examination:  General exam: Appears in mild pain Respiratory system: rales at bases, scattered rhonchi Cardiovascular system: S1 & S2 heard, RRR. No JVD, murmurs, rubs, gallops or clicks. No pedal edema. Gastrointestinal system: Abdomen is nondistended, soft and nontender. No organomegaly or masses felt. Normal bowel sounds heard. Central nervous system: Alert, moving all 4 Extremities: Symmetric 5 x 5 power. Skin: No rashes,  lesions or ulcers Psychiatry: calm    Data Reviewed: I have personally reviewed following labs and imaging studies  CBC: Recent Labs  Lab 09/08/23 1915 09/09/23 0219 09/10/23 0611 09/11/23 0348 09/14/23 1133 09/15/23 0349  WBC 14.9* 13.0* 9.1 8.3 21.6* 14.1*  NEUTROABS 12.3*  --   --   --  18.2*  --   HGB 13.8 11.3* 11.0* 11.4* 12.6 11.4*  HCT 45.9 34.4* 34.6* 35.4* 40.8 35.7*  MCV 95.4 88.4 89.2 87.0 91.9 88.8  PLT 250 215 247 277 396 358   Basic Metabolic Panel: Recent Labs  Lab 09/09/23 0219 09/10/23 0611 09/11/23 0348 09/14/23 1133 09/15/23 0349  NA 133* 135 138 135 139  K 3.6 3.5 3.6 4.3 4.4  CL 93* 97* 97* 100 106  CO2 29 28 27 25 25   GLUCOSE 155* 102* 124* 106* 89  BUN 29* 30* 24* 19 16  CREATININE 1.10* 0.75 0.60 0.69 0.55  CALCIUM 8.5* 8.6* 8.9 8.9 8.7*  MG  --  2.0 2.1  --   --    GFR: Estimated Creatinine Clearance: 40.4 mL/min (by C-G formula based on SCr of 0.55 mg/dL). Liver Function Tests: Recent Labs  Lab 09/08/23 1915 09/14/23 1133  AST 16 40  ALT 19 37  ALKPHOS 139* 101  BILITOT 1.1 0.6  PROT 7.1 6.4*  ALBUMIN 3.0* 2.8*   No results for input(s): "LIPASE", "AMYLASE" in the last 168 hours. No results for input(s): "AMMONIA" in the last 168 hours. Coagulation Profile: Recent Labs  Lab 09/08/23 2034  INR 1.2   Cardiac Enzymes: No results for input(s): "CKTOTAL", "CKMB", "CKMBINDEX", "TROPONINI" in the last 168 hours. BNP (last 3 results) No results for input(s): "PROBNP" in the last 8760 hours. HbA1C: No results for input(s): "HGBA1C" in the last 72 hours. CBG: Recent Labs  Lab 09/09/23 2132  GLUCAP 127*   Lipid Profile: No results for input(s): "CHOL", "HDL", "LDLCALC", "TRIG", "CHOLHDL", "LDLDIRECT" in the last 72 hours. Thyroid Function Tests: No results for input(s): "TSH", "T4TOTAL", "FREET4", "T3FREE", "THYROIDAB" in the last 72 hours. Anemia Panel: No results for input(s): "VITAMINB12", "FOLATE", "FERRITIN", "TIBC",  "IRON", "RETICCTPCT" in the last 72 hours. Urine analysis:    Component Value Date/Time   COLORURINE STRAW (A) 09/14/2023 1758   APPEARANCEUR HAZY (A) 09/14/2023 1758   LABSPEC 1.015 09/14/2023 1758   PHURINE 7.0 09/14/2023 1758   GLUCOSEU NEGATIVE 09/14/2023 1758   HGBUR NEGATIVE 09/14/2023 1758   BILIRUBINUR NEGATIVE 09/14/2023 1758   BILIRUBINUR neg 05/13/2016 1055   KETONESUR NEGATIVE 09/14/2023 1758   PROTEINUR NEGATIVE 09/14/2023 1758   UROBILINOGEN 0.2 05/13/2016 1055   NITRITE NEGATIVE 09/14/2023 1758   LEUKOCYTESUR LARGE (A) 09/14/2023 1758   Sepsis Labs: @LABRCNTIP (procalcitonin:4,lacticidven:4)  ) Recent Results (from the past 240 hour(s))  Culture, blood (Routine x 2)     Status: None   Collection Time: 09/08/23  8:34 PM   Specimen:  BLOOD  Result Value Ref Range Status   Specimen Description BLOOD BLOOD RIGHT HAND  Final   Special Requests   Final    BOTTLES DRAWN AEROBIC AND ANAEROBIC Blood Culture adequate volume   Culture   Final    NO GROWTH 5 DAYS Performed at [facility], [address] [place], [place] [address]    Report Status 09/13/2023 FINAL  Final  Culture, blood (Routine x 2)     Status: None   Collection Time: 09/08/23  8:34 PM   Specimen: BLOOD  Result Value Ref Range Status   Specimen Description BLOOD BLOOD RIGHT ARM  Final   Special Requests   Final    BOTTLES DRAWN AEROBIC AND ANAEROBIC Blood Culture results may not be optimal due to an excessive volume of blood received in culture bottles   Culture   Final    NO GROWTH 5 DAYS Performed at [facility], [address], [place], [place] [address]    Report Status 09/13/2023 FINAL  Final  Resp panel by RT-PCR (RSV, Flu A&B, Covid) Anterior Nasal Swab     Status: None   Collection Time: 09/08/23  9:37 PM   Specimen: Anterior Nasal Swab  Result Value Ref Range Status   SARS Coronavirus 2 by RT PCR NEGATIVE NEGATIVE Final    Comment: (NOTE) SARS-CoV-2 target nucleic  acids are NOT DETECTED.  The SARS-CoV-2 RNA is generally detectable in upper respiratory specimens during the acute phase of infection. The lowest concentration of SARS-CoV-2 viral copies this assay can detect is 138 copies/mL. A negative result does not preclude SARS-Cov-2 infection and should not be used as the sole basis for treatment or other patient management decisions. A negative result may occur with  improper specimen collection/handling, submission of specimen other than nasopharyngeal swab, presence of viral mutation(s) within the areas targeted by this assay, and inadequate number of viral copies(<138 copies/mL). A negative result must be combined with clinical observations, patient history, and epidemiological information. The expected result is Negative.  Fact Sheet for Patients:  BloggerCourse.com  Fact Sheet for Healthcare Providers:  SeriousBroker.it  This test is no t yet approved or cleared by the [place] FDA and  has been authorized for detection and/or diagnosis of SARS-CoV-2 by FDA under an Emergency Use Authorization (EUA). This EUA will remain  in effect (meaning this test can be used) for the duration of the COVID-19 declaration under Section 564(b)(1) of the Act, 21 U.S.C.section 360bbb-3(b)(1), unless the authorization is terminated  or revoked sooner.       Influenza A by PCR NEGATIVE NEGATIVE Final   Influenza B by PCR NEGATIVE NEGATIVE Final    Comment: (NOTE) The Xpert Xpress SARS-CoV-2/FLU/RSV plus assay is intended as an aid in the diagnosis of influenza from Nasopharyngeal swab specimens and should not be used as a sole basis for treatment. Nasal washings and aspirates are unacceptable for Xpert Xpress SARS-CoV-2/FLU/RSV testing.  Fact Sheet for Patients: BloggerCourse.com  Fact Sheet for Healthcare Providers: SeriousBroker.it  This  test is not yet approved or cleared by the [place] FDA and has been authorized for detection and/or diagnosis of SARS-CoV-2 by FDA under an Emergency Use Authorization (EUA). This EUA will remain in effect (meaning this test can be used) for the duration of the COVID-19 declaration under Section 564(b)(1) of the Act, 21 U.S.C. section 360bbb-3(b)(1), unless the authorization is terminated or revoked.     Resp Syncytial Virus by PCR NEGATIVE NEGATIVE Final  Comment: (NOTE) Fact Sheet for Patients: BloggerCourse.com  Fact Sheet for Healthcare Providers: SeriousBroker.it  This test is not yet approved or cleared by the [place] FDA and has been authorized for detection and/or diagnosis of SARS-CoV-2 by FDA under an Emergency Use Authorization (EUA). This EUA will remain in effect (meaning this test can be used) for the duration of the COVID-19 declaration under Section 564(b)(1) of the Act, 21 U.S.C. section 360bbb-3(b)(1), unless the authorization is terminated or revoked.  Performed at [facility], [address], [place], [place] [address]   Urine Culture     Status: None   Collection Time: 09/09/23  5:27 AM   Specimen: Urine, Random  Result Value Ref Range Status   Specimen Description   Final    URINE, RANDOM Performed at [facility], [address] [place], [place] [address]    Special Requests   Final    NONE Reflexed from [id number] Performed at [facility], [address] [place], [place] [address]    Culture   Final    NO GROWTH Performed at [facility], [address] [place], [place] [address]    Report Status 09/10/2023 FINAL  Final  MRSA Next Gen by PCR, Nasal     Status: None   Collection Time: 09/09/23  1:51 PM   Specimen: Nasal Mucosa; Nasal Swab  Result Value Ref Range Status   MRSA by PCR Next Gen NOT DETECTED NOT DETECTED Final    Comment: (NOTE) The  GeneXpert MRSA Assay (FDA approved for NASAL specimens only), is one component of a comprehensive MRSA colonization surveillance program. It is not intended to diagnose MRSA infection nor to guide or monitor treatment for MRSA infections. Test performance is not FDA approved in patients less than 25 years old. Performed at [facility], [address] [place], [place] [address]   Respiratory (~20 pathogens) panel by PCR     Status: None   Collection Time: 09/09/23  1:55 PM  Result Value Ref Range Status   Adenovirus NOT DETECTED NOT DETECTED Final   Coronavirus 229E NOT DETECTED NOT DETECTED Final    Comment: (NOTE) The Coronavirus on the Respiratory Panel, DOES NOT test for the novel  Coronavirus (2019 nCoV)    Coronavirus HKU1 NOT DETECTED NOT DETECTED Final   Coronavirus NL63 NOT DETECTED NOT DETECTED Final   Coronavirus OC43 NOT DETECTED NOT DETECTED Final   Metapneumovirus NOT DETECTED NOT DETECTED Final   Rhinovirus / Enterovirus NOT DETECTED NOT DETECTED Final   Influenza A NOT DETECTED NOT DETECTED Final   Influenza B NOT DETECTED NOT DETECTED Final   Parainfluenza Virus 1 NOT DETECTED NOT DETECTED Final   Parainfluenza Virus 2 NOT DETECTED NOT DETECTED Final   Parainfluenza Virus 3 NOT DETECTED NOT DETECTED Final   Parainfluenza Virus 4 NOT DETECTED NOT DETECTED Final   Respiratory Syncytial Virus NOT DETECTED NOT DETECTED Final   Bordetella pertussis NOT DETECTED NOT DETECTED Final   Bordetella Parapertussis NOT DETECTED NOT DETECTED Final   Chlamydophila pneumoniae NOT DETECTED NOT DETECTED Final   Mycoplasma pneumoniae NOT DETECTED NOT DETECTED Final    Comment: Performed at [facility], [address], [place], [place] [address]  Resp panel by RT-PCR (RSV, Flu A&B, Covid) Anterior Nasal Swab     Status: None   Collection Time: 09/14/23 11:34 AM   Specimen: Anterior Nasal Swab  Result Value Ref Range Status   SARS Coronavirus 2 by RT  PCR NEGATIVE  NEGATIVE Final    Comment: (NOTE) SARS-CoV-2 target nucleic acids are NOT DETECTED.  The SARS-CoV-2 RNA is generally detectable in upper respiratory specimens during the acute phase of infection. The lowest concentration of SARS-CoV-2 viral copies this assay can detect is 138 copies/mL. A negative result does not preclude SARS-Cov-2 infection and should not be used as the sole basis for treatment or other patient management decisions. A negative result may occur with  improper specimen collection/handling, submission of specimen other than nasopharyngeal swab, presence of viral mutation(s) within the areas targeted by this assay, and inadequate number of viral copies(<138 copies/mL). A negative result must be combined with clinical observations, patient history, and epidemiological information. The expected result is Negative.  Fact Sheet for Patients:  BloggerCourse.com  Fact Sheet for Healthcare Providers:  SeriousBroker.it  This test is no t yet approved or cleared by the [place] FDA and  has been authorized for detection and/or diagnosis of SARS-CoV-2 by FDA under an Emergency Use Authorization (EUA). This EUA will remain  in effect (meaning this test can be used) for the duration of the COVID-19 declaration under Section 564(b)(1) of the Act, 21 U.S.C.section 360bbb-3(b)(1), unless the authorization is terminated  or revoked sooner.       Influenza A by PCR NEGATIVE NEGATIVE Final   Influenza B by PCR NEGATIVE NEGATIVE Final    Comment: (NOTE) The Xpert Xpress SARS-CoV-2/FLU/RSV plus assay is intended as an aid in the diagnosis of influenza from Nasopharyngeal swab specimens and should not be used as a sole basis for treatment. Nasal washings and aspirates are unacceptable for Xpert Xpress SARS-CoV-2/FLU/RSV testing.  Fact Sheet for Patients: BloggerCourse.com  Fact Sheet for Healthcare  Providers: SeriousBroker.it  This test is not yet approved or cleared by the [place] FDA and has been authorized for detection and/or diagnosis of SARS-CoV-2 by FDA under an Emergency Use Authorization (EUA). This EUA will remain in effect (meaning this test can be used) for the duration of the COVID-19 declaration under Section 564(b)(1) of the Act, 21 U.S.C. section 360bbb-3(b)(1), unless the authorization is terminated or revoked.     Resp Syncytial Virus by PCR NEGATIVE NEGATIVE Final    Comment: (NOTE) Fact Sheet for Patients: BloggerCourse.com  Fact Sheet for Healthcare Providers: SeriousBroker.it  This test is not yet approved or cleared by the [place] FDA and has been authorized for detection and/or diagnosis of SARS-CoV-2 by FDA under an Emergency Use Authorization (EUA). This EUA will remain in effect (meaning this test can be used) for the duration of the COVID-19 declaration under Section 564(b)(1) of the Act, 21 U.S.C. section 360bbb-3(b)(1), unless the authorization is terminated or revoked.  Performed at [facility], [address] [place], [place] [address]   Culture, blood (routine x 2)     Status: None (Preliminary result)   Collection Time: 09/14/23 12:08 PM   Specimen: BLOOD  Result Value Ref Range Status   Specimen Description BLOOD LEFT ANTECUBITAL  Final   Special Requests   Final    BOTTLES DRAWN AEROBIC AND ANAEROBIC Blood Culture results may not be optimal due to an inadequate volume of blood received in culture bottles   Culture   Final    NO GROWTH < 24 HOURS Performed at [facility], [address] [place], [place] [address]    Report Status PENDING  Incomplete  Culture, blood (routine x 2)     Status: None (Preliminary result)   Collection Time:  09/14/23 12:13 PM   Specimen: BLOOD  Result Value Ref Range Status   Specimen  Description BLOOD BLOOD LEFT WRIST  Final   Special Requests   Final    BOTTLES DRAWN AEROBIC AND ANAEROBIC Blood Culture results may not be optimal due to an inadequate volume of blood received in culture bottles   Culture   Final    NO GROWTH < 24 HOURS Performed at [facility], [address], [place], [place] [address]    Report Status PENDING  Incomplete         Radiology Studies: CT Angio Chest PE W/Cm &/Or Wo Cm  Result Date: 09/14/2023 CLINICAL DATA:  Shortness of breath and worsening cough 2 days. Possible pulmonary embolism. EXAM: CT ANGIOGRAPHY CHEST WITH CONTRAST TECHNIQUE: Multidetector CT imaging of the chest was performed using the standard protocol during bolus administration of intravenous contrast. Multiplanar CT image reconstructions and MIPs were obtained to evaluate the vascular anatomy. RADIATION DOSE REDUCTION: This exam was performed according to the departmental dose-optimization program which includes automated exposure control, adjustment of the mA and/or kV according to patient size and/or use of iterative reconstruction technique. CONTRAST:  60mL OMNIPAQUE IOHEXOL 350 MG/ML SOLN COMPARISON:  09/10/2023, 09/08/2023 FINDINGS: Cardiovascular: Mild stable cardiomegaly. Moderate calcified plaque over the left main and 3 vessel coronary arteries. Thoracic aorta is normal caliber. There is calcified plaque over the descending thoracic aorta. Pulmonary arterial system is well opacified without evidence of emboli. Stable prominence of the right main pulmonary artery. Remaining vascular structures are unremarkable. Mediastinum/Nodes: 1.4 cm subcarinal lymph node without significant change. No other significant mediastinal or hilar adenopathy. Moderate size hiatal hernia. Lungs/Pleura: Lungs are adequately inflated. Mild stable biapical scarring. Continued evidence of small bilateral pleural effusions with associated bibasilar atelectasis. Slightly better aeration in the  left lung base. Airways are unremarkable. Upper Abdomen: Calcified plaque over the abdominal aorta. Nonobstructing 3 mm stone over the upper pole right kidney. Stable prominence of the common bile duct and central intrahepatic ducts. Musculoskeletal: 2 adjacent lower thoracic spine compression fractures post kyphoplasty unchanged. No acute findings. Review of the MIP images confirms the above findings. IMPRESSION: 1. No evidence of pulmonary embolism. 2. Continued evidence of small bilateral pleural effusions with associated bibasilar atelectasis. Slightly better aeration in the left lung base. 3. Stable 1.4 cm subcarinal lymph node likely reactive. 4. Moderate size hiatal hernia. 5. Aortic atherosclerosis. Atherosclerotic coronary artery disease. 6. Nonobstructing right renal stone. 7. Stable prominence of the common bile duct and central intrahepatic ducts. Aortic Atherosclerosis (ICD10-I70.0). Electronically Signed   By: Elberta Fortis M.D.   On: 09/14/2023 15:32   DG Chest 2 View  Result Date: 09/14/2023 CLINICAL DATA:  Chest pain, shortness of breath, and cough. Hypoxia. EXAM: CHEST - 2 VIEW COMPARISON:  09/08/2023 FINDINGS: Stable mild cardiomegaly. Stable small bilateral pleural effusions. Decreased atelectasis seen in the left lung base since prior exam. Mild right basilar atelectasis shows no significant change. IMPRESSION: Decreased left basilar atelectasis. Stable mild right basilar atelectasis and small bilateral pleural effusions. Electronically Signed   By: Danae Orleans M.D.   On: 09/14/2023 12:28        Scheduled Meds:  aspirin EC  81 mg Oral Daily   diltiazem  120 mg Oral BID   DULoxetine  20 mg Oral Daily   enoxaparin (LOVENOX) injection  40 mg Subcutaneous Q24H   lidocaine  1 patch Transdermal Q24H   melatonin  2.5 mg Oral QHS   multivitamin with  minerals  1 tablet Oral Daily   Continuous Infusions:   LOS: 1 day     Silvano Bilis, MD [facility]   If 7PM-7AM, please  contact night-coverage [email or web address] Password [facility] 09/15/2023, 12:24 PM

## 2023-09-16 DIAGNOSIS — R0602 Shortness of breath: Secondary | ICD-10-CM | POA: Diagnosis not present

## 2023-09-16 LAB — BASIC METABOLIC PANEL
Anion gap: 10 (ref 5–15)
BUN: 15 mg/dL (ref 8–23)
CO2: 23 mmol/L (ref 22–32)
Calcium: 8.6 mg/dL — ABNORMAL LOW (ref 8.9–10.3)
Chloride: 102 mmol/L (ref 98–111)
Creatinine, Ser: 0.6 mg/dL (ref 0.44–1.00)
GFR, Estimated: >60 mL/min (ref >60)
Glucose, Bld: 130 mg/dL — ABNORMAL HIGH (ref 70–99)
Potassium: 4.1 mmol/L (ref 3.5–5.1)
Sodium: 135 mmol/L (ref 135–145)

## 2023-09-16 LAB — ECHOCARDIOGRAM COMPLETE
Area-P 1/2: 3.05 cm2
P 1/2 time: 355 msec
S' Lateral: 2.9 cm

## 2023-09-16 MED ORDER — PANTOPRAZOLE SODIUM 40 MG PO TBEC
40.0000 mg | DELAYED_RELEASE_TABLET | Freq: Every day | ORAL | Status: DC
  Administered 2023-09-16 – 2023-09-22 (×7): 40 mg via ORAL
  Filled 2023-09-16 (×7): qty 1

## 2023-09-16 MED ORDER — CALCIUM CARBONATE ANTACID 500 MG PO CHEW
1.0000 | CHEWABLE_TABLET | Freq: Three times a day (TID) | ORAL | Status: DC | PRN
  Administered 2023-09-20 – 2023-09-21 (×2): 200 mg via ORAL
  Filled 2023-09-16 (×2): qty 1

## 2023-09-16 MED ORDER — SODIUM CHLORIDE 0.9 % IV SOLN
3.0000 g | Freq: Three times a day (TID) | INTRAVENOUS | Status: AC
  Administered 2023-09-16 – 2023-09-20 (×14): 3 g via INTRAVENOUS
  Filled 2023-09-16 (×14): qty 8

## 2023-09-16 MED ORDER — SODIUM CHLORIDE 0.9 % IV BOLUS
500.0000 mL | Freq: Once | INTRAVENOUS | Status: AC
  Administered 2023-09-16: 500 mL via INTRAVENOUS

## 2023-09-16 NOTE — Progress Notes (Signed)
Patient had a blood pressure of 80/46 MAP 57 at 0907; Dr Ashok Pall notified and ordered a 500 ml bolus of normal saline over 2 hours; bolus given as ordered; blood pressure after bolus is 112/56 (72); will continue to monitor and assist as needed.

## 2023-09-16 NOTE — Plan of Care (Signed)

## 2023-09-16 NOTE — Consult Note (Signed)
Pharmacy Antibiotic Note  Amy [name] is a 89 y.o. female admitted on 09/14/2023 with  aspiration pneumonia .  Pharmacy has been consulted for Unasyn dosing.  Plan: Unasyn 3gm IV q 8hrs Will follow and adjust based as needed  Weight: 65.6 kg (144 lb 10 oz)  Temp (24hrs), Avg:98.3 F (36.8 C), Min:98 F (36.7 C), Max:98.6 F (37 C)  Recent Labs  Lab 09/10/23 0611 09/11/23 0348 09/14/23 1133 09/14/23 1215 09/15/23 0349 09/16/23 0937  WBC 9.1 8.3 21.6*  --  14.1*  --   CREATININE 0.75 0.60 0.69  --  0.55 0.60  LATICACIDVEN  --   --   --  1.5  --   --     Estimated Creatinine Clearance: 40.4 mL/min (by C-G formula based on SCr of 0.6 mg/dL).    Allergies  Allergen Reactions   Doxycycline Hyclate Nausea Only   Levofloxacin Nausea And Vomiting   Sulfa Antibiotics Rash    Other Reaction: Throat feels funny    Antimicrobials this admission: 9/22 azithromycin x 1 9/22 Cefepime x 1 9/22 Vancomycin x 1 9/24 Unasyn >>   Dose adjustments this admission: n/a  Microbiology results:  Thank you for allowing pharmacy to be a part of this patient's care.  Tyrese Capriotti Rodriguez-Guzman PharmD, BCPS 09/16/2023 12:32 PM

## 2023-09-16 NOTE — TOC Progression Note (Signed)
Transition of Care [facility]) - Progression Note    Patient Details  Name: Amy [name] MRN: [id number] Date of Birth: [birth year]  [facility] CM/SW Contact  Darolyn Rua, [place] Phone Number: 09/16/2023, 2:16 PM  Clinical Narrative:     Per Ancil Boozer Request CSW has faxed all requested clinicals to [phone]       Expected Discharge Plan and Services                                               Social Determinants of Health (SDOH) Interventions SDOH Screenings   Food Insecurity: No Food Insecurity (09/09/2023)  Housing: Low Risk  (09/09/2023)  Transportation Needs: No Transportation Needs (09/09/2023)  Utilities: Not At Risk (09/09/2023)  Tobacco Use: Low Risk  (09/14/2023)    Readmission Risk Interventions     No data to display

## 2023-09-16 NOTE — TOC Progression Note (Addendum)
Transition of Care [facility]) - Progression Note    Patient Details  Name: Amy [name] MRN: [id number] Date of Birth: 01-03-1929  Transition of Care [facility]) CM/SW Contact  Darolyn Rua, [place] Phone Number: 09/16/2023, 10:22 AM  Clinical Narrative:     Per PT if [facility] is able to provide 1 person physical assistance for all mobility, may return home with HHPT  CSW reached out to [facility] at [phone] spoke with Fulton Mole who reports Velna Hatchet will call CSW back to schedule their assessment for patient to see if she will be able to return, MD updated.   CSW met with patient's daughter Dois Davenport at bedside to inform of above, she reports that she is hopeful for return to [facility] at [employer] and requests update on when Velna Hatchet is to complete assessment as she wishes to be present.        Expected Discharge Plan and Services                                               Social Determinants of Health (SDOH) Interventions SDOH Screenings   Food Insecurity: No Food Insecurity (09/09/2023)  Housing: Low Risk  (09/09/2023)  Transportation Needs: No Transportation Needs (09/09/2023)  Utilities: Not At Risk (09/09/2023)  Tobacco Use: Low Risk  (09/14/2023)    Readmission Risk Interventions     No data to display

## 2023-09-16 NOTE — Plan of Care (Signed)
Problem: Fluid Volume: Goal: Hemodynamic stability will improve Outcome: Progressing   Problem: Clinical Measurements: Goal: Diagnostic test results will improve Outcome: Progressing

## 2023-09-16 NOTE — Evaluation (Signed)
Clinical/Bedside Swallow Evaluation Patient Details  Name: Amy [name] MRN: [id number] Date of Birth: [birth year]  Today's Date: 09/16/2023 Time: SLP Start Time (ACUTE ONLY): 1340 SLP Stop Time (ACUTE ONLY): 1430 SLP Time Calculation (min) (ACUTE ONLY): 50 min  Past Medical History:  Past Medical History:  Diagnosis Date   Arthritis    Depression    Diverticulitis    DVT (deep venous thrombosis) (HCC)    Right leg    Dyspnea    Family history of adverse reaction to anesthesia    PONV sister   GERD (gastroesophageal reflux disease)    History of hiatal hernia    Hyperlipidemia    Hypertension    Mitral valve regurgitation    Neuromuscular disorder (HCC)    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HCC)    Pneumonia    Renal disorder    Past Surgical History:  Past Surgical History:  Procedure Laterality Date   ABDOMINAL HYSTERECTOMY     partial   ABDOMINAL SURGERY     bladder tack  1990   BREAST BIOPSY  1974   CATARACT EXTRACTION, BILATERAL     CERVICAL SPINE SURGERY     tumor removed   HERNIA REPAIR     KYPHOPLASTY N/A 03/12/2018   Procedure: [id number];  Surgeon: Kennedy Bucker, MD;  Location: [facility] ORS;  Service: Orthopedics;  Laterality: N/A;   KYPHOPLASTY N/A 06/30/2018   Procedure: Shauna Hugh;  Surgeon: Kennedy Bucker, MD;  Location: [facility] ORS;  Service: Orthopedics;  Laterality: N/A;   plasmacytoma resection  2005   lumbar   HPI:  Pt is a 89 y.o. female with medical history significant for HTN, Falls, Vision deficits baseline, Nocturnal tachycardia on diltiazem, plasmacytoma not on treatment and history of recurrent UTIs frequent falls, who was brought from NH for evaluation of weakness cough and increasing confusion. This was the same admitting dx as her recent admit on 09/08/23. She endorses generalized weakness.  She also endorses swelling of her lower extremity that is her baseline.  She reports that the swelling and the pain gets worse when she lays in bed all day.  Daughter  stated pt has not been adjusting well to the move to [facility]; doesn't feel she is well-taken care of there.   Patient who has foot drop and uses a brace and a walker had not been as ambulant as she used to be over the past several weeks and also had a chronic cough and been sitting up in the recliner to sleep.  Daughter states that she has overall been declining in recent weeks requiring move to [place] [place](prior to that, pt had been staying w/ the 3 dtrs on different days).   CT Of Chest: evidence of small bilateral pleural effusions with  associated bibasilar atelectasis. Slightly better aeration in the  left lung base.  Stable 1.4 cm subcarinal lymph node likely reactive.  Moderate size hiatal hernia.  Aortic atherosclerosis. Atherosclerotic coronary artery disease.  Large Hiatal Hernia per Imaging baseline.    Assessment / Plan / Recommendation  Clinical Impression   Pt seen for BSE at Lunch meal today. Family friend present assisting pt. Pt awake, verbal. Followed instructions w/ cues. Pt has suspected mild Cognitive decline, per Daugher's agreement last admit(recent admit) and MD report. Pt also has a Large Hiatal Hernia per Imaging last admit. Pt has Baseline Vision deficits impacting her self-feeding; overall weakness and Advanced Age. On [place] O2 1L; afebrile. WBC trending down.   Pt appears to present w/ functional oropharyngeal  phase swallowing w/ only slightly, min increased oral phase time w/ increased textured foods -- min lengthy mastication w/ certain textures(tougher ones) suspect d/t Loose Fitting Dentures(U/L present). No overt clinical s/s of aspiration during the po trials. Pt also has a Large Hiatal Hernia dx'd(per Imaging) w/ suspected slower Esophageal phase motility and impact from such on overall oral intake, as well as her Advanced Age of 89yo.   Pt appears at reduced risk for aspiration when following general aspiration and REFLUX precautions and supported w/ a more  soft, chopped foods(Meats Minced) in her diet for ease of chewing/intake overall. Supervision at meals for support d/t Baseline Vision deficits and follow through w/ precautions.  Pt appears to have challenging factors that could impact her oropharyngeal swallowing to include fatigue/weakness, Mild Cognitive decline, Vision deficits impacting self-feeding, Loose Fitting Dentures impacting mastication efficiency, and Advanced Age as well.  ALSO: pt has a Dx'd Large Hiatal Hernia -- ANY Esophageal phase Dysmotility can increase risk for Retrograde flow and aspiration of REFLUX material especially if lying down post meals. These factors can increase risk for aspiration, dysphagia as well as decreased oral intake overall.    During po trials, pt consumed Lunch meal items of moistened soft solids, MINCED meats w/ gravy, purees, and thin liquids VIA CUP and STRAW w/ No overt coughing, decline in vocal quality, or change in respiratory presentation during/post trials. No decline in O2 sats. Rest breaks and alternating foods/liquids were given intermittently b/t trials to support Esophageal clearing. Oral phase appeared [facility] bolus management, functional for mastication of softened, MINCED meats and solids - overall functional w/ oral clearing achieved w/ all trials.  Pt held Cup to feed self w/ setup support d/t vision deficits.    Recommend continue a more Mech Soft diet w/ Meats Minced w/ well-moistened foods(Dtr had stated she was "cutting up her foods real small" at home prior to moving to Facility); Thin liquids -- pt should help to Hold Cup when drinking. Monitor straw use. Recommend general aspiration precautions, tray setup and sitting support. Reduce distractions at meals. Rest Breaks for conservation of energy and Esophageal clearing. Support w/ feeding as needed d/t Vision deficits and overall weakness.  REFLUX PRECAUTIONS d/t Large Hiatal Hernia. Pills CRUSHED vs Whole in Puree for safer, easier  swallowing.    Education given on Pills in Puree; food consistencies and easy to eat options; general aspiration precautions to pt; posted in room/chart. Handout given on REFLUX precautions also.  No further ST services indicated currently as pt appears at her Baseline. MD/NSG updated, agreed.  Recommend Dietician f/u for support. Recommend Palliative Care consult for GOC discussion per Daughter's description of overall decline in functioning at advanced age. SLP Visit Diagnosis: Dysphagia, unspecified (R13.10) (Esophageal phase dysmotility suspected; Large Hiatal Hernia per chart; Cognitive decline at baseline)    Aspiration Risk  Mild aspiration risk;Risk for inadequate nutrition/hydration (reduced following general precautions and given support at meals)    Diet Recommendation   Thin;Dysphagia 3 (mechanical soft) (meats minced; moistened foods) = a more Mech Soft diet w/ Meats Minced w/ well-moistened foods(Dtr had stated she was "cutting up her foods real small" at home prior to moving to Facility); Thin liquids -- pt should help to Hold Cup when drinking. Monitor straw use. Recommend general aspiration precautions, tray setup and sitting support. Reduce distractions at meals. Rest Breaks for conservation of energy and Esophageal clearing. Support w/ feeding as needed d/t Vision deficits and overall weakness.  REFLUX PRECAUTIONS d/t Large  Hiatal Hernia.   Medication Administration: Crushed with puree (vs Whole in Puree if able to tolerate)    Other  Recommendations Recommended Consults: Consider GI evaluation;Consider esophageal assessment (Palliative Care for GOC; Dietician support) Oral Care Recommendations: Oral care BID;Oral care before and after PO;Staff/trained caregiver to provide oral care (denture care)    Recommendations for follow up therapy are one component of a multi-disciplinary discharge planning process, led by the attending physician.  Recommendations may be updated based on  patient status, additional functional criteria and insurance authorization.  Follow up Recommendations No SLP follow up      Assistance Recommended at Discharge  Intermittent>Full per her needs  Functional Status Assessment Patient has had a recent decline in their functional status and demonstrates the ability to make significant improvements in function in a reasonable and predictable amount of time. (appears at her Baseline)  Frequency and Duration  (n/a)   (n/a)       Prognosis Prognosis for improved oropharyngeal function: Fair Barriers to Reach Goals: Cognitive deficits;Time post onset;Severity of deficits Barriers/Prognosis Comment: Large Hiatal Hernia - suspected Esophageal phase dysmotility; mild Cognitive decline; need for support at meals      Swallow Study   General Date of Onset: 09/14/23 HPI: Pt is a 89 y.o. female with medical history significant for HTN, Falls, Vision deficits baseline, Nocturnal tachycardia on diltiazem, plasmacytoma not on treatment and history of recurrent UTIs frequent falls, who was brought from NH for evaluation of weakness cough and increasing confusion. This was the same admitting dx as her recent admit on 09/08/23. She endorses generalized weakness.  She also endorses swelling of her lower extremity that is her baseline.  She reports that the swelling and the pain gets worse when she lays in bed all day.  Daughter stated pt has not been adjusting well to the move to [facility]; doesn't feel she is well-taken care of there.   Patient who has foot drop and uses a brace and a walker had not been as ambulant as she used to be over the past several weeks and also had a chronic cough and been sitting up in the recliner to sleep.  Daughter states that she has overall been declining in recent weeks requiring move to [place](prior to that, pt had been staying w/ the 3 dtrs on different days).   CT Of Chest: evidence of small bilateral pleural  effusions with  associated bibasilar atelectasis. Slightly better aeration in the  left lung base.  Stable 1.4 cm subcarinal lymph node likely reactive.  Moderate size hiatal hernia.  Aortic atherosclerosis. Atherosclerotic coronary artery disease.  Large Hiatal Hernia per Imaging baseline. Type of Study: Bedside Swallow Evaluation Previous Swallow Assessment: 09/12/23 - mech soft diet w/ meats minced, moistened foods Diet Prior to this Study: Dysphagia 3 (mechanical soft);Thin liquids (Level 0) (baseline) Temperature Spikes Noted: No (wbc 14.1 trending down) Respiratory Status: Nasal cannula (1L) History of Recent Intubation: No Behavior/Cognition: Alert;Cooperative;Pleasant mood;Distractible;Requires cueing (suspected mild Cognitive decline baseline) Oral Cavity Assessment: Within Functional Limits Oral Care Completed by SLP: Recent completion by staff Oral Cavity - Dentition: Dentures, top;Dentures, bottom (in place) Vision: Impaired for self-feeding (baseline vision deficits) Self-Feeding Abilities: Able to feed self;Needs assist;Needs set up (support at meal) Patient Positioning: Upright in bed (needed support for sitting fully upright) Baseline Vocal Quality: Normal Volitional Cough: Strong Volitional Swallow: Able to elicit    Oral/Motor/Sensory Function Overall Oral Motor/Sensory Function: Within functional limits   Ice Chips Ice  chips: Not tested   Thin Liquid Thin Liquid: Within functional limits Presentation: Cup;Self Fed;Straw (3 via cup; 10+ via straw) Other Comments: timid oral prep d/t decreased awareness of cup at lips secondary to vision deficits    Nectar Thick Nectar Thick Liquid: Not tested   Honey Thick Honey Thick Liquid: Not tested   Puree Puree: Within functional limits Presentation: Self Fed;Spoon (supported; 8 trials)   Solid     Solid: Within functional limits (w/ the mech soft foods; meats minced -- secondary to loose Dentures) Presentation: Spoon;Self Fed  (supported; 10+ trials) Oral Phase Impairments: Impaired mastication (min lengthy w/ the loose Dentures; attention) Oral Phase Functional Implications:  (functional w/ meats minced, moistened foods) Pharyngeal Phase Impairments:  (none)         Jerilynn Som, MS, CCC-SLP Speech Language Pathologist Rehab Services; [facility] [address] [phone] (ascom) Idonna Heeren 09/16/2023,2:50 PM

## 2023-09-16 NOTE — Progress Notes (Signed)
PROGRESS NOTE    Amy [name]  XLK:[id number] DOB: 08-30-1929 DOA: 09/14/2023 PCP: Danella Penton, MD     Brief Narrative:    [name] is a 89 year old female with history of GERD, hypertension, depression, anxiety, CAD, who presents to the emergency department for chief concerns of worsening shortness of breath and cough.  At bedside, patient patient was able to tell me her name, age, location, current calendar year.   Patient is eating dinner and she states that the piece taste good.  She states she feels better here than she did at the facility.   Patient reports that she was short of breath and had increased cough.  She denies trauma to her person.  She reports that her bilateral shoulder joints hurt her and this happens sometimes.   Granddaughter at bedside states that she was endorsing some thoracic discomfort.  Patient states that this comfort is surrounding the area where she had the compression fracture with a cement fix.   Patient states she forgot all about that.     Patient is requesting to not go back to that facility.  She endorses generalized weakness.  She also endorses swelling of her lower extremity that is her baseline.  She reports that the swelling and the pain gets worse when she lays in bed all day.     She reports that the facility was not nice to her and did not take good care of her.  They refused to turn off the TV stating that he was not their job. She reports that the facility refused to let her get out of bed and states that she has to stay in bed all day.   Assessment & Plan:   Principal Problem:   Shortness of breath Active Problems:   Atelectasis, bilateral   CAD in native artery   Benign essential HTN   Mixed hyperlipidemia   Hx of deep vein thrombophlebitis of lower extremity   Leukocytosis   History of vertebral compression fracture   Weakness   Depression with anxiety  # Cough Likely sequelae of recent pneumonia.  CTA no  signs of PE. Does have bibasilar atelectasis that is likely contributing. No focal infiltrate or pulm edema to suggest overt chf but bnp is mildly elevated and there are small b/l pleural effusions. Troponins are negative. Wbc elevated but was on steroids at home. Covid negative. Rvp last week negative. Procal neg but does have productive cough - f/u tte - unasyn given report today of difficulty swallowing, will also enlist slp swallow eval - incentive spirometer, flutter valve  # Hypoxic respiratory failure O2 hovering around 90 off oxygen, normalized on 1 liter - Excello O2, wean as able  # T11/12 compression fractures S/p kyphoplasty in 2019, patient's main complaint is cervical/thoracic neck pain. Mri c/t spine stable from priors - lidocaine patch, duloxetine  # HTN BP low today, improved with small bolus - hold home dilt, metop  # CAD No chest pain, normal trops - cont home asa   # Dementia - cont home seroquel    DVT prophylaxis: lovenox Code Status: dnr Family Communication: daughter updated telephonically  Level of care: Progressive Status is: Inpatient Remains inpatient appropriate because: IV abx, hypotensive today    Consultants:  none  Procedures: none  Antimicrobials:  none    Subjective: Reports ongoing neck/shoulder pain  Objective: Vitals:   09/15/23 2253 09/16/23 0345 09/16/23 0849 09/16/23 0907  BP: (!) 121/57 107/75 (!) 89/48 (!) 80/46  Pulse: 70 67 66   Resp: 18 18 (!) 22 19  Temp: 98.3 F (36.8 C) 98.3 F (36.8 C) 98.6 F (37 C)   TempSrc: Oral Oral Oral   SpO2: 92% 91% 91%   Weight:  65.6 kg      Intake/Output Summary (Last 24 hours) at 09/16/2023 1030 Last data filed at 09/16/2023 0505 Gross per 24 hour  Intake --  Output 700 ml  Net -700 ml   Filed Weights   09/16/23 0345  Weight: 65.6 kg    Examination:  General exam: NAD, chronically ill appearing Respiratory system: rales at bases, scattered rhonchi Cardiovascular  system: S1 & S2 heard, RRR. No JVD, murmurs, rubs, gallops or clicks. No pedal edema. Gastrointestinal system: Abdomen is nondistended, soft and nontender. No organomegaly or masses felt. Normal bowel sounds heard. Central nervous system: Alert, moving all 4 Extremities: Symmetric 5 x 5 power. Skin: No rashes, lesions or ulcers Psychiatry: calm    Data Reviewed: I have personally reviewed following labs and imaging studies  CBC: Recent Labs  Lab 09/10/23 0611 09/11/23 0348 09/14/23 1133 09/15/23 0349  WBC 9.1 8.3 21.6* 14.1*  NEUTROABS  --   --  18.2*  --   HGB 11.0* 11.4* 12.6 11.4*  HCT 34.6* 35.4* 40.8 35.7*  MCV 89.2 87.0 91.9 88.8  PLT 247 277 396 358   Basic Metabolic Panel: Recent Labs  Lab 09/10/23 0611 09/11/23 0348 09/14/23 1133 09/15/23 0349 09/16/23 0937  NA 135 138 135 139 135  K 3.5 3.6 4.3 4.4 4.1  CL 97* 97* 100 106 102  CO2 28 27 25 25 23   GLUCOSE 102* 124* 106* 89 130*  BUN 30* 24* 19 16 15   CREATININE 0.75 0.60 0.69 0.55 0.60  CALCIUM 8.6* 8.9 8.9 8.7* 8.6*  MG 2.0 2.1  --   --   --    GFR: Estimated Creatinine Clearance: 40.4 mL/min (by C-G formula based on SCr of 0.6 mg/dL). Liver Function Tests: Recent Labs  Lab 09/14/23 1133  AST 40  ALT 37  ALKPHOS 101  BILITOT 0.6  PROT 6.4*  ALBUMIN 2.8*   No results for input(s): "LIPASE", "AMYLASE" in the last 168 hours. No results for input(s): "AMMONIA" in the last 168 hours. Coagulation Profile: No results for input(s): "INR", "PROTIME" in the last 168 hours.  Cardiac Enzymes: No results for input(s): "CKTOTAL", "CKMB", "CKMBINDEX", "TROPONINI" in the last 168 hours. BNP (last 3 results) No results for input(s): "PROBNP" in the last 8760 hours. HbA1C: No results for input(s): "HGBA1C" in the last 72 hours. CBG: Recent Labs  Lab 09/09/23 2132  GLUCAP 127*   Lipid Profile: No results for input(s): "CHOL", "HDL", "LDLCALC", "TRIG", "CHOLHDL", "LDLDIRECT" in the last 72  hours. Thyroid Function Tests: No results for input(s): "TSH", "T4TOTAL", "FREET4", "T3FREE", "THYROIDAB" in the last 72 hours. Anemia Panel: No results for input(s): "VITAMINB12", "FOLATE", "FERRITIN", "TIBC", "IRON", "RETICCTPCT" in the last 72 hours. Urine analysis:    Component Value Date/Time   COLORURINE STRAW (A) 09/14/2023 1758   APPEARANCEUR HAZY (A) 09/14/2023 1758   LABSPEC 1.015 09/14/2023 1758   PHURINE 7.0 09/14/2023 1758   GLUCOSEU NEGATIVE 09/14/2023 1758   HGBUR NEGATIVE 09/14/2023 1758   BILIRUBINUR NEGATIVE 09/14/2023 1758   BILIRUBINUR neg 05/13/2016 1055   KETONESUR NEGATIVE 09/14/2023 1758   PROTEINUR NEGATIVE 09/14/2023 1758   UROBILINOGEN 0.2 05/13/2016 1055   NITRITE NEGATIVE 09/14/2023 1758   LEUKOCYTESUR LARGE (A) 09/14/2023 1758   Sepsis Labs: @  LABRCNTIP(procalcitonin:4,lacticidven:4)  ) Recent Results (from the past 240 hour(s))  Culture, blood (Routine x 2)     Status: None   Collection Time: 09/08/23  8:34 PM   Specimen: BLOOD  Result Value Ref Range Status   Specimen Description BLOOD BLOOD RIGHT HAND  Final   Special Requests   Final    BOTTLES DRAWN AEROBIC AND ANAEROBIC Blood Culture adequate volume   Culture   Final    NO GROWTH 5 DAYS Performed at [facility], [address], [place], [place] [address]    Report Status 09/13/2023 FINAL  Final  Culture, blood (Routine x 2)     Status: None   Collection Time: 09/08/23  8:34 PM   Specimen: BLOOD  Result Value Ref Range Status   Specimen Description BLOOD BLOOD RIGHT ARM  Final   Special Requests   Final    BOTTLES DRAWN AEROBIC AND ANAEROBIC Blood Culture results may not be optimal due to an excessive volume of blood received in culture bottles   Culture   Final    NO GROWTH 5 DAYS Performed at [facility], [address], [place], [place] [address]    Report Status 09/13/2023 FINAL  Final  Resp panel by RT-PCR (RSV, Flu A&B, Covid) Anterior Nasal Swab      Status: None   Collection Time: 09/08/23  9:37 PM   Specimen: Anterior Nasal Swab  Result Value Ref Range Status   SARS Coronavirus 2 by RT PCR NEGATIVE NEGATIVE Final    Comment: (NOTE) SARS-CoV-2 target nucleic acids are NOT DETECTED.  The SARS-CoV-2 RNA is generally detectable in upper respiratory specimens during the acute phase of infection. The lowest concentration of SARS-CoV-2 viral copies this assay can detect is 138 copies/mL. A negative result does not preclude SARS-Cov-2 infection and should not be used as the sole basis for treatment or other patient management decisions. A negative result may occur with  improper specimen collection/handling, submission of specimen other than nasopharyngeal swab, presence of viral mutation(s) within the areas targeted by this assay, and inadequate number of viral copies(<138 copies/mL). A negative result must be combined with clinical observations, patient history, and epidemiological information. The expected result is Negative.  Fact Sheet for Patients:  BloggerCourse.com  Fact Sheet for Healthcare Providers:  SeriousBroker.it  This test is no t yet approved or cleared by the [place] FDA and  has been authorized for detection and/or diagnosis of SARS-CoV-2 by FDA under an Emergency Use Authorization (EUA). This EUA will remain  in effect (meaning this test can be used) for the duration of the COVID-19 declaration under Section 564(b)(1) of the Act, 21 U.S.C.section 360bbb-3(b)(1), unless the authorization is terminated  or revoked sooner.       Influenza A by PCR NEGATIVE NEGATIVE Final   Influenza B by PCR NEGATIVE NEGATIVE Final    Comment: (NOTE) The Xpert Xpress SARS-CoV-2/FLU/RSV plus assay is intended as an aid in the diagnosis of influenza from Nasopharyngeal swab specimens and should not be used as a sole basis for treatment. Nasal washings and aspirates are  unacceptable for Xpert Xpress SARS-CoV-2/FLU/RSV testing.  Fact Sheet for Patients: BloggerCourse.com  Fact Sheet for Healthcare Providers: SeriousBroker.it  This test is not yet approved or cleared by the [place] FDA and has been authorized for detection and/or diagnosis of SARS-CoV-2 by FDA under an Emergency Use Authorization (EUA). This EUA will remain in effect (meaning this test can be used) for the duration of the  COVID-19 declaration under Section 564(b)(1) of the Act, 21 U.S.C. section 360bbb-3(b)(1), unless the authorization is terminated or revoked.     Resp Syncytial Virus by PCR NEGATIVE NEGATIVE Final    Comment: (NOTE) Fact Sheet for Patients: BloggerCourse.com  Fact Sheet for Healthcare Providers: SeriousBroker.it  This test is not yet approved or cleared by the [place] FDA and has been authorized for detection and/or diagnosis of SARS-CoV-2 by FDA under an Emergency Use Authorization (EUA). This EUA will remain in effect (meaning this test can be used) for the duration of the COVID-19 declaration under Section 564(b)(1) of the Act, 21 U.S.C. section 360bbb-3(b)(1), unless the authorization is terminated or revoked.  Performed at [facility], [address] [place], [place] [address]   Urine Culture     Status: None   Collection Time: 09/09/23  5:27 AM   Specimen: Urine, Random  Result Value Ref Range Status   Specimen Description   Final    URINE, RANDOM Performed at [facility], [address] [place], [place] [address]    Special Requests   Final    NONE Reflexed from [id number] Performed at [facility], [address], [place], [place] [address]    Culture   Final    NO GROWTH Performed at [facility], [address], [place], [place] [address]    Report Status 09/10/2023 FINAL  Final  MRSA Next  Gen by PCR, Nasal     Status: None   Collection Time: 09/09/23  1:51 PM   Specimen: Nasal Mucosa; Nasal Swab  Result Value Ref Range Status   MRSA by PCR Next Gen NOT DETECTED NOT DETECTED Final    Comment: (NOTE) The GeneXpert MRSA Assay (FDA approved for NASAL specimens only), is one component of a comprehensive MRSA colonization surveillance program. It is not intended to diagnose MRSA infection nor to guide or monitor treatment for MRSA infections. Test performance is not FDA approved in patients less than 61 years old. Performed at [facility], [address] [place], [place] [address]   Respiratory (~20 pathogens) panel by PCR     Status: None   Collection Time: 09/09/23  1:55 PM  Result Value Ref Range Status   Adenovirus NOT DETECTED NOT DETECTED Final   Coronavirus 229E NOT DETECTED NOT DETECTED Final    Comment: (NOTE) The Coronavirus on the Respiratory Panel, DOES NOT test for the novel  Coronavirus (2019 nCoV)    Coronavirus HKU1 NOT DETECTED NOT DETECTED Final   Coronavirus NL63 NOT DETECTED NOT DETECTED Final   Coronavirus OC43 NOT DETECTED NOT DETECTED Final   Metapneumovirus NOT DETECTED NOT DETECTED Final   Rhinovirus / Enterovirus NOT DETECTED NOT DETECTED Final   Influenza A NOT DETECTED NOT DETECTED Final   Influenza B NOT DETECTED NOT DETECTED Final   Parainfluenza Virus 1 NOT DETECTED NOT DETECTED Final   Parainfluenza Virus 2 NOT DETECTED NOT DETECTED Final   Parainfluenza Virus 3 NOT DETECTED NOT DETECTED Final   Parainfluenza Virus 4 NOT DETECTED NOT DETECTED Final   Respiratory Syncytial Virus NOT DETECTED NOT DETECTED Final   Bordetella pertussis NOT DETECTED NOT DETECTED Final   Bordetella Parapertussis NOT DETECTED NOT DETECTED Final   Chlamydophila pneumoniae NOT DETECTED NOT DETECTED Final   Mycoplasma pneumoniae NOT DETECTED NOT DETECTED Final    Comment: Performed at [facility], [address], [place], [place] [address]   Resp panel by RT-PCR (RSV, Flu A&B, Covid)  Anterior Nasal Swab     Status: None   Collection Time: 09/14/23 11:34 AM   Specimen: Anterior Nasal Swab  Result Value Ref Range Status   SARS Coronavirus 2 by RT PCR NEGATIVE NEGATIVE Final    Comment: (NOTE) SARS-CoV-2 target nucleic acids are NOT DETECTED.  The SARS-CoV-2 RNA is generally detectable in upper respiratory specimens during the acute phase of infection. The lowest concentration of SARS-CoV-2 viral copies this assay can detect is 138 copies/mL. A negative result does not preclude SARS-Cov-2 infection and should not be used as the sole basis for treatment or other patient management decisions. A negative result may occur with  improper specimen collection/handling, submission of specimen other than nasopharyngeal swab, presence of viral mutation(s) within the areas targeted by this assay, and inadequate number of viral copies(<138 copies/mL). A negative result must be combined with clinical observations, patient history, and epidemiological information. The expected result is Negative.  Fact Sheet for Patients:  BloggerCourse.com  Fact Sheet for Healthcare Providers:  SeriousBroker.it  This test is no t yet approved or cleared by the [place] FDA and  has been authorized for detection and/or diagnosis of SARS-CoV-2 by FDA under an Emergency Use Authorization (EUA). This EUA will remain  in effect (meaning this test can be used) for the duration of the COVID-19 declaration under Section 564(b)(1) of the Act, 21 U.S.C.section 360bbb-3(b)(1), unless the authorization is terminated  or revoked sooner.       Influenza A by PCR NEGATIVE NEGATIVE Final   Influenza B by PCR NEGATIVE NEGATIVE Final    Comment: (NOTE) The Xpert Xpress SARS-CoV-2/FLU/RSV plus assay is intended as an aid in the diagnosis of influenza from Nasopharyngeal swab specimens and should not be used  as a sole basis for treatment. Nasal washings and aspirates are unacceptable for Xpert Xpress SARS-CoV-2/FLU/RSV testing.  Fact Sheet for Patients: BloggerCourse.com  Fact Sheet for Healthcare Providers: SeriousBroker.it  This test is not yet approved or cleared by the [place] FDA and has been authorized for detection and/or diagnosis of SARS-CoV-2 by FDA under an Emergency Use Authorization (EUA). This EUA will remain in effect (meaning this test can be used) for the duration of the COVID-19 declaration under Section 564(b)(1) of the Act, 21 U.S.C. section 360bbb-3(b)(1), unless the authorization is terminated or revoked.     Resp Syncytial Virus by PCR NEGATIVE NEGATIVE Final    Comment: (NOTE) Fact Sheet for Patients: BloggerCourse.com  Fact Sheet for Healthcare Providers: SeriousBroker.it  This test is not yet approved or cleared by the [place] FDA and has been authorized for detection and/or diagnosis of SARS-CoV-2 by FDA under an Emergency Use Authorization (EUA). This EUA will remain in effect (meaning this test can be used) for the duration of the COVID-19 declaration under Section 564(b)(1) of the Act, 21 U.S.C. section 360bbb-3(b)(1), unless the authorization is terminated or revoked.  Performed at [facility], [address] [place], [place] [address]   Culture, blood (routine x 2)     Status: None (Preliminary result)   Collection Time: 09/14/23 12:08 PM   Specimen: BLOOD  Result Value Ref Range Status   Specimen Description BLOOD LEFT ANTECUBITAL  Final   Special Requests   Final    BOTTLES DRAWN AEROBIC AND ANAEROBIC Blood Culture results may not be optimal due to an inadequate volume of blood received in culture bottles   Culture   Final    NO GROWTH 2 DAYS Performed at [facility],  [address], [place], [place]  [address]    Report Status PENDING  Incomplete  Culture, blood (routine x 2)     Status: None (Preliminary result)   Collection Time: 09/14/23 12:13 PM   Specimen: BLOOD  Result Value Ref Range Status   Specimen Description BLOOD BLOOD LEFT WRIST  Final   Special Requests   Final    BOTTLES DRAWN AEROBIC AND ANAEROBIC Blood Culture results may not be optimal due to an inadequate volume of blood received in culture bottles   Culture   Final    NO GROWTH 2 DAYS Performed at [facility], [address], [place], [place] [address]    Report Status PENDING  Incomplete         Radiology Studies: MR THORACIC SPINE WO CONTRAST  Result Date: 09/15/2023 CLINICAL DATA:  Back pain EXAM: MRI THORACIC SPINE WITHOUT CONTRAST TECHNIQUE: Multiplanar, multisequence MR imaging of the thoracic spine was performed. No intravenous contrast was administered. COMPARISON:  MRI 06/15/2018, CT 09/10/2023 FINDINGS: Alignment:  Exaggerated thoracic kyphosis.  No static listhesis. Vertebrae: Chronic T11 and T12 vertebral body compression fracture status post cement augmentation. No progression of height loss. No residual marrow edema. No acute fractures. No evidence of discitis. No marrow replacing bone lesion within the thoracic spine. Cord:  Normal signal and morphology. Paraspinal and other soft tissues: Small bilateral pleural effusions. Hiatal hernia. Disc levels: No focal disc protrusion. No foraminal or canal stenosis at any level within the thoracic spine. IMPRESSION: 1. Chronic T11 and T12 vertebral body compression fractures status post cement augmentation. 2. No acute fracture or traumatic malalignment of the thoracic spine. 3. No foraminal or canal stenosis at any level within the thoracic spine. 4. Small bilateral pleural effusions. Electronically Signed   By: Duanne Guess D.O.   On: 09/15/2023 18:48   MR CERVICAL SPINE WO CONTRAST  Result Date: 09/15/2023 CLINICAL DATA:  Neck pain EXAM: MRI  CERVICAL SPINE WITHOUT CONTRAST TECHNIQUE: Multiplanar, multisequence MR imaging of the cervical spine was performed. No intravenous contrast was administered. COMPARISON:  MRI 08/02/2020 FINDINGS: Alignment: Mildly exaggerated cervical lordosis. Grade 1 anterolisthesis of C6 on C7. Trace retrolisthesis at C5-6. Vertebrae: Postsurgical changes from posterior fusion of C1-C4 with associated metallic susceptibility artifact. Chronic marrow replacing lesion within the clivus. No new marrow replacing bone lesions. No fracture. No evidence of discitis. Cord: No convincing cervical cord signal abnormality. Posterior Fossa, vertebral arteries, paraspinal tissues: Negative. Disc levels: C2-C3: Prior decompression. No significant disc protrusion, foraminal stenosis, or canal stenosis. C3-C4: Prior decompression. No significant disc protrusion, foraminal stenosis, or canal stenosis. C4-C5: Retrolisthesis with disc osteophyte complex. Mild canal stenosis and moderate-severe bilateral foraminal stenosis. Appearance similar to prior. C5-C6: Disc osteophyte complex with right greater than left facet and uncovertebral arthropathy. Moderate canal stenosis with moderate-severe right and mild left foraminal stenosis. No significant interval progression. C6-C7: Disc osteophyte complex with right-sided facet arthropathy. No canal stenosis. Mild bilateral foraminal stenosis. No significant interval progression. C7-T1: Degenerative facet arthropathy without foraminal or canal stenosis. IMPRESSION: 1. Multilevel cervical spondylosis, not significantly progressed from prior. 2. Moderate canal stenosis at C5-6 with moderate-severe right and mild left foraminal stenosis. 3. Mild canal stenosis and moderate-severe bilateral foraminal stenosis at C4-5. 4. 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 No new marrow replacing bone lesions. Electronically Signed   By: Duanne Guess D.O.   On: 09/15/2023  18:36   CT Angio Chest PE W/Cm &/Or Wo Cm  Result Date: 09/14/2023 CLINICAL DATA:  Shortness of breath and worsening cough 2 days. Possible pulmonary embolism. EXAM: CT ANGIOGRAPHY CHEST WITH CONTRAST TECHNIQUE: Multidetector CT imaging of the chest was performed using the standard protocol during bolus administration of intravenous contrast. Multiplanar CT image reconstructions and MIPs were obtained to evaluate the vascular anatomy. RADIATION DOSE REDUCTION: This exam was performed according to the departmental dose-optimization program which includes automated exposure control, adjustment of the mA and/or kV according to patient size and/or use of iterative reconstruction technique. CONTRAST:  60mL OMNIPAQUE IOHEXOL 350 MG/ML SOLN COMPARISON:  09/10/2023, 09/08/2023 FINDINGS: Cardiovascular: Mild stable cardiomegaly. Moderate calcified plaque over the left main and 3 vessel coronary arteries. Thoracic aorta is normal caliber. There is calcified plaque over the descending thoracic aorta. Pulmonary arterial system is well opacified without evidence of emboli. Stable prominence of the right main pulmonary artery. Remaining vascular structures are unremarkable. Mediastinum/Nodes: 1.4 cm subcarinal lymph node without significant change. No other significant mediastinal or hilar adenopathy. Moderate size hiatal hernia. Lungs/Pleura: Lungs are adequately inflated. Mild stable biapical scarring. Continued evidence of small bilateral pleural effusions with associated bibasilar atelectasis. Slightly better aeration in the left lung base. Airways are unremarkable. Upper Abdomen: Calcified plaque over the abdominal aorta. Nonobstructing 3 mm stone over the upper pole right kidney. Stable prominence of the common bile duct and central intrahepatic ducts. Musculoskeletal: 2 adjacent lower thoracic spine compression fractures post kyphoplasty unchanged. No acute findings. Review of the MIP images confirms the above  findings. IMPRESSION: 1. No evidence of pulmonary embolism. 2. Continued evidence of small bilateral pleural effusions with associated bibasilar atelectasis. Slightly better aeration in the left lung base. 3. Stable 1.4 cm subcarinal lymph node likely reactive. 4. Moderate size hiatal hernia. 5. Aortic atherosclerosis. Atherosclerotic coronary artery disease. 6. Nonobstructing right renal stone. 7. Stable prominence of the common bile duct and central intrahepatic ducts. Aortic Atherosclerosis (ICD10-I70.0). Electronically Signed   By: Elberta Fortis M.D.   On: 09/14/2023 15:32   DG Chest 2 View  Result Date: 09/14/2023 CLINICAL DATA:  Chest pain, shortness of breath, and cough. Hypoxia. EXAM: CHEST - 2 VIEW COMPARISON:  09/08/2023 FINDINGS: Stable mild cardiomegaly. Stable small bilateral pleural effusions. Decreased atelectasis seen in the left lung base since prior exam. Mild right basilar atelectasis shows no significant change. IMPRESSION: Decreased left basilar atelectasis. Stable mild right basilar atelectasis and small bilateral pleural effusions. Electronically Signed   By: Danae Orleans M.D.   On: 09/14/2023 12:28        Scheduled Meds:  aspirin EC  81 mg Oral Daily   DULoxetine  20 mg Oral Daily   enoxaparin (LOVENOX) injection  40 mg Subcutaneous Q24H   lidocaine  1 patch Transdermal Q24H   melatonin  2.5 mg Oral QHS   multivitamin with minerals  1 tablet Oral Daily   QUEtiapine  50 mg Oral BID   Continuous Infusions:   LOS: 2 days     Silvano Bilis, MD [facility]   If 7PM-7AM, please contact night-coverage [email or web address] Password [email or web address] 09/16/2023, 10:30 AM

## 2023-09-16 NOTE — Progress Notes (Signed)
Occupational Therapy Treatment Patient Details Name: Ayven Coufal MRN: [id number] DOB: 09/22/1929 Today's Date: 09/16/2023   History of present illness Aeris Nechelle Perrins is a 89 year old female with history of HTN, HLD, DVT, plasmacytoma not on treatment presenting to the emergency department for evaluation of weakness and cough.   OT comments  Chart reviewed, pt greeted in bed, agreeable to OT tx session. Pt is oriented to self, HOH, follows directions with increased time. Tx session targeted improving functional activity tolerance for improved participation in ADL tasks. MOD A required for bed mobility, STS with MOD A, SPT to bedside chair with MOD A+2. Increased assist to bring cup to mouth on this date with MIN-MOD A. Improved performance with proximal stability. Pt is left in chair, all needs met. OT will follow acutely.       If plan is discharge home, recommend the following:  A lot of help with walking and/or transfers;A lot of help with bathing/dressing/bathroom;Assistance with cooking/housework;Direct supervision/assist for medications management;Direct supervision/assist for financial management;Help with stairs or ramp for entrance;Assist for transportation   Equipment Recommendations  None recommended by OT    Recommendations for Other Services      Precautions / Restrictions Precautions Precautions: Fall Precaution Comments: R foot drop Other Brace: soft AFO (brace) for R LE per chart, not in room with pt Restrictions Weight Bearing Restrictions: No       Mobility Bed Mobility Overal bed mobility: Needs Assistance Bed Mobility: Supine to Sit     Supine to sit: Mod assist, HOB elevated     General bed mobility comments: multi modal vcs for participation    Transfers Overall transfer level: Needs assistance Equipment used: 2 person hand held assist Transfers: Sit to/from Stand Sit to Stand: Mod assist, +2 physical assistance                  Balance Overall balance assessment: History of Falls, Needs assistance Sitting-balance support: Feet supported, Single extremity supported, No upper extremity supported Sitting balance-Leahy Scale: Good     Standing balance support: Bilateral upper extremity supported, During functional activity, Reliant on assistive device for balance Standing balance-Leahy Scale: Poor                             ADL either performed or assessed with clinical judgement   ADL Overall ADL's : Needs assistance/impaired                         Toilet Transfer: Moderate assistance;Stand-pivot;Cueing for safety;Cueing for sequencing Toilet Transfer Details (indicate cue type and reason): simulated to bedside chair                Extremity/Trunk Assessment              Vision       Perception     Praxis      Cognition Arousal: Alert Behavior During Therapy: WFL for tasks assessed/performed Overall Cognitive Status: Impaired/Different from baseline Area of Impairment: Following commands, Problem solving                       Following Commands: Follows one step commands with increased time     Problem Solving: Slow processing, Requires verbal cues, Requires tactile cues          Exercises      Shoulder Instructions       General Comments  vss throughout    Pertinent Vitals/ Pain       Pain Assessment Pain Assessment: No/denies pain  Home Living                                          Prior Functioning/Environment              Frequency  Min 1X/week        Progress Toward Goals  OT Goals(current goals can now be found in the care plan section)  Progress towards OT goals: Progressing toward goals     Plan      Co-evaluation                 AM-PAC OT "6 Clicks" Daily Activity     Outcome Measure   Help from another person eating meals?: A Lot Help from another person taking care of personal  grooming?: A Little Help from another person toileting, which includes using toliet, bedpan, or urinal?: A Lot Help from another person bathing (including washing, rinsing, drying)?: A Lot Help from another person to put on and taking off regular upper body clothing?: A Lot Help from another person to put on and taking off regular lower body clothing?: A Lot 6 Click Score: 13    End of Session Equipment Utilized During Treatment: Oxygen  OT Visit Diagnosis: Unsteadiness on feet (R26.81);History of falling (Z91.81);Muscle weakness (generalized) (M62.81)   Activity Tolerance Patient tolerated treatment well   Patient Left in chair;with call bell/phone within reach;with chair alarm set;with family/visitor present   Nurse Communication Mobility status        Time: 1610-9604 OT Time Calculation (min): 17 min  Charges: OT General Charges $OT Visit: 1 Visit OT Treatments $Therapeutic Activity: 8-22 mins  Oleta Mouse, OTD OTR/L  09/16/23, 3:46 PM

## 2023-09-17 DIAGNOSIS — R0602 Shortness of breath: Secondary | ICD-10-CM | POA: Diagnosis not present

## 2023-09-17 LAB — BASIC METABOLIC PANEL
Anion gap: 7 (ref 5–15)
BUN: 13 mg/dL (ref 8–23)
CO2: 25 mmol/L (ref 22–32)
Calcium: 8.1 mg/dL — ABNORMAL LOW (ref 8.9–10.3)
Chloride: 107 mmol/L (ref 98–111)
Creatinine, Ser: 0.58 mg/dL (ref 0.44–1.00)
GFR, Estimated: >60 mL/min (ref >60)
Glucose, Bld: 112 mg/dL — ABNORMAL HIGH (ref 70–99)
Potassium: 3.7 mmol/L (ref 3.5–5.1)
Sodium: 139 mmol/L (ref 135–145)

## 2023-09-17 MED ORDER — METOPROLOL SUCCINATE ER 25 MG PO TB24
25.0000 mg | ORAL_TABLET | Freq: Every day | ORAL | Status: DC
  Administered 2023-09-17 – 2023-09-22 (×6): 25 mg via ORAL
  Filled 2023-09-17 (×6): qty 1

## 2023-09-17 MED ORDER — SENNOSIDES-DOCUSATE SODIUM 8.6-50 MG PO TABS
1.0000 | ORAL_TABLET | Freq: Every day | ORAL | Status: DC
  Administered 2023-09-17 – 2023-09-21 (×5): 1 via ORAL
  Filled 2023-09-17 (×5): qty 1

## 2023-09-17 MED ORDER — POLYETHYLENE GLYCOL 3350 17 G PO PACK
17.0000 g | PACK | Freq: Every day | ORAL | Status: DC
  Administered 2023-09-17 – 2023-09-22 (×6): 17 g via ORAL
  Filled 2023-09-17 (×6): qty 1

## 2023-09-17 NOTE — Progress Notes (Addendum)
PROGRESS NOTE    Amy [name]  ZHY:[id number] DOB: December 31, 1928 DOA: 09/14/2023 PCP: Danella Penton, MD     Brief Narrative:    [name] is a 89 year old female with history of GERD, hypertension, depression, anxiety, CAD, who presents to the emergency department for chief concerns of worsening shortness of breath and cough.  At bedside, patient patient was able to tell me her name, age, location, current calendar year.   Patient is eating dinner and she states that the piece taste good.  She states she feels better here than she did at the facility.   Patient reports that she was short of breath and had increased cough.  She denies trauma to her person.  She reports that her bilateral shoulder joints hurt her and this happens sometimes.   Granddaughter at bedside states that she was endorsing some thoracic discomfort.  Patient states that this comfort is surrounding the area where she had the compression fracture with a cement fix.   Patient states she forgot all about that.     Patient is requesting to not go back to that facility.  She endorses generalized weakness.  She also endorses swelling of her lower extremity that is her baseline.  She reports that the swelling and the pain gets worse when she lays in bed all day.     She reports that the facility was not nice to her and did not take good care of her.  They refused to turn off the TV stating that he was not their job. She reports that the facility refused to let her get out of bed and states that she has to stay in bed all day.   Assessment & Plan:   Principal Problem:   Shortness of breath Active Problems:   Atelectasis, bilateral   CAD in native artery   Benign essential HTN   Mixed hyperlipidemia   Hx of deep vein thrombophlebitis of lower extremity   Leukocytosis   History of vertebral compression fracture   Weakness   Depression with anxiety  # Cough, possible aspiration pneumonia Likely sequelae of  recent pneumonia.  CTA no signs of PE. Does have bibasilar atelectasis/infiltrate that is likely contributing. No pulm edema to suggest overt chf but bnp is mildly elevated and there are small b/l pleural effusions. Troponins are negative. Wbc elevated but was on steroids at home. Covid negative. Rvp last week negative. Procal neg but does have productive cough so have elected to treat for aspiration pneumonia given aspiration risk - cont unasyn - incentive spirometer, flutter valve  # Hiatal hernia # Dysphagia Likely contributes to reflux and as such risk for aspiration - slp has cleared for dysphagia 3 diet  # Hypoxic respiratory failure Requiring 1-2 liters here, 2/2 atelectasis, poss pneumonia as above - [place] O2, wean as able  # T11/12 compression fractures # Cervical stenosis S/p kyphoplasty in 2019, patient's main complaint is cervical/thoracic neck pain. Mri c/t spine stable from priors - lidocaine patch, duloxetine  # HTN Bp low yesterday, home meds held. Today bp elevated - hold home dilt - resume home metop  # CAD No chest pain, normal trops - cont home asa   # Dementia - cont home seroquel    DVT prophylaxis: lovenox Code Status: dnr Family Communication: daughter updated @ bedside 9/25  Level of care: Progressive Status is: Inpatient Remains inpatient appropriate because: IV abx, hypotensive today    Consultants:  none  Procedures: none  Antimicrobials:  unasyn    Subjective: Reports ongoing neck/shoulder pain, breathing stable, tolerating diet  Objective: Vitals:   09/16/23 1948 09/16/23 2321 09/17/23 0345 09/17/23 0735  BP: (!) 110/45 (!) 129/55 (!) 141/60 (!) 153/48  Pulse: 64 66 69 63  Resp: 16 16 16 18   Temp: (!) 97.5 F (36.4 C) 97.7 F (36.5 C) 97.7 F (36.5 C) 97.6 F (36.4 C)  TempSrc: Oral Oral Oral Oral  SpO2: 93% 96% 95% 95%  Weight:   65.5 kg     Intake/Output Summary (Last 24 hours) at 09/17/2023 0933 Last data filed at  09/17/2023 0841 Gross per 24 hour  Intake 300.06 ml  Output 1000 ml  Net -699.94 ml   Filed Weights   09/16/23 0345 09/17/23 0345  Weight: 65.6 kg 65.5 kg    Examination:  General exam: NAD, chronically ill appearing Respiratory system: rales at bases, scattered rhonchi Cardiovascular system: S1 & S2 heard, RRR. No JVD, murmurs, rubs, gallops or clicks. No pedal edema. Gastrointestinal system: Abdomen is nondistended, soft and nontender. No organomegaly or masses felt. Normal bowel sounds heard. Central nervous system: Alert, moving all 4 Extremities: warm Skin: No rashes, lesions or ulcers Psychiatry: calm    Data Reviewed: I have personally reviewed following labs and imaging studies  CBC: Recent Labs  Lab 09/11/23 0348 09/14/23 1133 09/15/23 0349  WBC 8.3 21.6* 14.1*  NEUTROABS  --  18.2*  --   HGB 11.4* 12.6 11.4*  HCT 35.4* 40.8 35.7*  MCV 87.0 91.9 88.8  PLT 277 396 358   Basic Metabolic Panel: Recent Labs  Lab 09/11/23 0348 09/14/23 1133 09/15/23 0349 09/16/23 0937 09/17/23 0609  NA 138 135 139 135 139  K 3.6 4.3 4.4 4.1 3.7  CL 97* 100 106 102 107  CO2 27 25 25 23 25   GLUCOSE 124* 106* 89 130* 112*  BUN 24* 19 16 15 13   CREATININE 0.60 0.69 0.55 0.60 0.58  CALCIUM 8.9 8.9 8.7* 8.6* 8.1*  MG 2.1  --   --   --   --    GFR: Estimated Creatinine Clearance: 40.4 mL/min (by C-G formula based on SCr of 0.58 mg/dL). Liver Function Tests: Recent Labs  Lab 09/14/23 1133  AST 40  ALT 37  ALKPHOS 101  BILITOT 0.6  PROT 6.4*  ALBUMIN 2.8*   No results for input(s): "LIPASE", "AMYLASE" in the last 168 hours. No results for input(s): "AMMONIA" in the last 168 hours. Coagulation Profile: No results for input(s): "INR", "PROTIME" in the last 168 hours.  Cardiac Enzymes: No results for input(s): "CKTOTAL", "CKMB", "CKMBINDEX", "TROPONINI" in the last 168 hours. BNP (last 3 results) No results for input(s): "PROBNP" in the last 8760 hours. HbA1C: No  results for input(s): "HGBA1C" in the last 72 hours. CBG: No results for input(s): "GLUCAP" in the last 168 hours.  Lipid Profile: No results for input(s): "CHOL", "HDL", "LDLCALC", "TRIG", "CHOLHDL", "LDLDIRECT" in the last 72 hours. Thyroid Function Tests: No results for input(s): "TSH", "T4TOTAL", "FREET4", "T3FREE", "THYROIDAB" in the last 72 hours. Anemia Panel: No results for input(s): "VITAMINB12", "FOLATE", "FERRITIN", "TIBC", "IRON", "RETICCTPCT" in the last 72 hours. Urine analysis:    Component Value Date/Time   COLORURINE STRAW (A) 09/14/2023 1758   APPEARANCEUR HAZY (A) 09/14/2023 1758   LABSPEC 1.015 09/14/2023 1758   PHURINE 7.0 09/14/2023 1758   GLUCOSEU NEGATIVE 09/14/2023 1758   HGBUR NEGATIVE 09/14/2023 1758   BILIRUBINUR NEGATIVE 09/14/2023 1758   BILIRUBINUR neg 05/13/2016 1055  KETONESUR NEGATIVE 09/14/2023 1758   PROTEINUR NEGATIVE 09/14/2023 1758   UROBILINOGEN 0.2 05/13/2016 1055   NITRITE NEGATIVE 09/14/2023 1758   LEUKOCYTESUR LARGE (A) 09/14/2023 1758   Sepsis Labs: @LABRCNTIP (procalcitonin:4,lacticidven:4)  ) Recent Results (from the past 240 hour(s))  Culture, blood (Routine x 2)     Status: None   Collection Time: 09/08/23  8:34 PM   Specimen: BLOOD  Result Value Ref Range Status   Specimen Description BLOOD BLOOD RIGHT HAND  Final   Special Requests   Final    BOTTLES DRAWN AEROBIC AND ANAEROBIC Blood Culture adequate volume   Culture   Final    NO GROWTH 5 DAYS Performed at [facility], [address], [place], [place] [address]    Report Status 09/13/2023 FINAL  Final  Culture, blood (Routine x 2)     Status: None   Collection Time: 09/08/23  8:34 PM   Specimen: BLOOD  Result Value Ref Range Status   Specimen Description BLOOD BLOOD RIGHT ARM  Final   Special Requests   Final    BOTTLES DRAWN AEROBIC AND ANAEROBIC Blood Culture results may not be optimal due to an excessive volume of blood received in culture bottles    Culture   Final    NO GROWTH 5 DAYS Performed at [facility], [address] [place], [place] [address]    Report Status 09/13/2023 FINAL  Final  Resp panel by RT-PCR (RSV, Flu A&B, Covid) Anterior Nasal Swab     Status: None   Collection Time: 09/08/23  9:37 PM   Specimen: Anterior Nasal Swab  Result Value Ref Range Status   SARS Coronavirus 2 by RT PCR NEGATIVE NEGATIVE Final    Comment: (NOTE) SARS-CoV-2 target nucleic acids are NOT DETECTED.  The SARS-CoV-2 RNA is generally detectable in upper respiratory specimens during the acute phase of infection. The lowest concentration of SARS-CoV-2 viral copies this assay can detect is 138 copies/mL. A negative result does not preclude SARS-Cov-2 infection and should not be used as the sole basis for treatment or other patient management decisions. A negative result may occur with  improper specimen collection/handling, submission of specimen other than nasopharyngeal swab, presence of viral mutation(s) within the areas targeted by this assay, and inadequate number of viral copies(<138 copies/mL). A negative result must be combined with clinical observations, patient history, and epidemiological information. The expected result is Negative.  Fact Sheet for Patients:  BloggerCourse.com  Fact Sheet for Healthcare Providers:  SeriousBroker.it  This test is no t yet approved or cleared by the [place] FDA and  has been authorized for detection and/or diagnosis of SARS-CoV-2 by FDA under an Emergency Use Authorization (EUA). This EUA will remain  in effect (meaning this test can be used) for the duration of the COVID-19 declaration under Section 564(b)(1) of the Act, 21 U.S.C.section 360bbb-3(b)(1), unless the authorization is terminated  or revoked sooner.       Influenza A by PCR NEGATIVE NEGATIVE Final   Influenza B by PCR NEGATIVE NEGATIVE Final    Comment:  (NOTE) The Xpert Xpress SARS-CoV-2/FLU/RSV plus assay is intended as an aid in the diagnosis of influenza from Nasopharyngeal swab specimens and should not be used as a sole basis for treatment. Nasal washings and aspirates are unacceptable for Xpert Xpress SARS-CoV-2/FLU/RSV testing.  Fact Sheet for Patients: BloggerCourse.com  Fact Sheet for Healthcare Providers: SeriousBroker.it  This test is not yet approved or cleared by the [place] FDA and has  been authorized for detection and/or diagnosis of SARS-CoV-2 by FDA under an Emergency Use Authorization (EUA). This EUA will remain in effect (meaning this test can be used) for the duration of the COVID-19 declaration under Section 564(b)(1) of the Act, 21 U.S.C. section 360bbb-3(b)(1), unless the authorization is terminated or revoked.     Resp Syncytial Virus by PCR NEGATIVE NEGATIVE Final    Comment: (NOTE) Fact Sheet for Patients: BloggerCourse.com  Fact Sheet for Healthcare Providers: SeriousBroker.it  This test is not yet approved or cleared by the [place] FDA and has been authorized for detection and/or diagnosis of SARS-CoV-2 by FDA under an Emergency Use Authorization (EUA). This EUA will remain in effect (meaning this test can be used) for the duration of the COVID-19 declaration under Section 564(b)(1) of the Act, 21 U.S.C. section 360bbb-3(b)(1), unless the authorization is terminated or revoked.  Performed at [facility], [address], [place], [place] [address]   Urine Culture     Status: None   Collection Time: 09/09/23  5:27 AM   Specimen: Urine, Random  Result Value Ref Range Status   Specimen Description   Final    URINE, RANDOM Performed at [facility], [address], [place], [place] [address]    Special Requests   Final    NONE Reflexed from [phone] Performed at  [facility], [address] [place], [place] [address]    Culture   Final    NO GROWTH Performed at [facility], [address], [place], [place] [address]    Report Status 09/10/2023 FINAL  Final  MRSA Next Gen by PCR, Nasal     Status: None   Collection Time: 09/09/23  1:51 PM   Specimen: Nasal Mucosa; Nasal Swab  Result Value Ref Range Status   MRSA by PCR Next Gen NOT DETECTED NOT DETECTED Final    Comment: (NOTE) The GeneXpert MRSA Assay (FDA approved for NASAL specimens only), is one component of a comprehensive MRSA colonization surveillance program. It is not intended to diagnose MRSA infection nor to guide or monitor treatment for MRSA infections. Test performance is not FDA approved in patients less than 32 years old. Performed at [facility], [address] [place], [place] [address]   Respiratory (~20 pathogens) panel by PCR     Status: None   Collection Time: 09/09/23  1:55 PM  Result Value Ref Range Status   Adenovirus NOT DETECTED NOT DETECTED Final   Coronavirus 229E NOT DETECTED NOT DETECTED Final    Comment: (NOTE) The Coronavirus on the Respiratory Panel, DOES NOT test for the novel  Coronavirus (2019 nCoV)    Coronavirus HKU1 NOT DETECTED NOT DETECTED Final   Coronavirus NL63 NOT DETECTED NOT DETECTED Final   Coronavirus OC43 NOT DETECTED NOT DETECTED Final   Metapneumovirus NOT DETECTED NOT DETECTED Final   Rhinovirus / Enterovirus NOT DETECTED NOT DETECTED Final   Influenza A NOT DETECTED NOT DETECTED Final   Influenza B NOT DETECTED NOT DETECTED Final   Parainfluenza Virus 1 NOT DETECTED NOT DETECTED Final   Parainfluenza Virus 2 NOT DETECTED NOT DETECTED Final   Parainfluenza Virus 3 NOT DETECTED NOT DETECTED Final   Parainfluenza Virus 4 NOT DETECTED NOT DETECTED Final   Respiratory Syncytial Virus NOT DETECTED NOT DETECTED Final   Bordetella pertussis NOT DETECTED NOT DETECTED Final   Bordetella Parapertussis NOT  DETECTED NOT DETECTED Final   Chlamydophila pneumoniae NOT DETECTED NOT DETECTED Final  Mycoplasma pneumoniae NOT DETECTED NOT DETECTED Final    Comment: Performed at [facility], [address], [place], [place] [address]  Resp panel by RT-PCR (RSV, Flu A&B, Covid) Anterior Nasal Swab     Status: None   Collection Time: 09/14/23 11:34 AM   Specimen: Anterior Nasal Swab  Result Value Ref Range Status   SARS Coronavirus 2 by RT PCR NEGATIVE NEGATIVE Final    Comment: (NOTE) SARS-CoV-2 target nucleic acids are NOT DETECTED.  The SARS-CoV-2 RNA is generally detectable in upper respiratory specimens during the acute phase of infection. The lowest concentration of SARS-CoV-2 viral copies this assay can detect is 138 copies/mL. A negative result does not preclude SARS-Cov-2 infection and should not be used as the sole basis for treatment or other patient management decisions. A negative result may occur with  improper specimen collection/handling, submission of specimen other than nasopharyngeal swab, presence of viral mutation(s) within the areas targeted by this assay, and inadequate number of viral copies(<138 copies/mL). A negative result must be combined with clinical observations, patient history, and epidemiological information. The expected result is Negative.  Fact Sheet for Patients:  BloggerCourse.com  Fact Sheet for Healthcare Providers:  SeriousBroker.it  This test is no t yet approved or cleared by the [place] FDA and  has been authorized for detection and/or diagnosis of SARS-CoV-2 by FDA under an Emergency Use Authorization (EUA). This EUA will remain  in effect (meaning this test can be used) for the duration of the COVID-19 declaration under Section 564(b)(1) of the Act, 21 U.S.C.section 360bbb-3(b)(1), unless the authorization is terminated  or revoked sooner.       Influenza A by PCR NEGATIVE  NEGATIVE Final   Influenza B by PCR NEGATIVE NEGATIVE Final    Comment: (NOTE) The Xpert Xpress SARS-CoV-2/FLU/RSV plus assay is intended as an aid in the diagnosis of influenza from Nasopharyngeal swab specimens and should not be used as a sole basis for treatment. Nasal washings and aspirates are unacceptable for Xpert Xpress SARS-CoV-2/FLU/RSV testing.  Fact Sheet for Patients: BloggerCourse.com  Fact Sheet for Healthcare Providers: SeriousBroker.it  This test is not yet approved or cleared by the [place] FDA and has been authorized for detection and/or diagnosis of SARS-CoV-2 by FDA under an Emergency Use Authorization (EUA). This EUA will remain in effect (meaning this test can be used) for the duration of the COVID-19 declaration under Section 564(b)(1) of the Act, 21 U.S.C. section 360bbb-3(b)(1), unless the authorization is terminated or revoked.     Resp Syncytial Virus by PCR NEGATIVE NEGATIVE Final    Comment: (NOTE) Fact Sheet for Patients: BloggerCourse.com  Fact Sheet for Healthcare Providers: SeriousBroker.it  This test is not yet approved or cleared by the [place] FDA and has been authorized for detection and/or diagnosis of SARS-CoV-2 by FDA under an Emergency Use Authorization (EUA). This EUA will remain in effect (meaning this test can be used) for the duration of the COVID-19 declaration under Section 564(b)(1) of the Act, 21 U.S.C. section 360bbb-3(b)(1), unless the authorization is terminated or revoked.  Performed at [facility], [address] [place], [place] [address]   Culture, blood (routine x 2)     Status: None (Preliminary result)   Collection Time: 09/14/23 12:08 PM   Specimen: BLOOD  Result Value Ref Range Status   Specimen Description BLOOD LEFT ANTECUBITAL  Final   Special Requests   Final    BOTTLES DRAWN AEROBIC  AND ANAEROBIC Blood Culture  results may not be optimal due to an inadequate volume of blood received in culture bottles   Culture   Final    NO GROWTH 3 DAYS Performed at [facility], [address] [place], [place] [address]    Report Status PENDING  Incomplete  Culture, blood (routine x 2)     Status: None (Preliminary result)   Collection Time: 09/14/23 12:13 PM   Specimen: BLOOD  Result Value Ref Range Status   Specimen Description BLOOD BLOOD LEFT WRIST  Final   Special Requests   Final    BOTTLES DRAWN AEROBIC AND ANAEROBIC Blood Culture results may not be optimal due to an inadequate volume of blood received in culture bottles   Culture   Final    NO GROWTH 3 DAYS Performed at [facility], [address] [place], [place] [address]    Report Status PENDING  Incomplete         Radiology Studies: ECHOCARDIOGRAM COMPLETE  Result Date: 09/16/2023    ECHOCARDIOGRAM REPORT   Patient Name:   JENE STBERNARD [facility] Date of Exam: 09/15/2023 Medical Rec #:  [id number]         Height:       65.0 in Accession #:    [id number]        Weight:       144.0 lb Date of Birth:  [birth year]         BSA:          1.720 m Patient Age:    89 years          BP:           169/69 mmHg Patient Gender: F                 HR:           75 bpm. Exam Location:  [facility] Procedure: 2D Echo, Cardiac Doppler and Color Doppler Indications:     R06.00 Dyspnea  History:         Patient has prior history of Echocardiogram examinations, most                  recent 09/12/2016. Signs/Symptoms:Dyspnea; Risk                  Factors:Hypertension and Dyslipidemia.  Sonographer:     Daphine Deutscher RDCS Referring Phys:  [id number] Wilfred Curtis OVFI Diagnosing Phys: Marcina Millard MD IMPRESSIONS  1. Left ventricular ejection fraction, by estimation, is 60 to 65%. The left ventricle has normal function. The left ventricle has no regional wall motion abnormalities. Left ventricular diastolic parameters are  consistent with Grade I diastolic dysfunction (impaired relaxation).  2. Right ventricular systolic function is normal. The right ventricular size is normal.  3. The mitral valve is normal in structure. No evidence of mitral valve regurgitation. No evidence of mitral stenosis.  4. The aortic valve is normal in structure. Aortic valve regurgitation is trivial. No aortic stenosis is present.  5. The inferior vena cava is normal in size with greater than 50% respiratory variability, suggesting right atrial pressure of 3 mmHg. FINDINGS  Left Ventricle: Left ventricular ejection fraction, by estimation, is 60 to 65%. The left ventricle has normal function. The left ventricle has no regional wall motion abnormalities. The left ventricular internal cavity size was normal in size. There is  no left ventricular hypertrophy. Left ventricular diastolic parameters are consistent with Grade I diastolic dysfunction (impaired relaxation). Right Ventricle: The right  ventricular size is normal. No increase in right ventricular wall thickness. Right ventricular systolic function is normal. Left Atrium: Left atrial size was normal in size. Right Atrium: Right atrial size was normal in size. Pericardium: There is no evidence of pericardial effusion. Mitral Valve: The mitral valve is normal in structure. No evidence of mitral valve regurgitation. No evidence of mitral valve stenosis. Tricuspid Valve: The tricuspid valve is normal in structure. Tricuspid valve regurgitation is mild . No evidence of tricuspid stenosis. Aortic Valve: The aortic valve is normal in structure. Aortic valve regurgitation is trivial. Aortic regurgitation PHT measures 355 msec. No aortic stenosis is present. Pulmonic Valve: The pulmonic valve was normal in structure. Pulmonic valve regurgitation is not visualized. No evidence of pulmonic stenosis. Aorta: The aortic root is normal in size and structure. Venous: The inferior vena cava is normal in size with  greater than 50% respiratory variability, suggesting right atrial pressure of 3 mmHg. IAS/Shunts: No atrial level shunt detected by color flow Doppler.  LEFT VENTRICLE PLAX 2D LVIDd:         4.30 cm   Diastology LVIDs:         2.90 cm   LV e' medial:    5.19 cm/s LV PW:         1.00 cm   LV E/e' medial:  14.4 LV IVS:        1.00 cm   LV e' lateral:   5.58 cm/s LVOT diam:     2.00 cm   LV E/e' lateral: 13.3 LV SV:         48 LV SV Index:   28 LVOT Area:     3.14 cm  RIGHT VENTRICLE             IVC RV Basal diam:  3.30 cm     IVC diam: 1.40 cm RV S prime:     13.20 cm/s TAPSE (M-mode): 1.6 cm LEFT ATRIUM             Index        RIGHT ATRIUM          Index LA diam:        4.00 cm 2.33 cm/m   RA Area:     7.98 cm LA Vol (A2C):   21.9 ml 12.73 ml/m  RA Volume:   16.10 ml 9.36 ml/m LA Vol (A4C):   30.2 ml 17.55 ml/m LA Biplane Vol: 25.8 ml 15.00 ml/m  AORTIC VALVE LVOT Vmax:   83.53 cm/s LVOT Vmean:  52.167 cm/s LVOT VTI:    0.153 m AI PHT:      355 msec  AORTA Ao Root diam: 3.00 cm Ao Asc diam:  3.20 cm MITRAL VALVE                TRICUSPID VALVE MV Area (PHT): 3.05 cm     TR Peak grad:   30.0 mmHg MV Decel Time: 249 msec     TR Vmax:        274.00 cm/s MV E velocity: 74.45 cm/s MV A velocity: 117.50 cm/s  SHUNTS MV E/A ratio:  0.63         Systemic VTI:  0.15 m                             Systemic Diam: 2.00 cm Marcina Millard MD Electronically signed by Marcina Millard MD Signature Date/Time: 09/16/2023/1:44:31 PM  Final    MR THORACIC SPINE WO CONTRAST  Result Date: 09/15/2023 CLINICAL DATA:  Back pain EXAM: MRI THORACIC SPINE WITHOUT CONTRAST TECHNIQUE: Multiplanar, multisequence MR imaging of the thoracic spine was performed. No intravenous contrast was administered. COMPARISON:  MRI 06/15/2018, CT 09/10/2023 FINDINGS: Alignment:  Exaggerated thoracic kyphosis.  No static listhesis. Vertebrae: Chronic T11 and T12 vertebral body compression fracture status post cement augmentation. No  progression of height loss. No residual marrow edema. No acute fractures. No evidence of discitis. No marrow replacing bone lesion within the thoracic spine. Cord:  Normal signal and morphology. Paraspinal and other soft tissues: Small bilateral pleural effusions. Hiatal hernia. Disc levels: No focal disc protrusion. No foraminal or canal stenosis at any level within the thoracic spine. IMPRESSION: 1. Chronic T11 and T12 vertebral body compression fractures status post cement augmentation. 2. No acute fracture or traumatic malalignment of the thoracic spine. 3. No foraminal or canal stenosis at any level within the thoracic spine. 4. Small bilateral pleural effusions. Electronically Signed   By: Duanne Guess D.O.   On: 09/15/2023 18:48   MR CERVICAL SPINE WO CONTRAST  Result Date: 09/15/2023 CLINICAL DATA:  Neck pain EXAM: MRI CERVICAL SPINE WITHOUT CONTRAST TECHNIQUE: Multiplanar, multisequence MR imaging of the cervical spine was performed. No intravenous contrast was administered. COMPARISON:  MRI 08/02/2020 FINDINGS: Alignment: Mildly exaggerated cervical lordosis. Grade 1 anterolisthesis of C6 on C7. Trace retrolisthesis at C5-6. Vertebrae: Postsurgical changes from posterior fusion of C1-C4 with associated metallic susceptibility artifact. Chronic marrow replacing lesion within the clivus. No new marrow replacing bone lesions. No fracture. No evidence of discitis. Cord: No convincing cervical cord signal abnormality. Posterior Fossa, vertebral arteries, paraspinal tissues: Negative. Disc levels: C2-C3: Prior decompression. No significant disc protrusion, foraminal stenosis, or canal stenosis. C3-C4: Prior decompression. No significant disc protrusion, foraminal stenosis, or canal stenosis. C4-C5: Retrolisthesis with disc osteophyte complex. Mild canal stenosis and moderate-severe bilateral foraminal stenosis. Appearance similar to prior. C5-C6: Disc osteophyte complex with right greater than left facet  and uncovertebral arthropathy. Moderate canal stenosis with moderate-severe right and mild left foraminal stenosis. No significant interval progression. C6-C7: Disc osteophyte complex with right-sided facet arthropathy. No canal stenosis. Mild bilateral foraminal stenosis. No significant interval progression. C7-T1: Degenerative facet arthropathy without foraminal or canal stenosis. IMPRESSION: 1. Multilevel cervical spondylosis, not significantly progressed from prior. 2. Moderate canal stenosis at C5-6 with moderate-severe right and mild left foraminal stenosis. 3. Mild canal stenosis and moderate-severe bilateral foraminal stenosis at C4-5. 4. 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 No new marrow replacing bone lesions. Electronically Signed   By: Duanne Guess D.O.   On: 09/15/2023 18:36        Scheduled Meds:  aspirin EC  81 mg Oral Daily   DULoxetine  20 mg Oral Daily   enoxaparin (LOVENOX) injection  40 mg Subcutaneous Q24H   lidocaine  1 patch Transdermal Q24H   melatonin  2.5 mg Oral QHS   multivitamin with minerals  1 tablet Oral Daily   pantoprazole  40 mg Oral Daily   QUEtiapine  50 mg Oral BID   Continuous Infusions:  ampicillin-sulbactam (UNASYN) IV Stopped (09/17/23 0544)     LOS: 3 days     Silvano Bilis, MD [facility]   If 7PM-7AM, please contact night-coverage [email or web address] Password [facility] 09/17/2023, 9:33 AM

## 2023-09-17 NOTE — TOC Progression Note (Addendum)
Transition of Care [facility]) - Progression Note    Patient Details  Name: Amy [name] MRN: [id number] Date of Birth: 12/18/1929  [facility]) CM/SW Contact  Darolyn Rua, [place] Phone Number: 09/17/2023, 9:42 AM  Clinical Narrative:     Update 1:22 pm: CSW lvm with Velna Hatchet requesting virtual assessment for patient return, no response at this time. TOC supervisor made aware   Update 10:30 am: Velna Hatchet reports she is not able to come to hospital today to do bedside assessment until 3:30/4, CSW reminded her  updated clinicals were sent yesterday in hopes they could complete the assessment yesterday. She reports earliest patient could return to [facility] if approved would be tomorrow 9/26 once she completes the assessment. CSW has informed TOC supervisor.   9:41 am: CSW attempted to call [facility] no answer and unable to leave vm. Will continue to try to assist with patient dc planning. Updated requested clinicals were sent to [facility] yesterday 9/24.          Expected Discharge Plan and Services                                               Social Determinants of Health (SDOH) Interventions SDOH Screenings   Food Insecurity: No Food Insecurity (09/09/2023)  Housing: Low Risk  (09/09/2023)  Transportation Needs: No Transportation Needs (09/09/2023)  Utilities: Not At Risk (09/09/2023)  Tobacco Use: Low Risk  (09/14/2023)    Readmission Risk Interventions     No data to display

## 2023-09-17 NOTE — Progress Notes (Signed)
Provided IS to daughter and gave instructions on how to use. Daughter states they have "done one of these before." Patient currently sleeping.

## 2023-09-17 NOTE — Progress Notes (Signed)
Physical Therapy Treatment Patient Details Name: Amy [name] MRN: [id number] DOB: 03/28/1929 Today's Date: 09/17/2023   History of Present Illness Amy [name] is a 89 year old female with history of HTN, HLD, DVT, plasmacytoma not on treatment presenting to the emergency department for evaluation of weakness and cough.    PT Comments  Pt sleeping upon PT entrance but when spoken to/prompted does wake and participate. Family at bedside throughout and assisting with provided motivation/encouragement for pt as well. She was able to perform bed mobility minA with cues for sequencing and to maximize independence. Stand pivot to recliner with modA, and several sit <> Stands from recliner, RW and modA. She was able to take 1-2 steps forward as well (twice, with seated rest break between bouts), minA and constant encouragement due to fear of falling. The patient would benefit from further skilled PT intervention to continue to progress towards goals.     If plan is discharge home, recommend the following: A lot of help with walking and/or transfers;Help with stairs or ramp for entrance;Assist for transportation;Supervision due to cognitive status;Assistance with cooking/housework;A little help with bathing/dressing/bathroom   Can travel by private vehicle     No  Equipment Recommendations  Other (comment) (TBD)    Recommendations for Other Services       Precautions / Restrictions Precautions Precautions: Fall Precaution Comments: R foot drop Required Braces or Orthoses: Other Brace Other Brace: soft AFO (brace) for R LE per chart, not in room with pt Restrictions Weight Bearing Restrictions: No     Mobility  Bed Mobility Overal bed mobility: Needs Assistance Bed Mobility: Supine to Sit   Sidelying to sit: Min assist, HOB elevated, Used rails       General bed mobility comments: multi modal vcs for participation    Transfers Overall transfer level: Needs  assistance Equipment used: 1 person hand held assist Transfers: Sit to/from Stand, Bed to chair/wheelchair/BSC Sit to Stand: Mod assist Stand pivot transfers: Mod assist         General transfer comment: handheld assist to pivot to recliner, modA from recliner x 3 reps modA and cues for sequencing each attempt. pt very fearful    Ambulation/Gait Ambulation/Gait assistance: Min assist Gait Distance (Feet): 2 Feet           General Gait Details: pt fearful but able to take 1-2 steps forward twice (seated rest break between) but required significant encouragement and minA   Stairs             Wheelchair Mobility     Tilt Bed    Modified Rankin (Stroke Patients Only)       Balance                                            Cognition Arousal: Alert Behavior During Therapy: WFL for tasks assessed/performed                                   General Comments: oriented to self and place, able to provide some PLOF. does close eyes and appear to be sleeping when not directly spoken too        Exercises      General Comments        Pertinent Vitals/Pain Pain Assessment Pain Assessment: Faces Faces Pain  Scale: Hurts a little bit Pain Location: Neck Pain Descriptors / Indicators: Discomfort, Grimacing, Aching Pain Intervention(s): Monitored during session, Repositioned    Home Living                          Prior Function            PT Goals (current goals can now be found in the care plan section) Progress towards PT goals: Progressing toward goals    Frequency    Min 1X/week      PT Plan      Co-evaluation              AM-PAC PT "6 Clicks" Mobility   Outcome Measure  Help needed turning from your back to your side while in a flat bed without using bedrails?: A Lot Help needed moving from lying on your back to sitting on the side of a flat bed without using bedrails?: A Lot Help needed  moving to and from a bed to a chair (including a wheelchair)?: A Lot Help needed standing up from a chair using your arms (e.g., wheelchair or bedside chair)?: A Lot Help needed to walk in hospital room?: A Lot Help needed climbing 3-5 steps with a railing? : Total 6 Click Score: 11    End of Session Equipment Utilized During Treatment: Gait belt Activity Tolerance: Patient tolerated treatment well Patient left: in chair;with call bell/phone within reach;with chair alarm set;with family/visitor present Nurse Communication: Mobility status PT Visit Diagnosis: Unsteadiness on feet (R26.81);Repeated falls (R29.6);Muscle weakness (generalized) (M62.81);History of falling (Z91.81);Pain Pain - Right/Left:  (bilateral) Pain - part of body:  (back/neck)     Time: 9562-1308 PT Time Calculation (min) (ACUTE ONLY): 25 min  Charges:    $Therapeutic Activity: 23-37 mins PT General Charges $$ ACUTE PT VISIT: 1 Visit                     Olga Coaster PT, DPT 11:46 AM,09/17/23

## 2023-09-18 DIAGNOSIS — R0602 Shortness of breath: Secondary | ICD-10-CM | POA: Diagnosis not present

## 2023-09-18 LAB — BASIC METABOLIC PANEL
Anion gap: 6 (ref 5–15)
BUN: 16 mg/dL (ref 8–23)
CO2: 25 mmol/L (ref 22–32)
Calcium: 8.3 mg/dL — ABNORMAL LOW (ref 8.9–10.3)
Chloride: 106 mmol/L (ref 98–111)
Creatinine, Ser: 0.57 mg/dL (ref 0.44–1.00)
GFR, Estimated: >60 mL/min (ref >60)
Glucose, Bld: 107 mg/dL — ABNORMAL HIGH (ref 70–99)
Potassium: 4.1 mmol/L (ref 3.5–5.1)
Sodium: 137 mmol/L (ref 135–145)

## 2023-09-18 LAB — CULTURE, BLOOD (ROUTINE X 2)
Culture: NO GROWTH
Culture: NO GROWTH

## 2023-09-18 NOTE — Progress Notes (Signed)
PROGRESS NOTE    Amy [name]  KZS:[id number] DOB: Nov 30, 1929 DOA: 09/14/2023 PCP: Danella Penton, MD     Brief Narrative:    [name] is a 89 year old female with history of GERD, hypertension, depression, anxiety, CAD, who presents to the emergency department for chief concerns of worsening shortness of breath and cough.  At bedside, patient patient was able to tell me her name, age, location, current calendar year.   Patient is eating dinner and she states that the piece taste good.  She states she feels better here than she did at the facility.   Patient reports that she was short of breath and had increased cough.  She denies trauma to her person.  She reports that her bilateral shoulder joints hurt her and this happens sometimes.   Granddaughter at bedside states that she was endorsing some thoracic discomfort.  Patient states that this comfort is surrounding the area where she had the compression fracture with a cement fix.   Patient states she forgot all about that.     Patient is requesting to not go back to that facility.  She endorses generalized weakness.  She also endorses swelling of her lower extremity that is her baseline.  She reports that the swelling and the pain gets worse when she lays in bed all day.     She reports that the facility was not nice to her and did not take good care of her.  They refused to turn off the TV stating that he was not their job. She reports that the facility refused to let her get out of bed and states that she has to stay in bed all day.   Assessment & Plan:   Principal Problem:   Shortness of breath Active Problems:   Atelectasis, bilateral   CAD in native artery   Benign essential HTN   Mixed hyperlipidemia   Hx of deep vein thrombophlebitis of lower extremity   Leukocytosis   History of vertebral compression fracture   Weakness   Depression with anxiety  # Cough, possible aspiration pneumonia Likely sequelae of  recent pneumonia.  CTA no signs of PE. Does have bibasilar atelectasis/infiltrate that is likely contributing. No pulm edema to suggest overt chf but bnp is mildly elevated and there are small b/l pleural effusions. Troponins are negative. Wbc elevated but was on steroids at home. Covid negative. Rvp last week negative. Procal neg but does have productive cough so have elected to treat for aspiration pneumonia given aspiration risk - cont unasyn - incentive spirometer, flutter valve  # Hiatal hernia # Dysphagia Likely contributes to reflux and as such risk for aspiration - slp has cleared for dysphagia 3 diet  # Hypoxic respiratory failure Requiring 1-2 liters here, 2/2 atelectasis, poss pneumonia as above - [place] O2, wean as able  # T11/12 compression fractures # Cervical stenosis S/p kyphoplasty in 2019, patient's main complaint is cervical/thoracic neck pain. Mri c/t spine stable from priors - lidocaine patch, duloxetine  # HTN BPs wnl today - holding home dilt - resumed home metop  # CAD No chest pain, normal trops - cont home asa   # Dementia - cont home seroquel  # Debility - SNF bed search underway    DVT prophylaxis: lovenox Code Status: dnr Family Communication: daughter updated @ bedside 9/26  Level of care: Med-Surg Status is: Inpatient Remains inpatient appropriate because: no SNF bed yet identified    Consultants:  none  Procedures: none  Antimicrobials:  unasyn    Subjective: Back pain improved, tolerating diet, up to chair  Objective: Vitals:   09/17/23 2011 09/17/23 2319 09/18/23 0348 09/18/23 0850  BP: (!) 135/58 (!) 140/60 (!) 130/53 (!) 149/54  Pulse: 70 73 66 69  Resp: 18 16 18  (!) 21  Temp: 98.4 F (36.9 C) 98 F (36.7 C) 98.5 F (36.9 C) (!) 97.4 F (36.3 C)  TempSrc:  Oral Oral   SpO2: 96% 94% 94% 95%  Weight:        Intake/Output Summary (Last 24 hours) at 09/18/2023 1258 Last data filed at 09/18/2023 0352 Gross per 24  hour  Intake 240 ml  Output 650 ml  Net -410 ml   Filed Weights   09/16/23 0345 09/17/23 0345  Weight: 65.6 kg 65.5 kg    Examination:  General exam: NAD, chronically ill appearing Respiratory system: rales at bases, scattered rhonchi. Improving. Cardiovascular system: S1 & S2 heard, RRR. No JVD, murmurs, rubs, gallops or clicks. No pedal edema. Gastrointestinal system: Abdomen is nondistended, soft and nontender. No organomegaly or masses felt. Normal bowel sounds heard. Central nervous system: Alert, moving all 4 Extremities: warm Skin: No rashes, lesions or ulcers Psychiatry: calm    Data Reviewed: I have personally reviewed following labs and imaging studies  CBC: Recent Labs  Lab 09/14/23 1133 09/15/23 0349  WBC 21.6* 14.1*  NEUTROABS 18.2*  --   HGB 12.6 11.4*  HCT 40.8 35.7*  MCV 91.9 88.8  PLT 396 358   Basic Metabolic Panel: Recent Labs  Lab 09/14/23 1133 09/15/23 0349 09/16/23 0937 09/17/23 0609 09/18/23 0337  NA 135 139 135 139 137  K 4.3 4.4 4.1 3.7 4.1  CL 100 106 102 107 106  CO2 25 25 23 25 25   GLUCOSE 106* 89 130* 112* 107*  BUN 19 16 15 13 16   CREATININE 0.69 0.55 0.60 0.58 0.57  CALCIUM 8.9 8.7* 8.6* 8.1* 8.3*   GFR: Estimated Creatinine Clearance: 40.4 mL/min (by C-G formula based on SCr of 0.57 mg/dL). Liver Function Tests: Recent Labs  Lab 09/14/23 1133  AST 40  ALT 37  ALKPHOS 101  BILITOT 0.6  PROT 6.4*  ALBUMIN 2.8*   No results for input(s): "LIPASE", "AMYLASE" in the last 168 hours. No results for input(s): "AMMONIA" in the last 168 hours. Coagulation Profile: No results for input(s): "INR", "PROTIME" in the last 168 hours.  Cardiac Enzymes: No results for input(s): "CKTOTAL", "CKMB", "CKMBINDEX", "TROPONINI" in the last 168 hours. BNP (last 3 results) No results for input(s): "PROBNP" in the last 8760 hours. HbA1C: No results for input(s): "HGBA1C" in the last 72 hours. CBG: No results for input(s): "GLUCAP" in  the last 168 hours.  Lipid Profile: No results for input(s): "CHOL", "HDL", "LDLCALC", "TRIG", "CHOLHDL", "LDLDIRECT" in the last 72 hours. Thyroid Function Tests: No results for input(s): "TSH", "T4TOTAL", "FREET4", "T3FREE", "THYROIDAB" in the last 72 hours. Anemia Panel: No results for input(s): "VITAMINB12", "FOLATE", "FERRITIN", "TIBC", "IRON", "RETICCTPCT" in the last 72 hours. Urine analysis:    Component Value Date/Time   COLORURINE STRAW (A) 09/14/2023 1758   APPEARANCEUR HAZY (A) 09/14/2023 1758   LABSPEC 1.015 09/14/2023 1758   PHURINE 7.0 09/14/2023 1758   GLUCOSEU NEGATIVE 09/14/2023 1758   HGBUR NEGATIVE 09/14/2023 1758   BILIRUBINUR NEGATIVE 09/14/2023 1758   BILIRUBINUR neg 05/13/2016 1055   KETONESUR NEGATIVE 09/14/2023 1758   PROTEINUR NEGATIVE 09/14/2023 1758   UROBILINOGEN 0.2 05/13/2016 1055   NITRITE NEGATIVE 09/14/2023 1758  LEUKOCYTESUR LARGE (A) 09/14/2023 1758   Sepsis Labs: @LABRCNTIP (procalcitonin:4,lacticidven:4)  ) Recent Results (from the past 240 hour(s))  Culture, blood (Routine x 2)     Status: None   Collection Time: 09/08/23  8:34 PM   Specimen: BLOOD  Result Value Ref Range Status   Specimen Description BLOOD BLOOD RIGHT HAND  Final   Special Requests   Final    BOTTLES DRAWN AEROBIC AND ANAEROBIC Blood Culture adequate volume   Culture   Final    NO GROWTH 5 DAYS Performed at [facility], [address] [place], [place] [address]    Report Status 09/13/2023 FINAL  Final  Culture, blood (Routine x 2)     Status: None   Collection Time: 09/08/23  8:34 PM   Specimen: BLOOD  Result Value Ref Range Status   Specimen Description BLOOD BLOOD RIGHT ARM  Final   Special Requests   Final    BOTTLES DRAWN AEROBIC AND ANAEROBIC Blood Culture results may not be optimal due to an excessive volume of blood received in culture bottles   Culture   Final    NO GROWTH 5 DAYS Performed at [facility], [address]  [place], [place] [address]    Report Status 09/13/2023 FINAL  Final  Resp panel by RT-PCR (RSV, Flu A&B, Covid) Anterior Nasal Swab     Status: None   Collection Time: 09/08/23  9:37 PM   Specimen: Anterior Nasal Swab  Result Value Ref Range Status   SARS Coronavirus 2 by RT PCR NEGATIVE NEGATIVE Final    Comment: (NOTE) SARS-CoV-2 target nucleic acids are NOT DETECTED.  The SARS-CoV-2 RNA is generally detectable in upper respiratory specimens during the acute phase of infection. The lowest concentration of SARS-CoV-2 viral copies this assay can detect is 138 copies/mL. A negative result does not preclude SARS-Cov-2 infection and should not be used as the sole basis for treatment or other patient management decisions. A negative result may occur with  improper specimen collection/handling, submission of specimen other than nasopharyngeal swab, presence of viral mutation(s) within the areas targeted by this assay, and inadequate number of viral copies(<138 copies/mL). A negative result must be combined with clinical observations, patient history, and epidemiological information. The expected result is Negative.  Fact Sheet for Patients:  BloggerCourse.com  Fact Sheet for Healthcare Providers:  SeriousBroker.it  This test is no t yet approved or cleared by the [place] FDA and  has been authorized for detection and/or diagnosis of SARS-CoV-2 by FDA under an Emergency Use Authorization (EUA). This EUA will remain  in effect (meaning this test can be used) for the duration of the COVID-19 declaration under Section 564(b)(1) of the Act, 21 U.S.C.section 360bbb-3(b)(1), unless the authorization is terminated  or revoked sooner.       Influenza A by PCR NEGATIVE NEGATIVE Final   Influenza B by PCR NEGATIVE NEGATIVE Final    Comment: (NOTE) The Xpert Xpress SARS-CoV-2/FLU/RSV plus assay is intended as an aid in the diagnosis of  influenza from Nasopharyngeal swab specimens and should not be used as a sole basis for treatment. Nasal washings and aspirates are unacceptable for Xpert Xpress SARS-CoV-2/FLU/RSV testing.  Fact Sheet for Patients: BloggerCourse.com  Fact Sheet for Healthcare Providers: SeriousBroker.it  This test is not yet approved or cleared by the [place] FDA and has been authorized for detection and/or diagnosis of SARS-CoV-2 by FDA under an Emergency Use Authorization (EUA). This EUA will remain in effect (meaning this  test can be used) for the duration of the COVID-19 declaration under Section 564(b)(1) of the Act, 21 U.S.C. section 360bbb-3(b)(1), unless the authorization is terminated or revoked.     Resp Syncytial Virus by PCR NEGATIVE NEGATIVE Final    Comment: (NOTE) Fact Sheet for Patients: BloggerCourse.com  Fact Sheet for Healthcare Providers: SeriousBroker.it  This test is not yet approved or cleared by the [place] FDA and has been authorized for detection and/or diagnosis of SARS-CoV-2 by FDA under an Emergency Use Authorization (EUA). This EUA will remain in effect (meaning this test can be used) for the duration of the COVID-19 declaration under Section 564(b)(1) of the Act, 21 U.S.C. section 360bbb-3(b)(1), unless the authorization is terminated or revoked.  Performed at [facility], [address], [place], [place] [address]   Urine Culture     Status: None   Collection Time: 09/09/23  5:27 AM   Specimen: Urine, Random  Result Value Ref Range Status   Specimen Description   Final    URINE, RANDOM Performed at [facility], [address], [place], [place] [address]    Special Requests   Final    NONE Reflexed from [id number] Performed at [facility], [address] [place], [place] [address]    Culture   Final    NO  GROWTH Performed at [facility], [address] [place], [place] [address]    Report Status 09/10/2023 FINAL  Final  MRSA Next Gen by PCR, Nasal     Status: None   Collection Time: 09/09/23  1:51 PM   Specimen: Nasal Mucosa; Nasal Swab  Result Value Ref Range Status   MRSA by PCR Next Gen NOT DETECTED NOT DETECTED Final    Comment: (NOTE) The GeneXpert MRSA Assay (FDA approved for NASAL specimens only), is one component of a comprehensive MRSA colonization surveillance program. It is not intended to diagnose MRSA infection nor to guide or monitor treatment for MRSA infections. Test performance is not FDA approved in patients less than 68 years old. Performed at [facility], [address] [place], [place] [address]   Respiratory (~20 pathogens) panel by PCR     Status: None   Collection Time: 09/09/23  1:55 PM  Result Value Ref Range Status   Adenovirus NOT DETECTED NOT DETECTED Final   Coronavirus 229E NOT DETECTED NOT DETECTED Final    Comment: (NOTE) The Coronavirus on the Respiratory Panel, DOES NOT test for the novel  Coronavirus (2019 nCoV)    Coronavirus HKU1 NOT DETECTED NOT DETECTED Final   Coronavirus NL63 NOT DETECTED NOT DETECTED Final   Coronavirus OC43 NOT DETECTED NOT DETECTED Final   Metapneumovirus NOT DETECTED NOT DETECTED Final   Rhinovirus / Enterovirus NOT DETECTED NOT DETECTED Final   Influenza A NOT DETECTED NOT DETECTED Final   Influenza B NOT DETECTED NOT DETECTED Final   Parainfluenza Virus 1 NOT DETECTED NOT DETECTED Final   Parainfluenza Virus 2 NOT DETECTED NOT DETECTED Final   Parainfluenza Virus 3 NOT DETECTED NOT DETECTED Final   Parainfluenza Virus 4 NOT DETECTED NOT DETECTED Final   Respiratory Syncytial Virus NOT DETECTED NOT DETECTED Final   Bordetella pertussis NOT DETECTED NOT DETECTED Final   Bordetella Parapertussis NOT DETECTED NOT DETECTED Final   Chlamydophila pneumoniae NOT DETECTED NOT DETECTED Final    Mycoplasma pneumoniae NOT DETECTED NOT DETECTED Final    Comment: Performed at [facility], [address] [place], [place] [address]  Resp panel by RT-PCR (RSV, Flu A&B, Covid) Anterior Nasal Swab     Status: None   Collection Time: 09/14/23 11:34 AM   Specimen: Anterior Nasal Swab  Result Value Ref Range Status   SARS Coronavirus 2 by RT PCR NEGATIVE NEGATIVE Final    Comment: (NOTE) SARS-CoV-2 target nucleic acids are NOT DETECTED.  The SARS-CoV-2 RNA is generally detectable in upper respiratory specimens during the acute phase of infection. The lowest concentration of SARS-CoV-2 viral copies this assay can detect is 138 copies/mL. A negative result does not preclude SARS-Cov-2 infection and should not be used as the sole basis for treatment or other patient management decisions. A negative result may occur with  improper specimen collection/handling, submission of specimen other than nasopharyngeal swab, presence of viral mutation(s) within the areas targeted by this assay, and inadequate number of viral copies(<138 copies/mL). A negative result must be combined with clinical observations, patient history, and epidemiological information. The expected result is Negative.  Fact Sheet for Patients:  BloggerCourse.com  Fact Sheet for Healthcare Providers:  SeriousBroker.it  This test is no t yet approved or cleared by the [place] FDA and  has been authorized for detection and/or diagnosis of SARS-CoV-2 by FDA under an Emergency Use Authorization (EUA). This EUA will remain  in effect (meaning this test can be used) for the duration of the COVID-19 declaration under Section 564(b)(1) of the Act, 21 U.S.C.section 360bbb-3(b)(1), unless the authorization is terminated  or revoked sooner.       Influenza A by PCR NEGATIVE NEGATIVE Final   Influenza B by PCR NEGATIVE NEGATIVE Final    Comment: (NOTE) The Xpert Xpress  SARS-CoV-2/FLU/RSV plus assay is intended as an aid in the diagnosis of influenza from Nasopharyngeal swab specimens and should not be used as a sole basis for treatment. Nasal washings and aspirates are unacceptable for Xpert Xpress SARS-CoV-2/FLU/RSV testing.  Fact Sheet for Patients: BloggerCourse.com  Fact Sheet for Healthcare Providers: SeriousBroker.it  This test is not yet approved or cleared by the [place] FDA and has been authorized for detection and/or diagnosis of SARS-CoV-2 by FDA under an Emergency Use Authorization (EUA). This EUA will remain in effect (meaning this test can be used) for the duration of the COVID-19 declaration under Section 564(b)(1) of the Act, 21 U.S.C. section 360bbb-3(b)(1), unless the authorization is terminated or revoked.     Resp Syncytial Virus by PCR NEGATIVE NEGATIVE Final    Comment: (NOTE) Fact Sheet for Patients: BloggerCourse.com  Fact Sheet for Healthcare Providers: SeriousBroker.it  This test is not yet approved or cleared by the [place] FDA and has been authorized for detection and/or diagnosis of SARS-CoV-2 by FDA under an Emergency Use Authorization (EUA). This EUA will remain in effect (meaning this test can be used) for the duration of the COVID-19 declaration under Section 564(b)(1) of the Act, 21 U.S.C. section 360bbb-3(b)(1), unless the authorization is terminated or revoked.  Performed at [facility], [address] [place], [place] [address]   Culture, blood (routine x 2)     Status: None (Preliminary result)   Collection Time: 09/14/23 12:08 PM   Specimen: BLOOD  Result Value Ref Range Status   Specimen Description BLOOD LEFT ANTECUBITAL  Final   Special Requests   Final    BOTTLES DRAWN AEROBIC AND ANAEROBIC Blood Culture results may not be optimal due to an inadequate volume of blood  received in culture bottles   Culture   Final  NO GROWTH 4 DAYS Performed at [facility], [address] [place], [place] [address]    Report Status PENDING  Incomplete  Culture, blood (routine x 2)     Status: None (Preliminary result)   Collection Time: 09/14/23 12:13 PM   Specimen: BLOOD  Result Value Ref Range Status   Specimen Description BLOOD BLOOD LEFT WRIST  Final   Special Requests   Final    BOTTLES DRAWN AEROBIC AND ANAEROBIC Blood Culture results may not be optimal due to an inadequate volume of blood received in culture bottles   Culture   Final    NO GROWTH 4 DAYS Performed at [facility], [address], [place], [place] [address]    Report Status PENDING  Incomplete         Radiology Studies: No results found.      Scheduled Meds:  aspirin EC  81 mg Oral Daily   DULoxetine  20 mg Oral Daily   enoxaparin (LOVENOX) injection  40 mg Subcutaneous Q24H   lidocaine  1 patch Transdermal Q24H   melatonin  2.5 mg Oral QHS   metoprolol succinate  25 mg Oral Daily   multivitamin with minerals  1 tablet Oral Daily   pantoprazole  40 mg Oral Daily   polyethylene glycol  17 g Oral Daily   QUEtiapine  50 mg Oral BID   senna-docusate  1 tablet Oral QHS   Continuous Infusions:  ampicillin-sulbactam (UNASYN) IV 3 g (09/18/23 0513)     LOS: 4 days     Silvano Bilis, MD [facility]   If 7PM-7AM, please contact night-coverage [email or web address] Password [facility] 09/18/2023, 12:58 PM

## 2023-09-18 NOTE — Plan of Care (Signed)
  Problem: Fluid Volume: Goal: Hemodynamic stability will improve Outcome: Progressing   Problem: Education: Goal: Knowledge of General Education information will improve Description: Including pain rating scale, medication(s)/side effects and non-pharmacologic comfort measures Outcome: Progressing   Problem: Clinical Measurements: Goal: Will remain free from infection Outcome: Progressing

## 2023-09-18 NOTE — Care Management Important Message (Signed)
Important Message  Patient Details  Name: Amy [name] MRN: [id number] Date of Birth: 06-20-1929   Important Message Given:  Yes - Medicare IM     Johnell Comings 09/18/2023, 1:09 PM

## 2023-09-18 NOTE — Progress Notes (Signed)
Physical Therapy Treatment Patient Details Name: Amy [name] MRN: [id number] DOB: [birth year] Today's Date: 09/18/2023   History of Present Illness Amy [name] is a 89 year old female with history of HTN, HLD, DVT, plasmacytoma not on treatment presenting to the emergency department for evaluation of weakness and cough.    PT Comments  Patient alert, with RN/tech in room, washing up. PT assisted with mobility needs; sit <> stand minA with RW first attempt, with second attempt modA and able to step pivot to EOB with constant verbal encouragement. Once seated EOB she was able to perform several seated exercises as well. ModA to return to supine, needs in reach at end of session. The patient would benefit from further skilled PT intervention to continue to progress towards goals. Recommendation remains appropriate.      If plan is discharge home, recommend the following: A lot of help with walking and/or transfers;Help with stairs or ramp for entrance;Assist for transportation;Supervision due to cognitive status;Assistance with cooking/housework;A little help with bathing/dressing/bathroom   Can travel by private vehicle     No  Equipment Recommendations  Other (comment) (TBD)    Recommendations for Other Services       Precautions / Restrictions Precautions Precautions: Fall Precaution Comments: R foot drop Required Braces or Orthoses: Other Brace Other Brace: soft AFO (brace) for R LE per chart, not in room with pt Restrictions Weight Bearing Restrictions: No     Mobility  Bed Mobility Overal bed mobility: Needs Assistance Bed Mobility: Sit to Supine       Sit to supine: Mod assist        Transfers Overall transfer level: Needs assistance Equipment used: Rolling walker (2 wheels), 1 person hand held assist Transfers: Sit to/from Stand Sit to Stand: Min assist, Mod assist   Step pivot transfers: Mod assist       General transfer comment: from recliner  minA, second attempt modA, and step pivot to EOB modA    Ambulation/Gait                   Stairs             Wheelchair Mobility     Tilt Bed    Modified Rankin (Stroke Patients Only)       Balance Overall balance assessment: History of Falls, Needs assistance Sitting-balance support: Feet supported, Single extremity supported, No upper extremity supported Sitting balance-Leahy Scale: Good       Standing balance-Leahy Scale: Poor                              Cognition Arousal: Alert Behavior During Therapy: WFL for tasks assessed/performed                                   General Comments: oriented x4, mild increased time to follow commands        Exercises Other Exercises Other Exercises: seated LAQ, marches, heel/toes raises x10 bilaterally    General Comments        Pertinent Vitals/Pain Pain Assessment Pain Assessment: No/denies pain    Home Living                          Prior Function            PT Goals (current goals can now be found  in the care plan section) Progress towards PT goals: Progressing toward goals    Frequency    Min 1X/week      PT Plan      Co-evaluation              AM-PAC PT "6 Clicks" Mobility   Outcome Measure  Help needed turning from your back to your side while in a flat bed without using bedrails?: A Lot Help needed moving from lying on your back to sitting on the side of a flat bed without using bedrails?: A Lot Help needed moving to and from a bed to a chair (including a wheelchair)?: A Lot Help needed standing up from a chair using your arms (e.g., wheelchair or bedside chair)?: A Lot Help needed to walk in hospital room?: A Lot Help needed climbing 3-5 steps with a railing? : Total 6 Click Score: 11    End of Session Equipment Utilized During Treatment: Gait belt Activity Tolerance: Patient tolerated treatment well Patient left: in  chair;with call bell/phone within reach;with chair alarm set;with family/visitor present Nurse Communication: Mobility status PT Visit Diagnosis: Unsteadiness on feet (R26.81);Repeated falls (R29.6);Muscle weakness (generalized) (M62.81);History of falling (Z91.81);Pain Pain - Right/Left:  (bilateral) Pain - part of body:  (back/neck)     Time: 4010-2725 PT Time Calculation (min) (ACUTE ONLY): 15 min  Charges:    $Therapeutic Activity: 8-22 mins PT General Charges $$ ACUTE PT VISIT: 1 Visit                     Olga Coaster PT, DPT 4:02 PM,09/18/23

## 2023-09-18 NOTE — TOC Progression Note (Addendum)
Transition of Care [facility]) - Progression Note    Patient Details  Name: Amy [name] MRN: [id number] Date of Birth: 02-25-1929  Transition of Care [facility]) CM/SW Contact  Darolyn Rua, [place] Phone Number: 09/18/2023, 8:36 AM  Clinical Narrative:     Update 11:00 am: Velna Hatchet with [facility] called to report they are unable to accept patient back , will need STR prior to return. Family does not want patient to return to [facility], would like to look at other SNF options. Referrals sent out pending bed offers.    CSW spoke with Velna Hatchet at [facility] who reports her team will determine decision today if patient can return, as she did complete her assessment yesterday.   She requests last 4 days of clinicals be faxed to [phone] , CSW has faxed.   Daughter Dois Davenport updated on above.       Expected Discharge Plan and Services                                               Social Determinants of Health (SDOH) Interventions SDOH Screenings   Food Insecurity: No Food Insecurity (09/09/2023)  Housing: Low Risk  (09/09/2023)  Transportation Needs: No Transportation Needs (09/09/2023)  Utilities: Not At Risk (09/09/2023)  Tobacco Use: Low Risk  (09/14/2023)    Readmission Risk Interventions     No data to display

## 2023-09-18 NOTE — Progress Notes (Signed)
Occupational Therapy Treatment Patient Details Name: Amy [name] MRN: [id number] DOB: [birth year] Today's Date: 09/18/2023   History of present illness Amy [name] is a 89 year old female with history of HTN, HLD, DVT, plasmacytoma not on treatment presenting to the emergency department for evaluation of weakness and cough.   OT comments  Chart reviewed to date, pt greeted in room with daughter present, alert, oriented to self, place, month/year, grossly to situation. Improvements in simple step direction following noted with mildly increased time required for one step directions on this date. Tx session targeted improving functional activity tolerance for improved ADL participation. Improvements noted in STS with pt requiring MIN A with RW one one attempt, four attempts with MOD A with/without RW, verbal cues for technique. Improvements also noted with pt performing feeding with supervision. Pt is making slow progress towards goals, discharge recommendation remains appropriate.       If plan is discharge home, recommend the following:  A lot of help with walking and/or transfers;A lot of help with bathing/dressing/bathroom;Assistance with cooking/housework;Direct supervision/assist for medications management;Direct supervision/assist for financial management;Help with stairs or ramp for entrance;Assist for transportation   Equipment Recommendations  None recommended by OT    Recommendations for Other Services      Precautions / Restrictions Precautions Precautions: Fall Precaution Comments: R foot drop Required Braces or Orthoses: Other Brace Other Brace: soft AFO (brace) for R LE per chart, not in room with pt Restrictions Weight Bearing Restrictions: No       Mobility Bed Mobility Overal bed mobility: Needs Assistance Bed Mobility: Supine to Sit     Supine to sit: Min assist, HOB elevated, Used rails          Transfers Overall transfer level: Needs  assistance Equipment used: Rolling walker (2 wheels), 1 person hand held assist Transfers: Sit to/from Stand Sit to Stand: Min assist, Mod assist (MOD A for 3 STS from bedside chair without AD for IV line adjustment, MIN A for STS from chair with RW; frequent vcs for technique)                 Balance Overall balance assessment: History of Falls, Needs assistance Sitting-balance support: Feet supported, Single extremity supported, No upper extremity supported Sitting balance-Leahy Scale: Good     Standing balance support: Bilateral upper extremity supported, During functional activity, Reliant on assistive device for balance Standing balance-Leahy Scale: Poor                             ADL either performed or assessed with clinical judgement   ADL Overall ADL's : Needs assistance/impaired Eating/Feeding: Supervision/ safety;Sitting                   Lower Body Dressing: Minimal assistance;Sit to/from stand Lower Body Dressing Details (indicate cue type and reason): slip on sketchers sneakers, anticipate MAX A for socks Toilet Transfer: Moderate assistance;Cueing for safety;Cueing for sequencing Toilet Transfer Details (indicate cue type and reason): step pivot, simulated to bedside chair Toileting- Clothing Manipulation and Hygiene: Maximal assistance Toileting - Clothing Manipulation Details (indicate cue type and reason): anticipate     Functional mobility during ADLs: Moderate assistance;Cueing for safety;Cueing for sequencing (step pivot transfer)      Extremity/Trunk Assessment              Vision       Perception     Praxis      Cognition  Arousal: Alert Behavior During Therapy: WFL for tasks assessed/performed Overall Cognitive Status: Impaired/Different from baseline Area of Impairment: Problem solving                       Following Commands: Follows one step commands with increased time     Problem Solving: Slow  processing, Requires verbal cues, Requires tactile cues General Comments: oriented x4, mild increased time to follow commands        Exercises Other Exercises Other Exercises: edu pt and daughter re: role of OT, assistance level required for functional tasks on this date    Shoulder Instructions       General Comments pre session: BP 127/58, spo2 91% on 1 L via [place], HR 84 bpm; post session: BP 132/66 (MAP 84) spo2 95% on 1 L via [place], HR 85 bpm    Pertinent Vitals/ Pain       Pain Assessment Pain Assessment: No/denies pain  Home Living                                          Prior Functioning/Environment              Frequency  Min 1X/week        Progress Toward Goals  OT Goals(current goals can now be found in the care plan section)  Progress towards OT goals: Progressing toward goals     Plan      Co-evaluation                 AM-PAC OT "6 Clicks" Daily Activity     Outcome Measure   Help from another person eating meals?: A Little Help from another person taking care of personal grooming?: A Little Help from another person toileting, which includes using toliet, bedpan, or urinal?: A Lot Help from another person bathing (including washing, rinsing, drying)?: A Lot Help from another person to put on and taking off regular upper body clothing?: A Little Help from another person to put on and taking off regular lower body clothing?: A Lot 6 Click Score: 15    End of Session Equipment Utilized During Treatment: Oxygen;Rolling walker (2 wheels)  OT Visit Diagnosis: Unsteadiness on feet (R26.81);History of falling (Z91.81);Muscle weakness (generalized) (M62.81)   Activity Tolerance Patient tolerated treatment well   Patient Left in chair;with call bell/phone within reach;with chair alarm set;with family/visitor present;with nursing/sitter in room   Nurse Communication Mobility status        Time: 7564-3329 OT Time Calculation  (min): 18 min  Charges: OT General Charges $OT Visit: 1 Visit OT Treatments $Self Care/Home Management : 8-22 mins  Oleta Mouse, OTD OTR/L  09/18/23, 10:28 AM

## 2023-09-19 DIAGNOSIS — R0602 Shortness of breath: Secondary | ICD-10-CM | POA: Diagnosis not present

## 2023-09-19 LAB — BASIC METABOLIC PANEL
Anion gap: 9 (ref 5–15)
BUN: 15 mg/dL (ref 8–23)
CO2: 28 mmol/L (ref 22–32)
Calcium: 8.6 mg/dL — ABNORMAL LOW (ref 8.9–10.3)
Chloride: 104 mmol/L (ref 98–111)
Creatinine, Ser: 0.52 mg/dL (ref 0.44–1.00)
GFR, Estimated: >60 mL/min (ref >60)
Glucose, Bld: 92 mg/dL (ref 70–99)
Potassium: 4.3 mmol/L (ref 3.5–5.1)
Sodium: 141 mmol/L (ref 135–145)

## 2023-09-19 MED ORDER — LACTULOSE 10 GM/15ML PO SOLN
30.0000 g | Freq: Once | ORAL | Status: AC
  Administered 2023-09-19: 30 g via ORAL
  Filled 2023-09-19: qty 60

## 2023-09-19 NOTE — Plan of Care (Signed)
  Problem: Fluid Volume: Goal: Hemodynamic stability will improve Outcome: Progressing   Problem: Respiratory: Goal: Ability to maintain adequate ventilation will improve Outcome: Progressing   Problem: Clinical Measurements: Goal: Ability to maintain clinical measurements within normal limits will improve Outcome: Progressing   Problem: Activity: Goal: Risk for activity intolerance will decrease Outcome: Progressing

## 2023-09-19 NOTE — TOC Progression Note (Signed)
Transition of Care [facility]) - Progression Note    Patient Details  Name: Amy [name] MRN: [id number] Date of Birth: 1929/11/27  Transition of Care [facility]) CM/SW Contact  Marlowe Sax, RN Phone Number: 09/19/2023, 2:03 PM  Clinical Narrative:    Met with the patient and her daughter in the room, I reviewed the bed offers with the patient and her daughter, they decided to go to [facility], [facility] has went to [facility] and talked with [facility] and resolved a couple complaints that they had from the previous admission they had there [facility] stated that they can not accept the patient over the weekend, they will accept her on Monday    Expected Discharge Plan: Skilled Nursing Facility Barriers to Discharge: SNF Pending bed offer  Expected Discharge Plan and Services   Discharge Planning Services: CM Consult   Living arrangements for the past 2 months: Assisted Living Facility                   DME Agency: NA       HH Arranged: NA           Social Determinants of Health (SDOH) Interventions SDOH Screenings   Food Insecurity: No Food Insecurity (09/09/2023)  Housing: Low Risk  (09/09/2023)  Transportation Needs: No Transportation Needs (09/09/2023)  Utilities: Not At Risk (09/09/2023)  Tobacco Use: Low Risk  (09/14/2023)    Readmission Risk Interventions     No data to display

## 2023-09-19 NOTE — Progress Notes (Signed)
Physical Therapy Treatment Patient Details Name: Amy [name] MRN: [id number] DOB: February 04, 1929 Today's Date: 09/19/2023   History of Present Illness Amy [name] is a 89 year old female with history of HTN, HLD, DVT, plasmacytoma not on treatment presenting to the emergency department for evaluation of weakness and cough.    PT Comments  Pt was long sitting in bed upon arrival. She was asleep but easily awakes and agrees to session with encouragement. Pt has poor insight of her abilities. Does have some anxiety/fear of falling noted during OOB activity. She was able to exit L side of bed, stand  4 x EOB and even advance to taking steps away form EOB. Pt panics (c/o knee pain)  during short gait and hurries back to EOB. Overall pt tolerated session well. She will continue to benefit form skilled PT going forward to maximize her safety functional mobility while decreasing caregiver burden.    If plan is discharge home, recommend the following: A lot of help with walking and/or transfers;A lot of help with bathing/dressing/bathroom;Assistance with cooking/housework;Assistance with feeding;Direct supervision/assist for medications management;Direct supervision/assist for financial management;Assist for transportation;Help with stairs or ramp for entrance;Supervision due to cognitive status     Equipment Recommendations  None recommended by PT (defer to next level of care)       Precautions / Restrictions Precautions Precautions: Fall Precaution Comments: R foot drop Required Braces or Orthoses: Other Brace Other Brace: soft AFO (brace) for R LE per chart, not in room with pt Restrictions Weight Bearing Restrictions: No     Mobility  Bed Mobility Overal bed mobility: Needs Assistance Bed Mobility: Supine to Sit, Sit to Supine  Supine to sit: Min assist, HOB elevated, Used rails Sit to supine: Min assist        Transfers Overall transfer level: Needs assistance Equipment used:  Rolling walker (2 wheels), 1 person hand held assist Transfers: Sit to/from Stand Sit to Stand: Min assist  General transfer comment: pt stood EOB 4 x total    Ambulation/Gait Ambulation/Gait assistance: Editor, commissioning (Feet): 8 Feet Assistive device: Rolling walker (2 wheels) Gait Pattern/deviations: Step-to pattern, Trunk flexed Gait velocity: decreased  General Gait Details: Pt ambulated ~ 8 ft total. distance limited by pt anxiety and c/o pain. Author feels pt would be able to peform much more if more willing     Balance Overall balance assessment: History of Falls, Needs assistance Sitting-balance support: Feet supported, Single extremity supported, No upper extremity supported Sitting balance-Leahy Scale: Good     Standing balance support: Bilateral upper extremity supported, During functional activity, Reliant on assistive device for balance Standing balance-Leahy Scale: Fair Standing balance comment: reliant on at least unilateral UE support      Cognition Arousal: Alert Behavior During Therapy: WFL for tasks assessed/performed Overall Cognitive Status: Impaired/Different from baseline    Following Commands: Follows one step commands with increased time     Problem Solving: Slow processing, Requires verbal cues, Requires tactile cues General Comments: Pt requires encouragement throughout session but was able to perform desired task requested of her.           General Comments General comments (skin integrity, edema, etc.): pt has poor insight of her abilities. fear anxiety with mobility was a limiting factor during session      Pertinent Vitals/Pain Pain Assessment Pain Assessment: 0-10 Pain Score: 8  Pain Location: Neck Pain Intervention(s): Limited activity within patient's tolerance, Monitored during session, Repositioned, Premedicated before session  PT Goals (current goals can now be found in the care plan section) Acute Rehab PT  Goals Patient Stated Goal: none stated Progress towards PT goals: Progressing toward goals    Frequency    Min 1X/week       AM-PAC PT "6 Clicks" Mobility   Outcome Measure  Help needed turning from your back to your side while in a flat bed without using bedrails?: A Lot Help needed moving from lying on your back to sitting on the side of a flat bed without using bedrails?: A Lot Help needed moving to and from a bed to a chair (including a wheelchair)?: A Little Help needed standing up from a chair using your arms (e.g., wheelchair or bedside chair)?: A Little Help needed to walk in hospital room?: A Little Help needed climbing 3-5 steps with a railing? : A Lot 6 Click Score: 15    End of Session   Activity Tolerance: Patient tolerated treatment well Patient left: in bed;with call bell/phone within reach;with bed alarm set;with family/visitor present Nurse Communication: Mobility status PT Visit Diagnosis: Unsteadiness on feet (R26.81);Repeated falls (R29.6);Muscle weakness (generalized) (M62.81);History of falling (Z91.81);Pain Pain - Right/Left: Right Pain - part of body: Knee     Time: 1050-1104 PT Time Calculation (min) (ACUTE ONLY): 14 min  Charges:    $Therapeutic Activity: 8-22 mins PT General Charges $$ ACUTE PT VISIT: 1 Visit                     Jetta Lout PTA 09/19/23, 11:30 AM

## 2023-09-19 NOTE — TOC Progression Note (Signed)
Transition of Care [facility]) - Progression Note    Patient Details  Name: Amy [name] MRN: [id number] Date of Birth: 1929-03-28  Transition of Care [facility]) CM/SW Contact  Darolyn Rua, [place] Phone Number: 09/19/2023, 9:12 AM  Clinical Narrative:     Clide Cliff with [facility] called this CSW on 9/26 at 5:00 pm to report that patient's daughter had reached out to facility, he reports they are addressing her concerns and will be able to accept patient back if patient/family are willing to return to them for STR prior to returning to [facility].         Expected Discharge Plan and Services                                               Social Determinants of Health (SDOH) Interventions SDOH Screenings   Food Insecurity: No Food Insecurity (09/09/2023)  Housing: Low Risk  (09/09/2023)  Transportation Needs: No Transportation Needs (09/09/2023)  Utilities: Not At Risk (09/09/2023)  Tobacco Use: Low Risk  (09/14/2023)    Readmission Risk Interventions     No data to display

## 2023-09-19 NOTE — Progress Notes (Signed)
PROGRESS NOTE    Amy [name]  QMV:[id number] DOB: [birth year] DOA: 09/14/2023 PCP: Danella Penton, MD     Brief Narrative:    Amy [name] is a 89 year old female with history of GERD, hypertension, depression, anxiety, CAD, who presents to the emergency department for chief concerns of worsening shortness of breath and cough.  At bedside, patient patient was able to tell me her name, age, location, current calendar year.   Patient is eating dinner and she states that the piece taste good.  She states she feels better here than she did at the facility.   Patient reports that she was short of breath and had increased cough.  She denies trauma to her person.  She reports that her bilateral shoulder joints hurt her and this happens sometimes.   Granddaughter at bedside states that she was endorsing some thoracic discomfort.  Patient states that this comfort is surrounding the area where she had the compression fracture with a cement fix.   Patient states she forgot all about that.     Patient is requesting to not go back to that facility.  She endorses generalized weakness.  She also endorses swelling of her lower extremity that is her baseline.  She reports that the swelling and the pain gets worse when she lays in bed all day.     She reports that the facility was not nice to her and did not take good care of her.  They refused to turn off the TV stating that he was not their job. She reports that the facility refused to let her get out of bed and states that she has to stay in bed all day.   Assessment & Plan:   Principal Problem:   Shortness of breath Active Problems:   Atelectasis, bilateral   CAD in native artery   Benign essential HTN   Mixed hyperlipidemia   Hx of deep vein thrombophlebitis of lower extremity   Leukocytosis   History of vertebral compression fracture   Weakness   Depression with anxiety  # Cough, possible aspiration pneumonia Likely sequelae of  recent pneumonia.  CTA no signs of PE. Does have bibasilar atelectasis/infiltrate that is likely contributing. No pulm edema to suggest overt chf but bnp is mildly elevated and there are small b/l pleural effusions. Troponins are negative. Wbc elevated but was on steroids at home. Covid negative. Rvp last week negative. Procal neg but does have productive cough so have elected to treat for aspiration pneumonia given aspiration risk - cont unasyn, tomorrow will be 5th and last day - incentive spirometer, flutter valve  # Hiatal hernia # Dysphagia Likely contributes to reflux and as such risk for aspiration - slp has cleared for dysphagia 3 diet  # Hypoxic respiratory failure Requiring 1-2 liters here, 2/2 atelectasis, poss pneumonia as above. Today weaned off o2 - [place] O2 as needed  # T11/12 compression fractures # Cervical stenosis S/p kyphoplasty in 2019, patient's main complaint is cervical/thoracic neck pain. Mri c/t spine stable from priors - lidocaine patch, duloxetine  # HTN BPs wnl today - holding home dilt - resumed home metop  # CAD No chest pain, normal trops - cont home asa   # Dementia - cont home seroquel  # Constipation - added lactulose today  # Debility PT advising SNF and daughter wants that - [facility] can take patient but not over weekend so plan will be d/c Monday    DVT prophylaxis: lovenox Code  Status: dnr Family Communication: daughter updated @ bedside 9/27  Level of care: Med-Surg Status is: Inpatient Remains inpatient appropriate because: can't d/c to snf until monday    Consultants:  none  Procedures: none  Antimicrobials:  unasyn    Subjective: Back pain stable, tolerating diet, no bm  Objective: Vitals:   09/18/23 2041 09/19/23 0103 09/19/23 0855 09/19/23 1552  BP: (!) 154/59 (!) 149/93 (!) 157/68 131/61  Pulse: 64 66 65 69  Resp: 18 18 14 14   Temp: 98.2 F (36.8 C) (!) 97.5 F (36.4 C) 97.6 F (36.4 C) 98.4 F (36.9  C)  TempSrc: Oral     SpO2: 97% 95% 96% 94%  Weight:        Intake/Output Summary (Last 24 hours) at 09/19/2023 1604 Last data filed at 09/19/2023 0508 Gross per 24 hour  Intake 240 ml  Output 800 ml  Net -560 ml   Filed Weights   09/16/23 0345 09/17/23 0345  Weight: 65.6 kg 65.5 kg    Examination:  General exam: NAD, chronically ill appearing Respiratory system: rales at bases, scattered rhonchi. Improving. Cardiovascular system: S1 & S2 heard, RRR. No JVD, murmurs, rubs, gallops or clicks. No pedal edema. Gastrointestinal system: Abdomen is nondistended, soft and nontender. No organomegaly or masses felt. Normal bowel sounds heard. Central nervous system: Alert, moving all 4 Extremities: warm Skin: No rashes, lesions or ulcers Psychiatry: calm    Data Reviewed: I have personally reviewed following labs and imaging studies  CBC: Recent Labs  Lab 09/14/23 1133 09/15/23 0349  WBC 21.6* 14.1*  NEUTROABS 18.2*  --   HGB 12.6 11.4*  HCT 40.8 35.7*  MCV 91.9 88.8  PLT 396 358   Basic Metabolic Panel: Recent Labs  Lab 09/15/23 0349 09/16/23 0937 09/17/23 0609 09/18/23 0337 09/19/23 0523  NA 139 135 139 137 141  K 4.4 4.1 3.7 4.1 4.3  CL 106 102 107 106 104  CO2 25 23 25 25 28   GLUCOSE 89 130* 112* 107* 92  BUN 16 15 13 16 15   CREATININE 0.55 0.60 0.58 0.57 0.52  CALCIUM 8.7* 8.6* 8.1* 8.3* 8.6*   GFR: Estimated Creatinine Clearance: 40.4 mL/min (by C-G formula based on SCr of 0.52 mg/dL). Liver Function Tests: Recent Labs  Lab 09/14/23 1133  AST 40  ALT 37  ALKPHOS 101  BILITOT 0.6  PROT 6.4*  ALBUMIN 2.8*   No results for input(s): "LIPASE", "AMYLASE" in the last 168 hours. No results for input(s): "AMMONIA" in the last 168 hours. Coagulation Profile: No results for input(s): "INR", "PROTIME" in the last 168 hours.  Cardiac Enzymes: No results for input(s): "CKTOTAL", "CKMB", "CKMBINDEX", "TROPONINI" in the last 168 hours. BNP (last 3  results) No results for input(s): "PROBNP" in the last 8760 hours. HbA1C: No results for input(s): "HGBA1C" in the last 72 hours. CBG: No results for input(s): "GLUCAP" in the last 168 hours.  Lipid Profile: No results for input(s): "CHOL", "HDL", "LDLCALC", "TRIG", "CHOLHDL", "LDLDIRECT" in the last 72 hours. Thyroid Function Tests: No results for input(s): "TSH", "T4TOTAL", "FREET4", "T3FREE", "THYROIDAB" in the last 72 hours. Anemia Panel: No results for input(s): "VITAMINB12", "FOLATE", "FERRITIN", "TIBC", "IRON", "RETICCTPCT" in the last 72 hours. Urine analysis:    Component Value Date/Time   COLORURINE STRAW (A) 09/14/2023 1758   APPEARANCEUR HAZY (A) 09/14/2023 1758   LABSPEC 1.015 09/14/2023 1758   PHURINE 7.0 09/14/2023 1758   GLUCOSEU NEGATIVE 09/14/2023 1758   HGBUR NEGATIVE 09/14/2023 1758  BILIRUBINUR NEGATIVE 09/14/2023 1758   BILIRUBINUR neg 05/13/2016 1055   KETONESUR NEGATIVE 09/14/2023 1758   PROTEINUR NEGATIVE 09/14/2023 1758   UROBILINOGEN 0.2 05/13/2016 1055   NITRITE NEGATIVE 09/14/2023 1758   LEUKOCYTESUR LARGE (A) 09/14/2023 1758   Sepsis Labs: @LABRCNTIP (procalcitonin:4,lacticidven:4)  ) Recent Results (from the past 240 hour(s))  Resp panel by RT-PCR (RSV, Flu A&B, Covid) Anterior Nasal Swab     Status: None   Collection Time: 09/14/23 11:34 AM   Specimen: Anterior Nasal Swab  Result Value Ref Range Status   SARS Coronavirus 2 by RT PCR NEGATIVE NEGATIVE Final    Comment: (NOTE) SARS-CoV-2 target nucleic acids are NOT DETECTED.  The SARS-CoV-2 RNA is generally detectable in upper respiratory specimens during the acute phase of infection. The lowest concentration of SARS-CoV-2 viral copies this assay can detect is 138 copies/mL. A negative result does not preclude SARS-Cov-2 infection and should not be used as the sole basis for treatment or other patient management decisions. A negative result may occur with  improper specimen  collection/handling, submission of specimen other than nasopharyngeal swab, presence of viral mutation(s) within the areas targeted by this assay, and inadequate number of viral copies(<138 copies/mL). A negative result must be combined with clinical observations, patient history, and epidemiological information. The expected result is Negative.  Fact Sheet for Patients:  BloggerCourse.com  Fact Sheet for Healthcare Providers:  SeriousBroker.it  This test is no t yet approved or cleared by the [place] FDA and  has been authorized for detection and/or diagnosis of SARS-CoV-2 by FDA under an Emergency Use Authorization (EUA). This EUA will remain  in effect (meaning this test can be used) for the duration of the COVID-19 declaration under Section 564(b)(1) of the Act, 21 U.S.C.section 360bbb-3(b)(1), unless the authorization is terminated  or revoked sooner.       Influenza A by PCR NEGATIVE NEGATIVE Final   Influenza B by PCR NEGATIVE NEGATIVE Final    Comment: (NOTE) The Xpert Xpress SARS-CoV-2/FLU/RSV plus assay is intended as an aid in the diagnosis of influenza from Nasopharyngeal swab specimens and should not be used as a sole basis for treatment. Nasal washings and aspirates are unacceptable for Xpert Xpress SARS-CoV-2/FLU/RSV testing.  Fact Sheet for Patients: BloggerCourse.com  Fact Sheet for Healthcare Providers: SeriousBroker.it  This test is not yet approved or cleared by the [place] FDA and has been authorized for detection and/or diagnosis of SARS-CoV-2 by FDA under an Emergency Use Authorization (EUA). This EUA will remain in effect (meaning this test can be used) for the duration of the COVID-19 declaration under Section 564(b)(1) of the Act, 21 U.S.C. section 360bbb-3(b)(1), unless the authorization is terminated or revoked.     Resp Syncytial  Virus by PCR NEGATIVE NEGATIVE Final    Comment: (NOTE) Fact Sheet for Patients: BloggerCourse.com  Fact Sheet for Healthcare Providers: SeriousBroker.it  This test is not yet approved or cleared by the [place] FDA and has been authorized for detection and/or diagnosis of SARS-CoV-2 by FDA under an Emergency Use Authorization (EUA). This EUA will remain in effect (meaning this test can be used) for the duration of the COVID-19 declaration under Section 564(b)(1) of the Act, 21 U.S.C. section 360bbb-3(b)(1), unless the authorization is terminated or revoked.  Performed at [facility], [address] [place], [place] [address]   Culture, blood (routine x 2)     Status: None   Collection Time: 09/14/23 12:08 PM   Specimen: BLOOD  Result Value  Ref Range Status   Specimen Description BLOOD LEFT ANTECUBITAL  Final   Special Requests   Final    BOTTLES DRAWN AEROBIC AND ANAEROBIC Blood Culture results may not be optimal due to an inadequate volume of blood received in culture bottles   Culture   Final    NO GROWTH 5 DAYS Performed at [facility], [address] [place], [place] [address]    Report Status 09/19/2023 FINAL  Final  Culture, blood (routine x 2)     Status: None   Collection Time: 09/14/23 12:13 PM   Specimen: BLOOD  Result Value Ref Range Status   Specimen Description BLOOD BLOOD LEFT WRIST  Final   Special Requests   Final    BOTTLES DRAWN AEROBIC AND ANAEROBIC Blood Culture results may not be optimal due to an inadequate volume of blood received in culture bottles   Culture   Final    NO GROWTH 5 DAYS Performed at [facility], [address] [place], [place] [address]    Report Status 09/19/2023 FINAL  Final         Radiology Studies: No results found.      Scheduled Meds:  aspirin EC  81 mg Oral Daily   DULoxetine  20 mg Oral Daily   enoxaparin (LOVENOX)  injection  40 mg Subcutaneous Q24H   lidocaine  1 patch Transdermal Q24H   melatonin  2.5 mg Oral QHS   metoprolol succinate  25 mg Oral Daily   multivitamin with minerals  1 tablet Oral Daily   pantoprazole  40 mg Oral Daily   polyethylene glycol  17 g Oral Daily   QUEtiapine  50 mg Oral BID   senna-docusate  1 tablet Oral QHS   Continuous Infusions:  ampicillin-sulbactam (UNASYN) IV 3 g (09/19/23 1439)     LOS: 5 days     Silvano Bilis, MD [facility]   If 7PM-7AM, please contact night-coverage [email or web address] Password [facility] 09/19/2023, 4:04 PM

## 2023-09-19 NOTE — Plan of Care (Signed)
  Problem: Fluid Volume: Goal: Hemodynamic stability will improve Outcome: Progressing   Problem: Clinical Measurements: Goal: Diagnostic test results will improve Outcome: Progressing   Problem: Respiratory: Goal: Ability to maintain adequate ventilation will improve Outcome: Progressing   Problem: Health Behavior/Discharge Planning: Goal: Ability to manage health-related needs will improve Outcome: Progressing   Problem: Clinical Measurements: Goal: Ability to maintain clinical measurements within normal limits will improve Outcome: Progressing

## 2023-09-20 DIAGNOSIS — R0602 Shortness of breath: Secondary | ICD-10-CM | POA: Diagnosis not present

## 2023-09-20 NOTE — Plan of Care (Signed)

## 2023-09-20 NOTE — Progress Notes (Signed)
PROGRESS NOTE    Amy [name]  YNW:[id number] DOB: [birth year] DOA: 09/14/2023 PCP: Danella Penton, MD     Brief Narrative:    Ms. Eizabeth [name] is a 89 year old female with history of GERD, hypertension, depression, anxiety, CAD, who presents to the emergency department for chief concerns of worsening shortness of breath and cough.  At bedside, patient patient was able to tell me her name, age, location, current calendar year.   Patient is eating dinner and she states that the piece taste good.  She states she feels better here than she did at the facility.   Patient reports that she was short of breath and had increased cough.  She denies trauma to her person.  She reports that her bilateral shoulder joints hurt her and this happens sometimes.   Granddaughter at bedside states that she was endorsing some thoracic discomfort.  Patient states that this comfort is surrounding the area where she had the compression fracture with a cement fix.   Patient states she forgot all about that.     Patient is requesting to not go back to that facility.  She endorses generalized weakness.  She also endorses swelling of her lower extremity that is her baseline.  She reports that the swelling and the pain gets worse when she lays in bed all day.     She reports that the facility was not nice to her and did not take good care of her.  They refused to turn off the TV stating that he was not their job. She reports that the facility refused to let her get out of bed and states that she has to stay in bed all day.   Assessment & Plan:   Principal Problem:   Shortness of breath Active Problems:   Atelectasis, bilateral   CAD in native artery   Benign essential HTN   Mixed hyperlipidemia   Hx of deep vein thrombophlebitis of lower extremity   Leukocytosis   History of vertebral compression fracture   Weakness   Depression with anxiety  # Cough, possible aspiration pneumonia Likely sequelae of  recent pneumonia.  CTA no signs of PE. Does have bibasilar atelectasis/infiltrate that is likely contributing. No pulm edema to suggest overt chf but bnp is mildly elevated and there are small b/l pleural effusions. Troponins are negative. Wbc elevated but was on steroids at home. Covid negative. Rvp last week negative. Procal neg but does have productive cough so have elected to treat for aspiration pneumonia given aspiration risk - cont unasyn, today is last day (5 days) - incentive spirometer, flutter valve  # Hiatal hernia # Dysphagia Likely contributes to reflux and as such risk for aspiration - slp has cleared for dysphagia 3 diet  # Hypoxic respiratory failure 2/2 atelectasis, poss pneumonia as above. Today weaned off o2 9/27  - monitor  # T11/12 compression fractures # Cervical stenosis S/p kyphoplasty in 2019, patient's main complaint is cervical/thoracic neck pain. Mri c/t spine stable from priors - lidocaine patch, duloxetine  # HTN BPs a bit labile - holding home dilt but will resume if bp stays up - continue home metop  # CAD No chest pain, normal trops - cont home asa   # Dementia - cont home seroquel  # Constipation Resolved with lactulose on 9/27 had BM that same day  # Debility PT advising SNF and daughter wants that - [facility] can take patient but not over weekend so plan will be d/c  Monday    DVT prophylaxis: lovenox Code Status: dnr Family Communication: daughter sandra updated @ bedside 9/27. Daughter deborah updated telephonically 9/28.  Level of care: Med-Surg Status is: Inpatient Remains inpatient appropriate because: can't d/c to snf until monday    Consultants:  none  Procedures: none  Antimicrobials:  unasyn    Subjective: Feeling improved, not complaining of pain, no dyspnea, had BM yesterday  Objective: Vitals:   09/19/23 0855 09/19/23 1552 09/19/23 2318 09/20/23 0738  BP: (!) 157/68 131/61 128/69 (!) 160/80  Pulse: 65  69 72 79  Resp: 14 14 18 14   Temp: 97.6 F (36.4 C) 98.4 F (36.9 C) 98.4 F (36.9 C) 97.7 F (36.5 C)  TempSrc:   Oral   SpO2: 96% 94% 94% 95%  Weight:        Intake/Output Summary (Last 24 hours) at 09/20/2023 1345 Last data filed at 09/19/2023 1720 Gross per 24 hour  Intake --  Output 600 ml  Net -600 ml   Filed Weights   09/16/23 0345 09/17/23 0345  Weight: 65.6 kg 65.5 kg    Examination:  General exam: NAD, chronically ill appearing Respiratory system: rales at bases, scattered rhonchi. Improving. Cardiovascular system: S1 & S2 heard, RRR. No JVD, murmurs, rubs, gallops or clicks. No pedal edema. Gastrointestinal system: Abdomen is nondistended, soft and nontender. No organomegaly or masses felt. Normal bowel sounds heard. Central nervous system: Alert, moving all 4 Extremities: warm Skin: No rashes, lesions or ulcers Psychiatry: calm    Data Reviewed: I have personally reviewed following labs and imaging studies  CBC: Recent Labs  Lab 09/14/23 1133 09/15/23 0349  WBC 21.6* 14.1*  NEUTROABS 18.2*  --   HGB 12.6 11.4*  HCT 40.8 35.7*  MCV 91.9 88.8  PLT 396 358   Basic Metabolic Panel: Recent Labs  Lab 09/15/23 0349 09/16/23 0937 09/17/23 0609 09/18/23 0337 09/19/23 0523  NA 139 135 139 137 141  K 4.4 4.1 3.7 4.1 4.3  CL 106 102 107 106 104  CO2 25 23 25 25 28   GLUCOSE 89 130* 112* 107* 92  BUN 16 15 13 16 15   CREATININE 0.55 0.60 0.58 0.57 0.52  CALCIUM 8.7* 8.6* 8.1* 8.3* 8.6*   GFR: Estimated Creatinine Clearance: 40.4 mL/min (by C-G formula based on SCr of 0.52 mg/dL). Liver Function Tests: Recent Labs  Lab 09/14/23 1133  AST 40  ALT 37  ALKPHOS 101  BILITOT 0.6  PROT 6.4*  ALBUMIN 2.8*   No results for input(s): "LIPASE", "AMYLASE" in the last 168 hours. No results for input(s): "AMMONIA" in the last 168 hours. Coagulation Profile: No results for input(s): "INR", "PROTIME" in the last 168 hours.  Cardiac Enzymes: No results  for input(s): "CKTOTAL", "CKMB", "CKMBINDEX", "TROPONINI" in the last 168 hours. BNP (last 3 results) No results for input(s): "PROBNP" in the last 8760 hours. HbA1C: No results for input(s): "HGBA1C" in the last 72 hours. CBG: No results for input(s): "GLUCAP" in the last 168 hours.  Lipid Profile: No results for input(s): "CHOL", "HDL", "LDLCALC", "TRIG", "CHOLHDL", "LDLDIRECT" in the last 72 hours. Thyroid Function Tests: No results for input(s): "TSH", "T4TOTAL", "FREET4", "T3FREE", "THYROIDAB" in the last 72 hours. Anemia Panel: No results for input(s): "VITAMINB12", "FOLATE", "FERRITIN", "TIBC", "IRON", "RETICCTPCT" in the last 72 hours. Urine analysis:    Component Value Date/Time   COLORURINE STRAW (A) 09/14/2023 1758   APPEARANCEUR HAZY (A) 09/14/2023 1758   LABSPEC 1.015 09/14/2023 1758   PHURINE 7.0 09/14/2023  1758   GLUCOSEU NEGATIVE 09/14/2023 1758   HGBUR NEGATIVE 09/14/2023 1758   BILIRUBINUR NEGATIVE 09/14/2023 1758   BILIRUBINUR neg 05/13/2016 1055   KETONESUR NEGATIVE 09/14/2023 1758   PROTEINUR NEGATIVE 09/14/2023 1758   UROBILINOGEN 0.2 05/13/2016 1055   NITRITE NEGATIVE 09/14/2023 1758   LEUKOCYTESUR LARGE (A) 09/14/2023 1758   Sepsis Labs: @LABRCNTIP (procalcitonin:4,lacticidven:4)  ) Recent Results (from the past 240 hour(s))  Resp panel by RT-PCR (RSV, Flu A&B, Covid) Anterior Nasal Swab     Status: None   Collection Time: 09/14/23 11:34 AM   Specimen: Anterior Nasal Swab  Result Value Ref Range Status   SARS Coronavirus 2 by RT PCR NEGATIVE NEGATIVE Final    Comment: (NOTE) SARS-CoV-2 target nucleic acids are NOT DETECTED.  The SARS-CoV-2 RNA is generally detectable in upper respiratory specimens during the acute phase of infection. The lowest concentration of SARS-CoV-2 viral copies this assay can detect is 138 copies/mL. A negative result does not preclude SARS-Cov-2 infection and should not be used as the sole basis for treatment or other  patient management decisions. A negative result may occur with  improper specimen collection/handling, submission of specimen other than nasopharyngeal swab, presence of viral mutation(s) within the areas targeted by this assay, and inadequate number of viral copies(<138 copies/mL). A negative result must be combined with clinical observations, patient history, and epidemiological information. The expected result is Negative.  Fact Sheet for Patients:  BloggerCourse.com  Fact Sheet for Healthcare Providers:  SeriousBroker.it  This test is no t yet approved or cleared by the [place] FDA and  has been authorized for detection and/or diagnosis of SARS-CoV-2 by FDA under an Emergency Use Authorization (EUA). This EUA will remain  in effect (meaning this test can be used) for the duration of the COVID-19 declaration under Section 564(b)(1) of the Act, 21 U.S.C.section 360bbb-3(b)(1), unless the authorization is terminated  or revoked sooner.       Influenza A by PCR NEGATIVE NEGATIVE Final   Influenza B by PCR NEGATIVE NEGATIVE Final    Comment: (NOTE) The Xpert Xpress SARS-CoV-2/FLU/RSV plus assay is intended as an aid in the diagnosis of influenza from Nasopharyngeal swab specimens and should not be used as a sole basis for treatment. Nasal washings and aspirates are unacceptable for Xpert Xpress SARS-CoV-2/FLU/RSV testing.  Fact Sheet for Patients: BloggerCourse.com  Fact Sheet for Healthcare Providers: SeriousBroker.it  This test is not yet approved or cleared by the [place] FDA and has been authorized for detection and/or diagnosis of SARS-CoV-2 by FDA under an Emergency Use Authorization (EUA). This EUA will remain in effect (meaning this test can be used) for the duration of the COVID-19 declaration under Section 564(b)(1) of the Act, 21 U.S.C. section  360bbb-3(b)(1), unless the authorization is terminated or revoked.     Resp Syncytial Virus by PCR NEGATIVE NEGATIVE Final    Comment: (NOTE) Fact Sheet for Patients: BloggerCourse.com  Fact Sheet for Healthcare Providers: SeriousBroker.it  This test is not yet approved or cleared by the [place] FDA and has been authorized for detection and/or diagnosis of SARS-CoV-2 by FDA under an Emergency Use Authorization (EUA). This EUA will remain in effect (meaning this test can be used) for the duration of the COVID-19 declaration under Section 564(b)(1) of the Act, 21 U.S.C. section 360bbb-3(b)(1), unless the authorization is terminated or revoked.  Performed at [facility], [address] [place], [place] [address]   Culture, blood (routine x 2)     Status:  None   Collection Time: 09/14/23 12:08 PM   Specimen: BLOOD  Result Value Ref Range Status   Specimen Description BLOOD LEFT ANTECUBITAL  Final   Special Requests   Final    BOTTLES DRAWN AEROBIC AND ANAEROBIC Blood Culture results may not be optimal due to an inadequate volume of blood received in culture bottles   Culture   Final    NO GROWTH 5 DAYS Performed at [facility], [address] [place], [place] [address]    Report Status 09/19/2023 FINAL  Final  Culture, blood (routine x 2)     Status: None   Collection Time: 09/14/23 12:13 PM   Specimen: BLOOD  Result Value Ref Range Status   Specimen Description BLOOD BLOOD LEFT WRIST  Final   Special Requests   Final    BOTTLES DRAWN AEROBIC AND ANAEROBIC Blood Culture results may not be optimal due to an inadequate volume of blood received in culture bottles   Culture   Final    NO GROWTH 5 DAYS Performed at [facility], [address] [place], [place] [address]    Report Status 09/19/2023 FINAL  Final         Radiology Studies: No results found.      Scheduled Meds:   aspirin EC  81 mg Oral Daily   DULoxetine  20 mg Oral Daily   enoxaparin (LOVENOX) injection  40 mg Subcutaneous Q24H   lidocaine  1 patch Transdermal Q24H   melatonin  2.5 mg Oral QHS   metoprolol succinate  25 mg Oral Daily   multivitamin with minerals  1 tablet Oral Daily   pantoprazole  40 mg Oral Daily   polyethylene glycol  17 g Oral Daily   QUEtiapine  50 mg Oral BID   senna-docusate  1 tablet Oral QHS   Continuous Infusions:  ampicillin-sulbactam (UNASYN) IV 3 g (09/20/23 1316)     LOS: 6 days     Silvano Bilis, MD [facility]   If 7PM-7AM, please contact night-coverage [email or web address] Password [email or web address] 09/20/2023, 1:45 PM

## 2023-09-20 NOTE — Plan of Care (Signed)

## 2023-09-21 DIAGNOSIS — R0602 Shortness of breath: Secondary | ICD-10-CM | POA: Diagnosis not present

## 2023-09-21 MED ORDER — ACETAMINOPHEN 650 MG RE SUPP: 650.0000 mg | Freq: Four times a day (QID) | RECTAL | Status: DC | PRN

## 2023-09-21 MED ORDER — DILTIAZEM HCL ER COATED BEADS 120 MG PO CP24
120.0000 mg | ORAL_CAPSULE | Freq: Every day | ORAL | Status: DC
  Administered 2023-09-22: 120 mg via ORAL
  Filled 2023-09-21: qty 1

## 2023-09-21 MED ORDER — HYDROCODONE-ACETAMINOPHEN 5-325 MG PO TABS: 1.0000 | ORAL_TABLET | ORAL | Status: DC | PRN

## 2023-09-21 MED ORDER — ACETAMINOPHEN 325 MG PO TABS: 650.0000 mg | ORAL_TABLET | Freq: Four times a day (QID) | ORAL | Status: DC | PRN

## 2023-09-21 MED ORDER — ACETAMINOPHEN 325 MG PO TABS
ORAL_TABLET | ORAL | Status: AC
  Administered 2023-09-21: 650 mg via ORAL
  Filled 2023-09-21: qty 2

## 2023-09-21 NOTE — Plan of Care (Signed)

## 2023-09-21 NOTE — Plan of Care (Signed)

## 2023-09-21 NOTE — Progress Notes (Signed)
PROGRESS NOTE    Amy [name]  ZHY:[id number] DOB: [birth year] DOA: 09/14/2023 PCP: Danella Penton, MD     Brief Narrative:    [name] is a 89 year old female with history of GERD, hypertension, depression, anxiety, CAD, who presents to the emergency department for chief concerns of worsening shortness of breath and cough.  At bedside, patient patient was able to tell me her name, age, location, current calendar year.   Patient is eating dinner and she states that the piece taste good.  She states she feels better here than she did at the facility.   Patient reports that she was short of breath and had increased cough.  She denies trauma to her person.  She reports that her bilateral shoulder joints hurt her and this happens sometimes.   Granddaughter at bedside states that she was endorsing some thoracic discomfort.  Patient states that this comfort is surrounding the area where she had the compression fracture with a cement fix.   Patient states she forgot all about that.     Patient is requesting to not go back to that facility.  She endorses generalized weakness.  She also endorses swelling of her lower extremity that is her baseline.  She reports that the swelling and the pain gets worse when she lays in bed all day.     She reports that the facility was not nice to her and did not take good care of her.  They refused to turn off the TV stating that he was not their job. She reports that the facility refused to let her get out of bed and states that she has to stay in bed all day.   Assessment & Plan:   Principal Problem:   Shortness of breath Active Problems:   Atelectasis, bilateral   CAD in native artery   Benign essential HTN   Mixed hyperlipidemia   Hx of deep vein thrombophlebitis of lower extremity   Leukocytosis   History of vertebral compression fracture   Weakness   Depression with anxiety  # Cough, possible aspiration pneumonia Likely sequelae of  recent pneumonia.  CTA no signs of PE. Does have bibasilar atelectasis/infiltrate that is likely contributing. No pulm edema to suggest overt chf but bnp is mildly elevated and there are small b/l pleural effusions. Troponins are negative. Wbc elevated but was on steroids at home. Covid negative. Rvp last week negative. Procal neg but does have productive cough so have elected to treat for aspiration pneumonia given aspiration risk. Now s/p 5 days unasyn - continue incentive spirometer, flutter valve  # Hiatal hernia # Dysphagia Likely contributes to reflux and as such risk for aspiration - slp has cleared for dysphagia 3 diet  # Hypoxic respiratory failure 2/2 atelectasis, poss pneumonia as above. Weaned off o2 9/27 - monitor  # T11/12 compression fractures # Cervical stenosis S/p kyphoplasty in 2019, patient's main complaint is cervical/thoracic neck pain. Mri c/t spine stable from priors - lidocaine patch (daughter wants this at d/c), duloxetine  # HTN BPs a bit labile but persistently up the last few days - resume home dilt tomorrow - continue home metop  # CAD No chest pain, normal trops - cont home asa   # Dementia - cont home seroquel  # Constipation Resolved    # Debility PT advising SNF and daughter wants that - [facility] can take patient but not over weekend so plan will be d/c tomorrow    DVT prophylaxis:  lovenox Code Status: dnr Family Communication: daughter sandra updated @ bedside 9/29  Level of care: Med-Surg Status is: Inpatient Remains inpatient appropriate because: can't d/c to snf until monday    Consultants:  none  Procedures: none  Antimicrobials:  S/p unasyn    Subjective: Breathing stable, tolerating PO, regular BMs  Objective: Vitals:   09/20/23 0738 09/20/23 1458 09/20/23 2309 09/21/23 0740  BP: (!) 160/80 (!) 143/74 (!) 127/58 (!) 169/73  Pulse: 79 73 72 67  Resp: 14 14 20 16   Temp: 97.7 F (36.5 C) 98.6 F (37 C) 97.9  F (36.6 C) 97.7 F (36.5 C)  TempSrc:      SpO2: 95% 92% 92% 93%  Weight:        Intake/Output Summary (Last 24 hours) at 09/21/2023 1253 Last data filed at 09/20/2023 1859 Gross per 24 hour  Intake 240 ml  Output --  Net 240 ml   Filed Weights   09/16/23 0345 09/17/23 0345  Weight: 65.6 kg 65.5 kg    Examination:  General exam: NAD, chronically ill appearing Respiratory system: rales at bases, scattered rhonchi.  Cardiovascular system: S1 & S2 heard, RRR. No JVD, murmurs, rubs, gallops or clicks. No pedal edema. Gastrointestinal system: Abdomen is nondistended, soft and nontender.   Central nervous system: Alert, moving all 4 Extremities: warm Skin: No visible rashes, lesions or ulcers Psychiatry: calm    Data Reviewed: I have personally reviewed following labs and imaging studies  CBC: Recent Labs  Lab 09/15/23 0349  WBC 14.1*  HGB 11.4*  HCT 35.7*  MCV 88.8  PLT 358   Basic Metabolic Panel: Recent Labs  Lab 09/15/23 0349 09/16/23 0937 09/17/23 0609 09/18/23 0337 09/19/23 0523  NA 139 135 139 137 141  K 4.4 4.1 3.7 4.1 4.3  CL 106 102 107 106 104  CO2 25 23 25 25 28   GLUCOSE 89 130* 112* 107* 92  BUN 16 15 13 16 15   CREATININE 0.55 0.60 0.58 0.57 0.52  CALCIUM 8.7* 8.6* 8.1* 8.3* 8.6*   GFR: Estimated Creatinine Clearance: 40.4 mL/min (by C-G formula based on SCr of 0.52 mg/dL). Liver Function Tests: No results for input(s): "AST", "ALT", "ALKPHOS", "BILITOT", "PROT", "ALBUMIN" in the last 168 hours.  No results for input(s): "LIPASE", "AMYLASE" in the last 168 hours. No results for input(s): "AMMONIA" in the last 168 hours. Coagulation Profile: No results for input(s): "INR", "PROTIME" in the last 168 hours.  Cardiac Enzymes: No results for input(s): "CKTOTAL", "CKMB", "CKMBINDEX", "TROPONINI" in the last 168 hours. BNP (last 3 results) No results for input(s): "PROBNP" in the last 8760 hours. HbA1C: No results for input(s): "HGBA1C" in  the last 72 hours. CBG: No results for input(s): "GLUCAP" in the last 168 hours.  Lipid Profile: No results for input(s): "CHOL", "HDL", "LDLCALC", "TRIG", "CHOLHDL", "LDLDIRECT" in the last 72 hours. Thyroid Function Tests: No results for input(s): "TSH", "T4TOTAL", "FREET4", "T3FREE", "THYROIDAB" in the last 72 hours. Anemia Panel: No results for input(s): "VITAMINB12", "FOLATE", "FERRITIN", "TIBC", "IRON", "RETICCTPCT" in the last 72 hours. Urine analysis:    Component Value Date/Time   COLORURINE STRAW (A) 09/14/2023 1758   APPEARANCEUR HAZY (A) 09/14/2023 1758   LABSPEC 1.015 09/14/2023 1758   PHURINE 7.0 09/14/2023 1758   GLUCOSEU NEGATIVE 09/14/2023 1758   HGBUR NEGATIVE 09/14/2023 1758   BILIRUBINUR NEGATIVE 09/14/2023 1758   BILIRUBINUR neg 05/13/2016 1055   KETONESUR NEGATIVE 09/14/2023 1758   PROTEINUR NEGATIVE 09/14/2023 1758   UROBILINOGEN 0.2 05/13/2016  1055   NITRITE NEGATIVE 09/14/2023 1758   LEUKOCYTESUR LARGE (A) 09/14/2023 1758   Sepsis Labs: @LABRCNTIP (procalcitonin:4,lacticidven:4)  ) Recent Results (from the past 240 hour(s))  Resp panel by RT-PCR (RSV, Flu A&B, Covid) Anterior Nasal Swab     Status: None   Collection Time: 09/14/23 11:34 AM   Specimen: Anterior Nasal Swab  Result Value Ref Range Status   SARS Coronavirus 2 by RT PCR NEGATIVE NEGATIVE Final    Comment: (NOTE) SARS-CoV-2 target nucleic acids are NOT DETECTED.  The SARS-CoV-2 RNA is generally detectable in upper respiratory specimens during the acute phase of infection. The lowest concentration of SARS-CoV-2 viral copies this assay can detect is 138 copies/mL. A negative result does not preclude SARS-Cov-2 infection and should not be used as the sole basis for treatment or other patient management decisions. A negative result may occur with  improper specimen collection/handling, submission of specimen other than nasopharyngeal swab, presence of viral mutation(s) within the areas  targeted by this assay, and inadequate number of viral copies(<138 copies/mL). A negative result must be combined with clinical observations, patient history, and epidemiological information. The expected result is Negative.  Fact Sheet for Patients:  BloggerCourse.com  Fact Sheet for Healthcare Providers:  SeriousBroker.it  This test is no t yet approved or cleared by the [place] FDA and  has been authorized for detection and/or diagnosis of SARS-CoV-2 by FDA under an Emergency Use Authorization (EUA). This EUA will remain  in effect (meaning this test can be used) for the duration of the COVID-19 declaration under Section 564(b)(1) of the Act, 21 U.S.C.section 360bbb-3(b)(1), unless the authorization is terminated  or revoked sooner.       Influenza A by PCR NEGATIVE NEGATIVE Final   Influenza B by PCR NEGATIVE NEGATIVE Final    Comment: (NOTE) The Xpert Xpress SARS-CoV-2/FLU/RSV plus assay is intended as an aid in the diagnosis of influenza from Nasopharyngeal swab specimens and should not be used as a sole basis for treatment. Nasal washings and aspirates are unacceptable for Xpert Xpress SARS-CoV-2/FLU/RSV testing.  Fact Sheet for Patients: BloggerCourse.com  Fact Sheet for Healthcare Providers: SeriousBroker.it  This test is not yet approved or cleared by the [place] FDA and has been authorized for detection and/or diagnosis of SARS-CoV-2 by FDA under an Emergency Use Authorization (EUA). This EUA will remain in effect (meaning this test can be used) for the duration of the COVID-19 declaration under Section 564(b)(1) of the Act, 21 U.S.C. section 360bbb-3(b)(1), unless the authorization is terminated or revoked.     Resp Syncytial Virus by PCR NEGATIVE NEGATIVE Final    Comment: (NOTE) Fact Sheet for  Patients: BloggerCourse.com  Fact Sheet for Healthcare Providers: SeriousBroker.it  This test is not yet approved or cleared by the [place] FDA and has been authorized for detection and/or diagnosis of SARS-CoV-2 by FDA under an Emergency Use Authorization (EUA). This EUA will remain in effect (meaning this test can be used) for the duration of the COVID-19 declaration under Section 564(b)(1) of the Act, 21 U.S.C. section 360bbb-3(b)(1), unless the authorization is terminated or revoked.  Performed at [facility], [address] [place], [place] [address]   Culture, blood (routine x 2)     Status: None   Collection Time: 09/14/23 12:08 PM   Specimen: BLOOD  Result Value Ref Range Status   Specimen Description BLOOD LEFT ANTECUBITAL  Final   Special Requests   Final    BOTTLES DRAWN AEROBIC AND ANAEROBIC  Blood Culture results may not be optimal due to an inadequate volume of blood received in culture bottles   Culture   Final    NO GROWTH 5 DAYS Performed at [facility], [address] [place], [place] [address]    Report Status 09/19/2023 FINAL  Final  Culture, blood (routine x 2)     Status: None   Collection Time: 09/14/23 12:13 PM   Specimen: BLOOD  Result Value Ref Range Status   Specimen Description BLOOD BLOOD LEFT WRIST  Final   Special Requests   Final    BOTTLES DRAWN AEROBIC AND ANAEROBIC Blood Culture results may not be optimal due to an inadequate volume of blood received in culture bottles   Culture   Final    NO GROWTH 5 DAYS Performed at [facility], [address] [place], [place] [address]    Report Status 09/19/2023 FINAL  Final         Radiology Studies: No results found.      Scheduled Meds:  aspirin EC  81 mg Oral Daily   DULoxetine  20 mg Oral Daily   enoxaparin (LOVENOX) injection  40 mg Subcutaneous Q24H   lidocaine  1 patch Transdermal Q24H    melatonin  2.5 mg Oral QHS   metoprolol succinate  25 mg Oral Daily   multivitamin with minerals  1 tablet Oral Daily   pantoprazole  40 mg Oral Daily   polyethylene glycol  17 g Oral Daily   QUEtiapine  50 mg Oral BID   senna-docusate  1 tablet Oral QHS   Continuous Infusions:     LOS: 7 days     Silvano Bilis, MD [facility]   If 7PM-7AM, please contact night-coverage [email or web address] Password [facility] 09/21/2023, 12:53 PM

## 2023-09-22 DIAGNOSIS — R0602 Shortness of breath: Secondary | ICD-10-CM | POA: Diagnosis not present

## 2023-09-22 MED ORDER — POLYETHYLENE GLYCOL 3350 17 G PO PACK: 17.0000 g | PACK | Freq: Every day | ORAL | Status: AC

## 2023-09-22 MED ORDER — LIDOCAINE 5 % EX PTCH: 1.0000 | MEDICATED_PATCH | CUTANEOUS | Status: AC

## 2023-09-22 NOTE — TOC Transition Note (Signed)
Transition of Care [facility]) - CM/SW Discharge Note   Patient Details  Name: Amy [name] MRN: [id number] Date of Birth: 1929/10/16  [facility] CM/SW Contact:  Marlowe Sax, RN Phone Number: 09/22/2023, 11:03 AM   Clinical Narrative:    The [facility] to [facility] E12 today EMS called to transport She is nxt on EMS list Daughter in the room and made aware   Final next level of care: Skilled Nursing Facility Barriers to Discharge: SNF Pending bed offer   Patient Goals and CMS Choice      Discharge Placement                         Discharge Plan and Services Additional resources added to the After Visit Summary for     Discharge Planning Services: CM Consult              DME Agency: NA       HH Arranged: NA          Social Determinants of Health (SDOH) Interventions SDOH Screenings   Food Insecurity: No Food Insecurity (09/09/2023)  Housing: Low Risk  (09/09/2023)  Transportation Needs: No Transportation Needs (09/09/2023)  Utilities: Not At Risk (09/09/2023)  Tobacco Use: Low Risk  (09/14/2023)     Readmission Risk Interventions     No data to display

## 2023-09-22 NOTE — Plan of Care (Signed)
  Problem: Fluid Volume: Goal: Hemodynamic stability will improve Outcome: Adequate for Discharge   Problem: Clinical Measurements: Goal: Diagnostic test results will improve Outcome: Adequate for Discharge Goal: Signs and symptoms of infection will decrease Outcome: Adequate for Discharge   Problem: Respiratory: Goal: Ability to maintain adequate ventilation will improve Outcome: Adequate for Discharge   Problem: Education: Goal: Knowledge of General Education information will improve Description: Including pain rating scale, medication(s)/side effects and non-pharmacologic comfort measures Outcome: Adequate for Discharge   Problem: Health Behavior/Discharge Planning: Goal: Ability to manage health-related needs will improve Outcome: Adequate for Discharge   Problem: Clinical Measurements: Goal: Ability to maintain clinical measurements within normal limits will improve Outcome: Adequate for Discharge Goal: Will remain free from infection Outcome: Adequate for Discharge Goal: Diagnostic test results will improve Outcome: Adequate for Discharge Goal: Respiratory complications will improve Outcome: Adequate for Discharge Goal: Cardiovascular complication will be avoided Outcome: Adequate for Discharge   Problem: Activity: Goal: Risk for activity intolerance will decrease Outcome: Adequate for Discharge   Problem: Nutrition: Goal: Adequate nutrition will be maintained Outcome: Adequate for Discharge   Problem: Coping: Goal: Level of anxiety will decrease Outcome: Adequate for Discharge   Problem: Elimination: Goal: Will not experience complications related to bowel motility Outcome: Adequate for Discharge Goal: Will not experience complications related to urinary retention Outcome: Adequate for Discharge   Problem: Pain Managment: Goal: General experience of comfort will improve Outcome: Adequate for Discharge   Problem: Safety: Goal: Ability to remain free  from injury will improve Outcome: Adequate for Discharge   Problem: Skin Integrity: Goal: Risk for impaired skin integrity will decrease Outcome: Adequate for Discharge   Problem: Acute Rehab PT Goals(only PT should resolve) Goal: Pt Will Go Supine/Side To Sit Outcome: Adequate for Discharge Goal: Patient Will Transfer Sit To/From Stand Outcome: Adequate for Discharge Goal: Pt Will Transfer Bed To Chair/Chair To Bed Outcome: Adequate for Discharge Goal: Pt Will Ambulate Outcome: Adequate for Discharge Goal: Pt/caregiver will Perform Home Exercise Program Outcome: Adequate for Discharge   Problem: Acute Rehab OT Goals (only OT should resolve) Goal: Pt. Will Perform Grooming Outcome: Adequate for Discharge Goal: Pt. Will Perform Lower Body Dressing Outcome: Adequate for Discharge Goal: Pt. Will Transfer To Toilet Outcome: Adequate for Discharge Goal: Pt. Will Perform Toileting-Clothing Manipulation Outcome: Adequate for Discharge

## 2023-09-22 NOTE — Care Management Important Message (Signed)
Important Message  Patient Details  Name: Amy [name] MRN: [id number] Date of Birth: 1929-04-22   Important Message Given:  Yes - Medicare IM     Verita Schneiders Tyesha Joffe 09/22/2023, 9:58 AM

## 2023-09-22 NOTE — Progress Notes (Signed)
Contacted [facility] and reported to nurse Johnny Bridge.

## 2023-09-22 NOTE — Progress Notes (Signed)
EMS transported pt from [facility] to [facility].  

## 2023-09-22 NOTE — Discharge Summary (Signed)
Contact information for after-discharge care     Destination     [facility] .   Service: Skilled Nursing Contact information: [address] [place] [phone]                      The results of significant diagnostics from this hospitalization (including imaging, microbiology, ancillary and laboratory) are listed below for reference.    Significant Diagnostic Studies: ECHOCARDIOGRAM COMPLETE  Result Date: 09/16/2023    ECHOCARDIOGRAM REPORT   Patient Name:   Amy [name] [facility] Date of Exam: 09/15/2023 Medical Rec #:  [id number]         Height:       65.0 in Accession #:    [id number]        Weight:       144.0 lb Date of Birth:  [birth year]         BSA:          1.720 m Patient Age:    89 years          BP:           169/69 mmHg Patient Gender: F                 HR:           75 bpm. Exam Location:  [facility] Procedure: 2D Echo, Cardiac Doppler and Color Doppler Indications:     R06.00 Dyspnea  History:         Patient has prior history of Echocardiogram examinations, most                  recent 09/12/2016. Signs/Symptoms:Dyspnea; Risk                  Factors:Hypertension and Dyslipidemia.  Sonographer:     Daphine Deutscher RDCS  Referring Phys:  [id number] Wilfred Curtis SEGB Diagnosing Phys: Marcina Millard MD IMPRESSIONS  1. Left ventricular ejection fraction, by estimation, is 60 to 65%. The left ventricle has normal function. The left ventricle has no regional wall motion abnormalities. Left ventricular diastolic parameters are consistent with Grade I diastolic dysfunction (impaired relaxation).  2. Right ventricular systolic function is normal. The right ventricular size is normal.  3. The mitral valve is normal in structure. No evidence of mitral valve regurgitation. No evidence of mitral stenosis.  4. The aortic valve is normal in structure. Aortic valve regurgitation is trivial. No aortic stenosis is present.  5. The inferior vena cava is normal in size with greater than 50% respiratory variability, suggesting right atrial pressure of 3 mmHg. FINDINGS  Left Ventricle: Left ventricular ejection fraction, by estimation, is 60 to 65%. The left ventricle has normal function. The left ventricle has no regional wall motion abnormalities. The left ventricular internal cavity size was normal in size. There is  no left ventricular hypertrophy. Left ventricular diastolic parameters are consistent with Grade I diastolic dysfunction (impaired relaxation). Right Ventricle: The right ventricular size is normal. No increase in right ventricular wall thickness. Right ventricular systolic function is normal. Left Atrium: Left atrial size was normal in size. Right Atrium: Right atrial size was normal in size. Pericardium: There is no evidence of pericardial effusion. Mitral Valve: The mitral valve is normal in structure. No evidence of mitral valve regurgitation. No evidence of mitral valve stenosis. Tricuspid Valve: The tricuspid valve is normal in structure. Tricuspid valve regurgitation is mild . No evidence of tricuspid stenosis.  Contact information for after-discharge care     Destination     [facility] .   Service: Skilled Nursing Contact information: [address] [place] [phone]                      The results of significant diagnostics from this hospitalization (including imaging, microbiology, ancillary and laboratory) are listed below for reference.    Significant Diagnostic Studies: ECHOCARDIOGRAM COMPLETE  Result Date: 09/16/2023    ECHOCARDIOGRAM REPORT   Patient Name:   Amy [name] [facility] Date of Exam: 09/15/2023 Medical Rec #:  [id number]         Height:       65.0 in Accession #:    [id number]        Weight:       144.0 lb Date of Birth:  [birth year]         BSA:          1.720 m Patient Age:    89 years          BP:           169/69 mmHg Patient Gender: F                 HR:           75 bpm. Exam Location:  [facility] Procedure: 2D Echo, Cardiac Doppler and Color Doppler Indications:     R06.00 Dyspnea  History:         Patient has prior history of Echocardiogram examinations, most                  recent 09/12/2016. Signs/Symptoms:Dyspnea; Risk                  Factors:Hypertension and Dyslipidemia.  Sonographer:     Daphine Deutscher RDCS  Referring Phys:  [id number] Wilfred Curtis SEGB Diagnosing Phys: Marcina Millard MD IMPRESSIONS  1. Left ventricular ejection fraction, by estimation, is 60 to 65%. The left ventricle has normal function. The left ventricle has no regional wall motion abnormalities. Left ventricular diastolic parameters are consistent with Grade I diastolic dysfunction (impaired relaxation).  2. Right ventricular systolic function is normal. The right ventricular size is normal.  3. The mitral valve is normal in structure. No evidence of mitral valve regurgitation. No evidence of mitral stenosis.  4. The aortic valve is normal in structure. Aortic valve regurgitation is trivial. No aortic stenosis is present.  5. The inferior vena cava is normal in size with greater than 50% respiratory variability, suggesting right atrial pressure of 3 mmHg. FINDINGS  Left Ventricle: Left ventricular ejection fraction, by estimation, is 60 to 65%. The left ventricle has normal function. The left ventricle has no regional wall motion abnormalities. The left ventricular internal cavity size was normal in size. There is  no left ventricular hypertrophy. Left ventricular diastolic parameters are consistent with Grade I diastolic dysfunction (impaired relaxation). Right Ventricle: The right ventricular size is normal. No increase in right ventricular wall thickness. Right ventricular systolic function is normal. Left Atrium: Left atrial size was normal in size. Right Atrium: Right atrial size was normal in size. Pericardium: There is no evidence of pericardial effusion. Mitral Valve: The mitral valve is normal in structure. No evidence of mitral valve regurgitation. No evidence of mitral valve stenosis. Tricuspid Valve: The tricuspid valve is normal in structure. Tricuspid valve regurgitation is mild . No evidence of tricuspid stenosis.  Amy [name] ZOX:[id number] DOB: 01/04/1929 DOA: 09/14/2023  PCP: Danella Penton, MD  Admit date: 09/14/2023 Discharge date: 09/22/2023  Time spent: 35 minutes  Recommendations for Outpatient Follow-up:  Pcp f/u     Discharge Diagnoses:  Principal Problem:   Shortness of breath Active Problems:   Atelectasis, bilateral   CAD in native artery   Benign essential HTN   Mixed hyperlipidemia   Hx of deep vein thrombophlebitis of lower extremity   Leukocytosis   History of vertebral compression fracture   Weakness   Depression with anxiety   Discharge Condition: improved  Diet recommendation: dysphagia 3  Filed Weights   09/16/23 0345 09/17/23 0345  Weight: 65.6 kg 65.5 kg    History of present illness:  From admission h and p Ms. Halea Lieb is a 89 year old female with history of GERD, hypertension, depression, anxiety, CAD, who presents to the emergency department for chief concerns of worsening shortness of breath and cough.   At bedside, patient patient was able to tell me her name, age, location, current calendar year.   Patient is eating dinner and she states that the piece taste good.  She states she feels better here than she did at the facility.   Patient reports that she was short of breath and had increased cough.  She denies trauma to her person.  She reports that her bilateral shoulder joints hurt her and this happens sometimes.   Granddaughter at bedside states that she was endorsing some thoracic discomfort.  Patient states that this comfort is surrounding the area where she had the compression fracture with a cement fix.   Patient states she forgot all about that.     Patient is requesting to not go back to that facility.  She endorses generalized weakness.  She also endorses swelling of her lower extremity that is her baseline.  She reports that the swelling and the pain gets worse when she lays in bed all day.     She reports that the facility was  not nice to her and did not take good care of her.  They refused to turn off the TV stating that he was not their job. She reports that the facility refused to let her get out of bed and states that she has to stay in bed all day.  Hospital Course:  # Cough, possible aspiration pneumonia Symptoms possibly sequelae of recent pneumonia.  CTA no signs of PE. Does have bibasilar atelectasis/infiltrate that is likely contributing. No pulm edema to suggest overt chf but bnp is mildly elevated and there are small b/l pleural effusions. Troponins are negative. Wbc elevated but was on steroids at home. Covid negative. Rvp last week negative. Procal neg but does have productive cough so here we have elected to treat for aspiration pneumonia given aspiration risk. Now s/p 5 days unasyn and clinically much improved - continue incentive spirometer   # Hiatal hernia # Dysphagia Likely contributes to reflux and as such risk for aspiration - slp has cleared for dysphagia 3 diet   # Hypoxic respiratory failure 2/2 atelectasis, poss pneumonia as above. Weaned off o2 9/27 - monitor   # T11/12 compression fractures # Cervical stenosis S/p kyphoplasty in 2019, patient's main complaint is cervical/thoracic neck pain. Mri c/t spine stable from priors - lidocaine patch and duloxetine   # HTN BPs a bit labile but persistently up the last few days - resume home dilt  - continue home metop   # CAD  Amy [name] ZOX:[id number] DOB: 01/04/1929 DOA: 09/14/2023  PCP: Danella Penton, MD  Admit date: 09/14/2023 Discharge date: 09/22/2023  Time spent: 35 minutes  Recommendations for Outpatient Follow-up:  Pcp f/u     Discharge Diagnoses:  Principal Problem:   Shortness of breath Active Problems:   Atelectasis, bilateral   CAD in native artery   Benign essential HTN   Mixed hyperlipidemia   Hx of deep vein thrombophlebitis of lower extremity   Leukocytosis   History of vertebral compression fracture   Weakness   Depression with anxiety   Discharge Condition: improved  Diet recommendation: dysphagia 3  Filed Weights   09/16/23 0345 09/17/23 0345  Weight: 65.6 kg 65.5 kg    History of present illness:  From admission h and p Ms. Halea Lieb is a 89 year old female with history of GERD, hypertension, depression, anxiety, CAD, who presents to the emergency department for chief concerns of worsening shortness of breath and cough.   At bedside, patient patient was able to tell me her name, age, location, current calendar year.   Patient is eating dinner and she states that the piece taste good.  She states she feels better here than she did at the facility.   Patient reports that she was short of breath and had increased cough.  She denies trauma to her person.  She reports that her bilateral shoulder joints hurt her and this happens sometimes.   Granddaughter at bedside states that she was endorsing some thoracic discomfort.  Patient states that this comfort is surrounding the area where she had the compression fracture with a cement fix.   Patient states she forgot all about that.     Patient is requesting to not go back to that facility.  She endorses generalized weakness.  She also endorses swelling of her lower extremity that is her baseline.  She reports that the swelling and the pain gets worse when she lays in bed all day.     She reports that the facility was  not nice to her and did not take good care of her.  They refused to turn off the TV stating that he was not their job. She reports that the facility refused to let her get out of bed and states that she has to stay in bed all day.  Hospital Course:  # Cough, possible aspiration pneumonia Symptoms possibly sequelae of recent pneumonia.  CTA no signs of PE. Does have bibasilar atelectasis/infiltrate that is likely contributing. No pulm edema to suggest overt chf but bnp is mildly elevated and there are small b/l pleural effusions. Troponins are negative. Wbc elevated but was on steroids at home. Covid negative. Rvp last week negative. Procal neg but does have productive cough so here we have elected to treat for aspiration pneumonia given aspiration risk. Now s/p 5 days unasyn and clinically much improved - continue incentive spirometer   # Hiatal hernia # Dysphagia Likely contributes to reflux and as such risk for aspiration - slp has cleared for dysphagia 3 diet   # Hypoxic respiratory failure 2/2 atelectasis, poss pneumonia as above. Weaned off o2 9/27 - monitor   # T11/12 compression fractures # Cervical stenosis S/p kyphoplasty in 2019, patient's main complaint is cervical/thoracic neck pain. Mri c/t spine stable from priors - lidocaine patch and duloxetine   # HTN BPs a bit labile but persistently up the last few days - resume home dilt  - continue home metop   # CAD  Contact information for after-discharge care     Destination     [facility] .   Service: Skilled Nursing Contact information: [address]  [address] [place] [phone]                      The results of significant diagnostics from this hospitalization (including imaging, microbiology, ancillary and laboratory) are listed below for reference.    Significant Diagnostic Studies: ECHOCARDIOGRAM COMPLETE  Result Date: 09/16/2023    ECHOCARDIOGRAM REPORT   Patient Name:   Amy [name] [facility] Date of Exam: 09/15/2023 Medical Rec #:  [id number]         Height:       65.0 in Accession #:    [id number]        Weight:       144.0 lb Date of Birth:  [birth year]         BSA:          1.720 m Patient Age:    89 years          BP:           169/69 mmHg Patient Gender: F                 HR:           75 bpm. Exam Location:  [facility] Procedure: 2D Echo, Cardiac Doppler and Color Doppler Indications:     R06.00 Dyspnea  History:         Patient has prior history of Echocardiogram examinations, most                  recent 09/12/2016. Signs/Symptoms:Dyspnea; Risk                  Factors:Hypertension and Dyslipidemia.  Sonographer:     Daphine Deutscher RDCS  Referring Phys:  [id number] Wilfred Curtis SEGB Diagnosing Phys: Marcina Millard MD IMPRESSIONS  1. Left ventricular ejection fraction, by estimation, is 60 to 65%. The left ventricle has normal function. The left ventricle has no regional wall motion abnormalities. Left ventricular diastolic parameters are consistent with Grade I diastolic dysfunction (impaired relaxation).  2. Right ventricular systolic function is normal. The right ventricular size is normal.  3. The mitral valve is normal in structure. No evidence of mitral valve regurgitation. No evidence of mitral stenosis.  4. The aortic valve is normal in structure. Aortic valve regurgitation is trivial. No aortic stenosis is present.  5. The inferior vena cava is normal in size with greater than 50% respiratory variability, suggesting right atrial pressure of 3 mmHg. FINDINGS  Left Ventricle: Left ventricular ejection fraction, by estimation, is 60 to 65%. The left ventricle has normal function. The left ventricle has no regional wall motion abnormalities. The left ventricular internal cavity size was normal in size. There is  no left ventricular hypertrophy. Left ventricular diastolic parameters are consistent with Grade I diastolic dysfunction (impaired relaxation). Right Ventricle: The right ventricular size is normal. No increase in right ventricular wall thickness. Right ventricular systolic function is normal. Left Atrium: Left atrial size was normal in size. Right Atrium: Right atrial size was normal in size. Pericardium: There is no evidence of pericardial effusion. Mitral Valve: The mitral valve is normal in structure. No evidence of mitral valve regurgitation. No evidence of mitral valve stenosis. Tricuspid Valve: The tricuspid valve is normal in structure. Tricuspid valve regurgitation is mild . No evidence of tricuspid stenosis.  Contact information for after-discharge care     Destination     [facility] .   Service: Skilled Nursing Contact information: [address] [place] [phone]                      The results of significant diagnostics from this hospitalization (including imaging, microbiology, ancillary and laboratory) are listed below for reference.    Significant Diagnostic Studies: ECHOCARDIOGRAM COMPLETE  Result Date: 09/16/2023    ECHOCARDIOGRAM REPORT   Patient Name:   Amy [name] [facility] Date of Exam: 09/15/2023 Medical Rec #:  [id number]         Height:       65.0 in Accession #:    [id number]        Weight:       144.0 lb Date of Birth:  [birth year]         BSA:          1.720 m Patient Age:    89 years          BP:           169/69 mmHg Patient Gender: F                 HR:           75 bpm. Exam Location:  [facility] Procedure: 2D Echo, Cardiac Doppler and Color Doppler Indications:     R06.00 Dyspnea  History:         Patient has prior history of Echocardiogram examinations, most                  recent 09/12/2016. Signs/Symptoms:Dyspnea; Risk                  Factors:Hypertension and Dyslipidemia.  Sonographer:     Daphine Deutscher RDCS  Referring Phys:  [id number] Wilfred Curtis SEGB Diagnosing Phys: Marcina Millard MD IMPRESSIONS  1. Left ventricular ejection fraction, by estimation, is 60 to 65%. The left ventricle has normal function. The left ventricle has no regional wall motion abnormalities. Left ventricular diastolic parameters are consistent with Grade I diastolic dysfunction (impaired relaxation).  2. Right ventricular systolic function is normal. The right ventricular size is normal.  3. The mitral valve is normal in structure. No evidence of mitral valve regurgitation. No evidence of mitral stenosis.  4. The aortic valve is normal in structure. Aortic valve regurgitation is trivial. No aortic stenosis is present.  5. The inferior vena cava is normal in size with greater than 50% respiratory variability, suggesting right atrial pressure of 3 mmHg. FINDINGS  Left Ventricle: Left ventricular ejection fraction, by estimation, is 60 to 65%. The left ventricle has normal function. The left ventricle has no regional wall motion abnormalities. The left ventricular internal cavity size was normal in size. There is  no left ventricular hypertrophy. Left ventricular diastolic parameters are consistent with Grade I diastolic dysfunction (impaired relaxation). Right Ventricle: The right ventricular size is normal. No increase in right ventricular wall thickness. Right ventricular systolic function is normal. Left Atrium: Left atrial size was normal in size. Right Atrium: Right atrial size was normal in size. Pericardium: There is no evidence of pericardial effusion. Mitral Valve: The mitral valve is normal in structure. No evidence of mitral valve regurgitation. No evidence of mitral valve stenosis. Tricuspid Valve: The tricuspid valve is normal in structure. Tricuspid valve regurgitation is mild . No evidence of tricuspid stenosis.  Contact information for after-discharge care     Destination     [facility] .   Service: Skilled Nursing Contact information: [address] [place] [phone]                      The results of significant diagnostics from this hospitalization (including imaging, microbiology, ancillary and laboratory) are listed below for reference.    Significant Diagnostic Studies: ECHOCARDIOGRAM COMPLETE  Result Date: 09/16/2023    ECHOCARDIOGRAM REPORT   Patient Name:   Amy [name] [facility] Date of Exam: 09/15/2023 Medical Rec #:  [id number]         Height:       65.0 in Accession #:    [id number]        Weight:       144.0 lb Date of Birth:  [birth year]         BSA:          1.720 m Patient Age:    89 years          BP:           169/69 mmHg Patient Gender: F                 HR:           75 bpm. Exam Location:  [facility] Procedure: 2D Echo, Cardiac Doppler and Color Doppler Indications:     R06.00 Dyspnea  History:         Patient has prior history of Echocardiogram examinations, most                  recent 09/12/2016. Signs/Symptoms:Dyspnea; Risk                  Factors:Hypertension and Dyslipidemia.  Sonographer:     Daphine Deutscher RDCS  Referring Phys:  [id number] Wilfred Curtis SEGB Diagnosing Phys: Marcina Millard MD IMPRESSIONS  1. Left ventricular ejection fraction, by estimation, is 60 to 65%. The left ventricle has normal function. The left ventricle has no regional wall motion abnormalities. Left ventricular diastolic parameters are consistent with Grade I diastolic dysfunction (impaired relaxation).  2. Right ventricular systolic function is normal. The right ventricular size is normal.  3. The mitral valve is normal in structure. No evidence of mitral valve regurgitation. No evidence of mitral stenosis.  4. The aortic valve is normal in structure. Aortic valve regurgitation is trivial. No aortic stenosis is present.  5. The inferior vena cava is normal in size with greater than 50% respiratory variability, suggesting right atrial pressure of 3 mmHg. FINDINGS  Left Ventricle: Left ventricular ejection fraction, by estimation, is 60 to 65%. The left ventricle has normal function. The left ventricle has no regional wall motion abnormalities. The left ventricular internal cavity size was normal in size. There is  no left ventricular hypertrophy. Left ventricular diastolic parameters are consistent with Grade I diastolic dysfunction (impaired relaxation). Right Ventricle: The right ventricular size is normal. No increase in right ventricular wall thickness. Right ventricular systolic function is normal. Left Atrium: Left atrial size was normal in size. Right Atrium: Right atrial size was normal in size. Pericardium: There is no evidence of pericardial effusion. Mitral Valve: The mitral valve is normal in structure. No evidence of mitral valve regurgitation. No evidence of mitral valve stenosis. Tricuspid Valve: The tricuspid valve is normal in structure. Tricuspid valve regurgitation is mild . No evidence of tricuspid stenosis.  Contact information for after-discharge care     Destination     [facility] .   Service: Skilled Nursing Contact information: [address] [place] [phone]                      The results of significant diagnostics from this hospitalization (including imaging, microbiology, ancillary and laboratory) are listed below for reference.    Significant Diagnostic Studies: ECHOCARDIOGRAM COMPLETE  Result Date: 09/16/2023    ECHOCARDIOGRAM REPORT   Patient Name:   Amy [name] [facility] Date of Exam: 09/15/2023 Medical Rec #:  [id number]         Height:       65.0 in Accession #:    [id number]        Weight:       144.0 lb Date of Birth:  [birth year]         BSA:          1.720 m Patient Age:    89 years          BP:           169/69 mmHg Patient Gender: F                 HR:           75 bpm. Exam Location:  [facility] Procedure: 2D Echo, Cardiac Doppler and Color Doppler Indications:     R06.00 Dyspnea  History:         Patient has prior history of Echocardiogram examinations, most                  recent 09/12/2016. Signs/Symptoms:Dyspnea; Risk                  Factors:Hypertension and Dyslipidemia.  Sonographer:     Daphine Deutscher RDCS  Referring Phys:  [id number] Wilfred Curtis SEGB Diagnosing Phys: Marcina Millard MD IMPRESSIONS  1. Left ventricular ejection fraction, by estimation, is 60 to 65%. The left ventricle has normal function. The left ventricle has no regional wall motion abnormalities. Left ventricular diastolic parameters are consistent with Grade I diastolic dysfunction (impaired relaxation).  2. Right ventricular systolic function is normal. The right ventricular size is normal.  3. The mitral valve is normal in structure. No evidence of mitral valve regurgitation. No evidence of mitral stenosis.  4. The aortic valve is normal in structure. Aortic valve regurgitation is trivial. No aortic stenosis is present.  5. The inferior vena cava is normal in size with greater than 50% respiratory variability, suggesting right atrial pressure of 3 mmHg. FINDINGS  Left Ventricle: Left ventricular ejection fraction, by estimation, is 60 to 65%. The left ventricle has normal function. The left ventricle has no regional wall motion abnormalities. The left ventricular internal cavity size was normal in size. There is  no left ventricular hypertrophy. Left ventricular diastolic parameters are consistent with Grade I diastolic dysfunction (impaired relaxation). Right Ventricle: The right ventricular size is normal. No increase in right ventricular wall thickness. Right ventricular systolic function is normal. Left Atrium: Left atrial size was normal in size. Right Atrium: Right atrial size was normal in size. Pericardium: There is no evidence of pericardial effusion. Mitral Valve: The mitral valve is normal in structure. No evidence of mitral valve regurgitation. No evidence of mitral valve stenosis. Tricuspid Valve: The tricuspid valve is normal in structure. Tricuspid valve regurgitation is mild . No evidence of tricuspid stenosis.  Contact information for after-discharge care     Destination     [facility] .   Service: Skilled Nursing Contact information: [address] [place] [address] [phone]                      The results of significant diagnostics from this hospitalization (including imaging, microbiology, ancillary and laboratory) are listed below for reference.    Significant Diagnostic Studies: ECHOCARDIOGRAM COMPLETE  Result Date: 09/16/2023    ECHOCARDIOGRAM REPORT   Patient Name:   Amy [name] [facility] Date of Exam: 09/15/2023 Medical Rec #:  [id number]         Height:       65.0 in Accession #:    [id number]        Weight:       144.0 lb Date of Birth:  [birth year]         BSA:          1.720 m Patient Age:    89 years          BP:           169/69 mmHg Patient Gender: F                 HR:           75 bpm. Exam Location:  [facility] Procedure: 2D Echo, Cardiac Doppler and Color Doppler Indications:     R06.00 Dyspnea  History:         Patient has prior history of Echocardiogram examinations, most                  recent 09/12/2016. Signs/Symptoms:Dyspnea; Risk                  Factors:Hypertension and Dyslipidemia.  Sonographer:     Daphine Deutscher RDCS  Referring Phys:  [id number] Wilfred Curtis SEGB Diagnosing Phys: Marcina Millard MD IMPRESSIONS  1. Left ventricular ejection fraction, by estimation, is 60 to 65%. The left ventricle has normal function. The left ventricle has no regional wall motion abnormalities. Left ventricular diastolic parameters are consistent with Grade I diastolic dysfunction (impaired relaxation).  2. Right ventricular systolic function is normal. The right ventricular size is normal.  3. The mitral valve is normal in structure. No evidence of mitral valve regurgitation. No evidence of mitral stenosis.  4. The aortic valve is normal in structure. Aortic valve regurgitation is trivial. No aortic stenosis is present.  5. The inferior vena cava is normal in size with greater than 50% respiratory variability, suggesting right atrial pressure of 3 mmHg. FINDINGS  Left Ventricle: Left ventricular ejection fraction, by estimation, is 60 to 65%. The left ventricle has normal function. The left ventricle has no regional wall motion abnormalities. The left ventricular internal cavity size was normal in size. There is  no left ventricular hypertrophy. Left ventricular diastolic parameters are consistent with Grade I diastolic dysfunction (impaired relaxation). Right Ventricle: The right ventricular size is normal. No increase in right ventricular wall thickness. Right ventricular systolic function is normal. Left Atrium: Left atrial size was normal in size. Right Atrium: Right atrial size was normal in size. Pericardium: There is no evidence of pericardial effusion. Mitral Valve: The mitral valve is normal in structure. No evidence of mitral valve regurgitation. No evidence of mitral valve stenosis. Tricuspid Valve: The tricuspid valve is normal in structure. Tricuspid valve regurgitation is mild . No evidence of tricuspid stenosis.  Amy [name] ZOX:[id number] DOB: 01/04/1929 DOA: 09/14/2023  PCP: Danella Penton, MD  Admit date: 09/14/2023 Discharge date: 09/22/2023  Time spent: 35 minutes  Recommendations for Outpatient Follow-up:  Pcp f/u     Discharge Diagnoses:  Principal Problem:   Shortness of breath Active Problems:   Atelectasis, bilateral   CAD in native artery   Benign essential HTN   Mixed hyperlipidemia   Hx of deep vein thrombophlebitis of lower extremity   Leukocytosis   History of vertebral compression fracture   Weakness   Depression with anxiety   Discharge Condition: improved  Diet recommendation: dysphagia 3  Filed Weights   09/16/23 0345 09/17/23 0345  Weight: 65.6 kg 65.5 kg    History of present illness:  From admission h and p Ms. Halea Lieb is a 89 year old female with history of GERD, hypertension, depression, anxiety, CAD, who presents to the emergency department for chief concerns of worsening shortness of breath and cough.   At bedside, patient patient was able to tell me her name, age, location, current calendar year.   Patient is eating dinner and she states that the piece taste good.  She states she feels better here than she did at the facility.   Patient reports that she was short of breath and had increased cough.  She denies trauma to her person.  She reports that her bilateral shoulder joints hurt her and this happens sometimes.   Granddaughter at bedside states that she was endorsing some thoracic discomfort.  Patient states that this comfort is surrounding the area where she had the compression fracture with a cement fix.   Patient states she forgot all about that.     Patient is requesting to not go back to that facility.  She endorses generalized weakness.  She also endorses swelling of her lower extremity that is her baseline.  She reports that the swelling and the pain gets worse when she lays in bed all day.     She reports that the facility was  not nice to her and did not take good care of her.  They refused to turn off the TV stating that he was not their job. She reports that the facility refused to let her get out of bed and states that she has to stay in bed all day.  Hospital Course:  # Cough, possible aspiration pneumonia Symptoms possibly sequelae of recent pneumonia.  CTA no signs of PE. Does have bibasilar atelectasis/infiltrate that is likely contributing. No pulm edema to suggest overt chf but bnp is mildly elevated and there are small b/l pleural effusions. Troponins are negative. Wbc elevated but was on steroids at home. Covid negative. Rvp last week negative. Procal neg but does have productive cough so here we have elected to treat for aspiration pneumonia given aspiration risk. Now s/p 5 days unasyn and clinically much improved - continue incentive spirometer   # Hiatal hernia # Dysphagia Likely contributes to reflux and as such risk for aspiration - slp has cleared for dysphagia 3 diet   # Hypoxic respiratory failure 2/2 atelectasis, poss pneumonia as above. Weaned off o2 9/27 - monitor   # T11/12 compression fractures # Cervical stenosis S/p kyphoplasty in 2019, patient's main complaint is cervical/thoracic neck pain. Mri c/t spine stable from priors - lidocaine patch and duloxetine   # HTN BPs a bit labile but persistently up the last few days - resume home dilt  - continue home metop   # CAD

## 2023-09-22 NOTE — Plan of Care (Signed)
  Problem: Education: Goal: Knowledge of General Education information will improve Description: Including pain rating scale, medication(s)/side effects and non-pharmacologic comfort measures Outcome: Progressing   Problem: Activity: Goal: Risk for activity intolerance will decrease Outcome: Progressing   Problem: Nutrition: Goal: Adequate nutrition will be maintained Outcome: Progressing   Problem: Coping: Goal: Level of anxiety will decrease Outcome: Progressing   Problem: Elimination: Goal: Will not experience complications related to bowel motility Outcome: Progressing   Problem: Pain Managment: Goal: General experience of comfort will improve Outcome: Progressing   Problem: Skin Integrity: Goal: Risk for impaired skin integrity will decrease Outcome: Progressing   

## 2023-09-28 ENCOUNTER — Emergency Department: Payer: Medicare Other

## 2023-09-28 ENCOUNTER — Other Ambulatory Visit: Payer: Self-pay

## 2023-09-28 ENCOUNTER — Emergency Department
Admission: EM | Admit: 2023-09-28 | Discharge: 2023-09-29 | Disposition: A | Discharge status: Home / Self Care | Payer: Medicare Other | Attending: Emergency Medicine | Admitting: Emergency Medicine

## 2023-09-28 DIAGNOSIS — M25512 Pain in left shoulder: Secondary | ICD-10-CM | POA: Diagnosis not present

## 2023-09-28 DIAGNOSIS — R7989 Other specified abnormal findings of blood chemistry: Secondary | ICD-10-CM | POA: Insufficient documentation

## 2023-09-28 DIAGNOSIS — I2510 Atherosclerotic heart disease of native coronary artery without angina pectoris: Secondary | ICD-10-CM | POA: Diagnosis not present

## 2023-09-28 DIAGNOSIS — I10 Essential (primary) hypertension: Secondary | ICD-10-CM | POA: Insufficient documentation

## 2023-09-28 DIAGNOSIS — J90 Pleural effusion, not elsewhere classified: Secondary | ICD-10-CM

## 2023-09-28 DIAGNOSIS — R001 Bradycardia, unspecified: Secondary | ICD-10-CM | POA: Insufficient documentation

## 2023-09-28 DIAGNOSIS — R0602 Shortness of breath: Secondary | ICD-10-CM | POA: Insufficient documentation

## 2023-09-28 LAB — BASIC METABOLIC PANEL
Anion gap: 12 (ref 5–15)
BUN: 18 mg/dL (ref 8–23)
CO2: 25 mmol/L (ref 22–32)
Calcium: 8.9 mg/dL (ref 8.9–10.3)
Chloride: 104 mmol/L (ref 98–111)
Creatinine, Ser: 0.53 mg/dL (ref 0.44–1.00)
GFR, Estimated: >60 mL/min (ref >60)
Glucose, Bld: 110 mg/dL — ABNORMAL HIGH (ref 70–99)
Potassium: 3.7 mmol/L (ref 3.5–5.1)
Sodium: 141 mmol/L (ref 135–145)

## 2023-09-28 LAB — CBC
HCT: 35 % — ABNORMAL LOW (ref 36.0–46.0)
Hemoglobin: 11 g/dL — ABNORMAL LOW (ref 12.0–15.0)
MCH: 28.1 pg (ref 26.0–34.0)
MCHC: 31.4 g/dL (ref 30.0–36.0)
MCV: 89.5 fL (ref 80.0–100.0)
Platelets: 175 10*3/uL (ref 150–400)
RBC: 3.91 MIL/uL (ref 3.87–5.11)
RDW: 15.8 % — ABNORMAL HIGH (ref 11.5–15.5)
WBC: 7.1 10*3/uL (ref 4.0–10.5)
nRBC: 0 % (ref 0.0–0.2)

## 2023-09-28 LAB — TROPONIN I (HIGH SENSITIVITY): Troponin I (High Sensitivity): 5 ng/L (ref <18)

## 2023-09-28 LAB — BRAIN NATRIURETIC PEPTIDE: B Natriuretic Peptide: 382.1 pg/mL — ABNORMAL HIGH (ref 0.0–100.0)

## 2023-09-28 NOTE — ED Provider Notes (Signed)
11:30 PM  Assumed care at shift change.  Patient here with shoulder pain and SOB.  CXR and BNP pending.  Troponin negative.  1:23 AM  Pt's BNP is slightly elevated at 382 and chest x-ray when reviewed and interpreted by myself and radiologist shows small bilateral pleural effusions.  Given complaints of shortness of breath, will restart her torsemide for the next 4 days.  It was recently stopped during her last hospitalization.  As for her shoulder pain, I suspect this is a strain from her physical therapy.  Recommended Tylenol as needed.  Recommend that her physical therapy regimen be reassessed by her primary care provider.  Family comfortable with plan for discharge back to [facility].   Amy [name], Layla Maw, DO 09/29/23 [phone]

## 2023-09-28 NOTE — ED Provider Notes (Signed)
[facility] Provider Note    Event Date/Time   First MD Initiated Contact with Patient 09/28/23 2226     (approximate)   History   Bradycardia and Shoulder Pain (Left)   HPI  Amy [name] is a 89 y.o. female with a history of hypertension, CAD, GERD, anxiety, and depression who presents with shoulder pain and an episode of shortness of breath.  The caregivers report that the patient was doing some physical therapy including arm raises yesterday.  Today she reported left shoulder pain although she is able to move it without difficulty.  She got Tylenol this morning which helped, but then in the afternoon and started having shoulder pain again radiating to the right side of her chest.  This was around 4 PM.  At the same time she started to feel short of breath, had increased respiratory rate, and she was noted to have a heart rate in the 50s on a pulse oximeter.  The symptoms have now resolved.  The patient denies any chest pain or shortness of breath.  She is still has pain in the left shoulder.  She denies any acute trauma to the shoulder.  She has no cough or fever.  She has no leg swelling.  Reviewed the past medical records.  The patient was admitted at the end of last month with shortness of breath and cough.  She was thought to have possible aspiration pneumonia and atelectasis.   Physical Exam   Triage Vital Signs: ED Triage Vitals  Encounter Vitals Group     BP 09/28/23 1957 (!) 154/66     Systolic BP Percentile --      Diastolic BP Percentile --      Pulse Rate 09/28/23 1957 (!) 55     Resp 09/28/23 1957 16     Temp 09/28/23 1957 98 F (36.7 C)     Temp Source 09/28/23 1957 Oral     SpO2 09/28/23 1957 95 %     Weight 09/28/23 1958 144 lb 6.4 oz (65.5 kg)     Height 09/28/23 1958 5\' 5"  (1.651 m)     Head Circumference --      Peak Flow --      Pain Score --      Pain Loc --      Pain Education --      Exclude from Growth Chart --      Most recent vital signs: Vitals:   09/28/23 1957 09/28/23 2224  BP: (!) 154/66 (!) 148/71  Pulse: (!) 55 (!) 57  Resp: 16 (!) 23  Temp: 98 F (36.7 C)   SpO2: 95% 94%     General: Alert, comfortable appearing, no distress.  CV:  Good peripheral perfusion.  Resp:  Normal effort.  Lungs CTAB. Abd:  No distention.  Other:  No significant peripheral edema.  Full range of motion of left shoulder with mild tenderness anteriorly.  No deformity.   ED Results / Procedures / Treatments   Labs (all labs ordered are listed, but only abnormal results are displayed) Labs Reviewed  BASIC METABOLIC PANEL - Abnormal; Notable for the following components:      Result Value   Glucose, Bld 110 (*)    All other components within normal limits  CBC - Abnormal; Notable for the following components:   Hemoglobin 11.0 (*)    HCT 35.0 (*)    RDW 15.8 (*)    All other components within normal limits  BRAIN NATRIURETIC PEPTIDE - Abnormal; Notable for the following components:   B Natriuretic Peptide 382.1 (*)    All other components within normal limits  TROPONIN I (HIGH SENSITIVITY)     EKG  ED ECG REPORT I, Dionne Bucy, the attending physician, personally viewed and interpreted this ECG.  Date: 09/28/2023 EKG Time: 2010 Rate: 53 Rhythm: Sinus bradycardia QRS Axis: normal Intervals: normal ST/T Wave abnormalities: normal Narrative Interpretation: no evidence of acute ischemia    RADIOLOGY  XR L shoulder: I independently viewed and interpreted the images; there is no acute fracture.  Radiology report indicates no acute abnormality.   PROCEDURES:  Critical Care performed: No  Procedures   MEDICATIONS ORDERED IN ED: Medications - No data to display   IMPRESSION / MDM / ASSESSMENT AND PLAN / ED COURSE  I reviewed the triage vital signs and the nursing notes.  89 year old female presents with atraumatic left shoulder pain after doing some exercises yesterday  and also had an episode in which she became short of breath but this has now resolved.  The caregiver had a pulse oximeter on her during that time and  noted that her O2 sat briefly went down to the 80s.  Currently on exam the patient is well-appearing, her vital signs are normal except for borderline bradycardia, and she has no respiratory distress or increased work of breathing.  Left shoulder demonstrates full range of motion with only mild pain.  Differential diagnosis includes, but is not limited to, older strain, arthritis, degenerative joint disease.  The shortness of breath could have been due to acute pain.  There is no evidence of ACS or other cardiac cause.  The family is somewhat concerned about CHF and wanted the patient checked for this.  Shoulder x-ray is negative.  Troponin is negative.  Given that this was drawn about 4 hours since the symptoms started, there is no indication for a repeat.  EKG is nonischemic.  BMP and CBC are unremarkable.  I have added on a BNP and chest x-ray.  Patient's presentation is most consistent with acute complicated illness / injury requiring diagnostic workup.  The patient is on the cardiac monitor to evaluate for evidence of arrhythmia and/or significant heart rate changes.  ----------------------------------------- 11:31 PM on 09/28/2023 -----------------------------------------  BNP is only minimally elevated.  Chest x-ray is pending.  I have signed the patient out to the oncoming ED physician.  Anticipate discharge home if the chest x-ray is reassuring.   FINAL CLINICAL IMPRESSION(S) / ED DIAGNOSES   Final diagnoses:  Acute pain of left shoulder  Shortness of breath     Rx / DC Orders   ED Discharge Orders     None        Note:  This document was prepared using Dragon voice recognition software and may include unintentional dictation errors.    Dionne Bucy, MD 09/28/23 2332

## 2023-09-28 NOTE — ED Notes (Signed)
PER EMS: pt is from [facility] with c/o HR of 57. She takes Cardizem. She also reports left shoulder pain that started after physical therapy today. She is non-ambulatory, hx of dementia and mentation is currently at baseline per EMS.   BP- 151/60, HR-57, 92% RA.

## 2023-09-28 NOTE — ED Triage Notes (Addendum)
Pt to ed from [facility] via [facility] for low heart rate and shoulder pain in the left shoulder x 1 week per pt. Pt is alert and oriented to baseline per family and caregiver. Pt did take 1000mg  of tylenol this morning and that relieved her pain But this evening she became anxious about the pain. Per family she is at [facility] for rehab due to having pneumonia for two weeks and becoming gradually more week.

## 2023-09-29 MED ORDER — TORSEMIDE 10 MG PO TABS: 10.0000 mg | ORAL_TABLET | Freq: Every day | ORAL | 0 refills | Status: DC

## 2023-09-29 NOTE — ED Notes (Signed)
Called [facility], spoke with rep. Wynonia Musty she stated the pt. Is next on the list and a truck will be on the way.

## 2023-09-29 NOTE — Discharge Instructions (Addendum)
Please take torsemide 10 mg once daily for the next 4 days to help with small bilateral pleural effusions..  I recommend that her primary care provider reassess physical therapy due to injuries that occurred during physical therapy with muscle strain.  X-ray showed no fracture today.  She may take Tylenol 1000 mg every 6 hours as needed for pain.

## 2023-10-03 ENCOUNTER — Other Ambulatory Visit: Payer: Self-pay

## 2023-10-03 ENCOUNTER — Emergency Department: Payer: Medicare Other

## 2023-10-03 ENCOUNTER — Inpatient Hospital Stay
Admission: EM | Admit: 2023-10-03 | Discharge: 2023-10-06 | DRG: 193 | Disposition: A | Discharge status: Skilled Nursing Facility | Payer: Medicare Other | Source: Skilled Nursing Facility | Attending: Student in an Organized Health Care Education/Training Program | Admitting: Student in an Organized Health Care Education/Training Program

## 2023-10-03 DIAGNOSIS — I2510 Atherosclerotic heart disease of native coronary artery without angina pectoris: Secondary | ICD-10-CM | POA: Diagnosis present

## 2023-10-03 DIAGNOSIS — Z7982 Long term (current) use of aspirin: Secondary | ICD-10-CM | POA: Diagnosis not present

## 2023-10-03 DIAGNOSIS — Z66 Do not resuscitate: Secondary | ICD-10-CM | POA: Diagnosis present

## 2023-10-03 DIAGNOSIS — K219 Gastro-esophageal reflux disease without esophagitis: Secondary | ICD-10-CM | POA: Diagnosis present

## 2023-10-03 DIAGNOSIS — F0390 Unspecified dementia without behavioral disturbance: Secondary | ICD-10-CM | POA: Diagnosis present

## 2023-10-03 DIAGNOSIS — Z1152 Encounter for screening for COVID-19: Secondary | ICD-10-CM

## 2023-10-03 DIAGNOSIS — J189 Pneumonia, unspecified organism: Secondary | ICD-10-CM | POA: Diagnosis present

## 2023-10-03 DIAGNOSIS — I10 Essential (primary) hypertension: Secondary | ICD-10-CM | POA: Diagnosis not present

## 2023-10-03 DIAGNOSIS — Z79899 Other long term (current) drug therapy: Secondary | ICD-10-CM | POA: Diagnosis not present

## 2023-10-03 DIAGNOSIS — I340 Nonrheumatic mitral (valve) insufficiency: Secondary | ICD-10-CM | POA: Diagnosis present

## 2023-10-03 DIAGNOSIS — E785 Hyperlipidemia, unspecified: Secondary | ICD-10-CM | POA: Diagnosis present

## 2023-10-03 DIAGNOSIS — Z90710 Acquired absence of both cervix and uterus: Secondary | ICD-10-CM

## 2023-10-03 DIAGNOSIS — Z8249 Family history of ischemic heart disease and other diseases of the circulatory system: Secondary | ICD-10-CM

## 2023-10-03 DIAGNOSIS — J9601 Acute respiratory failure with hypoxia: Secondary | ICD-10-CM | POA: Diagnosis present

## 2023-10-03 DIAGNOSIS — Z882 Allergy status to sulfonamides status: Secondary | ICD-10-CM

## 2023-10-03 DIAGNOSIS — I5033 Acute on chronic diastolic (congestive) heart failure: Secondary | ICD-10-CM | POA: Diagnosis present

## 2023-10-03 DIAGNOSIS — I509 Heart failure, unspecified: Secondary | ICD-10-CM | POA: Diagnosis not present

## 2023-10-03 DIAGNOSIS — I110 Hypertensive heart disease with heart failure: Secondary | ICD-10-CM | POA: Diagnosis present

## 2023-10-03 DIAGNOSIS — Z881 Allergy status to other antibiotic agents status: Secondary | ICD-10-CM | POA: Diagnosis not present

## 2023-10-03 LAB — RESP PANEL BY RT-PCR (RSV, FLU A&B, COVID)  RVPGX2
Influenza A by PCR: NEGATIVE
Influenza B by PCR: NEGATIVE
Resp Syncytial Virus by PCR: NEGATIVE
SARS Coronavirus 2 by RT PCR: NEGATIVE

## 2023-10-03 LAB — COMPREHENSIVE METABOLIC PANEL
ALT: 14 U/L (ref 0–44)
AST: 15 U/L (ref 15–41)
Albumin: 3.3 g/dL — ABNORMAL LOW (ref 3.5–5.0)
Alkaline Phosphatase: 85 U/L (ref 38–126)
Anion gap: 11 (ref 5–15)
BUN: 15 mg/dL (ref 8–23)
CO2: 30 mmol/L (ref 22–32)
Calcium: 8.8 mg/dL — ABNORMAL LOW (ref 8.9–10.3)
Chloride: 100 mmol/L (ref 98–111)
Creatinine, Ser: 0.77 mg/dL (ref 0.44–1.00)
GFR, Estimated: >60 mL/min (ref >60)
Glucose, Bld: 112 mg/dL — ABNORMAL HIGH (ref 70–99)
Potassium: 3.9 mmol/L (ref 3.5–5.1)
Sodium: 141 mmol/L (ref 135–145)
Total Bilirubin: 1 mg/dL (ref 0.3–1.2)
Total Protein: 6.5 g/dL (ref 6.5–8.1)

## 2023-10-03 LAB — CBC WITH DIFFERENTIAL/PLATELET
Abs Immature Granulocytes: 0.04 10*3/uL (ref 0.00–0.07)
Basophils Absolute: 0.1 10*3/uL (ref 0.0–0.1)
Basophils Relative: 1 %
Eosinophils Absolute: 0.6 10*3/uL — ABNORMAL HIGH (ref 0.0–0.5)
Eosinophils Relative: 6 %
HCT: 39.5 % (ref 36.0–46.0)
Hemoglobin: 12.1 g/dL (ref 12.0–15.0)
Immature Granulocytes: 0 %
Lymphocytes Relative: 22 %
Lymphs Abs: 2 10*3/uL (ref 0.7–4.0)
MCH: 27.9 pg (ref 26.0–34.0)
MCHC: 30.6 g/dL (ref 30.0–36.0)
MCV: 91.2 fL (ref 80.0–100.0)
Monocytes Absolute: 0.5 10*3/uL (ref 0.1–1.0)
Monocytes Relative: 5 %
Neutro Abs: 6 10*3/uL (ref 1.7–7.7)
Neutrophils Relative %: 66 %
Platelets: 216 10*3/uL (ref 150–400)
RBC: 4.33 MIL/uL (ref 3.87–5.11)
RDW: 15.9 % — ABNORMAL HIGH (ref 11.5–15.5)
WBC: 9.2 10*3/uL (ref 4.0–10.5)
nRBC: 0 % (ref 0.0–0.2)

## 2023-10-03 LAB — TROPONIN I (HIGH SENSITIVITY)
Troponin I (High Sensitivity): 9 ng/L (ref <18)
Troponin I (High Sensitivity): 9 ng/L (ref <18)

## 2023-10-03 LAB — PROCALCITONIN: Procalcitonin: <0.1 ng/mL

## 2023-10-03 LAB — BRAIN NATRIURETIC PEPTIDE: B Natriuretic Peptide: 308.4 pg/mL — ABNORMAL HIGH (ref 0.0–100.0)

## 2023-10-03 MED ORDER — ENOXAPARIN SODIUM 40 MG/0.4ML IJ SOSY
40.0000 mg | PREFILLED_SYRINGE | INTRAMUSCULAR | Status: DC
  Administered 2023-10-03 – 2023-10-06 (×4): 40 mg via SUBCUTANEOUS
  Filled 2023-10-03 (×4): qty 0.4

## 2023-10-03 MED ORDER — SODIUM CHLORIDE 0.9 % IV SOLN
500.0000 mg | INTRAVENOUS | Status: DC
  Administered 2023-10-03 – 2023-10-04 (×2): 500 mg via INTRAVENOUS
  Filled 2023-10-03 (×3): qty 5

## 2023-10-03 MED ORDER — QUETIAPINE FUMARATE 25 MG PO TABS
50.0000 mg | ORAL_TABLET | Freq: Every day | ORAL | Status: DC
  Administered 2023-10-03 – 2023-10-05 (×3): 50 mg via ORAL
  Filled 2023-10-03 (×3): qty 2

## 2023-10-03 MED ORDER — MELATONIN 5 MG PO TABS
5.0000 mg | ORAL_TABLET | Freq: Every day | ORAL | Status: DC
  Administered 2023-10-03 – 2023-10-05 (×3): 5 mg via ORAL
  Filled 2023-10-03 (×3): qty 1

## 2023-10-03 MED ORDER — FUROSEMIDE 40 MG PO TABS
40.0000 mg | ORAL_TABLET | Freq: Every day | ORAL | Status: DC
  Administered 2023-10-03 – 2023-10-06 (×4): 40 mg via ORAL
  Filled 2023-10-03 (×4): qty 1

## 2023-10-03 MED ORDER — IOHEXOL 350 MG/ML SOLN
75.0000 mL | Freq: Once | INTRAVENOUS | Status: AC | PRN
  Administered 2023-10-03: 75 mL via INTRAVENOUS

## 2023-10-03 MED ORDER — FUROSEMIDE 10 MG/ML IJ SOLN
40.0000 mg | Freq: Once | INTRAMUSCULAR | Status: AC
  Administered 2023-10-03: 40 mg via INTRAVENOUS
  Filled 2023-10-03: qty 4

## 2023-10-03 MED ORDER — SODIUM CHLORIDE 0.9 % IV SOLN
2.0000 g | INTRAVENOUS | Status: DC
  Administered 2023-10-03 – 2023-10-06 (×4): 2 g via INTRAVENOUS
  Filled 2023-10-03 (×4): qty 20

## 2023-10-03 NOTE — ED Notes (Signed)
Warm blanket given

## 2023-10-03 NOTE — ED Provider Notes (Signed)
[facility] Provider Note    Event Date/Time   First MD Initiated Contact with Patient 10/03/23 [phone]     (approximate)   History   Shortness of Breath   HPI  Amy [name] is a 89 y.o. female with recent admission for pneumonia who comes in with concerns for low oxygen levels.  I reviewed the hospital admission from 9/22 where patient was admitted for pneumonia.  Patient is at [facility] where they reported they could not get an oxygen level on her so they called EMS.  With EMS her oxygen levels were normal.  She does have new nail polish that was just placed onto her nails.  Patient herself denies any shortness of breath fevers, coughs or any other concerns.  She states she is not sure why she is here given she states that she feels completely fine  I called the facility who reported that she is not typically on oxygen but yesterday she desatted down to 88% so placed on 2 L and overnight she had to go up to 3 L.  Then this morning she was satting 76% on 3 L so had to be increased to 5 L.  She recently complained of any shortness of breath with a new report of these were good waveforms therefore they brought her in to be evaluated.      Physical Exam   Triage Vital Signs: ED Triage Vitals  Encounter Vitals Group     BP --      Systolic BP Percentile --      Diastolic BP Percentile --      Pulse --      Resp --      Temp --      Temp src --      SpO2 --      Weight 10/03/23 0846 183 lb 9.6 oz (83.3 kg)     Height --      Head Circumference --      Peak Flow --      Pain Score 10/03/23 0843 0     Pain Loc --      Pain Education --      Exclude from Growth Chart --     Most recent vital signs: Vitals:   10/03/23 0847 10/03/23 0849  BP: (!) 164/58   Pulse: 69   Temp:  99.3 F (37.4 C)  SpO2: 92%      General: Awake, no distress.  CV:  Good peripheral perfusion.  Resp:  Normal effort.  Clear lungs Abd:  No distention.   Other:  No significant swelling noted.  No tenderness in the calfs   ED Results / Procedures / Treatments   Labs (all labs ordered are listed, but only abnormal results are displayed) Labs Reviewed  RESP PANEL BY RT-PCR (RSV, FLU A&B, COVID)  [id number]  CBC WITH DIFFERENTIAL/PLATELET  COMPREHENSIVE METABOLIC PANEL  BRAIN NATRIURETIC PEPTIDE  TROPONIN I (HIGH SENSITIVITY)     EKG  My interpretation of EKG:  Normal sinus rhythm 65 without any ST elevation, T wave version in lead III, normal intervals  T wave inversion is similar to prior  RADIOLOGY I have reviewed the xray personally and interpretted mild pleural effusions  PROCEDURES:  Critical Care performed: Yes, see critical care procedure note(s)  .1-3 Lead EKG Interpretation  Performed by: Concha Se, MD Authorized by: Concha Se, MD     Interpretation: normal     ECG rate:  60   ECG rate assessment: normal     Rhythm: sinus rhythm     Ectopy: none     Conduction: normal   .Critical Care  Performed by: Concha Se, MD Authorized by: Concha Se, MD   Critical care provider statement:    Critical care time (minutes):  30   Critical care was necessary to treat or prevent imminent or life-threatening deterioration of the following conditions:  Respiratory failure   Critical care was time spent personally by me on the following activities:  Development of treatment plan with patient or surrogate, discussions with consultants, evaluation of patient's response to treatment, examination of patient, ordering and review of laboratory studies, ordering and review of radiographic studies, ordering and performing treatments and interventions, pulse oximetry, re-evaluation of patient's condition and review of old charts    MEDICATIONS ORDERED IN ED: Medications  furosemide (LASIX) injection 40 mg (40 mg Intravenous Given 10/03/23 1156)  iohexol (OMNIPAQUE) 350 MG/ML injection 75 mL (75 mLs Intravenous Contrast  Given 10/03/23 1039)     IMPRESSION / MDM / ASSESSMENT AND PLAN / ED COURSE  I reviewed the triage vital signs and the nursing notes.   Patient's presentation is most consistent with acute presentation with potential threat to life or bodily function.   Patient comes in with concern for hypoxia although she is overall well-appearing and asymptomatic however patient sats went down to 87 to 88% while in the room with a good waveform therefore patient was placed on 2 L of oxygen.  Will get workup including to evaluate for CHF, pneumonia, COVID.  No swelling in legs, calf tenderness to suggest DVT.  COVID test was not negative.  CBC reassuring CMP reassuring troponin is negative BNP slightly elevated.  Chest x-ray with similar effusions given her new hypoxia and her leg swelling however I suspect that she has underlying CHF given recent hospitalization will get CT PE rule out pulmonary embolism.  They did report her having some mild confusion that seems to be improved.  They are adamant she is not having falls at some has been with her at the facility 24/7 so we will add on CT head just to make sure there is no other emergent pathology.  I did discuss possible team for admission given new oxygen requirement , CTs are pending   The patient is on the cardiac monitor to evaluate for evidence of arrhythmia and/or significant heart rate changes.      FINAL CLINICAL IMPRESSION(S) / ED DIAGNOSES   Final diagnoses:  Acute respiratory failure with hypoxia (HCC)  Acute on chronic congestive heart failure, unspecified heart failure type (HCC)     Rx / DC Orders   ED Discharge Orders     None        Note:  This document was prepared using Dragon voice recognition software and may include unintentional dictation errors.   Concha Se, MD 10/03/23 [phone]

## 2023-10-03 NOTE — Assessment & Plan Note (Addendum)
2D echo September 2024 with EF of 50 to 55% and grade 1 diastolic dysfunction Appears clinically volume overloaded BNP of 300 CT imaging with pleural effusions and pulmonary edema IV diuresis Strict ins and outs and daily weights  Monitor volume status Cardiology consult as clinically indicated

## 2023-10-03 NOTE — ED Triage Notes (Signed)
Pt to ED [facility] from [facility] for hypoxia. Unknown RA SpO2. Denies shob. Speaking in complete sentences, NAD noted Pt current RA SpO2 98%

## 2023-10-03 NOTE — Assessment & Plan Note (Signed)
Stable Continue home Seroquel

## 2023-10-03 NOTE — Assessment & Plan Note (Signed)
BP stable Titrate home regimen 

## 2023-10-03 NOTE — Assessment & Plan Note (Signed)
Noted decompensated respiratory failure requiring 2 L nasal cannula with concern for pulmonary edema versus infiltrate on CT of the chest Admission around the end of September for similar issues with concern for?  Aspiration pneumonia Afebrile with no white count at present Will give coverage with IV Rocephin azithromycin given hypoxia on presentation Noted food in esophagus-may be an element of recurrent aspiration  Narrow/deescalate  abx as appropriate  Blood and resp culture  Monitor

## 2023-10-03 NOTE — ED Notes (Signed)
Pt SpO2 desat to 89-90%. Dr Fuller Plan notified. Placed on 2 L [place]

## 2023-10-03 NOTE — Assessment & Plan Note (Signed)
Decompensated respiratory failure now requiring 2 L with concern for volume overload BNP 300 CTA negative PE however showing pulmonary edema as well as?  Infiltrate Noted admission September 22 through September 30 for hypoxia with associated?  Aspiration pneumonia and trace volume overload Will place on IV diuresis IV Rocephin and azithromycin for potential pneumonia coverage Continue supplemental oxygen Blood and respiratory cultures Monitor

## 2023-10-03 NOTE — H&P (Addendum)
History and Physical    Patient: Amy [name]:[id number] DOB: Aug 03, 1929 DOA: 10/03/2023 DOS: the patient was seen and examined on 10/03/2023 PCP: Danella Penton, MD  Patient coming from:  Rehab  Chief Complaint:  Chief Complaint  Patient presents with   Shortness of Breath   HPI: Amy [name] is a 89 y.o. female with medical history significant of GERD, hypertension, depression, anxiety, CAD presenting with acute respiratory failure with hypoxia.  History primarily from family as well as patient's primary caregiver.  Per report patient with increased work of breathing and shortness of breath over the past 1 to 2 days.  Noted to have been admitted in the hospital at the end of September for similar symptoms.  Had formal diagnosis of aspiration pneumonia with?  Volume overload.  Was discharged on Lasix.  However, family unsure if patient was getting on a regular basis.  No reported high salt or NSAID use.  No fevers or chills.  No reported cough or wheezing.  Was noted to be hypoxic at [facility] care.  Per the family, has been otherwise compliant with discharge regimen. Presented to the ER afebrile, hemodynamically stable.  O2 sats in the low 90s to mid 80s.  Transition to 2 L nasal cannula to keep O2 sats greater than 99%.  White count 9.2, hemoglobin 12, platelets 216, creatinine 0.77, BNP 308.  CTA negative for PE, small bilateral pleural effusions but concern for possible bibasilar atelectasis versus consolidation.  Positive pulmonary edema.  Esophagus is filled with fluid and debris. Review of Systems: As mentioned in the history of present illness. All other systems reviewed and are negative. Past Medical History:  Diagnosis Date   Arthritis    Depression    Diverticulitis    DVT (deep venous thrombosis) (HCC)    Right leg    Dyspnea    Family history of adverse reaction to anesthesia    PONV sister   GERD (gastroesophageal reflux disease)    History of hiatal  hernia    Hyperlipidemia    Hypertension    Mitral valve regurgitation    Neuromuscular disorder (HCC)    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HCC)    Pneumonia    Renal disorder    Past Surgical History:  Procedure Laterality Date   ABDOMINAL HYSTERECTOMY     partial   ABDOMINAL SURGERY     bladder tack  1990   BREAST BIOPSY  1974   CATARACT EXTRACTION, BILATERAL     CERVICAL SPINE SURGERY     tumor removed   HERNIA REPAIR     KYPHOPLASTY N/A 03/12/2018   Procedure: [id number];  Surgeon: Kennedy Bucker, MD;  Location: [facility] ORS;  Service: Orthopedics;  Laterality: N/A;   KYPHOPLASTY N/A 06/30/2018   Procedure: Shauna Hugh;  Surgeon: Kennedy Bucker, MD;  Location: [facility] ORS;  Service: Orthopedics;  Laterality: N/A;   plasmacytoma resection  2005   lumbar   Social History:  reports that she has never smoked. She has never used smokeless tobacco. She reports that she does not drink alcohol and does not use drugs.  Allergies  Allergen Reactions   Doxycycline Hyclate Nausea Only   Levofloxacin Nausea And Vomiting   Sulfa Antibiotics Rash    Other Reaction: Throat feels funny    Family History  Problem Relation Age of Onset   Heart failure Mother    Heart failure Father    COPD Sister    Prostate cancer Brother    COPD Brother  Prior to Admission medications   Medication Sig Start Date End Date Taking? Authorizing Provider  Acetaminophen 500 MG capsule Take 1,000 mg by mouth 3 (three) times daily.   Yes [provider]  aspirin EC 81 MG tablet Take 1 tablet by mouth daily.   Yes [provider]  CALMOSEPTINE 0.44-20.6 % OINT Apply 1 Application topically in the morning and at bedtime. With every shift apply moderate amount to sacral areas and groin 09/22/23  Yes [provider]  COLD & HOT PLUS MENTHOL 4-1 % PTCH Apply 1 patch topically daily. 09/13/23  Yes [provider]  diltiazem (CARDIZEM CD) 120 MG 24 hr capsule Take 120 mg by mouth daily. 08/18/23  08/17/24 Yes [provider]  DULoxetine (CYMBALTA) 20 MG capsule Take 20 mg by mouth at bedtime. 07/27/23  Yes [provider]  esomeprazole (NEXIUM) 40 MG capsule Take 40 mg by mouth daily. 03/03/23  Yes [provider]  guaiFENesin-dextromethorphan (ROBITUSSIN DM) 100-10 MG/5ML syrup Take 5 mLs by mouth every 4 (four) hours as needed for cough. 09/12/23  Yes Arnetha Courser, MD  lidocaine (LIDODERM) 5 % Place 1 patch onto the skin daily. Remove & Discard patch within 12 hours or as directed by MD 09/22/23  Yes Wouk, Wilfred Curtis, MD  melatonin 5 MG TABS Take 0.5 tablets (2.5 mg total) by mouth at bedtime. 09/12/23  Yes Arnetha Courser, MD  metoprolol succinate (TOPROL-XL) 25 MG 24 hr tablet Take 25 mg by mouth in the morning and at bedtime. 07/12/23  Yes [provider]  Multiple Vitamins-Minerals (PRESERVISION AREDS PO) Take by mouth.   Yes [provider]  ondansetron (ZOFRAN) 4 MG tablet Take 4 mg by mouth every 8 (eight) hours as needed for nausea or vomiting.   Yes [provider]  polyethylene glycol (MIRALAX / GLYCOLAX) 17 g packet Take 17 g by mouth daily. 09/23/23  Yes Wouk, Wilfred Curtis, MD  QUEtiapine (SEROQUEL) 25 MG tablet Take 25 mg by mouth daily.   Yes [provider]  QUEtiapine (SEROQUEL) 50 MG tablet Take 50 mg by mouth at bedtime. 07/27/23  Yes [provider]  torsemide (DEMADEX) 10 MG tablet Take 1 tablet (10 mg total) by mouth daily for 4 days. 09/29/23 10/03/23 Yes Ward, Layla Maw, DO  hydrOXYzine (ATARAX) 25 MG tablet Take 1 tablet (25 mg total) by mouth 3 (three) times daily as needed for anxiety. 09/12/23   Arnetha Courser, MD    Physical Exam: Vitals:   10/03/23 1150 10/03/23 1200 10/03/23 1205 10/03/23 1223  BP: (!) 179/60 (!) 177/60  (!) 155/103  Pulse: (!) 59 (!) 58  65  Resp: (!) 21 19  19   Temp:   98.7 F (37.1 C) 98.4 F (36.9 C)  TempSrc:   Oral Oral  SpO2: 100% 99%  100%  Weight:       Physical  Exam Constitutional:      Appearance: She is obese.  HENT:     Head: Normocephalic and atraumatic.     Nose: Nose normal.     Mouth/Throat:     Mouth: Mucous membranes are moist.  Eyes:     Pupils: Pupils are equal, round, and reactive to light.  Cardiovascular:     Rate and Rhythm: Normal rate and regular rhythm.  Pulmonary:     Effort: Pulmonary effort is normal.  Abdominal:     General: Bowel sounds are normal.  Musculoskeletal:     Cervical back: Normal range of  motion.     Right lower leg: Edema present.     Left lower leg: Edema present.  Skin:    General: Skin is warm.  Neurological:     General: No focal deficit present.  Psychiatric:        Mood and Affect: Mood normal.     Data Reviewed:  There are no new results to review at this time. CT HEAD WO CONTRAST ( ) CLINICAL DATA:  Memory loss  EXAM: CT HEAD WITHOUT CONTRAST  TECHNIQUE: Contiguous axial images were obtained from the base of the skull through the vertex without intravenous contrast.  RADIATION DOSE REDUCTION: This exam was performed according to the departmental dose-optimization program which includes automated exposure control, adjustment of the mA and/or kV according to patient size and/or use of iterative reconstruction technique.  COMPARISON:  09/08/2023  FINDINGS: Brain: No evidence of acute infarction, hemorrhage, hydrocephalus, extra-axial collection or mass lesion/mass effect. Scattered low-density changes within the periventricular and subcortical white matter most compatible with chronic microvascular ischemic change. Moderate diffuse cerebral volume loss.  Vascular: Atherosclerotic calcifications involving the large vessels of the skull base. No unexpected hyperdense vessel.  Skull: Normal. Negative for fracture or focal lesion.  Sinuses/Orbits: Ongoing left mastoid effusion.  Other: None.  IMPRESSION: 1. No acute intracranial findings. 2. Chronic microvascular  ischemic change and cerebral volume loss. 3. Ongoing left mastoid effusion.  Electronically Signed   By: Duanne Guess D.O.   On: 10/03/2023 12:30 CT Angio Chest PE W and/or Wo Contrast CLINICAL DATA:  Hypoxia. Pulmonary embolism (PE) suspected, high prob  EXAM: CT ANGIOGRAPHY CHEST WITH CONTRAST  TECHNIQUE: Multidetector CT imaging of the chest was performed using the standard protocol during bolus administration of intravenous contrast. Multiplanar CT image reconstructions and MIPs were obtained to evaluate the vascular anatomy.  RADIATION DOSE REDUCTION: This exam was performed according to the departmental dose-optimization program which includes automated exposure control, adjustment of the mA and/or kV according to patient size and/or use of iterative reconstruction technique.  CONTRAST:  75mL OMNIPAQUE IOHEXOL 350 MG/ML SOLN  COMPARISON:  09/14/2023  FINDINGS: Cardiovascular: Satisfactory opacification of the pulmonary arteries to the segmental level. No evidence of pulmonary embolism. Thoracic aorta is nonaneurysmal. Atherosclerotic calcifications of the aorta and coronary arteries. Mild cardiomegaly. No pericardial effusion.  Mediastinum/Nodes: Previously seen enlarged subcarinal lymph node is decreased in size now measuring 9 mm, previously 14 mm. No axillary, mediastinal, or hilar lymphadenopathy. Thyroid gland and trachea within normal limits. Esophagus is filled with fluid and debris. Moderate-sized hiatal hernia.  Lungs/Pleura: Small bilateral pleural effusions, slightly increased in size. Associated dependent bibasilar atelectasis or consolidation. Mild mosaic attenuation of the lung fields with areas of smooth interlobular septal thickening. No pneumothorax.  Upper Abdomen: No new or acute abnormality within the included upper abdomen.  Musculoskeletal: No new or acute abnormality of the bony structures or chest wall.  Review of the MIP images  confirms the above findings.  IMPRESSION: 1. Negative for acute pulmonary embolus. 2. Small bilateral pleural effusions, slightly increased in size. Associated dependent bibasilar atelectasis or consolidation. 3. Mild mosaic attenuation of the lung fields with areas of smooth interlobular septal thickening, suggestive of mild pulmonary edema. 4. Esophagus is filled with fluid and debris. 5. Moderate-sized hiatal hernia. 6. Aortic atherosclerosis (ICD10-I70.0).  Electronically Signed   By: Duanne Guess D.O.   On: 10/03/2023 12:19 DG Chest Port 1 View CLINICAL DATA:  Shortness of breath.  EXAM: PORTABLE CHEST 1 VIEW  COMPARISON:  09/28/2023.  FINDINGS: Essentially stable exam since the prior study.  Redemonstration of bilateral pleural effusions with probable associated compressive atelectatic changes in the bilateral lower lobes. No acute consolidation. No pneumothorax.  Stable cardio-mediastinal silhouette.  No acute osseous abnormalities.  Redemonstration of 2 adjacent lower thoracic spinal vertebroplasty.  Retrocardiac opacity corresponds to patient's known moderate sized hiatal hernia.  The soft tissues are within normal limits.  IMPRESSION: 1. Stable bilateral pleural effusions. 2. Moderate sized hiatal hernia.  Electronically Signed   By: Jules Schick M.D.   On: 10/03/2023 10:18  Lab Results  Component Value Date   WBC 9.2 10/03/2023   HGB 12.1 10/03/2023   HCT 39.5 10/03/2023   MCV 91.2 10/03/2023   PLT 216 10/03/2023   Last metabolic panel Lab Results  Component Value Date   GLUCOSE 112 (H) 10/03/2023   NA 141 10/03/2023   K 3.9 10/03/2023   CL 100 10/03/2023   CO2 30 10/03/2023   BUN 15 10/03/2023   CREATININE 0.77 10/03/2023   GFRNONAA >60 10/03/2023   CALCIUM 8.8 (L) 10/03/2023   PROT 6.5 10/03/2023   ALBUMIN 3.3 (L) 10/03/2023   LABGLOB 2.9 08/02/2020   AGRATIO 1.9 05/13/2016   BILITOT 1.0 10/03/2023   ALKPHOS 85 10/03/2023    AST 15 10/03/2023   ALT 14 10/03/2023   ANIONGAP 11 10/03/2023    Assessment and Plan: * Acute respiratory failure with hypoxia (HCC) Decompensated respiratory failure now requiring 2 L with concern for volume overload BNP 300 CTA negative PE however showing pulmonary edema as well as?  Infiltrate Noted admission September 22 through September 30 for hypoxia with associated?  Aspiration pneumonia and trace volume overload Will place on IV diuresis IV Rocephin and azithromycin for potential pneumonia coverage Continue supplemental oxygen Blood and respiratory cultures Monitor  Acute on chronic heart failure with preserved ejection fraction (HFpEF) (HCC) 2D echo September 2024 with EF of 50 to 55% and grade 1 diastolic dysfunction Appears clinically volume overloaded BNP of 300 CT imaging with pleural effusions and pulmonary edema IV diuresis Strict ins and outs and daily weights  Monitor volume status Cardiology consult as clinically indicated    Pneumonia Noted decompensated respiratory failure requiring 2 L nasal cannula with concern for pulmonary edema versus infiltrate on CT of the chest Admission around the end of September for similar issues with concern for?  Aspiration pneumonia Afebrile with no white count at present Will give coverage with IV Rocephin azithromycin given hypoxia on presentation Noted food in esophagus-may be an element of recurrent aspiration  Narrow/deescalate  abx as appropriate  Blood and resp culture  Monitor   Dementia (HCC) Stable Continue home Seroquel  Benign essential HTN BP stable Titrate home regimen  CAD in native artery No active chest pain Continue home regimen   Greater than 50% was spent in counseling and coordination of care with patient Total encounter time 80 minutes or more    Advance Care Planning:   Code Status: Limited: Do not attempt resuscitation (DNR) -DNR-LIMITED -Do Not Intubate/DNI    Consults: None    Family Communication: Family at the bedside   Severity of Illness: The appropriate patient status for this patient is INPATIENT. Inpatient status is judged to be reasonable and necessary in order to provide the required intensity of service to ensure the patient's safety. The patient's presenting symptoms, physical exam findings, and initial radiographic and laboratory data in the context of their chronic comorbidities is felt to place them at  high risk for further clinical deterioration. Furthermore, it is not anticipated that the patient will be medically stable for discharge from the hospital within 2 midnights of admission.   * I certify that at the point of admission it is my clinical judgment that the patient will require inpatient hospital care spanning beyond 2 midnights from the point of admission due to high intensity of service, high risk for further deterioration and high frequency of surveillance required.*  Author: Floydene Flock, MD 10/03/2023 12:56 PM  For on call review [email or web address].

## 2023-10-03 NOTE — Assessment & Plan Note (Signed)
No active chest pain Continue home regimen  

## 2023-10-04 DIAGNOSIS — I509 Heart failure, unspecified: Secondary | ICD-10-CM

## 2023-10-04 DIAGNOSIS — J9601 Acute respiratory failure with hypoxia: Secondary | ICD-10-CM | POA: Diagnosis not present

## 2023-10-04 LAB — CBC
HCT: 34 % — ABNORMAL LOW (ref 36.0–46.0)
Hemoglobin: 10.8 g/dL — ABNORMAL LOW (ref 12.0–15.0)
MCH: 28.1 pg (ref 26.0–34.0)
MCHC: 31.8 g/dL (ref 30.0–36.0)
MCV: 88.3 fL (ref 80.0–100.0)
Platelets: 193 10*3/uL (ref 150–400)
RBC: 3.85 MIL/uL — ABNORMAL LOW (ref 3.87–5.11)
RDW: 15.9 % — ABNORMAL HIGH (ref 11.5–15.5)
WBC: 5.9 10*3/uL (ref 4.0–10.5)
nRBC: 0 % (ref 0.0–0.2)

## 2023-10-04 LAB — COMPREHENSIVE METABOLIC PANEL
ALT: 10 U/L (ref 0–44)
AST: 15 U/L (ref 15–41)
Albumin: 3 g/dL — ABNORMAL LOW (ref 3.5–5.0)
Alkaline Phosphatase: 68 U/L (ref 38–126)
Anion gap: 10 (ref 5–15)
BUN: 13 mg/dL (ref 8–23)
CO2: 33 mmol/L — ABNORMAL HIGH (ref 22–32)
Calcium: 8.5 mg/dL — ABNORMAL LOW (ref 8.9–10.3)
Chloride: 100 mmol/L (ref 98–111)
Creatinine, Ser: 0.74 mg/dL (ref 0.44–1.00)
GFR, Estimated: >60 mL/min (ref >60)
Glucose, Bld: 103 mg/dL — ABNORMAL HIGH (ref 70–99)
Potassium: 3.2 mmol/L — ABNORMAL LOW (ref 3.5–5.1)
Sodium: 143 mmol/L (ref 135–145)
Total Bilirubin: 0.7 mg/dL (ref 0.3–1.2)
Total Protein: 5.8 g/dL — ABNORMAL LOW (ref 6.5–8.1)

## 2023-10-04 LAB — HIV ANTIBODY (ROUTINE TESTING W REFLEX): HIV Screen 4th Generation wRfx: NONREACTIVE

## 2023-10-04 MED ORDER — POTASSIUM CHLORIDE CRYS ER 20 MEQ PO TBCR
40.0000 meq | EXTENDED_RELEASE_TABLET | Freq: Two times a day (BID) | ORAL | Status: AC
  Administered 2023-10-04 (×2): 40 meq via ORAL
  Filled 2023-10-04 (×2): qty 2

## 2023-10-04 NOTE — Progress Notes (Signed)
PROGRESS NOTE  Amy [name]    DOB: 02/15/1929, 89 y.o.  MWN:[id number]    Code Status: Limited:(DNR) -DNR-LIMITED -Do Not Intubate/DNI    DOA: 10/03/2023   LOS: 1   Brief hospital course  Amy [name] is a 89 y.o. female with medical history significant of GERD, hypertension, depression, anxiety, CAD. They presented with acute respiratory failure with hypoxia for 1 to 2 days.    Presented to the ER afebrile, hemodynamically stable.  O2 sats in the low 90s to mid 80s and placed on 2Lnc.  Labs: WBC 9.2, hemoglobin 12, platelets 216, creatinine 0.77, BNP 308.   CTA negative for PE, small bilateral pleural effusions but concern for possible bibasilar atelectasis versus consolidation.  Positive pulmonary edema.  Esophagus is filled with fluid and debris.  They were initially treated with rocephin and azithromycin.   Patient was admitted to medicine service for further workup and management of CAP as outlined in detail below.  10/04/23 -stable on 2L  Assessment & Plan  Principal Problem:   Acute respiratory failure with hypoxia (HCC) Active Problems:   Pneumonia   Acute on chronic heart failure with preserved ejection fraction (HFpEF) (HCC)   CAD in native artery   Benign essential HTN   Dementia (HCC)  Acute respiratory failure with hypoxia (HCC)- BNP 300 CTA negative PE however showing pulmonary edema as well as Infiltrate CAP - wean O2 as tolerated to room air - continue CTX, azithromycin - euvolemic on exam. Gentle diuresis - follow cultures.  - PT/OT   Acute on chronic HFpEF (HCC)- echo September 2024 with EF of 50 to 55% and grade 1 diastolic dysfunction. BNP of 300 - gentle PO diuresis   Dementia (HCC)- Stable Continue home Seroquel   Benign essential HTN- BP stable   CAD in native artery- No active chest pain  Body mass index is 23.81 kg/m.  VTE ppx: enoxaparin (LOVENOX) injection 40 mg Start: 10/03/23 1245  Diet:     Diet   Diet Heart Room  service appropriate? Yes; Fluid consistency: Thin   Consultants: None   Subjective 10/04/23    Pt reports no complaints. Denies respiratory distress or increased WOB.    Objective   Vitals:   10/03/23 1223 10/03/23 2341 10/04/23 0438 10/04/23 0500  BP: (!) 155/103  (!) 140/59   Pulse: 65  65   Resp: 19  16   Temp: 98.4 F (36.9 C)  (!) 97.5 F (36.4 C)   TempSrc: Oral  Oral   SpO2: 100%  96%   Weight:  83.3 kg  64.9 kg    Intake/Output Summary (Last 24 hours) at 10/04/2023 0711 Last data filed at 10/03/2023 2145 Gross per 24 hour  Intake 178.21 ml  Output 1250 ml  Net -1071.79 ml   Filed Weights   10/03/23 0846 10/03/23 2341 10/04/23 0500  Weight: 83.3 kg 83.3 kg 64.9 kg     Physical Exam:  General: awake, alert, NAD. Easily falls asleep when not actively talking HEENT: atraumatic, clear conjunctiva, anicteric sclera, MMM, very hard of hearing Respiratory: normal respiratory effort. CTAB Cardiovascular: quick capillary refill, normal S1/S2, RRR, no JVD, murmurs Gastrointestinal: soft, NT, ND Nervous: A&O x3. no gross focal neurologic deficits, normal speech Extremities: moves all equally,trace LE edema Skin: dry, intact, normal temperature, normal color. No rashes, lesions or ulcers on exposed skin Psychiatry: normal mood, congruent affect  Labs   I have personally reviewed the following labs and imaging studies CBC  Component Value Date/Time   WBC 5.9 10/04/2023 0433   RBC 3.85 (L) 10/04/2023 0433   HGB 10.8 (L) 10/04/2023 0433   HGB 13.8 05/13/2016 1054   HCT 34.0 (L) 10/04/2023 0433   HCT 43.3 05/13/2016 1054   PLT 193 10/04/2023 0433   PLT 213 05/13/2016 1054   MCV 88.3 10/04/2023 0433   MCV 86 05/13/2016 1054   MCH 28.1 10/04/2023 0433   MCHC 31.8 10/04/2023 0433   RDW 15.9 (H) 10/04/2023 0433   RDW 14.6 05/13/2016 1054   LYMPHSABS 2.0 10/03/2023 0855   LYMPHSABS 1.9 05/13/2016 1054   MONOABS 0.5 10/03/2023 0855   EOSABS 0.6 (H) 10/03/2023  0855   EOSABS 0.2 05/13/2016 1054   BASOSABS 0.1 10/03/2023 0855   BASOSABS 0.0 05/13/2016 1054      Latest Ref Rng & Units 10/04/2023    4:33 AM 10/03/2023    8:55 AM 09/28/2023    8:02 PM  BMP  Glucose 70 - 99 mg/dL 130  865  784   BUN 8 - 23 mg/dL 13  15  18    Creatinine 0.44 - 1.00 mg/dL 6.96  2.95  2.84   Sodium 135 - 145 mmol/L 143  141  141   Potassium 3.5 - 5.1 mmol/L 3.2  3.9  3.7   Chloride 98 - 111 mmol/L 100  100  104   CO2 22 - 32 mmol/L 33  30  25   Calcium 8.9 - 10.3 mg/dL 8.5  8.8  8.9     CT HEAD WO CONTRAST ( )  Result Date: 10/03/2023 CLINICAL DATA:  Memory loss EXAM: CT HEAD WITHOUT CONTRAST TECHNIQUE: Contiguous axial images were obtained from the base of the skull through the vertex without intravenous contrast. RADIATION DOSE REDUCTION: This exam was performed according to the departmental dose-optimization program which includes automated exposure control, adjustment of the mA and/or kV according to patient size and/or use of iterative reconstruction technique. COMPARISON:  09/08/2023 FINDINGS: Brain: No evidence of acute infarction, hemorrhage, hydrocephalus, extra-axial collection or mass lesion/mass effect. Scattered low-density changes within the periventricular and subcortical white matter most compatible with chronic microvascular ischemic change. Moderate diffuse cerebral volume loss. Vascular: Atherosclerotic calcifications involving the large vessels of the skull base. No unexpected hyperdense vessel. Skull: Normal. Negative for fracture or focal lesion. Sinuses/Orbits: Ongoing left mastoid effusion. Other: None. IMPRESSION: 1. No acute intracranial findings. 2. Chronic microvascular ischemic change and cerebral volume loss. 3. Ongoing left mastoid effusion. Electronically Signed   By: Duanne Guess D.O.   On: 10/03/2023 12:30   CT Angio Chest PE W and/or Wo Contrast  Result Date: 10/03/2023 CLINICAL DATA:  Hypoxia. Pulmonary embolism (PE) suspected,  high prob EXAM: CT ANGIOGRAPHY CHEST WITH CONTRAST TECHNIQUE: Multidetector CT imaging of the chest was performed using the standard protocol during bolus administration of intravenous contrast. Multiplanar CT image reconstructions and MIPs were obtained to evaluate the vascular anatomy. RADIATION DOSE REDUCTION: This exam was performed according to the departmental dose-optimization program which includes automated exposure control, adjustment of the mA and/or kV according to patient size and/or use of iterative reconstruction technique. CONTRAST:  75mL OMNIPAQUE IOHEXOL 350 MG/ML SOLN COMPARISON:  09/14/2023 FINDINGS: Cardiovascular: Satisfactory opacification of the pulmonary arteries to the segmental level. No evidence of pulmonary embolism. Thoracic aorta is nonaneurysmal. Atherosclerotic calcifications of the aorta and coronary arteries. Mild cardiomegaly. No pericardial effusion. Mediastinum/Nodes: Previously seen enlarged subcarinal lymph node is decreased in size now measuring 9 mm, previously 14 mm. No  axillary, mediastinal, or hilar lymphadenopathy. Thyroid gland and trachea within normal limits. Esophagus is filled with fluid and debris. Moderate-sized hiatal hernia. Lungs/Pleura: Small bilateral pleural effusions, slightly increased in size. Associated dependent bibasilar atelectasis or consolidation. Mild mosaic attenuation of the lung fields with areas of smooth interlobular septal thickening. No pneumothorax. Upper Abdomen: No new or acute abnormality within the included upper abdomen. Musculoskeletal: No new or acute abnormality of the bony structures or chest wall. Review of the MIP images confirms the above findings. IMPRESSION: 1. Negative for acute pulmonary embolus. 2. Small bilateral pleural effusions, slightly increased in size. Associated dependent bibasilar atelectasis or consolidation. 3. Mild mosaic attenuation of the lung fields with areas of smooth interlobular septal thickening,  suggestive of mild pulmonary edema. 4. Esophagus is filled with fluid and debris. 5. Moderate-sized hiatal hernia. 6. Aortic atherosclerosis (ICD10-I70.0). Electronically Signed   By: Duanne Guess D.O.   On: 10/03/2023 12:19   DG Chest Port 1 View  Result Date: 10/03/2023 CLINICAL DATA:  Shortness of breath. EXAM: PORTABLE CHEST 1 VIEW COMPARISON:  09/28/2023. FINDINGS: Essentially stable exam since the prior study. Redemonstration of bilateral pleural effusions with probable associated compressive atelectatic changes in the bilateral lower lobes. No acute consolidation. No pneumothorax. Stable cardio-mediastinal silhouette. No acute osseous abnormalities. Redemonstration of 2 adjacent lower thoracic spinal vertebroplasty. Retrocardiac opacity corresponds to patient's known moderate sized hiatal hernia. The soft tissues are within normal limits. IMPRESSION: 1. Stable bilateral pleural effusions. 2. Moderate sized hiatal hernia. Electronically Signed   By: Jules Schick M.D.   On: 10/03/2023 10:18    Disposition Plan & Communication  Patient status: Inpatient  Admitted From: SNF Planned disposition location: Skilled nursing facility Anticipated discharge date: 10/14 pending respiratory improvement  Family Communication: caregiver at bedside    Author: Leeroy Bock, DO [facility] 10/04/2023, 7:11 AM   Available by Epic secure chat 7AM-7PM. If 7PM-7AM, please contact night-coverage.  [facility] contact information found on [email or web address].

## 2023-10-04 NOTE — Evaluation (Signed)
Physical Therapy Evaluation Patient Details Name: Amy [name] MRN: [id number] DOB: 09-Dec-1929 Today's Date: 10/04/2023  History of Present Illness  Amy [name] is a 89 y.o. female with medical history significant of GERD, hypertension, depression, anxiety, CAD presenting with acute respiratory failure with hypoxia.  History primarily from family as well as patient's primary caregiver.  Per report patient with increased work of breathing and shortness of breath over the past 1 to 2 days.  Noted to have been admitted in the hospital at the end of September for similar symptoms.  Had formal diagnosis of aspiration pneumonia with?  Volume overload.  Was discharged on Lasix.  However, family unsure if patient was getting on a regular basis.Upon presentation to ED, CTA negative for PE, small bilateral pleural effusions but concern for possible bibasilar atelectasis versus consolidation.  Positive pulmonary edema. Patient admitted and given IV antibiotics and followed for continued management of acute respiratory failure.  Clinical Impression  89 yo Female admitted with acute respiratory failure due to recent pneumonia. Patient was living at [facility] assisted living. However she was recently admitted with Pneumonia in September and had been discharged to [facility] for short term rehab. Her daughter assists with her care and she has a personal care aide who looks out for her approximately 26 hours a week. Patient was using a wheelchair for stand pivot transfers, requiring +2 assist prior to admittance. She requires assistance for most ADLs. She is very hard of hearing and did not have hearing aides during PT evaluation.PT had to repeat commands and utilize visual/tactile cues for patient to follow instruction. Patient required min A for supine to sit transition. She required mod A with RW for sit to stand transfer and then was able to pivot to bedside commode with mod-min A. Patient does require  instruction for hand placement and walker position as well as cues for orientation to position prior to sitting down. She required therapist assistance to clean herself prior to transferring to bedside chair. Patient has had multiple falls and demonstrates a high fear of falling resulting in decreased forward weight shift with increased difficulty during weight bearing activities. She was not observed to ambulate this session but rather limited to stand pivot transfers. She is on 2L O2, SPO2 level was 97% after stand pivot transfer. She would benefit from skilled PT intervention to improve strength, balance and overall functional mobility.         If plan is discharge home, recommend the following: A lot of help with walking and/or transfers;A lot of help with bathing/dressing/bathroom;Assistance with cooking/housework;Assistance with feeding;Direct supervision/assist for medications management;Direct supervision/assist for financial management;Assist for transportation;Help with stairs or ramp for entrance;Supervision due to cognitive status   Can travel by private vehicle   No    Equipment Recommendations None recommended by PT  Recommendations for Other Services       Functional Status Assessment Patient has had a recent decline in their functional status and demonstrates the ability to make significant improvements in function in a reasonable and predictable amount of time.     Precautions / Restrictions Precautions Precautions: Fall Precaution Comments: right foot drop; has had multiple recent falls Required Braces or Orthoses: Other Brace Other Brace: soft AFO (brace) for R LE per chart, not in room with pt Restrictions Weight Bearing Restrictions: No      Mobility  Bed Mobility Overal bed mobility: Needs Assistance Bed Mobility: Supine to Sit     Supine to sit: Min  assist, HOB elevated, Used rails     General bed mobility comments: requires multiple verbal/tactile cues and  extra time    Transfers Overall transfer level: Needs assistance Equipment used: Rolling walker (2 wheels), 1 person hand held assist Transfers: Sit to/from Stand Sit to Stand: Mod assist Stand pivot transfers: Mod assist, Min assist         General transfer comment: Pt transferred bed to bedside commode and then from [facility] to recliner; Requires moderate assistance for initial stand and then mod to min A for stand pivot requiring assistance to steady and for RW placement;    Ambulation/Gait               General Gait Details: not observed this session;  Stairs            Wheelchair Mobility     Tilt Bed    Modified Rankin (Stroke Patients Only)       Balance Overall balance assessment: Needs assistance, History of Falls Sitting-balance support: Feet supported, Bilateral upper extremity supported Sitting balance-Leahy Scale: Fair Sitting balance - Comments: able to sit edge of bed without loss of balance;   Standing balance support: Bilateral upper extremity supported, During functional activity, Reliant on assistive device for balance Standing balance-Leahy Scale: Poor Standing balance comment: Requires min-mod A to steady self when standing with RW                             Pertinent Vitals/Pain Pain Assessment Pain Assessment: No/denies pain    Home Living Family/patient expects to be discharged to:: Assisted living                 Home Equipment: Agricultural consultant (2 wheels);Lift chair;Wheelchair Financial trader (4 wheels) Additional Comments: multiple falls in the last 6 months    Prior Function Prior Level of Function : Needs assist;History of Falls (last six months)       Physical Assist : Mobility (physical);ADLs (physical) Mobility (physical): Transfers;Gait ADLs (physical): Feeding;Bathing;Dressing;Toileting;IADLs Mobility Comments: Pt reported she has been using a WC for stand pivot transfers since attending ALF in  begining of September. Family states that prior to September pt was amb with rollator. ADLs Comments: Per family/patient report staff at her ALF assist her in all ADLs.     Extremity/Trunk Assessment   Upper Extremity Assessment Upper Extremity Assessment: Overall WFL for tasks assessed    Lower Extremity Assessment Lower Extremity Assessment: Overall WFL for tasks assessed    Cervical / Trunk Assessment Cervical / Trunk Assessment: Kyphotic  Communication   Communication Communication: Hearing impairment Cueing Techniques: Verbal cues;Tactile cues;Visual cues  Cognition Arousal: Lethargic Behavior During Therapy: WFL for tasks assessed/performed Overall Cognitive Status: Within Functional Limits for tasks assessed                         Following Commands: Follows one step commands with increased time       General Comments: pt hard of hearing, requiring repeated instruction; oriented to person, place, situation and date        General Comments General comments (skin integrity, edema, etc.): has high fear of falling resulting in poor weight shift during functional tasks    Exercises     Assessment/Plan    PT Assessment Patient needs continued PT services  PT Problem List Decreased strength;Decreased range of motion;Decreased activity tolerance;Decreased balance;Decreased mobility;Decreased coordination;Decreased safety awareness;Pain  PT Treatment Interventions DME instruction;Gait training;Stair training;Functional mobility training;Therapeutic activities;Therapeutic exercise;Balance training;Neuromuscular re-education;Patient/family education    PT Goals (Current goals can be found in the Care Plan section)  Acute Rehab PT Goals Patient Stated Goal: get stronger PT Goal Formulation: With patient Time For Goal Achievement: 10/18/23 Potential to Achieve Goals: Good    Frequency Min 1X/week     Co-evaluation               AM-PAC PT  "6 Clicks" Mobility  Outcome Measure Help needed turning from your back to your side while in a flat bed without using bedrails?: A Lot Help needed moving from lying on your back to sitting on the side of a flat bed without using bedrails?: A Lot Help needed moving to and from a bed to a chair (including a wheelchair)?: A Lot Help needed standing up from a chair using your arms (e.g., wheelchair or bedside chair)?: A Lot Help needed to walk in hospital room?: A Lot Help needed climbing 3-5 steps with a railing? : Total 6 Click Score: 11    End of Session Equipment Utilized During Treatment: Gait belt Activity Tolerance: Patient tolerated treatment well Patient left: with call bell/phone within reach;with family/visitor present;with chair alarm set;in chair Nurse Communication: Mobility status PT Visit Diagnosis: Unsteadiness on feet (R26.81);Repeated falls (R29.6);Muscle weakness (generalized) (M62.81)    Time: 5366-4403 PT Time Calculation (min) (ACUTE ONLY): 37 min   Charges:   PT Evaluation $PT Eval Low Complexity: 1 Low PT Treatments $Therapeutic Activity: 8-22 mins PT General Charges $$ ACUTE PT VISIT: 1 Visit          Elizzie Westergard PT, DPT 10/04/2023, 4:05 PM

## 2023-10-05 DIAGNOSIS — J189 Pneumonia, unspecified organism: Secondary | ICD-10-CM | POA: Diagnosis not present

## 2023-10-05 DIAGNOSIS — J9601 Acute respiratory failure with hypoxia: Secondary | ICD-10-CM | POA: Diagnosis not present

## 2023-10-05 DIAGNOSIS — I509 Heart failure, unspecified: Secondary | ICD-10-CM | POA: Diagnosis not present

## 2023-10-05 LAB — CBC
HCT: 35.3 % — ABNORMAL LOW (ref 36.0–46.0)
Hemoglobin: 11.4 g/dL — ABNORMAL LOW (ref 12.0–15.0)
MCH: 28.5 pg (ref 26.0–34.0)
MCHC: 32.3 g/dL (ref 30.0–36.0)
MCV: 88.3 fL (ref 80.0–100.0)
Platelets: 186 10*3/uL (ref 150–400)
RBC: 4 MIL/uL (ref 3.87–5.11)
RDW: 16 % — ABNORMAL HIGH (ref 11.5–15.5)
WBC: 6.5 10*3/uL (ref 4.0–10.5)
nRBC: 0 % (ref 0.0–0.2)

## 2023-10-05 LAB — BASIC METABOLIC PANEL
Anion gap: 14 (ref 5–15)
BUN: 12 mg/dL (ref 8–23)
CO2: 31 mmol/L (ref 22–32)
Calcium: 8.8 mg/dL — ABNORMAL LOW (ref 8.9–10.3)
Chloride: 97 mmol/L — ABNORMAL LOW (ref 98–111)
Creatinine, Ser: 0.61 mg/dL (ref 0.44–1.00)
GFR, Estimated: >60 mL/min (ref >60)
Glucose, Bld: 108 mg/dL — ABNORMAL HIGH (ref 70–99)
Potassium: 4.1 mmol/L (ref 3.5–5.1)
Sodium: 142 mmol/L (ref 135–145)

## 2023-10-05 MED ORDER — AZITHROMYCIN 500 MG PO TABS
500.0000 mg | ORAL_TABLET | Freq: Every day | ORAL | Status: DC
  Administered 2023-10-05 – 2023-10-06 (×2): 500 mg via ORAL
  Filled 2023-10-05 (×2): qty 1

## 2023-10-05 MED ORDER — POLYETHYLENE GLYCOL 3350 17 G PO PACK
17.0000 g | PACK | Freq: Two times a day (BID) | ORAL | Status: DC
  Administered 2023-10-05 – 2023-10-06 (×3): 17 g via ORAL
  Filled 2023-10-05 (×3): qty 1

## 2023-10-05 MED ORDER — ONDANSETRON HCL 4 MG/2ML IJ SOLN
4.0000 mg | Freq: Four times a day (QID) | INTRAMUSCULAR | Status: DC | PRN
  Administered 2023-10-05: 4 mg via INTRAVENOUS
  Filled 2023-10-05: qty 2

## 2023-10-05 NOTE — Progress Notes (Signed)
PROGRESS NOTE  Amy [name]    DOB: September 14, 1929, 89 y.o.  WGN:[id number]    Code Status: Limited:(DNR) -DNR-LIMITED -Do Not Intubate/DNI    DOA: 10/03/2023   LOS: 2   Brief hospital course  Amy [name] is a 89 y.o. female with medical history significant of GERD, hypertension, depression, anxiety, CAD. They presented with acute respiratory failure with hypoxia for 1 to 2 days.    Presented to the ER afebrile, hemodynamically stable.  O2 sats in the low 90s to mid 80s and placed on 2Lnc.  Labs: WBC 9.2, hemoglobin 12, platelets 216, creatinine 0.77, BNP 308.   CTA negative for PE, small bilateral pleural effusions but concern for possible bibasilar atelectasis versus consolidation.  Positive pulmonary edema.  Esophagus is filled with fluid and debris.  They were initially treated with rocephin and azithromycin.   Patient was admitted to medicine service for further workup and management of CAP as outlined in detail below.  10/05/23 -stable on 1L. Feels nauseous today  Assessment & Plan  Principal Problem:   Acute respiratory failure with hypoxia (HCC) Active Problems:   Pneumonia   Acute on chronic heart failure with preserved ejection fraction (HFpEF) (HCC)   CAD in native artery   Benign essential HTN   Dementia (HCC)   Acute on chronic congestive heart failure (HCC)  Acute respiratory failure with hypoxia (HCC)- BNP 300 CTA negative PE however showing pulmonary edema as well as Infiltrate CAP  CHF exacerbation - wean O2 as tolerated to room air - continue CTX, azithromycin - euvolemic on exam. Gentle diuresis - follow cultures.  - PT/OT- recommending SNF - flutter valve   Acute on chronic HFpEF (HCC)- echo September 2024 with EF of 50 to 55% and grade 1 diastolic dysfunction. BNP of 300 - gentle PO diuresis. Kidney function tolerating well   Dementia (HCC)- Stable Continue home Seroquel   Benign essential HTN- BP stable   CAD in native artery- No active  chest pain  Body mass index is 24.03 kg/m.  VTE ppx: enoxaparin (LOVENOX) injection 40 mg Start: 10/03/23 1245  Diet:     Diet   Diet Heart Room service appropriate? Yes; Fluid consistency: Thin   Consultants: None   Subjective 10/05/23    Pt reports nausea and poor appetite. Denies respiratory distress or increased WOB.    Objective   Vitals:   10/04/23 1557 10/04/23 2040 10/05/23 0427 10/05/23 0500  BP:  (!) 151/74 (!) 156/68   Pulse:  73 80   Resp:  16 20   Temp:  98 F (36.7 C) 97.7 F (36.5 C)   TempSrc:  Oral Oral   SpO2: 97% 96% 99%   Weight:    65.5 kg    Intake/Output Summary (Last 24 hours) at 10/05/2023 0718 Last data filed at 10/05/2023 7846 Gross per 24 hour  Intake --  Output 1000 ml  Net -1000 ml   Filed Weights   10/03/23 2341 10/04/23 0500 10/05/23 0500  Weight: 83.3 kg 64.9 kg 65.5 kg    Physical Exam:  General: awake, alert, NAD.  HEENT: atraumatic, clear conjunctiva, anicteric sclera, MMM, very hard of hearing Respiratory: normal respiratory effort. CTAB Cardiovascular: quick capillary refill, normal S1/S2, RRR, no JVD, murmurs Nervous: A&O x3. no gross focal neurologic deficits, normal speech Extremities: moves all equally,trace LE edema Skin: dry, intact, normal temperature, normal color. No rashes, lesions or ulcers on exposed skin Psychiatry: normal mood, congruent affect  Labs   I have  personally reviewed the following labs and imaging studies CBC    Component Value Date/Time   WBC 6.5 10/05/2023 0457   RBC 4.00 10/05/2023 0457   HGB 11.4 (L) 10/05/2023 0457   HGB 13.8 05/13/2016 1054   HCT 35.3 (L) 10/05/2023 0457   HCT 43.3 05/13/2016 1054   PLT 186 10/05/2023 0457   PLT 213 05/13/2016 1054   MCV 88.3 10/05/2023 0457   MCV 86 05/13/2016 1054   MCH 28.5 10/05/2023 0457   MCHC 32.3 10/05/2023 0457   RDW 16.0 (H) 10/05/2023 0457   RDW 14.6 05/13/2016 1054   LYMPHSABS 2.0 10/03/2023 0855   LYMPHSABS 1.9 05/13/2016 1054    MONOABS 0.5 10/03/2023 0855   EOSABS 0.6 (H) 10/03/2023 0855   EOSABS 0.2 05/13/2016 1054   BASOSABS 0.1 10/03/2023 0855   BASOSABS 0.0 05/13/2016 1054      Latest Ref Rng & Units 10/05/2023    4:57 AM 10/04/2023    4:33 AM 10/03/2023    8:55 AM  BMP  Glucose 70 - 99 mg/dL 062  694  854   BUN 8 - 23 mg/dL 12  13  15    Creatinine 0.44 - 1.00 mg/dL 6.27  0.35  0.09   Sodium 135 - 145 mmol/L 142  143  141   Potassium 3.5 - 5.1 mmol/L 4.1  3.2  3.9   Chloride 98 - 111 mmol/L 97  100  100   CO2 22 - 32 mmol/L 31  33  30   Calcium 8.9 - 10.3 mg/dL 8.8  8.5  8.8     CT HEAD WO CONTRAST ( )  Result Date: 10/03/2023 CLINICAL DATA:  Memory loss EXAM: CT HEAD WITHOUT CONTRAST TECHNIQUE: Contiguous axial images were obtained from the base of the skull through the vertex without intravenous contrast. RADIATION DOSE REDUCTION: This exam was performed according to the departmental dose-optimization program which includes automated exposure control, adjustment of the mA and/or kV according to patient size and/or use of iterative reconstruction technique. COMPARISON:  09/08/2023 FINDINGS: Brain: No evidence of acute infarction, hemorrhage, hydrocephalus, extra-axial collection or mass lesion/mass effect. Scattered low-density changes within the periventricular and subcortical white matter most compatible with chronic microvascular ischemic change. Moderate diffuse cerebral volume loss. Vascular: Atherosclerotic calcifications involving the large vessels of the skull base. No unexpected hyperdense vessel. Skull: Normal. Negative for fracture or focal lesion. Sinuses/Orbits: Ongoing left mastoid effusion. Other: None. IMPRESSION: 1. No acute intracranial findings. 2. Chronic microvascular ischemic change and cerebral volume loss. 3. Ongoing left mastoid effusion. Electronically Signed   By: Duanne Guess D.O.   On: 10/03/2023 12:30   CT Angio Chest PE W and/or Wo Contrast  Result Date:  10/03/2023 CLINICAL DATA:  Hypoxia. Pulmonary embolism (PE) suspected, high prob EXAM: CT ANGIOGRAPHY CHEST WITH CONTRAST TECHNIQUE: Multidetector CT imaging of the chest was performed using the standard protocol during bolus administration of intravenous contrast. Multiplanar CT image reconstructions and MIPs were obtained to evaluate the vascular anatomy. RADIATION DOSE REDUCTION: This exam was performed according to the departmental dose-optimization program which includes automated exposure control, adjustment of the mA and/or kV according to patient size and/or use of iterative reconstruction technique. CONTRAST:  75mL OMNIPAQUE IOHEXOL 350 MG/ML SOLN COMPARISON:  09/14/2023 FINDINGS: Cardiovascular: Satisfactory opacification of the pulmonary arteries to the segmental level. No evidence of pulmonary embolism. Thoracic aorta is nonaneurysmal. Atherosclerotic calcifications of the aorta and coronary arteries. Mild cardiomegaly. No pericardial effusion. Mediastinum/Nodes: Previously seen enlarged subcarinal lymph node is  decreased in size now measuring 9 mm, previously 14 mm. No axillary, mediastinal, or hilar lymphadenopathy. Thyroid gland and trachea within normal limits. Esophagus is filled with fluid and debris. Moderate-sized hiatal hernia. Lungs/Pleura: Small bilateral pleural effusions, slightly increased in size. Associated dependent bibasilar atelectasis or consolidation. Mild mosaic attenuation of the lung fields with areas of smooth interlobular septal thickening. No pneumothorax. Upper Abdomen: No new or acute abnormality within the included upper abdomen. Musculoskeletal: No new or acute abnormality of the bony structures or chest wall. Review of the MIP images confirms the above findings. IMPRESSION: 1. Negative for acute pulmonary embolus. 2. Small bilateral pleural effusions, slightly increased in size. Associated dependent bibasilar atelectasis or consolidation. 3. Mild mosaic attenuation of the  lung fields with areas of smooth interlobular septal thickening, suggestive of mild pulmonary edema. 4. Esophagus is filled with fluid and debris. 5. Moderate-sized hiatal hernia. 6. Aortic atherosclerosis (ICD10-I70.0). Electronically Signed   By: Duanne Guess D.O.   On: 10/03/2023 12:19   DG Chest Port 1 View  Result Date: 10/03/2023 CLINICAL DATA:  Shortness of breath. EXAM: PORTABLE CHEST 1 VIEW COMPARISON:  09/28/2023. FINDINGS: Essentially stable exam since the prior study. Redemonstration of bilateral pleural effusions with probable associated compressive atelectatic changes in the bilateral lower lobes. No acute consolidation. No pneumothorax. Stable cardio-mediastinal silhouette. No acute osseous abnormalities. Redemonstration of 2 adjacent lower thoracic spinal vertebroplasty. Retrocardiac opacity corresponds to patient's known moderate sized hiatal hernia. The soft tissues are within normal limits. IMPRESSION: 1. Stable bilateral pleural effusions. 2. Moderate sized hiatal hernia. Electronically Signed   By: Jules Schick M.D.   On: 10/03/2023 10:18    Disposition Plan & Communication  Patient status: Inpatient  Admitted From: SNF Planned disposition location: Skilled nursing facility Anticipated discharge date: 10/14 pending respiratory improvement  Family Communication: caregiver at bedside and daughter on phone    Author: Leeroy Bock, DO [facility] 10/05/2023, 7:18 AM   Available by Epic secure chat 7AM-7PM. If 7PM-7AM, please contact night-coverage.  [facility] contact information found on [email or web address].

## 2023-10-05 NOTE — TOC CM/SW Note (Signed)
Attempted call to daughter [name] to discuss SNF rec. No answer and VM is full, will reattempt later.  Per chart review, patient was at [facility] for STR prior to Admission. CSW reached out to [place] at [facility] on 10/11 to confirm - but still awaiting response. Per chart review, patient resides at [facility] at baseline.   Alfonso Ramus, LCSW Transitions of Care Department [phone]

## 2023-10-05 NOTE — Progress Notes (Signed)
PHARMACIST - PHYSICIAN COMMUNICATION  CONCERNING: Antibiotic IV to Oral Route Change Policy  RECOMMENDATION: This patient is receiving Azithromycin by the intravenous route.  Based on criteria approved by the Pharmacy and Therapeutics Committee, the antibiotic(s) is/are being converted to the equivalent oral dose form(s).   DESCRIPTION: These criteria include: Patient being treated for a respiratory tract infection, urinary tract infection, cellulitis or clostridium difficile associated diarrhea if on metronidazole The patient is not neutropenic and does not exhibit a GI malabsorption state The patient is eating (either orally or via tube) and/or has been taking other orally administered medications for a least 24 hours The patient is improving clinically and has a Tmax < 100.5  If you have questions about this conversion, please contact the Pharmacy Department   Gardner Candle, PharmD, BCPS Clinical Pharmacist 10/05/2023 7:18 AM

## 2023-10-05 NOTE — Plan of Care (Signed)

## 2023-10-06 DIAGNOSIS — J9601 Acute respiratory failure with hypoxia: Secondary | ICD-10-CM | POA: Diagnosis not present

## 2023-10-06 DIAGNOSIS — I509 Heart failure, unspecified: Secondary | ICD-10-CM | POA: Diagnosis not present

## 2023-10-06 LAB — BASIC METABOLIC PANEL
Anion gap: 14 (ref 5–15)
BUN: 11 mg/dL (ref 8–23)
CO2: 32 mmol/L (ref 22–32)
Calcium: 8.8 mg/dL — ABNORMAL LOW (ref 8.9–10.3)
Chloride: 95 mmol/L — ABNORMAL LOW (ref 98–111)
Creatinine, Ser: 0.69 mg/dL (ref 0.44–1.00)
GFR, Estimated: >60 mL/min (ref >60)
Glucose, Bld: 112 mg/dL — ABNORMAL HIGH (ref 70–99)
Potassium: 3.7 mmol/L (ref 3.5–5.1)
Sodium: 141 mmol/L (ref 135–145)

## 2023-10-06 MED ORDER — TORSEMIDE 10 MG PO TABS: 10.0000 mg | ORAL_TABLET | Freq: Every day | ORAL | Status: AC

## 2023-10-06 MED ORDER — AZITHROMYCIN 500 MG PO TABS: 500.0000 mg | ORAL_TABLET | Freq: Every day | ORAL | Status: AC

## 2023-10-06 NOTE — TOC Transition Note (Addendum)
Transition of Care [facility]) - CM/SW Discharge Note   Patient Details  Name: Amy [name] MRN: [id number] Date of Birth: August 07, 1929  Transition of Care [facility]) CM/SW Contact:  Allena Katz, LCSW Phone Number: 10/06/2023, 1:11 PM   Clinical Narrative:   Pt discharging back to [facility] via ems. RN given number for report. DC summary sent. Daughter notified. Ricky with [facility] notified. CSW signing off. DNR on chart.     Final next level of care: Skilled Nursing Facility Barriers to Discharge: Barriers Resolved   Patient Goals and CMS Choice      Discharge Placement                  Patient to be transferred to facility by: [facility] Name of family member notified: [name] Patient and family notified of of transfer: 10/06/23  Discharge Plan and Services Additional resources added to the After Visit Summary for                                       Social Determinants of Health (SDOH) Interventions SDOH Screenings   Food Insecurity: No Food Insecurity (10/03/2023)  Housing: Low Risk  (10/03/2023)  Transportation Needs: No Transportation Needs (10/03/2023)  Utilities: Not At Risk (10/03/2023)  Tobacco Use: Low Risk  (10/03/2023)     Readmission Risk Interventions     No data to display

## 2023-10-06 NOTE — TOC Progression Note (Signed)
Transition of Care [facility]) - Progression Note    Patient Details  Name: Amy [name] MRN: [id number] Date of Birth: [birth year]  [facility] CM/SW Contact  Allena Katz, LCSW Phone Number: 10/06/2023, 11:11 AM  Clinical Narrative:   CSW spoke with daughter Dois Davenport who reports that she would like her to go back to [facility]. She reports that she believes she may have to go to [facility] on oxygen. CSW to follow up with MD on when patient is able to return.           Expected Discharge Plan and Services                                               Social Determinants of Health (SDOH) Interventions SDOH Screenings   Food Insecurity: No Food Insecurity (10/03/2023)  Housing: Low Risk  (10/03/2023)  Transportation Needs: No Transportation Needs (10/03/2023)  Utilities: Not At Risk (10/03/2023)  Tobacco Use: Low Risk  (10/03/2023)    Readmission Risk Interventions     No data to display

## 2023-10-06 NOTE — Plan of Care (Signed)
  Problem: Pain Managment: Goal: General experience of comfort will improve Outcome: Progressing   Problem: Safety: Goal: Ability to remain free from injury will improve Outcome: Progressing   Problem: Skin Integrity: Goal: Risk for impaired skin integrity will decrease Outcome: Progressing   

## 2023-10-06 NOTE — Progress Notes (Incomplete)
PROGRESS NOTE  Amy [name]    DOB: Aug 19, 1929, 89 y.o.  WUJ:[id number]    Code Status: Limited:(DNR) -DNR-LIMITED -Do Not Intubate/DNI    DOA: 10/03/2023   LOS: 3   Brief hospital course  Amy [name] is a 89 y.o. female with medical history significant of GERD, hypertension, depression, anxiety, CAD. They presented with acute respiratory failure with hypoxia for 1 to 2 days.    Presented to the ER afebrile, hemodynamically stable.  O2 sats in the low 90s to mid 80s and placed on 2Lnc.  Labs: WBC 9.2, hemoglobin 12, platelets 216, creatinine 0.77, BNP 308.   CTA negative for PE, small bilateral pleural effusions but concern for possible bibasilar atelectasis versus consolidation.  Positive pulmonary edema.  Esophagus is filled with fluid and debris.  They were initially treated with rocephin and azithromycin.   Patient was admitted to medicine service for further workup and management of CAP as outlined in detail below.  10/06/23 -stable on 1L. Feels nauseous today  Assessment & Plan  Principal Problem:   Acute respiratory failure with hypoxia (HCC) Active Problems:   Pneumonia   Acute on chronic heart failure with preserved ejection fraction (HFpEF) (HCC)   CAD in native artery   Benign essential HTN   Dementia (HCC)   Acute on chronic congestive heart failure (HCC)   Community acquired pneumonia  Acute respiratory failure with hypoxia (HCC)- BNP 300 CTA negative PE however showing pulmonary edema as well as Infiltrate CAP  CHF exacerbation - wean O2 as tolerated to room air - continue CTX, azithromycin - euvolemic on exam. Gentle diuresis - follow cultures.  - PT/OT- recommending SNF - flutter valve   Acute on chronic HFpEF (HCC)- echo September 2024 with EF of 50 to 55% and grade 1 diastolic dysfunction. BNP of 300 - gentle PO diuresis. Kidney function tolerating well   Dementia (HCC)- Stable Continue home Seroquel   Benign essential HTN- BP stable    CAD in native artery- No active chest pain  Body mass index is 24.03 kg/m.  VTE ppx: enoxaparin (LOVENOX) injection 40 mg Start: 10/03/23 1245  Diet:     Diet   Diet Heart Room service appropriate? Yes; Fluid consistency: Thin   Consultants: None   Subjective 10/06/23    Pt reports nausea and poor appetite. Denies respiratory distress or increased WOB.    Objective   Vitals:   10/05/23 0740 10/05/23 1636 10/05/23 1957 10/06/23 0256  BP: (!) 153/71 (!) 167/68 (!) 140/74   Pulse: 81 98 (!) 101   Resp: 20 16 17    Temp: 98.7 F (37.1 C) 98.3 F (36.8 C) 98.7 F (37.1 C)   TempSrc:  Oral    SpO2: 98% 91% 98%   Weight:    65.5 kg    Intake/Output Summary (Last 24 hours) at 10/06/2023 0715 Last data filed at 10/05/2023 1545 Gross per 24 hour  Intake --  Output 1900 ml  Net -1900 ml   Filed Weights   10/04/23 0500 10/05/23 0500 10/06/23 0256  Weight: 64.9 kg 65.5 kg 65.5 kg    Physical Exam:  General: awake, alert, NAD.  HEENT: atraumatic, clear conjunctiva, anicteric sclera, MMM, very hard of hearing Respiratory: normal respiratory effort. CTAB Cardiovascular: quick capillary refill, normal S1/S2, RRR, no JVD, murmurs Nervous: A&O x3. no gross focal neurologic deficits, normal speech Extremities: moves all equally,trace LE edema Skin: dry, intact, normal temperature, normal color. No rashes, lesions or ulcers on exposed skin Psychiatry:  normal mood, congruent affect  Labs   I have personally reviewed the following labs and imaging studies CBC    Component Value Date/Time   WBC 6.5 10/05/2023 0457   RBC 4.00 10/05/2023 0457   HGB 11.4 (L) 10/05/2023 0457   HGB 13.8 05/13/2016 1054   HCT 35.3 (L) 10/05/2023 0457   HCT 43.3 05/13/2016 1054   PLT 186 10/05/2023 0457   PLT 213 05/13/2016 1054   MCV 88.3 10/05/2023 0457   MCV 86 05/13/2016 1054   MCH 28.5 10/05/2023 0457   MCHC 32.3 10/05/2023 0457   RDW 16.0 (H) 10/05/2023 0457   RDW 14.6 05/13/2016  1054   LYMPHSABS 2.0 10/03/2023 0855   LYMPHSABS 1.9 05/13/2016 1054   MONOABS 0.5 10/03/2023 0855   EOSABS 0.6 (H) 10/03/2023 0855   EOSABS 0.2 05/13/2016 1054   BASOSABS 0.1 10/03/2023 0855   BASOSABS 0.0 05/13/2016 1054      Latest Ref Rng & Units 10/06/2023    4:36 AM 10/05/2023    4:57 AM 10/04/2023    4:33 AM  BMP  Glucose 70 - 99 mg/dL 841  324  401   BUN 8 - 23 mg/dL 11  12  13    Creatinine 0.44 - 1.00 mg/dL 0.27  2.53  6.64   Sodium 135 - 145 mmol/L 141  142  143   Potassium 3.5 - 5.1 mmol/L 3.7  4.1  3.2   Chloride 98 - 111 mmol/L 95  97  100   CO2 22 - 32 mmol/L 32  31  33   Calcium 8.9 - 10.3 mg/dL 8.8  8.8  8.5     No results found.  Disposition Plan & Communication  Patient status: Inpatient  Admitted From: SNF Planned disposition location: Skilled nursing facility Anticipated discharge date: 10/14 pending respiratory improvement  Family Communication: caregiver at bedside and daughter on phone    Author: Leeroy Bock, DO [facility] 10/06/2023, 7:15 AM   Available by Epic secure chat 7AM-7PM. If 7PM-7AM, please contact night-coverage.  [facility] contact information found on [email or web address].

## 2023-10-06 NOTE — Discharge Summary (Addendum)
bilateral pleural effusions. 2. Moderate sized hiatal hernia. Electronically Signed   By: Jules Schick M.D.   On: 10/03/2023 10:18   DG Chest 2 View  Result Date: 09/29/2023 CLINICAL DATA:  Dyspnea EXAM: CHEST - 2 VIEW COMPARISON:  09/14/2023 FINDINGS: Small bilateral pleural effusions are present, stable since prior examination. No superimposed confluent pulmonary infiltrate. No pneumothorax. Cardiac size is within normal limits. Pulmonary vascularity is normal. T11 and T12 vertebroplasty noted. IMPRESSION: 1. Small bilateral pleural  effusions. Electronically Signed   By: Helyn Numbers M.D.   On: 09/29/2023 00:53   DG Shoulder Left  Result Date: 09/28/2023 CLINICAL DATA:  Left shoulder pain following physical therapy today. EXAM: LEFT SHOULDER - 2+ VIEW COMPARISON:  None Available. FINDINGS: There is no evidence of fracture or dislocation. There is no evidence of glenohumeral arthropathy or other focal bone abnormality. Mild osteopenia. Mild spurring AC joint without AC separation. Soft tissues are unremarkable. There is artifact from overlying clothing. Thoracic aorta is tortuous and heavily calcified. Small left pleural effusion. Overlying left basilar atelectasis or consolidation. Most recent chest study is CTA chest 09/14/2023 with similar findings on this study. Kyphoplasty cement is again noted in the T11 and 12 vertebral bodies. IMPRESSION: 1. No evidence of fracture or dislocation. Mild osteopenia. 2. Degenerative spurring AC joint. 3. Small left pleural effusion with overlying atelectasis or consolidation. 4. Aortic atherosclerosis. Electronically Signed   By: Almira Bar M.D.   On: 09/28/2023 21:17   ECHOCARDIOGRAM COMPLETE  Result Date: 09/16/2023    ECHOCARDIOGRAM REPORT   Patient Name:   Amy [name] [facility] Date of Exam: 09/15/2023 Medical Rec #:  [id number]         Height:       65.0 in Accession #:    [id number]        Weight:       144.0 lb Date of Birth:  25-Jun-1929         BSA:          1.720 m Patient Age:    89 years          BP:           169/69 mmHg Patient Gender: F                 HR:           75 bpm. Exam Location:  [facility] Procedure: 2D Echo, Cardiac Doppler and Color Doppler Indications:     R06.00 Dyspnea  History:         Patient has prior history of Echocardiogram examinations, most                  recent 09/12/2016. Signs/Symptoms:Dyspnea; Risk                  Factors:Hypertension and Dyslipidemia.  Sonographer:     Daphine Deutscher RDCS Referring Phys:  [id number] Wilfred Curtis VQQV Diagnosing Phys:  Marcina Millard MD IMPRESSIONS  1. Left ventricular ejection fraction, by estimation, is 60 to 65%. The left ventricle has normal function. The left ventricle has no regional wall motion abnormalities. Left ventricular diastolic parameters are consistent with Grade I diastolic dysfunction (impaired relaxation).  2. Right ventricular systolic function is normal. The right ventricular size is normal.  3. The mitral valve is normal in structure. No evidence of mitral valve regurgitation. No evidence of mitral stenosis.  4. The aortic valve is normal in structure. Aortic valve regurgitation is trivial. No aortic stenosis  bilateral pleural effusions. 2. Moderate sized hiatal hernia. Electronically Signed   By: Jules Schick M.D.   On: 10/03/2023 10:18   DG Chest 2 View  Result Date: 09/29/2023 CLINICAL DATA:  Dyspnea EXAM: CHEST - 2 VIEW COMPARISON:  09/14/2023 FINDINGS: Small bilateral pleural effusions are present, stable since prior examination. No superimposed confluent pulmonary infiltrate. No pneumothorax. Cardiac size is within normal limits. Pulmonary vascularity is normal. T11 and T12 vertebroplasty noted. IMPRESSION: 1. Small bilateral pleural  effusions. Electronically Signed   By: Helyn Numbers M.D.   On: 09/29/2023 00:53   DG Shoulder Left  Result Date: 09/28/2023 CLINICAL DATA:  Left shoulder pain following physical therapy today. EXAM: LEFT SHOULDER - 2+ VIEW COMPARISON:  None Available. FINDINGS: There is no evidence of fracture or dislocation. There is no evidence of glenohumeral arthropathy or other focal bone abnormality. Mild osteopenia. Mild spurring AC joint without AC separation. Soft tissues are unremarkable. There is artifact from overlying clothing. Thoracic aorta is tortuous and heavily calcified. Small left pleural effusion. Overlying left basilar atelectasis or consolidation. Most recent chest study is CTA chest 09/14/2023 with similar findings on this study. Kyphoplasty cement is again noted in the T11 and 12 vertebral bodies. IMPRESSION: 1. No evidence of fracture or dislocation. Mild osteopenia. 2. Degenerative spurring AC joint. 3. Small left pleural effusion with overlying atelectasis or consolidation. 4. Aortic atherosclerosis. Electronically Signed   By: Almira Bar M.D.   On: 09/28/2023 21:17   ECHOCARDIOGRAM COMPLETE  Result Date: 09/16/2023    ECHOCARDIOGRAM REPORT   Patient Name:   Amy [name] [facility] Date of Exam: 09/15/2023 Medical Rec #:  [id number]         Height:       65.0 in Accession #:    [id number]        Weight:       144.0 lb Date of Birth:  25-Jun-1929         BSA:          1.720 m Patient Age:    89 years          BP:           169/69 mmHg Patient Gender: F                 HR:           75 bpm. Exam Location:  [facility] Procedure: 2D Echo, Cardiac Doppler and Color Doppler Indications:     R06.00 Dyspnea  History:         Patient has prior history of Echocardiogram examinations, most                  recent 09/12/2016. Signs/Symptoms:Dyspnea; Risk                  Factors:Hypertension and Dyslipidemia.  Sonographer:     Daphine Deutscher RDCS Referring Phys:  [id number] Wilfred Curtis VQQV Diagnosing Phys:  Marcina Millard MD IMPRESSIONS  1. Left ventricular ejection fraction, by estimation, is 60 to 65%. The left ventricle has normal function. The left ventricle has no regional wall motion abnormalities. Left ventricular diastolic parameters are consistent with Grade I diastolic dysfunction (impaired relaxation).  2. Right ventricular systolic function is normal. The right ventricular size is normal.  3. The mitral valve is normal in structure. No evidence of mitral valve regurgitation. No evidence of mitral stenosis.  4. The aortic valve is normal in structure. Aortic valve regurgitation is trivial. No aortic stenosis  Physician Discharge Summary  Patient: Amy [name] [id number] DOB: [birth year]   Code Status: Limited: Do not attempt resuscitation (DNR) -DNR-LIMITED -Do Not Intubate/DNI  Admit date: 10/03/2023 Discharge date: 10/06/2023 Disposition: Skilled nursing facility, PT, OT, SLP, nurse aid, and RN PCP: Danella Penton, MD  Recommendations for Outpatient Follow-up:  Follow up with PCP within 1-2 weeks Regarding general hospital follow up and preventative care Recommend evaluating oxygen requirement and wean from oxygen as tolerated Assess fluid status and electrolytes. Discharged on chronic torsemide.  Discharge Diagnoses:  Principal Problem:   Acute respiratory failure with hypoxia (HCC) Active Problems:   Pneumonia   Acute on chronic heart failure with preserved ejection fraction (HFpEF) (HCC)   CAD in native artery   Benign essential HTN   Dementia (HCC)   Acute on chronic congestive heart failure [facility])   Community acquired pneumonia  Brief Hospital Course Summary: Amy [name] is a 89 y.o. female with medical history significant of GERD, hypertension, depression, anxiety, CAD. They presented with acute respiratory failure with hypoxia for 1 to 2 days.     Presented to the ER afebrile, hemodynamically stable.  O2 sats in the low 90s to mid 80s and placed on 2Lnc.  Labs: WBC 9.2, hemoglobin 12, platelets 216, creatinine 0.77, BNP 308.   CTA negative for PE, small bilateral pleural effusions but concern for possible bibasilar atelectasis versus consolidation.  Positive pulmonary edema. Esophagus is filled with fluid and debris.   They were initially treated with rocephin and azithromycin and IV diuresis.    Patient was admitted to medicine service for further workup and management of CAP as outlined in detail below.  Acute respiratory failure with hypoxia (HCC)- BNP 300 CTA negative PE however showing pulmonary edema as well as Infiltrate CAP  CHF exacerbation She  was weaned to room air without respiratory distress while resting but intermittently had low oxygen supplementation requirement of 1L while sleeping or exerting herself. She was continued on it for the remainder of hospitalization and will be at discharge as well.  She received IV antibiotics for PNA and was transitioned to PO at discharge to complete her treatment. Did not have significant cough, respiratory viral panel negative.  She may be able to wean from oxygen with further recovery. She wished to return back to facility today with supplement.   Acute on chronic HFpEF [facility])- echo September 2024 with EF of 50 to 55% and grade 1 diastolic dysfunction. BNP of 300. Chronically takes torsemide daily. Was treated with IV and then PO lasix while inpatient with >4L UOP during inpatient stay. She appeared euvolemic on exam on day of discharge. Renal function was normal and electrolytes WNL. Continue her home torsemide at dc.   Nausea- She states that she chronically has nausea in the mornings and her symptoms remained unchanged while hospitalized. She is able to tolerate a diet throughout the day without emesis. I recommend a mechanically soft diet due to difficulty chewing with her dentures. Would benefit from further SLP evaluation.   Discharge Condition: Stable, improved Recommended discharge diet: dysphagia 3 mechanical soft,w/ MINCED MEATS, gravies added. Thin liquids. Aspiration precautions. Pills in Puree. Supervision at all meals.   Consultations: None   Procedures/Studies: None   Allergies as of 10/06/2023       Reactions   Doxycycline Hyclate Nausea Only   Levofloxacin Nausea And Vomiting   Sulfa Antibiotics Rash   Other Reaction: Throat feels funny  Physician Discharge Summary  Patient: Amy [name] [id number] DOB: [birth year]   Code Status: Limited: Do not attempt resuscitation (DNR) -DNR-LIMITED -Do Not Intubate/DNI  Admit date: 10/03/2023 Discharge date: 10/06/2023 Disposition: Skilled nursing facility, PT, OT, SLP, nurse aid, and RN PCP: Danella Penton, MD  Recommendations for Outpatient Follow-up:  Follow up with PCP within 1-2 weeks Regarding general hospital follow up and preventative care Recommend evaluating oxygen requirement and wean from oxygen as tolerated Assess fluid status and electrolytes. Discharged on chronic torsemide.  Discharge Diagnoses:  Principal Problem:   Acute respiratory failure with hypoxia (HCC) Active Problems:   Pneumonia   Acute on chronic heart failure with preserved ejection fraction (HFpEF) (HCC)   CAD in native artery   Benign essential HTN   Dementia (HCC)   Acute on chronic congestive heart failure [facility])   Community acquired pneumonia  Brief Hospital Course Summary: Amy [name] is a 89 y.o. female with medical history significant of GERD, hypertension, depression, anxiety, CAD. They presented with acute respiratory failure with hypoxia for 1 to 2 days.     Presented to the ER afebrile, hemodynamically stable.  O2 sats in the low 90s to mid 80s and placed on 2Lnc.  Labs: WBC 9.2, hemoglobin 12, platelets 216, creatinine 0.77, BNP 308.   CTA negative for PE, small bilateral pleural effusions but concern for possible bibasilar atelectasis versus consolidation.  Positive pulmonary edema. Esophagus is filled with fluid and debris.   They were initially treated with rocephin and azithromycin and IV diuresis.    Patient was admitted to medicine service for further workup and management of CAP as outlined in detail below.  Acute respiratory failure with hypoxia (HCC)- BNP 300 CTA negative PE however showing pulmonary edema as well as Infiltrate CAP  CHF exacerbation She  was weaned to room air without respiratory distress while resting but intermittently had low oxygen supplementation requirement of 1L while sleeping or exerting herself. She was continued on it for the remainder of hospitalization and will be at discharge as well.  She received IV antibiotics for PNA and was transitioned to PO at discharge to complete her treatment. Did not have significant cough, respiratory viral panel negative.  She may be able to wean from oxygen with further recovery. She wished to return back to facility today with supplement.   Acute on chronic HFpEF [facility])- echo September 2024 with EF of 50 to 55% and grade 1 diastolic dysfunction. BNP of 300. Chronically takes torsemide daily. Was treated with IV and then PO lasix while inpatient with >4L UOP during inpatient stay. She appeared euvolemic on exam on day of discharge. Renal function was normal and electrolytes WNL. Continue her home torsemide at dc.   Nausea- She states that she chronically has nausea in the mornings and her symptoms remained unchanged while hospitalized. She is able to tolerate a diet throughout the day without emesis. I recommend a mechanically soft diet due to difficulty chewing with her dentures. Would benefit from further SLP evaluation.   Discharge Condition: Stable, improved Recommended discharge diet: dysphagia 3 mechanical soft,w/ MINCED MEATS, gravies added. Thin liquids. Aspiration precautions. Pills in Puree. Supervision at all meals.   Consultations: None   Procedures/Studies: None   Allergies as of 10/06/2023       Reactions   Doxycycline Hyclate Nausea Only   Levofloxacin Nausea And Vomiting   Sulfa Antibiotics Rash   Other Reaction: Throat feels funny  Physician Discharge Summary  Patient: Amy [name] [id number] DOB: [birth year]   Code Status: Limited: Do not attempt resuscitation (DNR) -DNR-LIMITED -Do Not Intubate/DNI  Admit date: 10/03/2023 Discharge date: 10/06/2023 Disposition: Skilled nursing facility, PT, OT, SLP, nurse aid, and RN PCP: Danella Penton, MD  Recommendations for Outpatient Follow-up:  Follow up with PCP within 1-2 weeks Regarding general hospital follow up and preventative care Recommend evaluating oxygen requirement and wean from oxygen as tolerated Assess fluid status and electrolytes. Discharged on chronic torsemide.  Discharge Diagnoses:  Principal Problem:   Acute respiratory failure with hypoxia (HCC) Active Problems:   Pneumonia   Acute on chronic heart failure with preserved ejection fraction (HFpEF) (HCC)   CAD in native artery   Benign essential HTN   Dementia (HCC)   Acute on chronic congestive heart failure [facility])   Community acquired pneumonia  Brief Hospital Course Summary: Amy [name] is a 89 y.o. female with medical history significant of GERD, hypertension, depression, anxiety, CAD. They presented with acute respiratory failure with hypoxia for 1 to 2 days.     Presented to the ER afebrile, hemodynamically stable.  O2 sats in the low 90s to mid 80s and placed on 2Lnc.  Labs: WBC 9.2, hemoglobin 12, platelets 216, creatinine 0.77, BNP 308.   CTA negative for PE, small bilateral pleural effusions but concern for possible bibasilar atelectasis versus consolidation.  Positive pulmonary edema. Esophagus is filled with fluid and debris.   They were initially treated with rocephin and azithromycin and IV diuresis.    Patient was admitted to medicine service for further workup and management of CAP as outlined in detail below.  Acute respiratory failure with hypoxia (HCC)- BNP 300 CTA negative PE however showing pulmonary edema as well as Infiltrate CAP  CHF exacerbation She  was weaned to room air without respiratory distress while resting but intermittently had low oxygen supplementation requirement of 1L while sleeping or exerting herself. She was continued on it for the remainder of hospitalization and will be at discharge as well.  She received IV antibiotics for PNA and was transitioned to PO at discharge to complete her treatment. Did not have significant cough, respiratory viral panel negative.  She may be able to wean from oxygen with further recovery. She wished to return back to facility today with supplement.   Acute on chronic HFpEF [facility])- echo September 2024 with EF of 50 to 55% and grade 1 diastolic dysfunction. BNP of 300. Chronically takes torsemide daily. Was treated with IV and then PO lasix while inpatient with >4L UOP during inpatient stay. She appeared euvolemic on exam on day of discharge. Renal function was normal and electrolytes WNL. Continue her home torsemide at dc.   Nausea- She states that she chronically has nausea in the mornings and her symptoms remained unchanged while hospitalized. She is able to tolerate a diet throughout the day without emesis. I recommend a mechanically soft diet due to difficulty chewing with her dentures. Would benefit from further SLP evaluation.   Discharge Condition: Stable, improved Recommended discharge diet: dysphagia 3 mechanical soft,w/ MINCED MEATS, gravies added. Thin liquids. Aspiration precautions. Pills in Puree. Supervision at all meals.   Consultations: None   Procedures/Studies: None   Allergies as of 10/06/2023       Reactions   Doxycycline Hyclate Nausea Only   Levofloxacin Nausea And Vomiting   Sulfa Antibiotics Rash   Other Reaction: Throat feels funny  bilateral pleural effusions. 2. Moderate sized hiatal hernia. Electronically Signed   By: Jules Schick M.D.   On: 10/03/2023 10:18   DG Chest 2 View  Result Date: 09/29/2023 CLINICAL DATA:  Dyspnea EXAM: CHEST - 2 VIEW COMPARISON:  09/14/2023 FINDINGS: Small bilateral pleural effusions are present, stable since prior examination. No superimposed confluent pulmonary infiltrate. No pneumothorax. Cardiac size is within normal limits. Pulmonary vascularity is normal. T11 and T12 vertebroplasty noted. IMPRESSION: 1. Small bilateral pleural  effusions. Electronically Signed   By: Helyn Numbers M.D.   On: 09/29/2023 00:53   DG Shoulder Left  Result Date: 09/28/2023 CLINICAL DATA:  Left shoulder pain following physical therapy today. EXAM: LEFT SHOULDER - 2+ VIEW COMPARISON:  None Available. FINDINGS: There is no evidence of fracture or dislocation. There is no evidence of glenohumeral arthropathy or other focal bone abnormality. Mild osteopenia. Mild spurring AC joint without AC separation. Soft tissues are unremarkable. There is artifact from overlying clothing. Thoracic aorta is tortuous and heavily calcified. Small left pleural effusion. Overlying left basilar atelectasis or consolidation. Most recent chest study is CTA chest 09/14/2023 with similar findings on this study. Kyphoplasty cement is again noted in the T11 and 12 vertebral bodies. IMPRESSION: 1. No evidence of fracture or dislocation. Mild osteopenia. 2. Degenerative spurring AC joint. 3. Small left pleural effusion with overlying atelectasis or consolidation. 4. Aortic atherosclerosis. Electronically Signed   By: Almira Bar M.D.   On: 09/28/2023 21:17   ECHOCARDIOGRAM COMPLETE  Result Date: 09/16/2023    ECHOCARDIOGRAM REPORT   Patient Name:   Amy [name] [facility] Date of Exam: 09/15/2023 Medical Rec #:  [id number]         Height:       65.0 in Accession #:    [id number]        Weight:       144.0 lb Date of Birth:  25-Jun-1929         BSA:          1.720 m Patient Age:    89 years          BP:           169/69 mmHg Patient Gender: F                 HR:           75 bpm. Exam Location:  [facility] Procedure: 2D Echo, Cardiac Doppler and Color Doppler Indications:     R06.00 Dyspnea  History:         Patient has prior history of Echocardiogram examinations, most                  recent 09/12/2016. Signs/Symptoms:Dyspnea; Risk                  Factors:Hypertension and Dyslipidemia.  Sonographer:     Daphine Deutscher RDCS Referring Phys:  [id number] Wilfred Curtis VQQV Diagnosing Phys:  Marcina Millard MD IMPRESSIONS  1. Left ventricular ejection fraction, by estimation, is 60 to 65%. The left ventricle has normal function. The left ventricle has no regional wall motion abnormalities. Left ventricular diastolic parameters are consistent with Grade I diastolic dysfunction (impaired relaxation).  2. Right ventricular systolic function is normal. The right ventricular size is normal.  3. The mitral valve is normal in structure. No evidence of mitral valve regurgitation. No evidence of mitral stenosis.  4. The aortic valve is normal in structure. Aortic valve regurgitation is trivial. No aortic stenosis  bilateral pleural effusions. 2. Moderate sized hiatal hernia. Electronically Signed   By: Jules Schick M.D.   On: 10/03/2023 10:18   DG Chest 2 View  Result Date: 09/29/2023 CLINICAL DATA:  Dyspnea EXAM: CHEST - 2 VIEW COMPARISON:  09/14/2023 FINDINGS: Small bilateral pleural effusions are present, stable since prior examination. No superimposed confluent pulmonary infiltrate. No pneumothorax. Cardiac size is within normal limits. Pulmonary vascularity is normal. T11 and T12 vertebroplasty noted. IMPRESSION: 1. Small bilateral pleural  effusions. Electronically Signed   By: Helyn Numbers M.D.   On: 09/29/2023 00:53   DG Shoulder Left  Result Date: 09/28/2023 CLINICAL DATA:  Left shoulder pain following physical therapy today. EXAM: LEFT SHOULDER - 2+ VIEW COMPARISON:  None Available. FINDINGS: There is no evidence of fracture or dislocation. There is no evidence of glenohumeral arthropathy or other focal bone abnormality. Mild osteopenia. Mild spurring AC joint without AC separation. Soft tissues are unremarkable. There is artifact from overlying clothing. Thoracic aorta is tortuous and heavily calcified. Small left pleural effusion. Overlying left basilar atelectasis or consolidation. Most recent chest study is CTA chest 09/14/2023 with similar findings on this study. Kyphoplasty cement is again noted in the T11 and 12 vertebral bodies. IMPRESSION: 1. No evidence of fracture or dislocation. Mild osteopenia. 2. Degenerative spurring AC joint. 3. Small left pleural effusion with overlying atelectasis or consolidation. 4. Aortic atherosclerosis. Electronically Signed   By: Almira Bar M.D.   On: 09/28/2023 21:17   ECHOCARDIOGRAM COMPLETE  Result Date: 09/16/2023    ECHOCARDIOGRAM REPORT   Patient Name:   Amy [name] [facility] Date of Exam: 09/15/2023 Medical Rec #:  [id number]         Height:       65.0 in Accession #:    [id number]        Weight:       144.0 lb Date of Birth:  25-Jun-1929         BSA:          1.720 m Patient Age:    89 years          BP:           169/69 mmHg Patient Gender: F                 HR:           75 bpm. Exam Location:  [facility] Procedure: 2D Echo, Cardiac Doppler and Color Doppler Indications:     R06.00 Dyspnea  History:         Patient has prior history of Echocardiogram examinations, most                  recent 09/12/2016. Signs/Symptoms:Dyspnea; Risk                  Factors:Hypertension and Dyslipidemia.  Sonographer:     Daphine Deutscher RDCS Referring Phys:  [id number] Wilfred Curtis VQQV Diagnosing Phys:  Marcina Millard MD IMPRESSIONS  1. Left ventricular ejection fraction, by estimation, is 60 to 65%. The left ventricle has normal function. The left ventricle has no regional wall motion abnormalities. Left ventricular diastolic parameters are consistent with Grade I diastolic dysfunction (impaired relaxation).  2. Right ventricular systolic function is normal. The right ventricular size is normal.  3. The mitral valve is normal in structure. No evidence of mitral valve regurgitation. No evidence of mitral stenosis.  4. The aortic valve is normal in structure. Aortic valve regurgitation is trivial. No aortic stenosis  Physician Discharge Summary  Patient: Amy [name] [id number] DOB: [birth year]   Code Status: Limited: Do not attempt resuscitation (DNR) -DNR-LIMITED -Do Not Intubate/DNI  Admit date: 10/03/2023 Discharge date: 10/06/2023 Disposition: Skilled nursing facility, PT, OT, SLP, nurse aid, and RN PCP: Danella Penton, MD  Recommendations for Outpatient Follow-up:  Follow up with PCP within 1-2 weeks Regarding general hospital follow up and preventative care Recommend evaluating oxygen requirement and wean from oxygen as tolerated Assess fluid status and electrolytes. Discharged on chronic torsemide.  Discharge Diagnoses:  Principal Problem:   Acute respiratory failure with hypoxia (HCC) Active Problems:   Pneumonia   Acute on chronic heart failure with preserved ejection fraction (HFpEF) (HCC)   CAD in native artery   Benign essential HTN   Dementia (HCC)   Acute on chronic congestive heart failure [facility])   Community acquired pneumonia  Brief Hospital Course Summary: Amy [name] is a 89 y.o. female with medical history significant of GERD, hypertension, depression, anxiety, CAD. They presented with acute respiratory failure with hypoxia for 1 to 2 days.     Presented to the ER afebrile, hemodynamically stable.  O2 sats in the low 90s to mid 80s and placed on 2Lnc.  Labs: WBC 9.2, hemoglobin 12, platelets 216, creatinine 0.77, BNP 308.   CTA negative for PE, small bilateral pleural effusions but concern for possible bibasilar atelectasis versus consolidation.  Positive pulmonary edema. Esophagus is filled with fluid and debris.   They were initially treated with rocephin and azithromycin and IV diuresis.    Patient was admitted to medicine service for further workup and management of CAP as outlined in detail below.  Acute respiratory failure with hypoxia (HCC)- BNP 300 CTA negative PE however showing pulmonary edema as well as Infiltrate CAP  CHF exacerbation She  was weaned to room air without respiratory distress while resting but intermittently had low oxygen supplementation requirement of 1L while sleeping or exerting herself. She was continued on it for the remainder of hospitalization and will be at discharge as well.  She received IV antibiotics for PNA and was transitioned to PO at discharge to complete her treatment. Did not have significant cough, respiratory viral panel negative.  She may be able to wean from oxygen with further recovery. She wished to return back to facility today with supplement.   Acute on chronic HFpEF [facility])- echo September 2024 with EF of 50 to 55% and grade 1 diastolic dysfunction. BNP of 300. Chronically takes torsemide daily. Was treated with IV and then PO lasix while inpatient with >4L UOP during inpatient stay. She appeared euvolemic on exam on day of discharge. Renal function was normal and electrolytes WNL. Continue her home torsemide at dc.   Nausea- She states that she chronically has nausea in the mornings and her symptoms remained unchanged while hospitalized. She is able to tolerate a diet throughout the day without emesis. I recommend a mechanically soft diet due to difficulty chewing with her dentures. Would benefit from further SLP evaluation.   Discharge Condition: Stable, improved Recommended discharge diet: dysphagia 3 mechanical soft,w/ MINCED MEATS, gravies added. Thin liquids. Aspiration precautions. Pills in Puree. Supervision at all meals.   Consultations: None   Procedures/Studies: None   Allergies as of 10/06/2023       Reactions   Doxycycline Hyclate Nausea Only   Levofloxacin Nausea And Vomiting   Sulfa Antibiotics Rash   Other Reaction: Throat feels funny  Physician Discharge Summary  Patient: Amy [name] [id number] DOB: [birth year]   Code Status: Limited: Do not attempt resuscitation (DNR) -DNR-LIMITED -Do Not Intubate/DNI  Admit date: 10/03/2023 Discharge date: 10/06/2023 Disposition: Skilled nursing facility, PT, OT, SLP, nurse aid, and RN PCP: Danella Penton, MD  Recommendations for Outpatient Follow-up:  Follow up with PCP within 1-2 weeks Regarding general hospital follow up and preventative care Recommend evaluating oxygen requirement and wean from oxygen as tolerated Assess fluid status and electrolytes. Discharged on chronic torsemide.  Discharge Diagnoses:  Principal Problem:   Acute respiratory failure with hypoxia (HCC) Active Problems:   Pneumonia   Acute on chronic heart failure with preserved ejection fraction (HFpEF) (HCC)   CAD in native artery   Benign essential HTN   Dementia (HCC)   Acute on chronic congestive heart failure [facility])   Community acquired pneumonia  Brief Hospital Course Summary: Amy [name] is a 89 y.o. female with medical history significant of GERD, hypertension, depression, anxiety, CAD. They presented with acute respiratory failure with hypoxia for 1 to 2 days.     Presented to the ER afebrile, hemodynamically stable.  O2 sats in the low 90s to mid 80s and placed on 2Lnc.  Labs: WBC 9.2, hemoglobin 12, platelets 216, creatinine 0.77, BNP 308.   CTA negative for PE, small bilateral pleural effusions but concern for possible bibasilar atelectasis versus consolidation.  Positive pulmonary edema. Esophagus is filled with fluid and debris.   They were initially treated with rocephin and azithromycin and IV diuresis.    Patient was admitted to medicine service for further workup and management of CAP as outlined in detail below.  Acute respiratory failure with hypoxia (HCC)- BNP 300 CTA negative PE however showing pulmonary edema as well as Infiltrate CAP  CHF exacerbation She  was weaned to room air without respiratory distress while resting but intermittently had low oxygen supplementation requirement of 1L while sleeping or exerting herself. She was continued on it for the remainder of hospitalization and will be at discharge as well.  She received IV antibiotics for PNA and was transitioned to PO at discharge to complete her treatment. Did not have significant cough, respiratory viral panel negative.  She may be able to wean from oxygen with further recovery. She wished to return back to facility today with supplement.   Acute on chronic HFpEF [facility])- echo September 2024 with EF of 50 to 55% and grade 1 diastolic dysfunction. BNP of 300. Chronically takes torsemide daily. Was treated with IV and then PO lasix while inpatient with >4L UOP during inpatient stay. She appeared euvolemic on exam on day of discharge. Renal function was normal and electrolytes WNL. Continue her home torsemide at dc.   Nausea- She states that she chronically has nausea in the mornings and her symptoms remained unchanged while hospitalized. She is able to tolerate a diet throughout the day without emesis. I recommend a mechanically soft diet due to difficulty chewing with her dentures. Would benefit from further SLP evaluation.   Discharge Condition: Stable, improved Recommended discharge diet: dysphagia 3 mechanical soft,w/ MINCED MEATS, gravies added. Thin liquids. Aspiration precautions. Pills in Puree. Supervision at all meals.   Consultations: None   Procedures/Studies: None   Allergies as of 10/06/2023       Reactions   Doxycycline Hyclate Nausea Only   Levofloxacin Nausea And Vomiting   Sulfa Antibiotics Rash   Other Reaction: Throat feels funny  Physician Discharge Summary  Patient: Amy [name] [id number] DOB: [birth year]   Code Status: Limited: Do not attempt resuscitation (DNR) -DNR-LIMITED -Do Not Intubate/DNI  Admit date: 10/03/2023 Discharge date: 10/06/2023 Disposition: Skilled nursing facility, PT, OT, SLP, nurse aid, and RN PCP: Danella Penton, MD  Recommendations for Outpatient Follow-up:  Follow up with PCP within 1-2 weeks Regarding general hospital follow up and preventative care Recommend evaluating oxygen requirement and wean from oxygen as tolerated Assess fluid status and electrolytes. Discharged on chronic torsemide.  Discharge Diagnoses:  Principal Problem:   Acute respiratory failure with hypoxia (HCC) Active Problems:   Pneumonia   Acute on chronic heart failure with preserved ejection fraction (HFpEF) (HCC)   CAD in native artery   Benign essential HTN   Dementia (HCC)   Acute on chronic congestive heart failure [facility])   Community acquired pneumonia  Brief Hospital Course Summary: Amy [name] is a 89 y.o. female with medical history significant of GERD, hypertension, depression, anxiety, CAD. They presented with acute respiratory failure with hypoxia for 1 to 2 days.     Presented to the ER afebrile, hemodynamically stable.  O2 sats in the low 90s to mid 80s and placed on 2Lnc.  Labs: WBC 9.2, hemoglobin 12, platelets 216, creatinine 0.77, BNP 308.   CTA negative for PE, small bilateral pleural effusions but concern for possible bibasilar atelectasis versus consolidation.  Positive pulmonary edema. Esophagus is filled with fluid and debris.   They were initially treated with rocephin and azithromycin and IV diuresis.    Patient was admitted to medicine service for further workup and management of CAP as outlined in detail below.  Acute respiratory failure with hypoxia (HCC)- BNP 300 CTA negative PE however showing pulmonary edema as well as Infiltrate CAP  CHF exacerbation She  was weaned to room air without respiratory distress while resting but intermittently had low oxygen supplementation requirement of 1L while sleeping or exerting herself. She was continued on it for the remainder of hospitalization and will be at discharge as well.  She received IV antibiotics for PNA and was transitioned to PO at discharge to complete her treatment. Did not have significant cough, respiratory viral panel negative.  She may be able to wean from oxygen with further recovery. She wished to return back to facility today with supplement.   Acute on chronic HFpEF [facility])- echo September 2024 with EF of 50 to 55% and grade 1 diastolic dysfunction. BNP of 300. Chronically takes torsemide daily. Was treated with IV and then PO lasix while inpatient with >4L UOP during inpatient stay. She appeared euvolemic on exam on day of discharge. Renal function was normal and electrolytes WNL. Continue her home torsemide at dc.   Nausea- She states that she chronically has nausea in the mornings and her symptoms remained unchanged while hospitalized. She is able to tolerate a diet throughout the day without emesis. I recommend a mechanically soft diet due to difficulty chewing with her dentures. Would benefit from further SLP evaluation.   Discharge Condition: Stable, improved Recommended discharge diet: dysphagia 3 mechanical soft,w/ MINCED MEATS, gravies added. Thin liquids. Aspiration precautions. Pills in Puree. Supervision at all meals.   Consultations: None   Procedures/Studies: None   Allergies as of 10/06/2023       Reactions   Doxycycline Hyclate Nausea Only   Levofloxacin Nausea And Vomiting   Sulfa Antibiotics Rash   Other Reaction: Throat feels funny  bilateral pleural effusions. 2. Moderate sized hiatal hernia. Electronically Signed   By: Jules Schick M.D.   On: 10/03/2023 10:18   DG Chest 2 View  Result Date: 09/29/2023 CLINICAL DATA:  Dyspnea EXAM: CHEST - 2 VIEW COMPARISON:  09/14/2023 FINDINGS: Small bilateral pleural effusions are present, stable since prior examination. No superimposed confluent pulmonary infiltrate. No pneumothorax. Cardiac size is within normal limits. Pulmonary vascularity is normal. T11 and T12 vertebroplasty noted. IMPRESSION: 1. Small bilateral pleural  effusions. Electronically Signed   By: Helyn Numbers M.D.   On: 09/29/2023 00:53   DG Shoulder Left  Result Date: 09/28/2023 CLINICAL DATA:  Left shoulder pain following physical therapy today. EXAM: LEFT SHOULDER - 2+ VIEW COMPARISON:  None Available. FINDINGS: There is no evidence of fracture or dislocation. There is no evidence of glenohumeral arthropathy or other focal bone abnormality. Mild osteopenia. Mild spurring AC joint without AC separation. Soft tissues are unremarkable. There is artifact from overlying clothing. Thoracic aorta is tortuous and heavily calcified. Small left pleural effusion. Overlying left basilar atelectasis or consolidation. Most recent chest study is CTA chest 09/14/2023 with similar findings on this study. Kyphoplasty cement is again noted in the T11 and 12 vertebral bodies. IMPRESSION: 1. No evidence of fracture or dislocation. Mild osteopenia. 2. Degenerative spurring AC joint. 3. Small left pleural effusion with overlying atelectasis or consolidation. 4. Aortic atherosclerosis. Electronically Signed   By: Almira Bar M.D.   On: 09/28/2023 21:17   ECHOCARDIOGRAM COMPLETE  Result Date: 09/16/2023    ECHOCARDIOGRAM REPORT   Patient Name:   Amy [name] [facility] Date of Exam: 09/15/2023 Medical Rec #:  [id number]         Height:       65.0 in Accession #:    [id number]        Weight:       144.0 lb Date of Birth:  25-Jun-1929         BSA:          1.720 m Patient Age:    89 years          BP:           169/69 mmHg Patient Gender: F                 HR:           75 bpm. Exam Location:  [facility] Procedure: 2D Echo, Cardiac Doppler and Color Doppler Indications:     R06.00 Dyspnea  History:         Patient has prior history of Echocardiogram examinations, most                  recent 09/12/2016. Signs/Symptoms:Dyspnea; Risk                  Factors:Hypertension and Dyslipidemia.  Sonographer:     Daphine Deutscher RDCS Referring Phys:  [id number] Wilfred Curtis VQQV Diagnosing Phys:  Marcina Millard MD IMPRESSIONS  1. Left ventricular ejection fraction, by estimation, is 60 to 65%. The left ventricle has normal function. The left ventricle has no regional wall motion abnormalities. Left ventricular diastolic parameters are consistent with Grade I diastolic dysfunction (impaired relaxation).  2. Right ventricular systolic function is normal. The right ventricular size is normal.  3. The mitral valve is normal in structure. No evidence of mitral valve regurgitation. No evidence of mitral stenosis.  4. The aortic valve is normal in structure. Aortic valve regurgitation is trivial. No aortic stenosis

## 2023-10-06 NOTE — Evaluation (Signed)
Clinical/Bedside Swallow Evaluation Patient Details  Name: Amy [name] MRN: [id number] Date of Birth: Jun 20, 1929  Today's Date: 10/06/2023 Time: SLP Start Time (ACUTE ONLY): 1245 SLP Stop Time (ACUTE ONLY): 1340 SLP Time Calculation (min) (ACUTE ONLY): 55 min  Past Medical History:  Past Medical History:  Diagnosis Date   Arthritis    Depression    Diverticulitis    DVT (deep venous thrombosis) (HCC)    Right leg    Dyspnea    Family history of adverse reaction to anesthesia    PONV sister   GERD (gastroesophageal reflux disease)    History of hiatal hernia    Hyperlipidemia    Hypertension    Mitral valve regurgitation    Neuromuscular disorder (HCC)    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HCC)    Pneumonia    Renal disorder    Past Surgical History:  Past Surgical History:  Procedure Laterality Date   ABDOMINAL HYSTERECTOMY     partial   ABDOMINAL SURGERY     bladder tack  1990   BREAST BIOPSY  1974   CATARACT EXTRACTION, BILATERAL     CERVICAL SPINE SURGERY     tumor removed   HERNIA REPAIR     KYPHOPLASTY N/A 03/12/2018   Procedure: [id number];  Surgeon: Kennedy Bucker, MD;  Location: [facility] ORS;  Service: Orthopedics;  Laterality: N/A;   KYPHOPLASTY N/A 06/30/2018   Procedure: Shauna Hugh;  Surgeon: Kennedy Bucker, MD;  Location: [facility] ORS;  Service: Orthopedics;  Laterality: N/A;   plasmacytoma resection  2005   lumbar   HPI:  Pt is a 89 y.o. female with medical history significant of Dementia, Large Hiatal Hernia per Imaging, GERD, CHF, frequent UTIs, HTN, LE edema, Falls, depression, anxiety, Plasmacytoma, CAD presenting with acute respiratory failure with hypoxia.  History primarily from family as well as patient's primary caregiver.  Per report patient with increased work of breathing and shortness of breath over the past 1 to 2 days.  Noted to have been admitted in the hospital at the end of September for similar symptoms.  Volume overload.  Was discharged on Lasix.   However, family unsure if patient was getting on a regular basis.  No reported high salt or NSAID use.  No fevers or chills.  No reported cough or wheezing.  Was noted to be hypoxic at [facility] care.  Imaging this admit: bilateral pleural effusions but concern for possible bibasilar atelectasis versus consolidation.  Positive pulmonary edema.  Esophagus is filled with fluid and debris.  Per MD this admit, "Noted food in Esophagus-may be an element of recurrent aspiration" of this REFLUX material secondary to Esophageal phase Dysmotility (this would warrant GI f/u for further).    Assessment / Plan / Recommendation  Clinical Impression   Pt seen for BSE today. Pt awake, sitting in chair w/ Stuffed Animals in her lap. She verbally responded to 4-5 basic questions re: her wants/needs. Followed basic instructions w/ cues. Eyes closed often. Pt has Dementia per MD; also has a Large Hiatal Hernia per Imaging last admit. Pt also has Baseline Vision deficits impacting her self-feeding and overall weakness; Advanced Age.  Personal Caregiver present.  On [place] O2 2L; afebrile. WBC WNL.  Pt appears to present w/ grossly functional oropharyngeal phase swallowing w/ slightly - min increased oral phase time w/ increased textured foods -- min lengthy mastication w/ certain textures(meats) has been reported by Caregiver, Family; suspect d/t Ill-Fitting Dentures(U/L present) and Cognitive impact/attention. No overt clinical s/s of aspiration during the  po trials. Pt also has a Large Hiatal Hernia dx'd(per Imaging) w/ suspected slower Esophageal phase motility and impact from such on her overall oral intake. Pt is of Advanced Age, 89y, which can also impact oral intake in general.   Pt appears at reduced risk for aspiration when following general aspiration and REFLUX precautions and supported w/ a more soft, chopped foods(Meats Minced) in her diet for ease of chewing/intake overall. Supervision at meals for support d/t  Baseline Vision deficits and follow through w/ precautions.  Pt appears to have challenging factors that could impact her oropharyngeal swallowing to include Large Hiatal Hernia and Esophageal phase Dysmotility, fatigue/weakness, Dementia, Vision deficits impacting self-feeding, Ill-Fitting Dentures impacting mastication efficiency, and Advanced Age.  OF NOTE: w/ the Dx'd Large Hiatal Hernia, ANY Esophageal phase Dysmotility can increase risk for Retrograde flow and aspiration of REFLUX material especially if lying down post meals.  All of the above factors can increase risk for aspiration, dysphagia as well as decreased oral intake overall.    During po trials, pt consumed few trials of each texture; few bites of her Lunch meal items w/ the Caregiver(meatloaf, carrots, tea). Thin liquids were consumed via straw w/ feeding support but pt was able to hold the Cup. No immediate, overt coughing, decline in vocal quality, or change in respiratory presentation during/post trials noted. Rest breaks and alternating foods/liquids were given w/ trials to support Esophageal clearing. Oral phase appeared grossly WFL bolus management, functional for mastication of softened, broken-down, moistened solids - min increased Time to fully chew/clear orally. Overall functional w/ for oral phase management/oral clearing w/ well-chopped foods.      Recommend continue a more Mech Soft diet w/ Meats MINCED w/ well-moistened foods(pt has had her foods modified per previous eval report: "cutting up her foods real small at home prior to moving to the Facility", per Daughter). Thin liquids -- pt should help to Hold Cup when drinking. Monitor any straw use. Recommend general aspiration precautions, tray setup and sitting support. Reduce distractions at meals. Rest Breaks for conservation of energy and Esophageal clearing. Support w/ feeding as needed d/t Vision deficits and overall weakness.  STRICT REFLUX PRECAUTIONS d/t Large Hiatal  Hernia. Pills CRUSHED vs Whole in Puree for safer, easier swallowing.    Thorough Education given on mincing/mashing foods/consistencies and choosing easy to eat options; identifying foods pt likes to eat to support intake; general aspiration precautions; Pills in Puree to pt and Caregiver present; posted in room/chart. REFLUX precautions. Handouts have been given on this last admit.   No further ST services indicated currently as pt appears at her Baseline. MD/NSG updated, agreed.  Recommend Dietician f/u for support. Recommend Palliative Care consult for GOC discussion per Daughter's description of overall decline in functioning at advanced age (last admit) and d/t pt's Chronic issue of Esophageal phase Dysmotility increasing risk for aspiration of REFLUX material thus pulmonary impact.  SLP Visit Diagnosis: Dysphagia, pharyngoesophageal phase (R13.14) (Esophageal phase dysmotility suspected; Large Hiatal Hernia per chart; Dementia/Cognitive decline at baseline which could impact intake)    Aspiration Risk  Mild aspiration risk;Risk for inadequate nutrition/hydration (of REFLUX material; reduced when following precautions)    Diet Recommendation   Thin;Dysphagia 3 (mechanical soft) (MINCED meats, gravies) = a more Mech Soft diet w/ Meats MINCED w/ well-moistened foods(pt has had her foods modified per previous eval report: "cutting up her foods real small at home prior to moving to the Facility", per Daughter). Thin liquids -- pt should help to  Hold Cup when drinking. Monitor any straw use. Recommend general aspiration precautions, tray setup and sitting support. Reduce distractions at meals. Rest Breaks for conservation of energy and Esophageal clearing. Support w/ feeding as needed d/t Vision deficits and overall weakness.  STRICT REFLUX PRECAUTIONS d/t Large Hiatal Hernia.  Medication Administration: Whole meds with puree (vs CRUSHED in Puree as needed to per NSG)    Other  Recommendations  Recommended Consults: Consider GI evaluation;Consider esophageal assessment (Palliative Care f/u for education/GOC; Dietician) Oral Care Recommendations: Oral care BID;Oral care before and after PO;Staff/trained caregiver to provide oral care (denture care)    Recommendations for follow up therapy are one component of a multi-disciplinary discharge planning process, led by the attending physician.  Recommendations may be updated based on patient status, additional functional criteria and insurance authorization.  Follow up Recommendations No SLP follow up      Assistance Recommended at Discharge  full  Functional Status Assessment Patient has had a recent decline in their functional status and/or demonstrates limited ability to make significant improvements in function in a reasonable and predictable amount of time  Frequency and Duration  (n/a)   (n/a)       Prognosis Prognosis for improved oropharyngeal function: Fair Barriers to Reach Goals: Cognitive deficits;Time post onset;Severity of deficits Barriers/Prognosis Comment: Esophageal phase dysmotility suspected; Large Hiatal Hernia per chart; Dementia/Cognitive decline at baseline which could impact intake; need for Feeding support at meals      Swallow Study   General Date of Onset: 10/03/23 HPI: Pt is a 89 y.o. female with medical history significant of Dementia, Large Hiatal Hernia per Imaging, GERD, CHF, frequent UTIs, HTN, LE edema, Falls, depression, anxiety, Plasmacytoma, CAD presenting with acute respiratory failure with hypoxia.  History primarily from family as well as patient's primary caregiver.  Per report patient with increased work of breathing and shortness of breath over the past 1 to 2 days.  Noted to have been admitted in the hospital at the end of September for similar symptoms.  Volume overload.  Was discharged on Lasix.  However, family unsure if patient was getting on a regular basis.  No reported high salt or NSAID  use.  No fevers or chills.  No reported cough or wheezing.  Was noted to be hypoxic at [facility] care.  Imaging this admit: bilateral pleural effusions but concern for possible bibasilar atelectasis versus consolidation.  Positive pulmonary edema.  Esophagus is filled with fluid and debris.  Per MD this admit, "Noted food in Esophagus-may be an element of recurrent aspiration" of this REFLUX material secondary to Esophageal phase Dysmotility (this would warrant GI f/u for further). Type of Study: Bedside Swallow Evaluation Previous Swallow Assessment: 09/12/23 and 09/16/23 - mech soft diet w/ meats minced, moistened foods Diet Prior to this Study: Dysphagia 3 (mechanical soft);Thin liquids (Level 0) (supposed to be on this diet per recommendation) Temperature Spikes Noted: No (wbc 6.5) Respiratory Status: Nasal cannula (2L (prior too)) History of Recent Intubation: No Behavior/Cognition: Alert;Cooperative;Pleasant mood;Confused;Distractible;Requires cueing (baseline Dementia per MD) Oral Cavity Assessment: Within Functional Limits Oral Care Completed by SLP: Recent completion by staff Oral Cavity - Dentition: Dentures, top;Dentures, bottom (some degree of ill-fit of bottom dentures per Caregiver/Family report) Vision: Impaired for self-feeding (baseline vision deficits) Self-Feeding Abilities: Able to feed self with adaptive devices;Needs assist;Needs set up;Total assist (pt can hold cup to drink) Patient Positioning: Upright in chair Baseline Vocal Quality: Normal Volitional Cough: Strong    Oral/Motor/Sensory Function Overall Oral Motor/Sensory  Function: Within functional limits (no unilateral weakness during bolus management)   Ice Chips Ice chips: Not tested   Thin Liquid Thin Liquid: Within functional limits Presentation: Self Fed;Straw (8 sips) Other Comments: declined to take further sips/trials    Nectar Thick Nectar Thick Liquid: Not tested   Honey Thick Honey Thick Liquid: Not  tested   Puree Puree: Within functional limits Presentation: Spoon (fed; 3 trials) Other Comments: declined to take further sips/trials   Solid     Solid: Impaired (min) Presentation: Spoon (fed; 2 trials) Oral Phase Impairments: Impaired mastication (increased time, slow) Oral Phase Functional Implications: Impaired mastication (increased time, slow) Pharyngeal Phase Impairments:  (none) Other Comments: declined to take further sips/trials        Jerilynn Som, MS, CCC-SLP Speech Language Pathologist Rehab Services; [facility] [place] [phone] (ascom) Taunya Goral 10/06/2023,3:02 PM

## 2023-10-06 NOTE — Care Management Important Message (Signed)
Important Message  Patient Details  Name: Maecyn Panning MRN: [id number] Date of Birth: Dec 13, 1929   Important Message Given:  Yes - Medicare IM     Olegario Messier A Luverta Korte 10/06/2023, 1:45 PM

## 2023-12-26 ENCOUNTER — Encounter (INDEPENDENT_AMBULATORY_CARE_PROVIDER_SITE_OTHER): Payer: Medicare Other | Admitting: Ophthalmology

## 2023-12-26 DIAGNOSIS — H43813 Vitreous degeneration, bilateral: Secondary | ICD-10-CM | POA: Diagnosis not present

## 2023-12-26 DIAGNOSIS — H353231 Exudative age-related macular degeneration, bilateral, with active choroidal neovascularization: Secondary | ICD-10-CM

## 2023-12-26 DIAGNOSIS — H35033 Hypertensive retinopathy, bilateral: Secondary | ICD-10-CM

## 2023-12-26 DIAGNOSIS — I10 Essential (primary) hypertension: Secondary | ICD-10-CM

## 2024-01-05 ENCOUNTER — Ambulatory Visit: Payer: Medicare Other | Admitting: Podiatry

## 2024-01-26 ENCOUNTER — Encounter (INDEPENDENT_AMBULATORY_CARE_PROVIDER_SITE_OTHER): Payer: Medicare Other | Admitting: Ophthalmology

## 2024-01-26 DIAGNOSIS — H353231 Exudative age-related macular degeneration, bilateral, with active choroidal neovascularization: Secondary | ICD-10-CM | POA: Diagnosis not present

## 2024-01-26 DIAGNOSIS — H35033 Hypertensive retinopathy, bilateral: Secondary | ICD-10-CM | POA: Diagnosis not present

## 2024-01-26 DIAGNOSIS — I10 Essential (primary) hypertension: Secondary | ICD-10-CM | POA: Diagnosis not present

## 2024-01-26 DIAGNOSIS — H43813 Vitreous degeneration, bilateral: Secondary | ICD-10-CM | POA: Diagnosis not present

## 2024-01-28 ENCOUNTER — Ambulatory Visit (INDEPENDENT_AMBULATORY_CARE_PROVIDER_SITE_OTHER): Payer: Medicare Other | Admitting: Podiatry

## 2024-01-28 ENCOUNTER — Encounter: Payer: Self-pay | Admitting: Podiatry

## 2024-01-28 DIAGNOSIS — B351 Tinea unguium: Secondary | ICD-10-CM | POA: Diagnosis not present

## 2024-01-28 DIAGNOSIS — M79676 Pain in unspecified toe(s): Secondary | ICD-10-CM | POA: Diagnosis not present

## 2024-01-28 DIAGNOSIS — G5793 Unspecified mononeuropathy of bilateral lower limbs: Secondary | ICD-10-CM | POA: Diagnosis not present

## 2024-01-28 MED ORDER — GABAPENTIN 100 MG PO CAPS: 200.0000 mg | ORAL_CAPSULE | Freq: Every day | ORAL | 3 refills | Status: AC

## 2024-01-28 NOTE — Progress Notes (Signed)
 Subjective:  Patient ID: Amy [name], female    DOB: November 14, 1929,  MRN: [id number] HPI Chief Complaint  Patient presents with   Debridement    Requesting toenail trim - also she says plantar feet feel thick and are cold, toes burn, 2nd toe right is discolored   New Patient (Initial Visit)    89 y.o. female presents with the above complaint.   ROS: Denies fever chills nausea vomit muscle aches pains calf pain back pain chest pain shortness of breath.  Past Medical History:  Diagnosis Date   Arthritis    Depression    Diverticulitis    DVT (deep venous thrombosis) (HCC)    Right leg    Dyspnea    Family history of adverse reaction to anesthesia    PONV sister   GERD (gastroesophageal reflux disease)    History of hiatal hernia    Hyperlipidemia    Hypertension    Intractable vomiting with nausea 11/25/2015   Mitral valve regurgitation    Neuromuscular disorder (HCC)    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HCC)    Pneumonia    Renal disorder    Past Surgical History:  Procedure Laterality Date   ABDOMINAL HYSTERECTOMY     partial   ABDOMINAL SURGERY     bladder tack  1990   BREAST BIOPSY  1974   CATARACT EXTRACTION, BILATERAL     CERVICAL SPINE SURGERY     tumor removed   HERNIA REPAIR     KYPHOPLASTY N/A 03/12/2018   Procedure: [id number];  Surgeon: Amy Sharper, MD;  Location: [facility] ORS;  Service: Orthopedics;  Laterality: N/A;   KYPHOPLASTY N/A 06/30/2018   Procedure: PATTIE;  Surgeon: Amy Sharper, MD;  Location: [facility] ORS;  Service: Orthopedics;  Laterality: N/A;   plasmacytoma resection  2005   lumbar    Current Outpatient Medications:    ALLERGY RELIEF 10 MG tablet, Take 10 mg by mouth daily., Disp: , Rfl:    gabapentin  (NEURONTIN ) 100 MG capsule, Take 2 capsules (200 mg total) by mouth at bedtime., Disp: 60 capsule, Rfl: 3   Acetaminophen  500 MG capsule, Take 1,000 mg by mouth 3 (three) times daily., Disp: , Rfl:    aspirin  EC 81 MG tablet, Take 1 tablet by  mouth daily., Disp: , Rfl:    CALMOSEPTINE 0.44-20.6 % OINT, Apply 1 Application topically in the morning and at bedtime. With every shift apply moderate amount to sacral areas and groin, Disp: , Rfl:    COLD & HOT PLUS MENTHOL 4-1 % PTCH, Apply 1 patch topically daily., Disp: , Rfl:    diltiazem  (CARDIZEM  CD) 120 MG 24 hr capsule, Take 120 mg by mouth daily., Disp: , Rfl:    DULoxetine  (CYMBALTA ) 20 MG capsule, Take 20 mg by mouth at bedtime., Disp: , Rfl:    esomeprazole (NEXIUM) 40 MG capsule, Take 40 mg by mouth daily., Disp: , Rfl:    guaiFENesin -dextromethorphan (ROBITUSSIN DM) 100-10 MG/5ML syrup, Take 5 mLs by mouth every 4 (four) hours as needed for cough., Disp: , Rfl:    hydrOXYzine  (ATARAX ) 25 MG tablet, Take 1 tablet (25 mg total) by mouth 3 (three) times daily as needed for anxiety., Disp: , Rfl:    lidocaine  (LIDODERM ) 5 %, Place 1 patch onto the skin daily. Remove & Discard patch within 12 hours or as directed by MD, Disp: , Rfl:    melatonin 5 MG TABS, Take 0.5 tablets (2.5 mg total) by mouth at bedtime., Disp: , Rfl:  Multiple Vitamins-Minerals (PRESERVISION AREDS PO), Take by mouth., Disp: , Rfl:    ondansetron  (ZOFRAN ) 4 MG tablet, Take 4 mg by mouth every 8 (eight) hours as needed for nausea or vomiting., Disp: , Rfl:    polyethylene glycol (MIRALAX  / GLYCOLAX ) 17 g packet, Take 17 g by mouth daily., Disp: , Rfl:    QUEtiapine  (SEROQUEL ) 25 MG tablet, Take 25 mg by mouth daily., Disp: , Rfl:    QUEtiapine  (SEROQUEL ) 50 MG tablet, Take 50 mg by mouth at bedtime., Disp: , Rfl:    torsemide  (DEMADEX ) 10 MG tablet, Take 1 tablet (10 mg total) by mouth daily., Disp: , Rfl:   Allergies  Allergen Reactions   Doxycycline Hyclate Nausea Only   Levofloxacin  Nausea And Vomiting   Sulfa Antibiotics Rash    Other Reaction: Throat feels funny   Review of Systems Objective:  There were no vitals filed for this visit.  General: Well developed, nourished, in no acute distress, alert  and oriented x3   Dermatological: Skin is warm, dry and supple bilateral.  Nails are thick yellow dystrophic clinical mycotic painful on palpation and debridement remaining integument appears unremarkable at this time. There are no open sores, no preulcerative lesions, no rash or signs of infection present.  Vascular: Dorsalis Pedis artery and Posterior Tibial artery pedal pulses are 2/4 bilateral with immedate capillary fill time. Pedal hair growth present. No varicosities and no lower extremity edema present bilateral.   Neruologic: Grossly intact via light touch bilateral. Vibratory intact via tuning fork bilateral. Protective threshold with Semmes Wienstein monofilament diminished  to all pedal sites bilateral. Patellar and Achilles deep tendon reflexes 2+ bilateral. No Babinski or clonus noted bilateral.   Musculoskeletal: No gross boney pedal deformities bilateral. No pain, crepitus, or limitation noted with foot and ankle range of motion bilateral. Muscular strength 5/5 in all groups tested bilateral.  Gait: Wheelchair-bound   Radiographs:  None taken  Assessment & Plan:   Assessment: Nonambulatory patient with thick mycotic nails with painful neuropathy.  Plan: Increase her gabapentin  to 200 mg 1 p.o. nightly.  She will follow-up with Dr. May in about 2 months     Amy [name], [place]

## 2024-02-24 ENCOUNTER — Encounter (INDEPENDENT_AMBULATORY_CARE_PROVIDER_SITE_OTHER): Payer: Medicare Other | Admitting: Ophthalmology

## 2024-02-24 DIAGNOSIS — H353231 Exudative age-related macular degeneration, bilateral, with active choroidal neovascularization: Secondary | ICD-10-CM | POA: Diagnosis not present

## 2024-02-24 DIAGNOSIS — I10 Essential (primary) hypertension: Secondary | ICD-10-CM | POA: Diagnosis not present

## 2024-02-24 DIAGNOSIS — H35033 Hypertensive retinopathy, bilateral: Secondary | ICD-10-CM

## 2024-02-24 DIAGNOSIS — H43813 Vitreous degeneration, bilateral: Secondary | ICD-10-CM

## 2024-03-23 ENCOUNTER — Encounter (INDEPENDENT_AMBULATORY_CARE_PROVIDER_SITE_OTHER): Admitting: Ophthalmology

## 2024-03-30 ENCOUNTER — Encounter (INDEPENDENT_AMBULATORY_CARE_PROVIDER_SITE_OTHER): Admitting: Ophthalmology

## 2024-03-30 DIAGNOSIS — I10 Essential (primary) hypertension: Secondary | ICD-10-CM

## 2024-03-30 DIAGNOSIS — H43813 Vitreous degeneration, bilateral: Secondary | ICD-10-CM

## 2024-03-30 DIAGNOSIS — H35033 Hypertensive retinopathy, bilateral: Secondary | ICD-10-CM | POA: Diagnosis not present

## 2024-03-30 DIAGNOSIS — H353231 Exudative age-related macular degeneration, bilateral, with active choroidal neovascularization: Secondary | ICD-10-CM

## 2024-04-19 ENCOUNTER — Ambulatory Visit (INDEPENDENT_AMBULATORY_CARE_PROVIDER_SITE_OTHER): Payer: Medicare Other | Admitting: Podiatry

## 2024-04-19 DIAGNOSIS — Z91199 Patient's noncompliance with other medical treatment and regimen due to unspecified reason: Secondary | ICD-10-CM

## 2024-04-24 NOTE — Progress Notes (Signed)
 1. No-show for appointment   No show #1.

## 2024-04-27 ENCOUNTER — Other Ambulatory Visit: Payer: Self-pay

## 2024-04-27 ENCOUNTER — Inpatient Hospital Stay
Admission: EM | Admit: 2024-04-27 | Discharge: 2024-04-29 | DRG: 176 | Disposition: A | Discharge status: Home / Self Care | Attending: Internal Medicine | Admitting: Internal Medicine

## 2024-04-27 ENCOUNTER — Encounter (INDEPENDENT_AMBULATORY_CARE_PROVIDER_SITE_OTHER): Admitting: Ophthalmology

## 2024-04-27 ENCOUNTER — Emergency Department

## 2024-04-27 DIAGNOSIS — H353231 Exudative age-related macular degeneration, bilateral, with active choroidal neovascularization: Secondary | ICD-10-CM | POA: Diagnosis not present

## 2024-04-27 DIAGNOSIS — R112 Nausea with vomiting, unspecified: Secondary | ICD-10-CM

## 2024-04-27 DIAGNOSIS — F32A Depression, unspecified: Secondary | ICD-10-CM | POA: Insufficient documentation

## 2024-04-27 DIAGNOSIS — E876 Hypokalemia: Secondary | ICD-10-CM

## 2024-04-27 DIAGNOSIS — Z882 Allergy status to sulfonamides status: Secondary | ICD-10-CM | POA: Diagnosis not present

## 2024-04-27 DIAGNOSIS — I10 Essential (primary) hypertension: Secondary | ICD-10-CM

## 2024-04-27 DIAGNOSIS — K449 Diaphragmatic hernia without obstruction or gangrene: Secondary | ICD-10-CM | POA: Diagnosis present

## 2024-04-27 DIAGNOSIS — K2100 Gastro-esophageal reflux disease with esophagitis, without bleeding: Secondary | ICD-10-CM | POA: Diagnosis present

## 2024-04-27 DIAGNOSIS — Z79899 Other long term (current) drug therapy: Secondary | ICD-10-CM | POA: Diagnosis not present

## 2024-04-27 DIAGNOSIS — R935 Abnormal findings on diagnostic imaging of other abdominal regions, including retroperitoneum: Secondary | ICD-10-CM | POA: Diagnosis not present

## 2024-04-27 DIAGNOSIS — Z825 Family history of asthma and other chronic lower respiratory diseases: Secondary | ICD-10-CM

## 2024-04-27 DIAGNOSIS — H35033 Hypertensive retinopathy, bilateral: Secondary | ICD-10-CM

## 2024-04-27 DIAGNOSIS — G629 Polyneuropathy, unspecified: Secondary | ICD-10-CM

## 2024-04-27 DIAGNOSIS — I2510 Atherosclerotic heart disease of native coronary artery without angina pectoris: Secondary | ICD-10-CM | POA: Diagnosis present

## 2024-04-27 DIAGNOSIS — Z7901 Long term (current) use of anticoagulants: Secondary | ICD-10-CM

## 2024-04-27 DIAGNOSIS — M810 Age-related osteoporosis without current pathological fracture: Secondary | ICD-10-CM | POA: Diagnosis present

## 2024-04-27 DIAGNOSIS — J9611 Chronic respiratory failure with hypoxia: Secondary | ICD-10-CM | POA: Diagnosis present

## 2024-04-27 DIAGNOSIS — E785 Hyperlipidemia, unspecified: Secondary | ICD-10-CM | POA: Diagnosis present

## 2024-04-27 DIAGNOSIS — H43813 Vitreous degeneration, bilateral: Secondary | ICD-10-CM | POA: Diagnosis not present

## 2024-04-27 DIAGNOSIS — I110 Hypertensive heart disease with heart failure: Secondary | ICD-10-CM | POA: Diagnosis present

## 2024-04-27 DIAGNOSIS — Z7982 Long term (current) use of aspirin: Secondary | ICD-10-CM

## 2024-04-27 DIAGNOSIS — Z86718 Personal history of other venous thrombosis and embolism: Secondary | ICD-10-CM | POA: Diagnosis not present

## 2024-04-27 DIAGNOSIS — Z8249 Family history of ischemic heart disease and other diseases of the circulatory system: Secondary | ICD-10-CM

## 2024-04-27 DIAGNOSIS — I2699 Other pulmonary embolism without acute cor pulmonale: Secondary | ICD-10-CM | POA: Diagnosis present

## 2024-04-27 DIAGNOSIS — I340 Nonrheumatic mitral (valve) insufficiency: Secondary | ICD-10-CM | POA: Diagnosis present

## 2024-04-27 DIAGNOSIS — I2694 Multiple subsegmental pulmonary emboli without acute cor pulmonale: Principal | ICD-10-CM | POA: Diagnosis present

## 2024-04-27 DIAGNOSIS — Z881 Allergy status to other antibiotic agents status: Secondary | ICD-10-CM | POA: Diagnosis not present

## 2024-04-27 DIAGNOSIS — Z66 Do not resuscitate: Secondary | ICD-10-CM | POA: Diagnosis present

## 2024-04-27 DIAGNOSIS — I5032 Chronic diastolic (congestive) heart failure: Secondary | ICD-10-CM | POA: Diagnosis present

## 2024-04-27 LAB — CBC
HCT: 45.3 % (ref 36.0–46.0)
Hemoglobin: 14.4 g/dL (ref 12.0–15.0)
MCH: 27.5 pg (ref 26.0–34.0)
MCHC: 31.8 g/dL (ref 30.0–36.0)
MCV: 86.6 fL (ref 80.0–100.0)
Platelets: 303 10*3/uL (ref 150–400)
RBC: 5.23 MIL/uL — ABNORMAL HIGH (ref 3.87–5.11)
RDW: 15.9 % — ABNORMAL HIGH (ref 11.5–15.5)
WBC: 13.7 10*3/uL — ABNORMAL HIGH (ref 4.0–10.5)
nRBC: 0 % (ref 0.0–0.2)

## 2024-04-27 LAB — COMPREHENSIVE METABOLIC PANEL WITH GFR
ALT: 15 U/L (ref 0–44)
AST: 19 U/L (ref 15–41)
Albumin: 3.7 g/dL (ref 3.5–5.0)
Alkaline Phosphatase: 79 U/L (ref 38–126)
Anion gap: 14 (ref 5–15)
BUN: 38 mg/dL — ABNORMAL HIGH (ref 8–23)
CO2: 31 mmol/L (ref 22–32)
Calcium: 10.2 mg/dL (ref 8.9–10.3)
Chloride: 96 mmol/L — ABNORMAL LOW (ref 98–111)
Creatinine, Ser: 0.88 mg/dL (ref 0.44–1.00)
GFR, Estimated: >60 mL/min (ref >60)
Glucose, Bld: 145 mg/dL — ABNORMAL HIGH (ref 70–99)
Potassium: 3.3 mmol/L — ABNORMAL LOW (ref 3.5–5.1)
Sodium: 141 mmol/L (ref 135–145)
Total Bilirubin: 0.9 mg/dL (ref 0.0–1.2)
Total Protein: 6.3 g/dL — ABNORMAL LOW (ref 6.5–8.1)

## 2024-04-27 LAB — TROPONIN I (HIGH SENSITIVITY)
Troponin I (High Sensitivity): 13 ng/L (ref <18)
Troponin I (High Sensitivity): 13 ng/L (ref <18)

## 2024-04-27 LAB — TYPE AND SCREEN
ABO/RH(D): O POS
Antibody Screen: NEGATIVE

## 2024-04-27 LAB — PROTIME-INR
INR: 1 (ref 0.8–1.2)
Prothrombin Time: 13.8 s (ref 11.4–15.2)

## 2024-04-27 LAB — APTT: aPTT: 29 s (ref 24–36)

## 2024-04-27 MED ORDER — PANTOPRAZOLE SODIUM 40 MG PO TBEC: 40.0000 mg | DELAYED_RELEASE_TABLET | Freq: Every day | ORAL | Status: DC

## 2024-04-27 MED ORDER — ONDANSETRON HCL 4 MG PO TABS: 4.0000 mg | ORAL_TABLET | Freq: Four times a day (QID) | ORAL | Status: DC | PRN

## 2024-04-27 MED ORDER — TORSEMIDE 20 MG PO TABS
10.0000 mg | ORAL_TABLET | Freq: Every day | ORAL | Status: DC
  Administered 2024-04-28 – 2024-04-29 (×2): 10 mg via ORAL
  Filled 2024-04-27 (×2): qty 1

## 2024-04-27 MED ORDER — TRAZODONE HCL 50 MG PO TABS: 25.0000 mg | ORAL_TABLET | Freq: Every evening | ORAL | Status: DC | PRN

## 2024-04-27 MED ORDER — HYDROXYZINE HCL 25 MG PO TABS: 25.0000 mg | ORAL_TABLET | Freq: Three times a day (TID) | ORAL | Status: DC | PRN

## 2024-04-27 MED ORDER — SODIUM CHLORIDE 0.9 % IV BOLUS
500.0000 mL | Freq: Once | INTRAVENOUS | Status: AC
  Administered 2024-04-27: 500 mL via INTRAVENOUS

## 2024-04-27 MED ORDER — HEPARIN BOLUS VIA INFUSION
4000.0000 [IU] | Freq: Once | INTRAVENOUS | Status: AC
  Administered 2024-04-27: 4000 [IU] via INTRAVENOUS
  Filled 2024-04-27: qty 4000

## 2024-04-27 MED ORDER — ONDANSETRON HCL 4 MG/2ML IJ SOLN
4.0000 mg | Freq: Once | INTRAMUSCULAR | Status: AC
  Administered 2024-04-27: 4 mg via INTRAVENOUS
  Filled 2024-04-27: qty 2

## 2024-04-27 MED ORDER — LIDOCAINE 5 % EX PTCH
1.0000 | MEDICATED_PATCH | CUTANEOUS | Status: DC
  Administered 2024-04-28 – 2024-04-29 (×2): 1 via TRANSDERMAL
  Filled 2024-04-27 (×2): qty 1

## 2024-04-27 MED ORDER — DULOXETINE HCL 20 MG PO CPEP
20.0000 mg | ORAL_CAPSULE | Freq: Every day | ORAL | Status: DC
  Administered 2024-04-27 – 2024-04-28 (×2): 20 mg via ORAL
  Filled 2024-04-27 (×3): qty 1

## 2024-04-27 MED ORDER — ACETAMINOPHEN 650 MG RE SUPP: 650.0000 mg | Freq: Four times a day (QID) | RECTAL | Status: DC | PRN

## 2024-04-27 MED ORDER — IOHEXOL 300 MG/ML  SOLN
100.0000 mL | Freq: Once | INTRAMUSCULAR | Status: AC | PRN
  Administered 2024-04-27: 100 mL via INTRAVENOUS

## 2024-04-27 MED ORDER — MELATONIN 5 MG PO TABS
2.5000 mg | ORAL_TABLET | Freq: Every day | ORAL | Status: DC
  Administered 2024-04-27 – 2024-04-28 (×2): 2.5 mg via ORAL
  Filled 2024-04-27 (×2): qty 1

## 2024-04-27 MED ORDER — MAGNESIUM HYDROXIDE 400 MG/5ML PO SUSP
30.0000 mL | Freq: Every day | ORAL | Status: DC | PRN
  Administered 2024-04-29: 30 mL via ORAL
  Filled 2024-04-27: qty 30

## 2024-04-27 MED ORDER — ACETAMINOPHEN 325 MG PO TABS
650.0000 mg | ORAL_TABLET | Freq: Four times a day (QID) | ORAL | Status: DC | PRN
  Administered 2024-04-27: 650 mg via ORAL
  Filled 2024-04-27: qty 2

## 2024-04-27 MED ORDER — SODIUM CHLORIDE 0.9 % IV SOLN: INTRAVENOUS | Status: AC

## 2024-04-27 MED ORDER — GABAPENTIN 100 MG PO CAPS
200.0000 mg | ORAL_CAPSULE | Freq: Every day | ORAL | Status: DC
  Administered 2024-04-27 – 2024-04-28 (×2): 200 mg via ORAL
  Filled 2024-04-27 (×2): qty 2

## 2024-04-27 MED ORDER — DILTIAZEM HCL ER COATED BEADS 120 MG PO CP24
120.0000 mg | ORAL_CAPSULE | Freq: Every day | ORAL | Status: DC
  Administered 2024-04-27 – 2024-04-29 (×3): 120 mg via ORAL
  Filled 2024-04-27 (×3): qty 1

## 2024-04-27 MED ORDER — QUETIAPINE FUMARATE 25 MG PO TABS
50.0000 mg | ORAL_TABLET | Freq: Every day | ORAL | Status: DC
  Administered 2024-04-27 – 2024-04-28 (×2): 50 mg via ORAL
  Filled 2024-04-27 (×2): qty 2

## 2024-04-27 MED ORDER — HEPARIN (PORCINE) 25000 UT/250ML-% IV SOLN
1000.0000 [IU]/h | INTRAVENOUS | Status: DC
Start: 2024-04-27 — End: 2024-04-28
  Administered 2024-04-27: 1000 [IU]/h via INTRAVENOUS
  Filled 2024-04-27: qty 250

## 2024-04-27 MED ORDER — PANTOPRAZOLE SODIUM 40 MG IV SOLR
40.0000 mg | Freq: Two times a day (BID) | INTRAVENOUS | Status: DC
  Administered 2024-04-27 – 2024-04-29 (×4): 40 mg via INTRAVENOUS
  Filled 2024-04-27 (×5): qty 10

## 2024-04-27 MED ORDER — ONDANSETRON HCL 4 MG/2ML IJ SOLN: 4.0000 mg | Freq: Four times a day (QID) | INTRAMUSCULAR | Status: DC | PRN

## 2024-04-27 NOTE — Consult Note (Signed)
 PHARMACY - ANTICOAGULATION CONSULT NOTE  Pharmacy Consult for Heparin  Indication: pulmonary embolus  Allergies  Allergen Reactions   Doxycycline Hyclate Nausea Only   Levofloxacin  Nausea And Vomiting   Sulfa Antibiotics Rash    Other Reaction: Throat feels funny    Patient Measurements: Height: 5\' 5"  (165.1 cm) Weight: 65.5 kg (144 lb 6.4 oz) IBW/kg (Calculated) : 57 HEPARIN  DW (KG): 65.5  Vital Signs: BP: 157/83 (05/06 1800) Pulse Rate: 108 (05/06 1800)  Labs: Recent Labs    04/27/24 1624  HGB 14.4  HCT 45.3  PLT 303  CREATININE 0.88  TROPONINIHS 13    Estimated Creatinine Clearance: 36.7 mL/min (by C-G formula based on SCr of 0.88 mg/dL).   Medical History: Past Medical History:  Diagnosis Date   Arthritis    Depression    Diverticulitis    DVT (deep venous thrombosis) (HCC)    Right leg    Dyspnea    Family history of adverse reaction to anesthesia    PONV sister   GERD (gastroesophageal reflux disease)    History of hiatal hernia    Hyperlipidemia    Hypertension    Intractable vomiting with nausea 11/25/2015   Mitral valve regurgitation    Neuromuscular disorder (HCC)    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HCC)    Pneumonia    Renal disorder     Medications:  (Not in a hospital admission)  Scheduled:  Infusions:  PRN:  Anti-infectives (From admission, onward)    None       Assessment: 89 year old female presented with tachycardia and mild SOB. CT scan showed: Pulmonary emboli within the distal right main pulmonary artery extending into segmental branches of the right lower lobe. No DOAC PTA. Hgb and plt stable.   Goal of Therapy:  Heparin  level 0.3-0.7 units/ml Monitor platelets by anticoagulation protocol: Yes   Plan:  Give 4000 units bolus x 1 Start heparin  infusion at 1000 units/hr Check anti-Xa level in 8 hours and daily while on heparin  Continue to monitor H&H and platelets  Trinidad Funk, PharmD, BCPS 04/27/2024,7:53 PM

## 2024-04-27 NOTE — ED Notes (Signed)
 Patient transported to CT

## 2024-04-27 NOTE — ED Triage Notes (Signed)
 Pt presents to the ED via POV from home with family. Pt was at the [facility] dr and had shots in her eyes for degenerative eye disease. Pt had an episode of black colored emesis. Pt tachycardic at time of triage. Pt denies sx's. Reports mild SHOB.

## 2024-04-27 NOTE — ED Notes (Signed)
 Pt brief soiled w/ urine, pt cleaned and new brief placed. Pt changed into gown and belongings given to daughter at bedside. [place] placed at this time. Denies needs. Warm blankets provided. Call light within reach. Pt caregiver and daughter at bedside.

## 2024-04-27 NOTE — ED Provider Notes (Signed)
 [facility] Provider Note    Event Date/Time   First MD Initiated Contact with Patient 04/27/24 1724     (approximate)   History   Emesis   HPI  Amy [name] is a 89 year old female with history of CHF presenting to the emergency department for evaluation of vomiting.  Patient was seen by her eye doctor for generative eye disease earlier today.  She did have her eyes dilated with shots placed in her eyes which is routine for her.  While in the office she complained of nausea and in the car had 4 episodes of black-colored emesis.  Not described as coffee grounds.  No blood noted in stool.  No headache or abdominal pain.  Has been behaving at baseline prior to this.  Family does note a history of "twisting stomach" at which time she also had coffee-ground emesis.  I reviewed her surgery progress note from 08/08/2016.  At that time, patient presented with a 3-day history of coffee-ground emesis secondary to a gastric volvulus found on EGD for which surgical intervention was planned.     Physical Exam   Triage Vital Signs: ED Triage Vitals  Encounter Vitals Group     BP 04/27/24 1607 138/87     Systolic BP Percentile --      Diastolic BP Percentile --      Pulse Rate 04/27/24 1607 (!) 110     Resp 04/27/24 1607 18     Temp --      Temp src --      SpO2 04/27/24 1607 93 %     Weight 04/27/24 1608 144 lb 6.4 oz (65.5 kg)     Height 04/27/24 1608 5\' 5"  (1.651 m)     Head Circumference --      Peak Flow --      Pain Score 04/27/24 1608 0     Pain Loc --      Pain Education --      Exclude from Growth Chart --     Most recent vital signs: Vitals:   04/27/24 1725 04/27/24 1800  BP: (!) 135/96 (!) 157/83  Pulse: (!) 119 (!) 108  Resp: 19 17  SpO2: 92% 91%     General: Awake, interactive  CV:  Tachycardic with regular rhythm, normal peripheral perfusion  Resp:  Unlabored respirations, lungs good auscultation Abd:  Nondistended, soft, no  significant tenderness to palpation, white material on rectal exam, no obvious stool, Hemoccult negative Neuro:  Symmetric facial movement, fluid speech   ED Results / Procedures / Treatments   Labs (all labs ordered are listed, but only abnormal results are displayed) Labs Reviewed  COMPREHENSIVE METABOLIC PANEL WITH GFR - Abnormal; Notable for the following components:      Result Value   Potassium 3.3 (*)    Chloride 96 (*)    Glucose, Bld 145 (*)    BUN 38 (*)    Total Protein 6.3 (*)    All other components within normal limits  CBC - Abnormal; Notable for the following components:   WBC 13.7 (*)    RBC 5.23 (*)    RDW 15.9 (*)    All other components within normal limits  POC OCCULT BLOOD, ED  TYPE AND SCREEN  TROPONIN I (HIGH SENSITIVITY)  TROPONIN I (HIGH SENSITIVITY)     EKG EKG independently reviewed interpreted by myself (ER attending) demonstrates:  EKG demonstrate sinus tachycardia at a rate of 131, PR 140, QRS  94, QTc 469, no acute ST changes  RADIOLOGY Imaging independently reviewed and interpreted by myself demonstrates:  CT abdomen pelvis demonstrates hiatal hernia with fluid distention in the distal esophagus and wall thickening, also incidentally notes right sided pulmonary emboli  Formal Radiology Read:  CT ABDOMEN PELVIS W CONTRAST Addendum Date: 04/27/2024 ADDENDUM REPORT: 04/27/2024 19:20 ADDENDUM: These results were called by telephone at the time of interpretation on 04/27/2024 at 7:20 pm to provider Cydney Alvarenga , who verbally acknowledged these results. Electronically Signed   By: Tyron Gallon M.D.   On: 04/27/2024 19:20   Result Date: 04/27/2024 CLINICAL DATA:  Acute abdominal pain EXAM: CT ABDOMEN AND PELVIS WITH CONTRAST TECHNIQUE: Multidetector CT imaging of the abdomen and pelvis was performed using the standard protocol following bolus administration of intravenous contrast. RADIATION DOSE REDUCTION: This exam was performed according to the  departmental dose-optimization program which includes automated exposure control, adjustment of the mA and/or kV according to patient size and/or use of iterative reconstruction technique. CONTRAST:  OMNIPAQUE  IOHEXOL  300 MG/ML  SOLN COMPARISON:  CT PE protocol 10/03/2023. CT abdomen and pelvis 09/08/2023. FINDINGS: Lower chest: There is atelectasis in the lung bases with trace bilateral pleural effusions. Pulmonary emboli are seen within the distal right main pulmonary artery extending into segmental branches of the right lower lobe. Hepatobiliary: The gallbladder surgically absent. There is intra and extrahepatic biliary ductal dilatation with common bowel duct measuring up to 15 mm. This appears unchanged from prior. No focal liver lesions are identified. Pancreas: Unremarkable. No pancreatic ductal dilatation or surrounding inflammatory changes. Spleen: Normal in size without focal abnormality. Adrenals/Urinary Tract: Bilateral renal cysts are again noted measuring up to 2 cm. There is no hydronephrosis or perinephric stranding. The adrenal glands and bladder are within normal limits. Stomach/Bowel: There is a large hiatal hernia containing the proximal stomach. There is fluid distension of the distal esophagus and wall thickening at the gastroesophageal junction. The intra-abdominal portion of the stomach appears normal. The appendix is not seen. There is sigmoid colon diverticulosis. No focal inflammatory change or pneumatosis. No free air. Vascular/Lymphatic: Aortic atherosclerosis. No enlarged abdominal or pelvic lymph nodes. Reproductive: Status post hysterectomy. No adnexal masses. Other: No abdominal wall hernia or abnormality. No abdominopelvic ascites. Musculoskeletal: T11 and T12 vertebral plasty changes are present. The bones are diffusely osteopenic. Degenerative changes also affect the hips. IMPRESSION: 1. Pulmonary emboli within the distal right main pulmonary artery extending into segmental  branches of the right lower lobe. 2. Large hiatal hernia containing the proximal stomach. There is fluid distension of the distal esophagus and wall thickening at the gastroesophageal junction. Correlate clinically for esophagitis/gastritis. Underlying lesion not excluded. 3. Stable intra and extrahepatic biliary ductal dilatation status post cholecystectomy. 4. Sigmoid colon diverticulosis. 5. Aortic atherosclerosis. Aortic Atherosclerosis (ICD10-I70.0). Electronically Signed: By: Tyron Gallon M.D. On: 04/27/2024 19:15    PROCEDURES:  Critical Care performed: Yes, see critical care procedure note(s)  CRITICAL CARE Performed by: Claria Crofts   Total critical care time: 31 minutes  Critical care time was exclusive of separately billable procedures and treating other patients.  Critical care was necessary to treat or prevent imminent or life-threatening deterioration.  Critical care was time spent personally by me on the following activities: development of treatment plan with patient and/or surrogate as well as nursing, discussions with consultants, evaluation of patient's response to treatment, examination of patient, obtaining history from patient or surrogate, ordering and performing treatments and interventions, ordering and review of laboratory  studies, ordering and review of radiographic studies, pulse oximetry and re-evaluation of patient's condition.   Procedures   MEDICATIONS ORDERED IN ED: Medications  sodium chloride  0.9 % bolus 500 mL (500 mLs Intravenous New Bag/Given 04/27/24 1826)  ondansetron  (ZOFRAN ) injection 4 mg (4 mg Intravenous Given 04/27/24 1826)  iohexol  (OMNIPAQUE ) 300 MG/ML solution 100 mL (100 mLs Intravenous Contrast Given 04/27/24 1837)     IMPRESSION / MDM / ASSESSMENT AND PLAN / ED COURSE  I reviewed the triage vital signs and the nursing notes.  Differential diagnosis includes, but is not limited to, GI bleed, nausea related to eye dilation, recurrent gastric  volvulus or other acute intra-abdominal process  Patient's presentation is most consistent with acute presentation with potential threat to life or bodily function.  89 year old female presenting to the emergency department for evaluation of vomiting with black emesis though not coffee-ground in appearance.  Reassuring abdominal exam.  Borderline oxygen saturations.  Denying chest pain or shortness of breath on my evaluation.  Labs from triage without critical derangements, reassuring hemoglobin at 14.4.  Negative initial troponin.  CT abdomen pelvis ordered to further evaluate.  Patient ordered for IV fluids and Zofran  for symptomatic treatment.  Notified by radiologist that patient's CT demonstrated right-sided pulmonary emboli.  Negative troponin.  Was also notable for significant wall thickening and fluid distention of the GE junction and distal esophagus.  Reviewed history of gastric volvulus in the past with radiologist.  They note that patient does not have any visible twisting on her CT currently, but intermittent twisting could potentially cause some of the wall thickening seen on CT.  Discussed results of workup with patient.  She is agreeable with admission for further evaluation.  On reevaluation, I do note that she has been placed on 2 L oxygen, suspect she did have a desaturation that she is currently satting at 100% on this.  With her PE, suspect she will be appropriate with anticoagulation with heparin , but in the setting of her black emesis and abnormal GE junction findings on CT, will review with hospitalist team for initiating this.  7:56 PM Case reviewed with Dr. Dotty Gee.  He agrees that given overall clinical presentation, heparin  initiation is appropriate.  This was ordered.  He will evaluate patient for anticipated admission.        FINAL CLINICAL IMPRESSION(S) / ED DIAGNOSES   Final diagnoses:  Multiple subsegmental pulmonary emboli without acute cor pulmonale (HCC)  Nausea  and vomiting, unspecified vomiting type     Rx / DC Orders   ED Discharge Orders     None        Note:  This document was prepared using Dragon voice recognition software and may include unintentional dictation errors.   Claria Crofts, MD 04/27/24 [phone]

## 2024-04-27 NOTE — H&P (Incomplete)
 [place]   PATIENT NAME: Amy [name]    MR#:  [id number]  DATE OF BIRTH:  1929/03/14  DATE OF ADMISSION:  04/27/2024  PRIMARY CARE PHYSICIAN: Amy Cunning, [name]   Patient is coming from: Home  REQUESTING/REFERRING PHYSICIAN: Claria Crofts, [name]  CHIEF COMPLAINT:   Chief Complaint  Patient presents with   Emesis    HISTORY OF PRESENT ILLNESS:  Amy [name] is a 89 y.o. Caucasian female with medical history significant for osteoarthritis, depression, right leg DVT, GERD, hypertension, dyslipidemia and plasmacytoma, who presented to the emergency room with acute onset of black emesis X5 with no melena or bright red bleeding per rectum.  She denied any coffee-ground emesis or bloody vomitus.  She admits to fever and chills.  No diarrhea or abdominal pain.  No urinary frequency or urgency.  She had mild dysuria and had a recent UTI.  She denied any cough or dyspnea or hemoptysis or chest pain.  She has been having right leg swelling.  ED Course: When she came to the ER, BP was 138/87 with heart rate of 110 and otherwise normal vital signs..  Pulse oximetry was done to 91% on room air and 99% on 2 L of O2 via nasal cannula.  Labs revealed mild hypokalemia kalemia of 3.3 and hypochloremia of 96, blood glucose 145 BUN of 38 with a creatinine of 0.88 and total protein of 6.3 with albumin of 3.7.  High-sensitivity troponin I was 13 and later the same.  CBC showed leukocytosis 13.7.  Coag profile was normal.  Blood group was O+ with negative antibody screen. EKG as reviewed by me : Sinus tachycardia with rate 131 with left into hypertrophy with 3/abnormality, Q waves in V3 and V4 and T wave inversion with ST segment depression laterally. Imaging: Portable chest x-ray CT of the ab and pelvis with contrast revealed the following: 1. Pulmonary emboli within the distal right main pulmonary artery extending into segmental branches of the right lower lobe. 2. Large hiatal hernia containing the  proximal stomach. There is fluid distension of the distal esophagus and wall thickening at the gastroesophageal junction. Correlate clinically for esophagitis/gastritis. Underlying lesion not excluded. 3. Stable intra and extrahepatic biliary ductal dilatation status post cholecystectomy. 4. Sigmoid colon diverticulosis. 5. Aortic atherosclerosis.  The patient was given 500 mL IV normal saline bolus, 4 mg of IV Zofran  and IV heparin  bolus and drip.  She will be admitted to a progressive unit bed for further evaluation and management.  PAST MEDICAL HISTORY:   Past Medical History:  Diagnosis Date   Arthritis    Depression    Diverticulitis    DVT (deep venous thrombosis) (HCC)    Right leg    Dyspnea    Family history of adverse reaction to anesthesia    PONV sister   GERD (gastroesophageal reflux disease)    History of hiatal hernia    Hyperlipidemia    Hypertension    Intractable vomiting with nausea 11/25/2015   Mitral valve regurgitation    Neuromuscular disorder (HCC)    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HCC)    Pneumonia    Renal disorder     PAST SURGICAL HISTORY:   Past Surgical History:  Procedure Laterality Date   ABDOMINAL HYSTERECTOMY     partial   ABDOMINAL SURGERY     bladder tack  1990   BREAST BIOPSY  1974   CATARACT EXTRACTION, BILATERAL     CERVICAL SPINE SURGERY  tumor removed   HERNIA REPAIR     KYPHOPLASTY N/A 03/12/2018   Procedure: [id number];  Surgeon: Amy Angelucci, [name];  Location: [facility] ORS;  Service: Orthopedics;  Laterality: N/A;   KYPHOPLASTY N/A 06/30/2018   Procedure: Amy [name];  Surgeon: Amy Angelucci, [name];  Location: [facility] ORS;  Service: Orthopedics;  Laterality: N/A;   plasmacytoma resection  2005   lumbar    SOCIAL HISTORY:   Social History   Tobacco Use   Smoking status: Never   Smokeless tobacco: Never  Substance Use Topics   Alcohol use: No    Alcohol/week: 0.0 standard drinks of alcohol    FAMILY HISTORY:   Family History   Problem Relation Age of Onset   Heart failure Mother    Heart failure Father    COPD Sister    Prostate cancer Brother    COPD Brother     DRUG ALLERGIES:   Allergies  Allergen Reactions   Doxycycline Hyclate Nausea Only   Levofloxacin  Nausea And Vomiting   Sulfa Antibiotics Rash    Other Reaction: Throat feels funny    REVIEW OF SYSTEMS:   ROS As per history of present illness. All pertinent systems were reviewed above. Constitutional, HEENT, cardiovascular, respiratory, GI, GU, musculoskeletal, neuro, psychiatric, endocrine, integumentary and hematologic systems were reviewed and are otherwise negative/unremarkable except for positive findings mentioned above in the HPI.   MEDICATIONS AT HOME:   Prior to Admission medications   Medication Sig Start Date End Date Taking? Authorizing Provider  THERATEARS PF 0.25 % SOLN Apply 1 drop to eye 4 (four) times daily. 12/14/23  Yes Provider, Historical, [name]  Acetaminophen  500 MG capsule Take 1,000 mg by mouth 3 (three) times daily.    Provider, Historical, [name]  ALLERGY RELIEF 10 MG tablet Take 10 mg by mouth daily. 01/26/24   Provider, Historical, [name]  aspirin  EC 81 MG tablet Take 1 tablet by mouth daily.    Provider, Historical, [name]  CALMOSEPTINE 0.44-20.6 % OINT Apply 1 Application topically in the morning and at bedtime. With every shift apply moderate amount to sacral areas and groin 09/22/23   Provider, Historical, [name]  COLD & HOT PLUS MENTHOL 4-1 % PTCH Apply 1 patch topically daily. 09/13/23   Provider, Historical, [name]  diltiazem  (CARDIZEM  CD) 120 MG 24 hr capsule Take 120 mg by mouth daily. 08/18/23 08/17/24  Provider, Historical, [name]  DULoxetine  (CYMBALTA ) 20 MG capsule Take 20 mg by mouth at bedtime. 07/27/23   Provider, Historical, [name]  esomeprazole (NEXIUM) 40 MG capsule Take 40 mg by mouth daily. 03/03/23   Provider, Historical, [name]  gabapentin  (NEURONTIN ) 100 MG capsule Take 2 capsules (200 mg total) by mouth at bedtime. 01/28/24   Hyatt,  Max T, [name]  guaiFENesin -dextromethorphan (ROBITUSSIN DM) 100-10 MG/5ML syrup Take 5 mLs by mouth every 4 (four) hours as needed for cough. 09/12/23   Amy [name]  hydrOXYzine  (ATARAX ) 25 MG tablet Take 1 tablet (25 mg total) by mouth 3 (three) times daily as needed for anxiety. 09/12/23   Luna Salinas, [name]  lidocaine  (LIDODERM ) 5 % Place 1 patch onto the skin daily. Remove & Discard patch within 12 hours or as directed by [name] 09/22/23   Wouk, Haynes Lips, [name]  melatonin 5 MG TABS Take 0.5 tablets (2.5 mg total) by mouth at bedtime. 09/12/23   Amy [name]  Multiple Vitamins-Minerals (PRESERVISION AREDS PO) Take by mouth.    Provider, Historical, [name]  ondansetron  (ZOFRAN ) 4 MG tablet Take 4 mg by  mouth every 8 (eight) hours as needed for nausea or vomiting.    Provider, Historical, [name]  polyethylene glycol (MIRALAX  / GLYCOLAX ) 17 g packet Take 17 g by mouth daily. 09/23/23   Wouk, Haynes Lips, [name]  QUEtiapine  (SEROQUEL ) 25 MG tablet Take 25 mg by mouth daily.    Provider, Historical, [name]  QUEtiapine  (SEROQUEL ) 50 MG tablet Take 50 mg by mouth at bedtime. 07/27/23   Provider, Historical, [name]  torsemide  (DEMADEX ) 10 MG tablet Take 1 tablet (10 mg total) by mouth daily. 10/06/23   Ree Candy, [name]      VITAL SIGNS:  Blood pressure (!) 88/55, pulse 77, temperature 98 F (36.7 C), resp. rate 17, height 5\' 5"  (1.651 m), weight 65.5 kg, SpO2 92%.  PHYSICAL EXAMINATION:  Physical Exam  GENERAL:  89 y.o.-year-old Caucasian female patient lying in the bed with no acute distress.  EYES: Pupils equal, round, reactive to light and accommodation. No scleral icterus. Extraocular muscles intact.  HEENT: Head atraumatic, normocephalic. Oropharynx and nasopharynx clear.  NECK:  Supple, no jugular venous distention. No thyroid  enlargement, no tenderness.  LUNGS: Normal breath sounds bilaterally, no wheezing, rales,rhonchi or crepitation. No use of accessory muscles of respiration.  CARDIOVASCULAR:  Regular rate and rhythm, S1, S2 normal. No murmurs, rubs, or gallops.  ABDOMEN: Soft, nondistended, nontender. Bowel sounds present. No organomegaly or mass.  EXTREMITIES: No pedal edema, cyanosis, or clubbing.  NEUROLOGIC: Cranial nerves II through XII are intact. Muscle strength 5/5 in all extremities. Sensation intact. Gait not checked.  PSYCHIATRIC: The patient is alert and oriented x 3.  Normal affect and good eye contact. SKIN: No obvious rash, lesion, or ulcer.   LABORATORY PANEL:   CBC Recent Labs  Lab 04/27/24 1624  WBC 13.7*  HGB 14.4  HCT 45.3  PLT 303   ------------------------------------------------------------------------------------------------------------------  Chemistries  Recent Labs  Lab 04/27/24 1624  NA 141  K 3.3*  CL 96*  CO2 31  GLUCOSE 145*  BUN 38*  CREATININE 0.88  CALCIUM  10.2  AST 19  ALT 15  ALKPHOS 79  BILITOT 0.9   ------------------------------------------------------------------------------------------------------------------  Cardiac Enzymes No results for input(s): "TROPONINI" in the last 168 hours. ------------------------------------------------------------------------------------------------------------------  RADIOLOGY:  CT ABDOMEN PELVIS W CONTRAST Addendum Date: 04/27/2024 ADDENDUM REPORT: 04/27/2024 19:20 ADDENDUM: These results were called by telephone at the time of interpretation on 04/27/2024 at 7:20 pm to provider NEHA RAY , who verbally acknowledged these results. Electronically Signed   By: Tyron Gallon M.D.   On: 04/27/2024 19:20   Result Date: 04/27/2024 CLINICAL DATA:  Acute abdominal pain EXAM: CT ABDOMEN AND PELVIS WITH CONTRAST TECHNIQUE: Multidetector CT imaging of the abdomen and pelvis was performed using the standard protocol following bolus administration of intravenous contrast. RADIATION DOSE REDUCTION: This exam was performed according to the departmental dose-optimization program which includes automated  exposure control, adjustment of the mA and/or kV according to patient size and/or use of iterative reconstruction technique. CONTRAST:  OMNIPAQUE  IOHEXOL  300 MG/ML  SOLN COMPARISON:  CT PE protocol 10/03/2023. CT abdomen and pelvis 09/08/2023. FINDINGS: Lower chest: There is atelectasis in the lung bases with trace bilateral pleural effusions. Pulmonary emboli are seen within the distal right main pulmonary artery extending into segmental branches of the right lower lobe. Hepatobiliary: The gallbladder surgically absent. There is intra and extrahepatic biliary ductal dilatation with common bowel duct measuring up to 15 mm. This appears unchanged from prior. No focal liver lesions are identified. Pancreas: Unremarkable. No pancreatic ductal  dilatation or surrounding inflammatory changes. Spleen: Normal in size without focal abnormality. Adrenals/Urinary Tract: Bilateral renal cysts are again noted measuring up to 2 cm. There is no hydronephrosis or perinephric stranding. The adrenal glands and bladder are within normal limits. Stomach/Bowel: There is a large hiatal hernia containing the proximal stomach. There is fluid distension of the distal esophagus and wall thickening at the gastroesophageal junction. The intra-abdominal portion of the stomach appears normal. The appendix is not seen. There is sigmoid colon diverticulosis. No focal inflammatory change or pneumatosis. No free air. Vascular/Lymphatic: Aortic atherosclerosis. No enlarged abdominal or pelvic lymph nodes. Reproductive: Status post hysterectomy. No adnexal masses. Other: No abdominal wall hernia or abnormality. No abdominopelvic ascites. Musculoskeletal: T11 and T12 vertebral plasty changes are present. The bones are diffusely osteopenic. Degenerative changes also affect the hips. IMPRESSION: 1. Pulmonary emboli within the distal right main pulmonary artery extending into segmental branches of the right lower lobe. 2. Large hiatal hernia  containing the proximal stomach. There is fluid distension of the distal esophagus and wall thickening at the gastroesophageal junction. Correlate clinically for esophagitis/gastritis. Underlying lesion not excluded. 3. Stable intra and extrahepatic biliary ductal dilatation status post cholecystectomy. 4. Sigmoid colon diverticulosis. 5. Aortic atherosclerosis. Aortic Atherosclerosis (ICD10-I70.0). Electronically Signed: By: Tyron Gallon M.D. On: 04/27/2024 19:15      IMPRESSION AND PLAN:  Assessment and Plan: * Acute pulmonary embolism (HCC) - The patient will be admitted to a progressive unit bed. - Continue IV heparin . - Will obtain a 2D echo to assess for RV strain and bilateral lower extremity venous Doppler especially given suspected right lower extremity DVT. - O2 protocol will be followed. - This is likely secondary to recent immobility.  Abnormal CT of the abdomen - This showed large hiatal hernia and fluid distention of the distal esophagus with wall thickening in the GE junction concerning for possible esophagitis/gastritis without ruling out underlying mass/lesion. - GI consult to be obtained. - I notified Dr. Emerick Hanlon about the patient.  Hypokalemia - Potassium will be replaced and magnesium will be checked  Depression - We will continue Cymbalta  and Seroquel .  Essential hypertension - We will continue antihypertensive therapy.  Peripheral neuropathy - Will continue Neurontin .  GERD with esophagitis - Will continue the patient on PPI.    DVT prophylaxis: IV heparin . Advanced Care Planning:  Code Status: The patient is DNR and DNI. Family Communication:  The plan of care was discussed in details with the patient (and family). I answered all questions. The patient agreed to proceed with the above mentioned plan. Further management will depend upon hospital course. Disposition Plan: Back to previous home environment Consults called: GI consult All the records are  reviewed and case discussed with ED provider.  Status is: Inpatient   At the time of the admission, it appears that the appropriate admission status for this patient is inpatient.  This is judged to be reasonable and necessary in order to provide the required intensity of service to ensure the patient's safety given the presenting symptoms, physical exam findings and initial radiographic and laboratory data in the context of comorbid conditions.  The patient requires inpatient status due to high intensity of service, high risk of further deterioration and high frequency of surveillance required.  I certify that at the time of admission, it is my clinical judgment that the patient will require inpatient hospital care extending more than 2 midnights.  Dispo: The patient is from: Home              Anticipated d/c is to: Home              Patient currently is not medically stable to d/c.              Difficult to place patient: No  Virgene Griffin M.D on 04/28/2024 at 3:42 AM  [facility]   From 7 PM-7 AM, contact night-coverage [email or web address]  CC: Primary care physician; Amy Cunning, [name]

## 2024-04-27 NOTE — ED Notes (Signed)
 Blue top sent to lab.

## 2024-04-27 NOTE — ED Notes (Signed)
 Pt transported to room with caregivers and this RN. Pt reporting to ED d/t 1 occurrence of emesis that was black in color. All belongings brought over to room. Pt ABCs intact. RR even and unlabored. Pt in NAD. Pt connected to monitor.   Past Medical History:  Diagnosis Date   Arthritis    Depression    Diverticulitis    DVT (deep venous thrombosis) (HCC)    Right leg    Dyspnea    Family history of adverse reaction to anesthesia    PONV sister   GERD (gastroesophageal reflux disease)    History of hiatal hernia    Hyperlipidemia    Hypertension    Intractable vomiting with nausea 11/25/2015   Mitral valve regurgitation    Neuromuscular disorder (HCC)    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HCC)    Pneumonia    Renal disorder

## 2024-04-28 ENCOUNTER — Inpatient Hospital Stay
Admit: 2024-04-28 | Discharge: 2024-04-28 | Disposition: A | Discharge status: Home / Self Care | Attending: Family Medicine

## 2024-04-28 ENCOUNTER — Other Ambulatory Visit (HOSPITAL_COMMUNITY): Payer: Self-pay

## 2024-04-28 ENCOUNTER — Inpatient Hospital Stay

## 2024-04-28 ENCOUNTER — Telehealth (HOSPITAL_COMMUNITY): Payer: Self-pay | Admitting: Pharmacy Technician

## 2024-04-28 DIAGNOSIS — I10 Essential (primary) hypertension: Secondary | ICD-10-CM | POA: Insufficient documentation

## 2024-04-28 DIAGNOSIS — K2100 Gastro-esophageal reflux disease with esophagitis, without bleeding: Secondary | ICD-10-CM | POA: Insufficient documentation

## 2024-04-28 DIAGNOSIS — R935 Abnormal findings on diagnostic imaging of other abdominal regions, including retroperitoneum: Secondary | ICD-10-CM

## 2024-04-28 DIAGNOSIS — G629 Polyneuropathy, unspecified: Secondary | ICD-10-CM

## 2024-04-28 DIAGNOSIS — I2699 Other pulmonary embolism without acute cor pulmonale: Secondary | ICD-10-CM | POA: Diagnosis not present

## 2024-04-28 DIAGNOSIS — E876 Hypokalemia: Secondary | ICD-10-CM

## 2024-04-28 DIAGNOSIS — F32A Depression, unspecified: Secondary | ICD-10-CM | POA: Insufficient documentation

## 2024-04-28 LAB — BASIC METABOLIC PANEL WITH GFR
Anion gap: 11 (ref 5–15)
BUN: 36 mg/dL — ABNORMAL HIGH (ref 8–23)
CO2: 29 mmol/L (ref 22–32)
Calcium: 9.1 mg/dL (ref 8.9–10.3)
Chloride: 102 mmol/L (ref 98–111)
Creatinine, Ser: 0.85 mg/dL (ref 0.44–1.00)
GFR, Estimated: >60 mL/min (ref >60)
Glucose, Bld: 133 mg/dL — ABNORMAL HIGH (ref 70–99)
Potassium: 3.1 mmol/L — ABNORMAL LOW (ref 3.5–5.1)
Sodium: 142 mmol/L (ref 135–145)

## 2024-04-28 LAB — HEPARIN LEVEL (UNFRACTIONATED)
Heparin Unfractionated: >1.1 [IU]/mL — ABNORMAL HIGH (ref 0.30–0.70)
Heparin Unfractionated: >1.1 [IU]/mL — ABNORMAL HIGH (ref 0.30–0.70)

## 2024-04-28 LAB — CBC
HCT: 37.9 % (ref 36.0–46.0)
Hemoglobin: 12.2 g/dL (ref 12.0–15.0)
MCH: 28.4 pg (ref 26.0–34.0)
MCHC: 32.2 g/dL (ref 30.0–36.0)
MCV: 88.1 fL (ref 80.0–100.0)
Platelets: 210 10*3/uL (ref 150–400)
RBC: 4.3 MIL/uL (ref 3.87–5.11)
RDW: 16.2 % — ABNORMAL HIGH (ref 11.5–15.5)
WBC: 8.9 10*3/uL (ref 4.0–10.5)
nRBC: 0 % (ref 0.0–0.2)

## 2024-04-28 MED ORDER — POTASSIUM CHLORIDE CRYS ER 20 MEQ PO TBCR
40.0000 meq | EXTENDED_RELEASE_TABLET | ORAL | Status: AC
Start: 2024-04-28 — End: 2024-04-28
  Administered 2024-04-28 (×2): 40 meq via ORAL
  Filled 2024-04-28 (×2): qty 2

## 2024-04-28 MED ORDER — POLYVINYL ALCOHOL 1.4 % OP SOLN
1.0000 [drp] | Freq: Four times a day (QID) | OPHTHALMIC | Status: DC
  Administered 2024-04-28 – 2024-04-29 (×5): 1 [drp] via OPHTHALMIC
  Filled 2024-04-28: qty 15

## 2024-04-28 MED ORDER — HEPARIN (PORCINE) 25000 UT/250ML-% IV SOLN
750.0000 [IU]/h | INTRAVENOUS | Status: DC
  Administered 2024-04-28: 750 [IU]/h via INTRAVENOUS

## 2024-04-28 MED ORDER — HEPARIN (PORCINE) 25000 UT/250ML-% IV SOLN
550.0000 [IU]/h | INTRAVENOUS | Status: DC
  Administered 2024-04-28 – 2024-04-29 (×2): 550 [IU]/h via INTRAVENOUS
  Filled 2024-04-28: qty 250

## 2024-04-28 NOTE — Assessment & Plan Note (Signed)
Will continue Neurontin.

## 2024-04-28 NOTE — Assessment & Plan Note (Addendum)
-  Potassium will be replaced and magnesium will be checked.

## 2024-04-28 NOTE — Assessment & Plan Note (Signed)
-   This showed large hiatal hernia and fluid distention of the distal esophagus with wall thickening in the GE junction concerning for possible esophagitis/gastritis without ruling out underlying mass/lesion. - GI consult to be obtained. - I notified Dr. Emerick Hanlon about the patient.

## 2024-04-28 NOTE — Assessment & Plan Note (Addendum)
-   The patient will be admitted to a progressive unit bed. - Continue IV heparin . - Will obtain a 2D echo to assess for RV strain and bilateral lower extremity venous Doppler especially given suspected right lower extremity DVT. - O2 protocol will be followed. - This is likely secondary to recent immobility.

## 2024-04-28 NOTE — ED Notes (Addendum)
 Pt provided fresh blankets at this time

## 2024-04-28 NOTE — Assessment & Plan Note (Signed)
-   We will continue antihypertensive therapy.

## 2024-04-28 NOTE — Consult Note (Signed)
 PHARMACY - ANTICOAGULATION CONSULT NOTE  Pharmacy Consult for Heparin  Indication: pulmonary embolus  Allergies  Allergen Reactions   Doxycycline Hyclate Nausea Only   Levofloxacin  Nausea And Vomiting   Sulfa Antibiotics Rash    Other Reaction: Throat feels funny    Patient Measurements: Height: 5\' 5"  (165.1 cm) Weight: 65.5 kg (144 lb 6.4 oz) IBW/kg (Calculated) : 57 HEPARIN  DW (KG): 65.5  Vital Signs: Temp: 98 F (36.7 C) (05/07 1520) Temp Source: Oral (05/07 1520) BP: 125/64 (05/07 1430) Pulse Rate: 74 (05/07 1430)  Labs: Recent Labs    04/27/24 1624 04/27/24 1938 04/27/24 2005 04/28/24 0439 04/28/24 1459  HGB 14.4  --   --  12.2  --   HCT 45.3  --   --  37.9  --   PLT 303  --   --  210  --   APTT  --   --  29  --   --   LABPROT  --   --  13.8  --   --   INR  --   --  1.0  --   --   HEPARINUNFRC  --   --   --  >1.10* >1.10*  CREATININE 0.88  --   --  0.85  --   TROPONINIHS 13 13  --   --   --     Estimated Creatinine Clearance: 38 mL/min (by C-G formula based on SCr of 0.85 mg/dL).   Medical History: Past Medical History:  Diagnosis Date   Arthritis    Depression    Diverticulitis    DVT (deep venous thrombosis) (HCC)    Right leg    Dyspnea    Family history of adverse reaction to anesthesia    PONV sister   GERD (gastroesophageal reflux disease)    History of hiatal hernia    Hyperlipidemia    Hypertension    Intractable vomiting with nausea 11/25/2015   Mitral valve regurgitation    Neuromuscular disorder (HCC)    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HCC)    Pneumonia    Renal disorder    Medications:  (Not in a hospital admission)  Scheduled:  Infusions:  PRN:  Anti-infectives (From admission, onward)    None      Assessment: 89 year old female presented with tachycardia and mild SOB. CT scan showed: Pulmonary emboli within the distal right main pulmonary artery extending into segmental branches of the right lower lobe. No DOAC PTA. Hgb and plt stable.    Date Time HL Rate/Comment 05/07 0439 >1.10 Supratherapeutic on 1000 units/hr 05/07 1537 >1.10 Supratherapeutic on 750 units/hr  Goal of Therapy:  Heparin  level 0.3-0.7 units/ml Monitor platelets by anticoagulation protocol: Yes   Plan:  Heparin  level remains supratherapeutic this afternoon at >1.10 Per RN, lab drawn appropriately, minimal oozing from IV site but CBC stable and RN states oozing has not worsened since heparin  started Hold heparin  infusion for 1 hour, then resume at 550 units/hour Check heparin  level 8 hours after rate change Monitor daily heparin  level while on heparin  infusion Monitor CBC and signs/symptoms of bleeding Continue to monitor oozing from IV sites, notify MD if worsens  Thank you for involving pharmacy in this patient's care.   Ananias Balls, PharmD Clinical Pharmacist 04/28/2024 3:47 PM

## 2024-04-28 NOTE — Assessment & Plan Note (Signed)
-   We will continue Cymbalta and Seroquel. 

## 2024-04-28 NOTE — Consult Note (Signed)
 PHARMACY - ANTICOAGULATION CONSULT NOTE  Pharmacy Consult for Heparin  Indication: pulmonary embolus  Allergies  Allergen Reactions   Doxycycline Hyclate Nausea Only   Levofloxacin  Nausea And Vomiting   Sulfa Antibiotics Rash    Other Reaction: Throat feels funny    Patient Measurements: Height: 5\' 5"  (165.1 cm) Weight: 65.5 kg (144 lb 6.4 oz) IBW/kg (Calculated) : 57 HEPARIN  DW (KG): 65.5  Vital Signs: Temp: 98 F (36.7 C) (05/07 0217) Temp Source: Oral (05/06 2309) BP: 88/55 (05/07 0300) Pulse Rate: 77 (05/07 0300)  Labs: Recent Labs    04/27/24 1624 04/27/24 1938 04/27/24 2005 04/28/24 0439  HGB 14.4  --   --  12.2  HCT 45.3  --   --  37.9  PLT 303  --   --  210  APTT  --   --  29  --   LABPROT  --   --  13.8  --   INR  --   --  1.0  --   HEPARINUNFRC  --   --   --  >1.10*  CREATININE 0.88  --   --  0.85  TROPONINIHS 13 13  --   --     Estimated Creatinine Clearance: 38 mL/min (by C-G formula based on SCr of 0.85 mg/dL).   Medical History: Past Medical History:  Diagnosis Date   Arthritis    Depression    Diverticulitis    DVT (deep venous thrombosis) (HCC)    Right leg    Dyspnea    Family history of adverse reaction to anesthesia    PONV sister   GERD (gastroesophageal reflux disease)    History of hiatal hernia    Hyperlipidemia    Hypertension    Intractable vomiting with nausea 11/25/2015   Mitral valve regurgitation    Neuromuscular disorder (HCC)    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HCC)    Pneumonia    Renal disorder     Medications:  (Not in a hospital admission)  Scheduled:  Infusions:  PRN:  Anti-infectives (From admission, onward)    None       Assessment: 89 year old female presented with tachycardia and mild SOB. CT scan showed: Pulmonary emboli within the distal right main pulmonary artery extending into segmental branches of the right lower lobe. No DOAC PTA. Hgb and plt stable.   Goal of Therapy:  Heparin  level 0.3-0.7  units/ml Monitor platelets by anticoagulation protocol: Yes   Plan:  5/7:  HL @ 0439 = > 1.10, elevated - RN confirms sample was draw from opposite arm as heparin  infusion - Will hold heparin  drip for 1 hr and restart at 750 units/hr - Will draw HL 8 hrs after restart  - CBC daily   Armenta Erskin D, PharmD 04/28/2024,5:43 AM

## 2024-04-28 NOTE — ED Notes (Signed)
 Patient was found wet but no BM had occurred this shift. Bed linen, gown, and brief were changed. Patients IV dressings changed, IV site C,D,I and without bleeding at this time. Aaron Aas

## 2024-04-28 NOTE — Assessment & Plan Note (Signed)
-   Will continue the patient on PPI.

## 2024-04-28 NOTE — Telephone Encounter (Signed)
 Patient Product/process development scientist completed.    The patient is insured through [employer]. Patient has Medicare and is not eligible for a copay card, but may be able to apply for patient assistance or Medicare RX Payment Plan (Patient Must reach out to their plan, if eligible for payment plan), if available.    Ran test claim for Eliquis Starter Pack and the current 30 day co-pay is $577.10 due to a $590.00 deductilbe.  Ran test claim for Xarelto Starter Pack and the current 30 day co-pay is $632.59 due to a $590.00 deductilbe.  This test claim was processed through [facility]- copay amounts may vary at other pharmacies due to pharmacy/plan contracts, or as the patient moves through the different stages of their insurance plan.     Morgan Arab, CPHT Pharmacy Technician III Certified Patient Advocate [facility] Patient Advocate Team Direct Number: [phone]  Fax: [phone]

## 2024-04-28 NOTE — Consult Note (Signed)
 GI Inpatient Consult Note  Reason for Consult: UGIB   Attending Requesting Consult: Dr. Amalia Jung  History of Present Illness: Amy [name] is a 89 y.o. female seen for evaluation of UGIB at the request of admitting hospitalist - Dr. Amalia Jung. Patient has a PMH of HTN, HLD, ILD, OA, HFpEF, CAD, chronic respiratory failure with hypoxia, osteoporosis, and hx of plasmacytoma. She presented to the [facility] ED 5/6 yesterday afternoon after having multiple episodes of black colored emesis in the car after seeing her eye doctor. Upon presentation to the ED, she was tachycardic (110), O2 sats 93% on room air, and otherwise stable vital signs. Labs significant for hemoglobin 14.4, WBC 13.7K, potassium 3.3, BUN 38, serum creatinine 0.88, and INR 1.0. CT abd and pelvis commented on pulmonary emboli within the distal right main pulmonary artery extending into segmental branches of RLL, large hiatal hernia containing the proximal stomach with fluid distention of distal esophagus and wall thickening at GEJ, and no other acute findings. US  venous dopplers showed extensive DVT throughout right lower extremity veins, occlusive at level of common femoral vein. No evidence of DVT in LLE. She was started on IV heparin  and ordered to have 2D echo. GI consulted for concerns of possible UGIB.   Patient seen and examined resting comfortably in ED bed. 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 Her daughter, Adell Hones, participated on the telephone. There has been no further episodes of emesis since arrival. Patient denies any current abdominal pain, nausea, vomiting, hematochezia, or melena. She has history of gastric volvulus and paraesophageal hernia which were repaired during hospitalization at [facility] in 2017. She takes Nexium 40 mg daily at home for GERD. Daughter reports she is currently on hospice. She is planning to sign a form today to get her off hospice while in hospital.     Summary of GI Procedures:  EGD  07/26/2016 - non-bleeding esophagitis, tortuous esophagus, hematin in esophagus, hematin in entire stomach, 2.5L of fluid/old blood suctioned out, scope was unable to be passed through the stomach due to gastric volvulus  Past Medical History:  Past Medical History:  Diagnosis Date   Arthritis    Depression    Diverticulitis    DVT (deep venous thrombosis) (HCC)    Right leg    Dyspnea    Family history of adverse reaction to anesthesia    PONV sister   GERD (gastroesophageal reflux disease)    History of hiatal hernia    Hyperlipidemia    Hypertension    Intractable vomiting with nausea 11/25/2015   Mitral valve regurgitation    Neuromuscular disorder (HCC)    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HCC)    Pneumonia    Renal disorder     Problem List: Patient Active Problem List   Diagnosis Date Noted   Abnormal CT of the abdomen 04/28/2024   GERD with esophagitis 04/28/2024   Peripheral neuropathy 04/28/2024   Essential hypertension 04/28/2024   Depression 04/28/2024   Hypokalemia 04/28/2024   Acute pulmonary embolism (HCC) 04/27/2024   Community acquired pneumonia 10/05/2023   Acute on chronic congestive heart failure (HCC) 10/04/2023   Acute on chronic heart failure with preserved ejection fraction (HFpEF) (HCC) 10/03/2023   Dementia (HCC) 10/03/2023   Atelectasis, bilateral 09/14/2023   Leukocytosis 09/14/2023   History of vertebral compression fracture 09/14/2023   Weakness 09/14/2023   Shortness of breath 09/14/2023   Depression with anxiety 09/14/2023   Acute encephalopathy 09/12/2023   Hypoxia 09/12/2023   Stenosis of  left subclavian artery (HCC) 09/09/2023   Acute respiratory failure with hypoxia (HCC) 09/09/2023   Pneumonia 09/09/2023   Right flank pain 09/09/2023   History of recurrent UTI (urinary tract infection) 09/09/2023   Hyponatremia 09/09/2023   Hypochloremia 09/09/2023   Chronic cough 09/09/2023   Chronic tachycardia 09/09/2023   Frailty 09/09/2023   Acute metabolic  encephalopathy 09/09/2023   Right foot drop 08/18/2023   Lumbar radiculopathy 04/24/2021   Adult idiopathic generalized osteoporosis 01/01/2021   Goals of care, counseling/discussion 08/04/2020   Neck pain 08/04/2020   Abnormal MRI, neck 08/04/2020   History of plasmacytoma 08/04/2020   Orthostatic hypotension 09/13/2016   Dehydration 09/13/2016   Trismus 09/12/2016   Syncope 09/11/2016   Poor appetite 09/11/2016   Atherosclerosis of native coronary artery of native heart without angina pectoris 05/13/2016   Hiatal hernia without gangrene and obstruction 12/01/2015   Herpes zona 11/25/2015   Hx of deep vein thrombophlebitis of lower extremity 11/25/2015   Deep vein thrombosis (DVT) of lower extremity (HCC) 11/25/2015   Intractable vomiting with nausea 11/25/2015   Senile ecchymosis 11/14/2015   Mixed hyperlipidemia 11/14/2015   Acute renal failure superimposed on stage 3a chronic kidney disease (HCC) 11/09/2015   Arthritis, degenerative 11/09/2015   Cervical spinal cord compression (HCC) 11/09/2015   Osteoporosis, post-menopausal 11/09/2015   Female genuine stress incontinence 11/09/2015   Epigastric pain 10/03/2014   Hypertension 10/03/2014   Polyneuropathy 09/30/2014   CAD in native artery 06/08/2014   Benign essential HTN 06/08/2014   MI (mitral incompetence) 06/08/2014   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HCC) 05/28/2013    Past Surgical History: Past Surgical History:  Procedure Laterality Date   ABDOMINAL HYSTERECTOMY     partial   ABDOMINAL SURGERY     bladder tack  1990   BREAST BIOPSY  1974   CATARACT EXTRACTION, BILATERAL     CERVICAL SPINE SURGERY     tumor removed   HERNIA REPAIR     KYPHOPLASTY N/A 03/12/2018   Procedure: [id number];  Surgeon: Molli Angelucci, MD;  Location: [facility] ORS;  Service: Orthopedics;  Laterality: N/A;   KYPHOPLASTY N/A 06/30/2018   Procedure: Kathern Panda;  Surgeon: Molli Angelucci, MD;  Location: [facility] ORS;  Service: Orthopedics;  Laterality: N/A;    plasmacytoma resection  2005   lumbar    Allergies: Allergies  Allergen Reactions   Doxycycline Hyclate Nausea Only   Levofloxacin  Nausea And Vomiting   Sulfa Antibiotics Rash    Other Reaction: Throat feels funny    Home Medications: (Not in a hospital admission)  Home medication reconciliation was completed with the patient.   Scheduled Inpatient Medications:    diltiazem   120 mg Oral Daily   DULoxetine   20 mg Oral QHS   gabapentin   200 mg Oral QHS   lidocaine   1 patch Transdermal Q24H   melatonin  2.5 mg Oral QHS   pantoprazole  (PROTONIX ) IV  40 mg Intravenous Q12H   polyvinyl alcohol  1 drop Both Eyes QID   potassium chloride   40 mEq Oral Q4H   QUEtiapine   50 mg Oral QHS   torsemide   10 mg Oral Daily    Continuous Inpatient Infusions:    sodium chloride  100 mL/hr at 04/28/24 0731   heparin  750 Units/hr (04/28/24 0732)    PRN Inpatient Medications:  acetaminophen  **OR** acetaminophen , hydrOXYzine , magnesium hydroxide, ondansetron  **OR** ondansetron  (ZOFRAN ) IV, traZODone  Family History: family history includes COPD in her brother and sister; Heart failure in her father and mother; Prostate cancer in her  brother.  The patient's family history is negative for inflammatory bowel disorders, GI malignancy, or solid organ transplantation.  Social History:   reports that she has never smoked. She has never used smokeless tobacco. She reports that she does not drink alcohol and does not use drugs. The patient denies ETOH, tobacco, or drug use.   Review of Systems: Constitutional: + weight loss Eyes: No changes in vision. ENT: No oral lesions, sore throat.  GI: see HPI.  Heme/Lymph: No easy bruising.  CV: No chest pain.  GU: No hematuria.  Integumentary: No rashes.  Neuro: No headaches.  Psych: No depression/anxiety.  Endocrine: No heat/cold intolerance.  Allergic/Immunologic: No urticaria.  Resp: No cough, SOB.  Musculoskeletal: No joint swelling.    Physical  Examination: BP 118/63   Pulse 81   Temp 98.2 F (36.8 C) (Oral)   Resp 16   Ht 5\' 5"  (1.651 m)   Wt 65.5 kg   SpO2 99%   BMI 24.03 kg/m  Gen: NAD, alert and oriented x 4 HEENT: PEERLA, EOMI, Neck: supple, no JVD or thyromegaly Chest: CTA bilaterally, no wheezes, crackles, or other adventitious sounds CV: RRR, no m/g/c/r Abd: soft, NT, ND, +BS in all four quadrants; no HSM, guarding, ridigity, or rebound tenderness Ext: no edema, well perfused with 2+ pulses, Skin: no rash or lesions noted Lymph: no LAD  Data: Lab Results  Component Value Date   WBC 8.9 04/28/2024   HGB 12.2 04/28/2024   HCT 37.9 04/28/2024   MCV 88.1 04/28/2024   PLT 210 04/28/2024   Recent Labs  Lab 04/27/24 1624 04/28/24 0439  HGB 14.4 12.2   Lab Results  Component Value Date   NA 142 04/28/2024   K 3.1 (L) 04/28/2024   CL 102 04/28/2024   CO2 29 04/28/2024   BUN 36 (H) 04/28/2024   CREATININE 0.85 04/28/2024   Lab Results  Component Value Date   ALT 15 04/27/2024   AST 19 04/27/2024   ALKPHOS 79 04/27/2024   BILITOT 0.9 04/27/2024   Recent Labs  Lab 04/27/24 2005  APTT 29  INR 1.0   CT abd/pelvis with contrast 04/27/2024: IMPRESSION: 1. Pulmonary emboli within the distal right main pulmonary artery extending into segmental branches of the right lower lobe. 2. Large hiatal hernia containing the proximal stomach. There is fluid distension of the distal esophagus and wall thickening at the gastroesophageal junction. Correlate clinically for esophagitis/gastritis. Underlying lesion not excluded. 3. Stable intra and extrahepatic biliary ductal dilatation status post cholecystectomy. 4. Sigmoid colon diverticulosis. 5. Aortic atherosclerosis.   Aortic Atherosclerosis (ICD10-I70.0).  Assessment/Plan:  89 y/o Caucasian female with a PMH of HTN, HLD, ILD, OA, HFpEF, CAD, chronic respiratory failure with hypoxia, osteoporosis, and hx of plasmacytoma presented to the [facility] ED 5/6 for  chief complaint of coffee-ground emesis concerning for UGIB. She was found to have DVT/PE on imaging and is on IV heparin . GI consulted for further evaluation and management.   Coffee-ground emesis - possible UGIB. H&H currently stable without evidence of overt GI bleeding without significant drop in hemoglobin. Differential includes esophagitis, gastritis, AVMs, neoplasm, polyp, Cameron erosions, etc. CT scan showed large hiatal hernia without evidence of volvulus/obstruction.   DVT/PE - on IV heparin    Large hiatal hernia  Hx of paraesophageal hiatal hernia and gastric volvulus s/p repair 2017  Hypokalemia  Advanced age (69)  DNR - limited   Recommendations:  - H&H stable currently with no evidence of significant anemia or overt GIB -  Continue supportive care per primary team with electrolyte replacement PRN - Continue to monitor for signs of GIB - Continue to monitor serial H&H. Transfuse for Hgb <8.0.  - Continue PPI for gastric protection - Strict antireflux precautions advised  - Defer management of DVT/PE to primary team.  - OK to continue IV heparin  from GI standpoint with close monitoring  - Reviewed potential EGD as next step for diagnostic work-up of coffee-ground emesis and CT scan commenting on esophagitis/gastritis. Patient's daughter, Adell Hones, DOES NOT wish to proceed with invasive procedures at this time.  - Defer EGD at this time.  - ADAT - GI will sign off and follower peripherally  Thank you for the consult. Please call with questions or concerns.  Landa Pine, PA-C [facility] [phone]

## 2024-04-28 NOTE — Progress Notes (Signed)
 Progress Note   Patient: Amy [name] OZH:[id number] DOB: 08/25/1929 DOA: 04/27/2024     1 DOS: the patient was seen and examined on 04/28/2024   Brief hospital course: From HPI "Amy [name] is a 89 y.o. Caucasian female with medical history significant for osteoarthritis, depression, right leg DVT, GERD, hypertension, dyslipidemia and plasmacytoma, who presented to the emergency room with acute onset of black emesis X5 with no melena or bright red bleeding per rectum.  She denied any coffee-ground emesis or bloody vomitus.  She admits to fever and chills.  No diarrhea or abdominal pain.  No urinary frequency or urgency.  She had mild dysuria and had a recent UTI.  She denied any cough or dyspnea or hemoptysis or chest pain.  She has been having right leg swelling.   ED Course: When she came to the ER, BP was 138/87 with heart rate of 110 and otherwise normal vital signs..  Pulse oximetry was done to 91% on room air and 99% on 2 L of O2 via nasal cannula.  Labs revealed mild hypokalemia kalemia of 3.3 and hypochloremia of 96, blood glucose 145 BUN of 38 with a creatinine of 0.88 and total protein of 6.3 with albumin of 3.7.  High-sensitivity troponin I was 13 and later the same.  CBC showed leukocytosis 13.7.  Coag profile was normal.  Blood group was O+ with negative antibody screen. EKG as reviewed by me : Sinus tachycardia with rate 131 with left into hypertrophy with 3/abnormality, Q waves in V3 and V4 and T wave inversion with ST segment depression laterally. Imaging: Portable chest x-ray CT of the ab and pelvis with contrast revealed the following: 1. Pulmonary emboli within the distal right main pulmonary artery extending into segmental branches of the right lower lobe. 2. Large hiatal hernia containing the proximal stomach.  "  Assessment and Plan: * Acute pulmonary embolism (HCC) CT angio showing right-sided pulmonary embolism Continue heparin  drip Follow-up on  echocardiogram Follow-up on Doppler ultrasound to rule out DVT   Abnormal CT of the abdomen - This showed large hiatal hernia and fluid distention of the distal esophagus with wall thickening in the GE junction concerning for possible esophagitis/gastritis without ruling out underlying mass/lesion. Gastroenterologist have seen patient and case discussed At this point patient and family not willing to undergo any endoscopic intervention   Hypokalemia Continue repletion and monitoring   Depression - We will continue Cymbalta  and Seroquel .   Essential hypertension - We will continue antihypertensive therapy.   Peripheral neuropathy - Will continue Neurontin .   GERD with esophagitis - Will continue the patient on PPI.     DVT prophylaxis: IV heparin . Advanced Care Planning:    Code Status: The patient is DNR and DNI. Family Communication: Discussed with patient's family   Disposition Plan: Back to previous home environment Consults called: GI consult  Subjective:  Patient seen and examined at bedside this morning Admits to improvement in respiratory function  Physical Exam:  GENERAL: Elderly female laying in bed in no acute distress HEENT: Head atraumatic, normocephalic. Oropharynx and nasopharynx clear.  NECK:  Supple, no jugular venous distention. No thyroid  enlargement, no tenderness.  LUNGS: Normal breath sounds bilaterally, no wheezing, rales,rhonchi or crepitation. No use of accessory muscles of respiration.  CARDIOVASCULAR: Regular rate and rhythm, S1, S2 normal. No murmurs, rubs, or gallops.  ABDOMEN: Soft, nondistended, nontender. Bowel sounds present. No organomegaly or mass.  EXTREMITIES: No pedal edema, cyanosis, or clubbing.  NEUROLOGIC: Cranial nerves  II through XII are intact. Muscle strength 5/5 in all extremities. Sensation intact. Gait not checked.  PSYCHIATRIC: The patient is alert and oriented x 3.  Normal affect and good eye contact.  Vitals:    04/28/24 1400 04/28/24 1430 04/28/24 1520 04/28/24 1630  BP: 137/68 125/64  117/68  Pulse: 73 74  87  Resp: 16 14  20   Temp:   98 F (36.7 C)   TempSrc:   Oral   SpO2: 100% 100%  100%  Weight:      Height:        Data Reviewed:    Latest Ref Rng & Units 04/28/2024    4:39 AM 04/27/2024    4:24 PM 10/06/2023    4:36 AM  BMP  Glucose 70 - 99 mg/dL 161  096  045   BUN 8 - 23 mg/dL 36  38  11   Creatinine 0.44 - 1.00 mg/dL 4.09  8.11  9.14   Sodium 135 - 145 mmol/L 142  141  141   Potassium 3.5 - 5.1 mmol/L 3.1  3.3  3.7   Chloride 98 - 111 mmol/L 102  96  95   CO2 22 - 32 mmol/L 29  31  32   Calcium  8.9 - 10.3 mg/dL 9.1  78.2  8.8        Latest Ref Rng & Units 04/28/2024    4:39 AM 04/27/2024    4:24 PM 10/05/2023    4:57 AM  CBC  WBC 4.0 - 10.5 K/uL 8.9  13.7  6.5   Hemoglobin 12.0 - 15.0 g/dL 95.6  21.3  08.6   Hematocrit 36.0 - 46.0 % 37.9  45.3  35.3   Platelets 150 - 400 K/uL 210  303  186      Author: Ezzard Holms, MD 04/28/2024 6:07 PM  For on call review [email or web address].

## 2024-04-29 ENCOUNTER — Other Ambulatory Visit: Payer: Self-pay

## 2024-04-29 DIAGNOSIS — K449 Diaphragmatic hernia without obstruction or gangrene: Secondary | ICD-10-CM | POA: Diagnosis not present

## 2024-04-29 DIAGNOSIS — Z882 Allergy status to sulfonamides status: Secondary | ICD-10-CM | POA: Diagnosis not present

## 2024-04-29 DIAGNOSIS — I2699 Other pulmonary embolism without acute cor pulmonale: Secondary | ICD-10-CM | POA: Diagnosis not present

## 2024-04-29 DIAGNOSIS — Z86718 Personal history of other venous thrombosis and embolism: Secondary | ICD-10-CM | POA: Diagnosis not present

## 2024-04-29 DIAGNOSIS — I110 Hypertensive heart disease with heart failure: Secondary | ICD-10-CM | POA: Diagnosis not present

## 2024-04-29 DIAGNOSIS — E876 Hypokalemia: Secondary | ICD-10-CM | POA: Diagnosis not present

## 2024-04-29 DIAGNOSIS — Z881 Allergy status to other antibiotic agents status: Secondary | ICD-10-CM | POA: Diagnosis not present

## 2024-04-29 DIAGNOSIS — K2100 Gastro-esophageal reflux disease with esophagitis, without bleeding: Secondary | ICD-10-CM | POA: Diagnosis not present

## 2024-04-29 DIAGNOSIS — I2694 Multiple subsegmental pulmonary emboli without acute cor pulmonale: Secondary | ICD-10-CM | POA: Diagnosis not present

## 2024-04-29 DIAGNOSIS — Z79899 Other long term (current) drug therapy: Secondary | ICD-10-CM | POA: Diagnosis not present

## 2024-04-29 DIAGNOSIS — Z66 Do not resuscitate: Secondary | ICD-10-CM | POA: Diagnosis not present

## 2024-04-29 DIAGNOSIS — Z825 Family history of asthma and other chronic lower respiratory diseases: Secondary | ICD-10-CM | POA: Diagnosis not present

## 2024-04-29 DIAGNOSIS — I340 Nonrheumatic mitral (valve) insufficiency: Secondary | ICD-10-CM | POA: Diagnosis not present

## 2024-04-29 DIAGNOSIS — Z7901 Long term (current) use of anticoagulants: Secondary | ICD-10-CM | POA: Diagnosis not present

## 2024-04-29 DIAGNOSIS — Z7982 Long term (current) use of aspirin: Secondary | ICD-10-CM | POA: Diagnosis not present

## 2024-04-29 DIAGNOSIS — F32A Depression, unspecified: Secondary | ICD-10-CM | POA: Diagnosis not present

## 2024-04-29 DIAGNOSIS — G629 Polyneuropathy, unspecified: Secondary | ICD-10-CM | POA: Diagnosis not present

## 2024-04-29 DIAGNOSIS — I5032 Chronic diastolic (congestive) heart failure: Secondary | ICD-10-CM | POA: Diagnosis not present

## 2024-04-29 DIAGNOSIS — M810 Age-related osteoporosis without current pathological fracture: Secondary | ICD-10-CM | POA: Diagnosis not present

## 2024-04-29 DIAGNOSIS — E785 Hyperlipidemia, unspecified: Secondary | ICD-10-CM | POA: Diagnosis not present

## 2024-04-29 DIAGNOSIS — Z8249 Family history of ischemic heart disease and other diseases of the circulatory system: Secondary | ICD-10-CM | POA: Diagnosis not present

## 2024-04-29 DIAGNOSIS — J9611 Chronic respiratory failure with hypoxia: Secondary | ICD-10-CM | POA: Diagnosis not present

## 2024-04-29 DIAGNOSIS — I2510 Atherosclerotic heart disease of native coronary artery without angina pectoris: Secondary | ICD-10-CM | POA: Diagnosis not present

## 2024-04-29 LAB — BASIC METABOLIC PANEL WITH GFR
Anion gap: 10 (ref 5–15)
BUN: 30 mg/dL — ABNORMAL HIGH (ref 8–23)
CO2: 27 mmol/L (ref 22–32)
Calcium: 9 mg/dL (ref 8.9–10.3)
Chloride: 105 mmol/L (ref 98–111)
Creatinine, Ser: 0.86 mg/dL (ref 0.44–1.00)
GFR, Estimated: >60 mL/min (ref >60)
Glucose, Bld: 122 mg/dL — ABNORMAL HIGH (ref 70–99)
Potassium: 5 mmol/L (ref 3.5–5.1)
Sodium: 142 mmol/L (ref 135–145)

## 2024-04-29 LAB — CBC WITH DIFFERENTIAL/PLATELET
Abs Immature Granulocytes: 0.18 10*3/uL — ABNORMAL HIGH (ref 0.00–0.07)
Basophils Absolute: 0.1 10*3/uL (ref 0.0–0.1)
Basophils Relative: 1 %
Eosinophils Absolute: 0.1 10*3/uL (ref 0.0–0.5)
Eosinophils Relative: 1 %
HCT: 38 % (ref 36.0–46.0)
Hemoglobin: 11.5 g/dL — ABNORMAL LOW (ref 12.0–15.0)
Immature Granulocytes: 2 %
Lymphocytes Relative: 19 %
Lymphs Abs: 1.6 10*3/uL (ref 0.7–4.0)
MCH: 27.4 pg (ref 26.0–34.0)
MCHC: 30.3 g/dL (ref 30.0–36.0)
MCV: 90.5 fL (ref 80.0–100.0)
Monocytes Absolute: 0.5 10*3/uL (ref 0.1–1.0)
Monocytes Relative: 6 %
Neutro Abs: 5.9 10*3/uL (ref 1.7–7.7)
Neutrophils Relative %: 71 %
Platelets: 197 10*3/uL (ref 150–400)
RBC: 4.2 MIL/uL (ref 3.87–5.11)
RDW: 16.4 % — ABNORMAL HIGH (ref 11.5–15.5)
WBC: 8.3 10*3/uL (ref 4.0–10.5)
nRBC: 0 % (ref 0.0–0.2)

## 2024-04-29 LAB — ECHOCARDIOGRAM COMPLETE
Area-P 1/2: 3.88 cm2
Height: 65 in
S' Lateral: 2.2 cm
Weight: 2310.42 [oz_av]

## 2024-04-29 LAB — HEPARIN LEVEL (UNFRACTIONATED): Heparin Unfractionated: 0.56 [IU]/mL (ref 0.30–0.70)

## 2024-04-29 MED ORDER — APIXABAN 5 MG PO TABS
5.0000 mg | ORAL_TABLET | Freq: Two times a day (BID) | ORAL | 4 refills | Status: DC
  Filled 2024-04-29: qty 60, 30d supply, fill #0

## 2024-04-29 MED ORDER — APIXABAN 5 MG PO TABS
5.0000 mg | ORAL_TABLET | Freq: Two times a day (BID) | ORAL | 0 refills | Status: DC
  Filled 2024-04-29: qty 14, 7d supply, fill #0

## 2024-04-29 MED ORDER — APIXABAN (ELIQUIS) VTE STARTER PACK (10MG AND 5MG)
ORAL_TABLET | ORAL | 0 refills | Status: DC
  Filled 2024-04-29 (×2): qty 74, 30d supply, fill #0

## 2024-04-29 MED ORDER — APIXABAN 5 MG PO TABS
10.0000 mg | ORAL_TABLET | Freq: Once | ORAL | Status: AC
  Administered 2024-04-29: 10 mg via ORAL
  Filled 2024-04-29: qty 2

## 2024-04-29 NOTE — Discharge Summary (Signed)
 Physician Discharge Summary   Patient: Amy [name] MRN: [id number] DOB: 1929/02/12  Admit date:     04/27/2024  Discharge date: 04/29/24  Discharge Physician: Ezzard Holms   PCP: Sari Cunning, MD   Recommendations at discharge:  Follow-up on PCP  Discharge Diagnoses: Acute pulmonary embolism (HCC) CT angio showing right-sided pulmonary embolism Abnormal CT of the abdomen Hypokalemia Depression Essential hypertension Peripheral neuropathy GERD with esophagitis   Hospital Course: Amy [name] is a 89 y.o. Caucasian female with medical history significant for osteoarthritis, depression, right leg DVT, GERD, hypertension, dyslipidemia and plasmacytoma, who presented to the emergency room with acute onset of black emesis X5 with no melena or bright red bleeding per rectum.  In the emergency room CT scan was obtained that showed findings of wall thickening at the GE junction concerning for possible esophagitis/gastritis to rule out any underlying mass.  Gastroenterologist saw patient and discussed with family for possible endoscopic intervention however family declined given patient's age and her wishes.  Lung imaging also found pulmonary embolism in the right main pulmonary vessel distally.  Lower extremity Doppler ultrasound also showed extensive DVT throughout the right lower extremity veins however negative on the left.  This was also discussed with patient's family and they agreed for only anticoagulation without perceiving any additional vascular intervention if needed. They have agreed that since patient has 24-hour care at home they have agreed for patient to be discharged home rather than facility.  Consultants: Gastroenterology Procedures performed: None Disposition: Home Diet recommendation:  Cardiac diet DISCHARGE MEDICATION: Allergies as of 04/29/2024       Reactions   Doxycycline Hyclate Nausea Only   Levofloxacin  Nausea And Vomiting   Sulfa Antibiotics Rash    Other Reaction: Throat feels funny        Medication List     STOP taking these medications    aspirin  EC 81 MG tablet       TAKE these medications    Acetaminophen  500 MG capsule Take 1,000 mg by mouth 3 (three) times daily.   Allergy Relief 10 MG tablet Generic drug: loratadine Take 10 mg by mouth daily.   Calmoseptine 0.44-20.6 % Oint Generic drug: Menthol-Zinc  Oxide Apply 1 Application topically in the morning and at bedtime. With every shift apply moderate amount to sacral areas and groin   Cold & Hot Plus Menthol 4-1 % Ptch Generic drug: Lidocaine -Menthol Apply 1 patch topically daily.   diltiazem  120 MG 24 hr capsule Commonly known as: CARDIZEM  CD Take 120 mg by mouth daily.   DULoxetine  20 MG capsule Commonly known as: CYMBALTA  Take 20 mg by mouth at bedtime.   Eliquis DVT/PE Starter Pack Generic drug: Apixaban Starter Pack (10mg  and 5mg ) Take as directed on package: start with two-5mg  tablets twice daily for 7 days. On day 8, switch to one-5mg  tablet twice daily.   apixaban 5 MG Tabs tablet Commonly known as: ELIQUIS Take 1 tablet (5 mg total) by mouth 2 (two) times daily for 7 days.   apixaban 5 MG Tabs tablet Commonly known as: ELIQUIS Take 1 tablet (5 mg total) by mouth 2 (two) times daily. Start taking on: May 07, 2024   esomeprazole 40 MG capsule Commonly known as: NEXIUM Take 40 mg by mouth daily.   gabapentin  100 MG capsule Commonly known as: NEURONTIN  Take 2 capsules (200 mg total) by mouth at bedtime.   guaiFENesin -dextromethorphan 100-10 MG/5ML syrup Commonly known as: ROBITUSSIN DM Take 5 mLs by mouth every  4 (four) hours as needed for cough.   hydrOXYzine  25 MG tablet Commonly known as: ATARAX  Take 1 tablet (25 mg total) by mouth 3 (three) times daily as needed for anxiety.   lidocaine  5 % Commonly known as: LIDODERM  Place 1 patch onto the skin daily. Remove & Discard patch within 12 hours or as directed by MD   melatonin 5  MG Tabs Take 0.5 tablets (2.5 mg total) by mouth at bedtime.   ondansetron  4 MG tablet Commonly known as: ZOFRAN  Take 4 mg by mouth every 8 (eight) hours as needed for nausea or vomiting.   polyethylene glycol 17 g packet Commonly known as: MIRALAX  / GLYCOLAX  Take 17 g by mouth daily.   PRESERVISION AREDS PO Take by mouth.   QUEtiapine  50 MG tablet Commonly known as: SEROQUEL  Take 50 mg by mouth at bedtime. What changed: Another medication with the same name was removed. Continue taking this medication, and follow the directions you see here.   Theratears PF 0.25 % Soln Generic drug: Carboxymethylcellulose Sod PF Apply 1 drop to eye 4 (four) times daily.   torsemide  10 MG tablet Commonly known as: DEMADEX  Take 1 tablet (10 mg total) by mouth daily.        Discharge Exam: Filed Weights   04/27/24 1608  Weight: 65.5 kg   GENERAL: Elderly female laying in bed in no acute distress HEENT: Head atraumatic, normocephalic. Oropharynx and nasopharynx clear.  NECK:  Supple, no jugular venous distention. No thyroid  enlargement, no tenderness.  LUNGS: Normal breath sounds bilaterally, no wheezing, rales,rhonchi or crepitation. No use of accessory muscles of respiration.  CARDIOVASCULAR: Regular rate and rhythm, S1, S2 normal. No murmurs, rubs, or gallops.  ABDOMEN: Soft, nondistended, nontender. Bowel sounds present. No organomegaly or mass.  EXTREMITIES: No pedal edema, cyanosis, or clubbing.  NEUROLOGIC: Cranial nerves II through XII are intact. Muscle strength 5/5 in all extremities. Sensation intact. Gait not checked.  PSYCHIATRIC: The patient is alert and oriented x 3.  No  Condition at discharge: good  The results of significant diagnostics from this hospitalization (including imaging, microbiology, ancillary and laboratory) are listed below for reference.   Imaging Studies: ECHOCARDIOGRAM COMPLETE Result Date: 04/29/2024    ECHOCARDIOGRAM REPORT   Patient Name:   Amy [name] [facility] Date of Exam: 04/28/2024 Medical Rec #:  [id number]         Height:       65.0 in Accession #:    [id number]        Weight:       144.4 lb Date of Birth:  08-10-1929         BSA:          1.722 m Patient Age:    89 years          BP:           135/70 mmHg Patient Gender: F                 HR:           83 bpm. Exam Location:  [facility] Procedure: 2D Echo, Cardiac Doppler and Color Doppler (Both Spectral and Color            Flow Doppler were utilized during procedure). Indications:     I26.09 Pulmonary Embolus  History:         Patient has prior history of Echocardiogram examinations, most  recent 09/15/2023. Risk Factors:Dyslipidemia and Hypertension.  Sonographer:     Brigid Canada RDCS Referring Phys:  [id number] JAN A MANSY Diagnosing Phys: Lida Reeks Alluri IMPRESSIONS  1. Left ventricular ejection fraction, by estimation, is 55 to 60%. The left ventricle has normal function. The left ventricle has no regional wall motion abnormalities. Left ventricular diastolic parameters are consistent with Grade I diastolic dysfunction (impaired relaxation).  2. Right ventricular systolic function is normal. The right ventricular size is normal.  3. The mitral valve is normal in structure. Mild mitral valve regurgitation.  4. The aortic valve is tricuspid. There is mild thickening of the aortic valve. Aortic valve regurgitation is mild. FINDINGS  Left Ventricle: Left ventricular ejection fraction, by estimation, is 55 to 60%. The left ventricle has normal function. The left ventricle has no regional wall motion abnormalities. The left ventricular internal cavity size was normal in size. There is  no left ventricular hypertrophy. Left ventricular diastolic parameters are consistent with Grade I diastolic dysfunction (impaired relaxation). Right Ventricle: The right ventricular size is normal. No increase in right ventricular wall thickness. Right ventricular systolic function is normal. Left Atrium:  Left atrial size was normal in size. Right Atrium: Right atrial size was normal in size. Pericardium: There is no evidence of pericardial effusion. Mitral Valve: The mitral valve is normal in structure. Mild mitral valve regurgitation. Tricuspid Valve: The tricuspid valve is normal in structure. Tricuspid valve regurgitation is trivial. Aortic Valve: The aortic valve is tricuspid. There is mild thickening of the aortic valve. Aortic valve regurgitation is mild. Pulmonic Valve: The pulmonic valve was not well visualized. Pulmonic valve regurgitation is not visualized. Aorta: The aortic root and ascending aorta are structurally normal, with no evidence of dilitation. IAS/Shunts: The atrial septum is grossly normal.  LEFT VENTRICLE PLAX 2D LVIDd:         3.40 cm   Diastology LVIDs:         2.20 cm   LV e' medial:    4.24 cm/s LV PW:         0.80 cm   LV E/e' medial:  13.3 LV IVS:        0.90 cm   LV e' lateral:   7.18 cm/s LVOT diam:     1.90 cm   LV E/e' lateral: 7.9 LV SV:         32 LV SV Index:   19 LVOT Area:     2.84 cm  RIGHT VENTRICLE RV Basal diam:  3.40 cm RV S prime:     13.20 cm/s TAPSE (M-mode): 2.0 cm LEFT ATRIUM           Index        RIGHT ATRIUM           Index LA diam:      4.10 cm 2.38 cm/m   RA Area:     12.40 cm LA Vol (A2C): 19.4 ml 11.26 ml/m  RA Volume:   34.80 ml  20.20 ml/m LA Vol (A4C): 46.3 ml 26.88 ml/m  AORTIC VALVE LVOT Vmax:   66.90 cm/s LVOT Vmean:  44.033 cm/s LVOT VTI:    0.113 m  AORTA Ao Root diam: 2.90 cm Ao Asc diam:  3.30 cm MITRAL VALVE                TRICUSPID VALVE MV Area (PHT): 3.88 cm     TR Peak grad:   27.5 mmHg MV Decel Time: 196 msec  TR Vmax:        262.00 cm/s MV E velocity: 56.60 cm/s MV A velocity: 103.50 cm/s  SHUNTS MV E/A ratio:  0.55         Systemic VTI:  0.11 m                             Systemic Diam: 1.90 cm Joetta Mustache Electronically signed by Joetta Mustache Signature Date/Time: 04/29/2024/11:09:04 AM    Final    US  Venous Img Lower Bilateral  (DVT) Result Date: 04/28/2024 CLINICAL DATA:  Pulmonary embolism EXAM: BILATERAL LOWER EXTREMITY VENOUS DOPPLER ULTRASOUND TECHNIQUE: Gray-scale sonography with compression, as well as color and duplex ultrasound, were performed to evaluate the deep venous system(s) from the level of the common femoral vein through the popliteal and proximal calf veins. COMPARISON:  04/18/2021 FINDINGS: On the right: Occlusive thrombus in the common femoral vein continuing into the upper great saphenous vein. Nonocclusive thrombus seen throughout the remaining deep veins of the right lower extremity including the profunda, femoral vein, popliteal, and upper posterior tibial/peroneal veins. On the left: Normal compressibility of the common femoral, superficial femoral, and popliteal veins, as well as the visualized calf veins. Visualized portions of profunda femoral vein and great saphenous vein unremarkable. No filling defects to suggest DVT on grayscale or color Doppler imaging. Doppler waveforms show normal direction of venous flow, normal respiratory plasticity and response to augmentation. IMPRESSION: Extensive DVT throughout the right lower extremity veins, occlusive at the level of the common femoral vein. Negative for DVT in the left lower extremity. Electronically Signed   By: Ronnette Coke M.D.   On: 04/28/2024 05:34   CT ABDOMEN PELVIS W CONTRAST Addendum Date: 04/27/2024 ADDENDUM REPORT: 04/27/2024 19:20 ADDENDUM: These results were called by telephone at the time of interpretation on 04/27/2024 at 7:20 pm to provider NEHA RAY , who verbally acknowledged these results. Electronically Signed   By: Tyron Gallon M.D.   On: 04/27/2024 19:20   Result Date: 04/27/2024 CLINICAL DATA:  Acute abdominal pain EXAM: CT ABDOMEN AND PELVIS WITH CONTRAST TECHNIQUE: Multidetector CT imaging of the abdomen and pelvis was performed using the standard protocol following bolus administration of intravenous contrast. RADIATION DOSE  REDUCTION: This exam was performed according to the departmental dose-optimization program which includes automated exposure control, adjustment of the mA and/or kV according to patient size and/or use of iterative reconstruction technique. CONTRAST:  OMNIPAQUE  IOHEXOL  300 MG/ML  SOLN COMPARISON:  CT PE protocol 10/03/2023. CT abdomen and pelvis 09/08/2023. FINDINGS: Lower chest: There is atelectasis in the lung bases with trace bilateral pleural effusions. Pulmonary emboli are seen within the distal right main pulmonary artery extending into segmental branches of the right lower lobe. Hepatobiliary: The gallbladder surgically absent. There is intra and extrahepatic biliary ductal dilatation with common bowel duct measuring up to 15 mm. This appears unchanged from prior. No focal liver lesions are identified. Pancreas: Unremarkable. No pancreatic ductal dilatation or surrounding inflammatory changes. Spleen: Normal in size without focal abnormality. Adrenals/Urinary Tract: Bilateral renal cysts are again noted measuring up to 2 cm. There is no hydronephrosis or perinephric stranding. The adrenal glands and bladder are within normal limits. Stomach/Bowel: There is a large hiatal hernia containing the proximal stomach. There is fluid distension of the distal esophagus and wall thickening at the gastroesophageal junction. The intra-abdominal portion of the stomach appears normal. The appendix is not seen. There is sigmoid colon diverticulosis. No  focal inflammatory change or pneumatosis. No free air. Vascular/Lymphatic: Aortic atherosclerosis. No enlarged abdominal or pelvic lymph nodes. Reproductive: Status post hysterectomy. No adnexal masses. Other: No abdominal wall hernia or abnormality. No abdominopelvic ascites. Musculoskeletal: T11 and T12 vertebral plasty changes are present. The bones are diffusely osteopenic. Degenerative changes also affect the hips. IMPRESSION: 1. Pulmonary emboli within the distal  right main pulmonary artery extending into segmental branches of the right lower lobe. 2. Large hiatal hernia containing the proximal stomach. There is fluid distension of the distal esophagus and wall thickening at the gastroesophageal junction. Correlate clinically for esophagitis/gastritis. Underlying lesion not excluded. 3. Stable intra and extrahepatic biliary ductal dilatation status post cholecystectomy. 4. Sigmoid colon diverticulosis. 5. Aortic atherosclerosis. Aortic Atherosclerosis (ICD10-I70.0). Electronically Signed: By: Tyron Gallon M.D. On: 04/27/2024 19:15    Microbiology: Results for orders placed or performed during the hospital encounter of 10/03/23  Resp panel by RT-PCR (RSV, Flu A&B, Covid) Anterior Nasal Swab     Status: None   Collection Time: 10/03/23  8:57 AM   Specimen: Anterior Nasal Swab  Result Value Ref Range Status   SARS Coronavirus 2 by RT PCR NEGATIVE NEGATIVE Final    Comment: (NOTE) SARS-CoV-2 target nucleic acids are NOT DETECTED.  The SARS-CoV-2 RNA is generally detectable in upper respiratory specimens during the acute phase of infection. The lowest concentration of SARS-CoV-2 viral copies this assay can detect is 138 copies/mL. A negative result does not preclude SARS-Cov-2 infection and should not be used as the sole basis for treatment or other patient management decisions. A negative result may occur with  improper specimen collection/handling, submission of specimen other than nasopharyngeal swab, presence of viral mutation(s) within the areas targeted by this assay, and inadequate number of viral copies(<138 copies/mL). A negative result must be combined with clinical observations, patient history, and epidemiological information. The expected result is Negative.  Fact Sheet for Patients:  BloggerCourse.com  Fact Sheet for Healthcare Providers:  SeriousBroker.it  This test is no t yet  approved or cleared by the [place]  FDA and  has been authorized for detection and/or diagnosis of SARS-CoV-2 by FDA under an Emergency Use Authorization (EUA). This EUA will remain  in effect (meaning this test can be used) for the duration of the COVID-19 declaration under Section 564(b)(1) of the Act, 21 U.S.C.section 360bbb-3(b)(1), unless the authorization is terminated  or revoked sooner.       Influenza A by PCR NEGATIVE NEGATIVE Final   Influenza B by PCR NEGATIVE NEGATIVE Final    Comment: (NOTE) The Xpert Xpress SARS-CoV-2/FLU/RSV plus assay is intended as an aid in the diagnosis of influenza from Nasopharyngeal swab specimens and should not be used as a sole basis for treatment. Nasal washings and aspirates are unacceptable for Xpert Xpress SARS-CoV-2/FLU/RSV testing.  Fact Sheet for Patients: BloggerCourse.com  Fact Sheet for Healthcare Providers: SeriousBroker.it  This test is not yet approved or cleared by the [place]  FDA and has been authorized for detection and/or diagnosis of SARS-CoV-2 by FDA under an Emergency Use Authorization (EUA). This EUA will remain in effect (meaning this test can be used) for the duration of the COVID-19 declaration under Section 564(b)(1) of the Act, 21 U.S.C. section 360bbb-3(b)(1), unless the authorization is terminated or revoked.     Resp Syncytial Virus by PCR NEGATIVE NEGATIVE Final    Comment: (NOTE) Fact Sheet for Patients: BloggerCourse.com  Fact Sheet for Healthcare Providers: SeriousBroker.it  This test is not yet approved or cleared  by the [place]  FDA and has been authorized for detection and/or diagnosis of SARS-CoV-2 by FDA under an Emergency Use Authorization (EUA). This EUA will remain in effect (meaning this test can be used) for the duration of the COVID-19 declaration under Section 564(b)(1) of  the Act, 21 U.S.C. section 360bbb-3(b)(1), unless the authorization is terminated or revoked.  Performed at [facility], [address] [place], [place] [address]     Labs: CBC: Recent Labs  Lab 04/27/24 1624 04/28/24 0439 04/29/24 0600  WBC 13.7* 8.9 8.3  NEUTROABS  --   --  5.9  HGB 14.4 12.2 11.5*  HCT 45.3 37.9 38.0  MCV 86.6 88.1 90.5  PLT 303 210 197   Basic Metabolic Panel: Recent Labs  Lab 04/27/24 1624 04/28/24 0439 04/29/24 0600  NA 141 142 142  K 3.3* 3.1* 5.0  CL 96* 102 105  CO2 31 29 27   GLUCOSE 145* 133* 122*  BUN 38* 36* 30*  CREATININE 0.88 0.85 0.86  CALCIUM  10.2 9.1 9.0   Liver Function Tests: Recent Labs  Lab 04/27/24 1624  AST 19  ALT 15  ALKPHOS 79  BILITOT 0.9  PROT 6.3*  ALBUMIN 3.7   CBG: No results for input(s): "GLUCAP" in the last 168 hours.  Discharge time spent:  35  minutes.  Signed: Ezzard Holms, MD [facility] 04/29/2024

## 2024-04-29 NOTE — Plan of Care (Signed)

## 2024-04-29 NOTE — Consult Note (Signed)
 PHARMACY - ANTICOAGULATION CONSULT NOTE  Pharmacy Consult for Heparin  Indication: pulmonary embolus  Allergies  Allergen Reactions   Doxycycline Hyclate Nausea Only   Levofloxacin  Nausea And Vomiting   Sulfa Antibiotics Rash    Other Reaction: Throat feels funny    Patient Measurements: Height: 5\' 5"  (165.1 cm) Weight: 65.5 kg (144 lb 6.4 oz) IBW/kg (Calculated) : 57 HEPARIN  DW (KG): 65.5  Vital Signs: Temp: 98 F (36.7 C) (05/07 1520) Temp Source: Oral (05/07 1520) BP: 130/62 (05/08 0100) Pulse Rate: 76 (05/08 0100)  Labs: Recent Labs    04/27/24 1624 04/27/24 1938 04/27/24 2005 04/28/24 0439 04/28/24 1459 04/29/24 0117  HGB 14.4  --   --  12.2  --   --   HCT 45.3  --   --  37.9  --   --   PLT 303  --   --  210  --   --   APTT  --   --  29  --   --   --   LABPROT  --   --  13.8  --   --   --   INR  --   --  1.0  --   --   --   HEPARINUNFRC  --   --   --  >1.10* >1.10* 0.56  CREATININE 0.88  --   --  0.85  --   --   TROPONINIHS 13 13  --   --   --   --     Estimated Creatinine Clearance: 38 mL/min (by C-G formula based on SCr of 0.85 mg/dL).   Medical History: Past Medical History:  Diagnosis Date   Arthritis    Depression    Diverticulitis    DVT (deep venous thrombosis) (HCC)    Right leg    Dyspnea    Family history of adverse reaction to anesthesia    PONV sister   GERD (gastroesophageal reflux disease)    History of hiatal hernia    Hyperlipidemia    Hypertension    Intractable vomiting with nausea 11/25/2015   Mitral valve regurgitation    Neuromuscular disorder (HCC)    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HCC)    Pneumonia    Renal disorder    Medications:  (Not in a hospital admission)  Scheduled:  Infusions:  PRN:  Anti-infectives (From admission, onward)    None      Assessment: 89 year old female presented with tachycardia and mild SOB. CT scan showed: Pulmonary emboli within the distal right main pulmonary artery extending into segmental  branches of the right lower lobe. No DOAC PTA. Hgb and plt stable.   Date Time HL Rate/Comment 05/07 0439 >1.10 Supratherapeutic on 1000 units/hr 05/07 1537 >1.10 Supratherapeutic on 750 units/hr 05/08   0056      0.56  Therapeutic X 1   Goal of Therapy:  Heparin  level 0.3-0.7 units/ml Monitor platelets by anticoagulation protocol: Yes   Pla 5/8: HL @ 0056 = 0.56, therapeutic X 1 - RN reports there have not been any additional signs of bleeding with this pt  - Will continue pt on current rate and recheck HL in 8 hrs Monitor CBC and signs/symptoms of bleeding Continue to monitor oozing from IV sites, notify MD if worsens  Thank you for involving pharmacy in this patient's care.   Zyliah Schier D Clinical Pharmacist 04/29/2024 1:48 AM

## 2024-04-29 NOTE — TOC Progression Note (Addendum)
 Transition of Care [facility]) - Progression Note    Patient Details  Name: Amy [name] MRN: [id number] Date of Birth: Aug 11, 1929  [facility] CM/SW Contact  Baird Bombard, RN Phone Number: 04/29/2024, 11:17 AM  Clinical Narrative:    Spoke with patient's Daughter, [name] regarding discharge. She is requesting EMS transport. She is coming to the hospital but will follow EMS to be home to let patient in.   Face sheet and medical necessity forms printed to the floor to be added to EMS packet. EMS arranged"She's third on the list."  Nurse notified.   TOC signing off.          Expected Discharge Plan and Services         Expected Discharge Date: 04/29/24                                     Social Determinants of Health (SDOH) Interventions SDOH Screenings   Food Insecurity: No Food Insecurity (04/28/2024)  Housing: Low Risk  (04/28/2024)  Transportation Needs: No Transportation Needs (04/28/2024)  Utilities: Not At Risk (04/28/2024)  Social Connections: Socially Isolated (04/28/2024)  Tobacco Use: Low Risk  (04/27/2024)    Readmission Risk Interventions     No data to display

## 2024-05-04 ENCOUNTER — Encounter: Payer: Self-pay | Admitting: Family Medicine

## 2024-05-04 ENCOUNTER — Emergency Department

## 2024-05-04 ENCOUNTER — Other Ambulatory Visit: Payer: Self-pay

## 2024-05-04 ENCOUNTER — Observation Stay
Admission: EM | Admit: 2024-05-04 | Discharge: 2024-05-05 | Disposition: A | Discharge status: Home / Self Care | Attending: Internal Medicine | Admitting: Internal Medicine

## 2024-05-04 DIAGNOSIS — K5900 Constipation, unspecified: Secondary | ICD-10-CM | POA: Diagnosis present

## 2024-05-04 DIAGNOSIS — Z86711 Personal history of pulmonary embolism: Secondary | ICD-10-CM | POA: Diagnosis not present

## 2024-05-04 DIAGNOSIS — K2100 Gastro-esophageal reflux disease with esophagitis, without bleeding: Secondary | ICD-10-CM | POA: Diagnosis present

## 2024-05-04 DIAGNOSIS — K922 Gastrointestinal hemorrhage, unspecified: Secondary | ICD-10-CM | POA: Diagnosis not present

## 2024-05-04 DIAGNOSIS — I10 Essential (primary) hypertension: Secondary | ICD-10-CM | POA: Diagnosis present

## 2024-05-04 DIAGNOSIS — Z7901 Long term (current) use of anticoagulants: Secondary | ICD-10-CM | POA: Insufficient documentation

## 2024-05-04 DIAGNOSIS — K219 Gastro-esophageal reflux disease without esophagitis: Secondary | ICD-10-CM | POA: Diagnosis not present

## 2024-05-04 DIAGNOSIS — K920 Hematemesis: Secondary | ICD-10-CM | POA: Diagnosis not present

## 2024-05-04 DIAGNOSIS — Z86718 Personal history of other venous thrombosis and embolism: Secondary | ICD-10-CM | POA: Diagnosis not present

## 2024-05-04 DIAGNOSIS — K209 Esophagitis, unspecified without bleeding: Secondary | ICD-10-CM | POA: Diagnosis not present

## 2024-05-04 DIAGNOSIS — Z79899 Other long term (current) drug therapy: Secondary | ICD-10-CM | POA: Diagnosis not present

## 2024-05-04 DIAGNOSIS — I2699 Other pulmonary embolism without acute cor pulmonale: Secondary | ICD-10-CM | POA: Diagnosis present

## 2024-05-04 LAB — COMPREHENSIVE METABOLIC PANEL WITH GFR
ALT: 14 U/L (ref 0–44)
AST: 21 U/L (ref 15–41)
Albumin: 3 g/dL — ABNORMAL LOW (ref 3.5–5.0)
Alkaline Phosphatase: 64 U/L (ref 38–126)
Anion gap: 13 (ref 5–15)
BUN: 28 mg/dL — ABNORMAL HIGH (ref 8–23)
CO2: 30 mmol/L (ref 22–32)
Calcium: 9.2 mg/dL (ref 8.9–10.3)
Chloride: 100 mmol/L (ref 98–111)
Creatinine, Ser: 0.66 mg/dL (ref 0.44–1.00)
GFR, Estimated: >60 mL/min (ref >60)
Glucose, Bld: 126 mg/dL — ABNORMAL HIGH (ref 70–99)
Potassium: 4.1 mmol/L (ref 3.5–5.1)
Sodium: 143 mmol/L (ref 135–145)
Total Bilirubin: 0.8 mg/dL (ref 0.0–1.2)
Total Protein: 5.7 g/dL — ABNORMAL LOW (ref 6.5–8.1)

## 2024-05-04 LAB — CBC
HCT: 39.2 % (ref 36.0–46.0)
Hemoglobin: 12.3 g/dL (ref 12.0–15.0)
MCH: 28.3 pg (ref 26.0–34.0)
MCHC: 31.4 g/dL (ref 30.0–36.0)
MCV: 90.1 fL (ref 80.0–100.0)
Platelets: 286 10*3/uL (ref 150–400)
RBC: 4.35 MIL/uL (ref 3.87–5.11)
RDW: 16.9 % — ABNORMAL HIGH (ref 11.5–15.5)
WBC: 17 10*3/uL — ABNORMAL HIGH (ref 4.0–10.5)
nRBC: 0 % (ref 0.0–0.2)

## 2024-05-04 LAB — HEMOGLOBIN AND HEMATOCRIT, BLOOD
HCT: 39.7 % (ref 36.0–46.0)
HCT: 36.1 % (ref 36.0–46.0)
Hemoglobin: 12.4 g/dL (ref 12.0–15.0)
Hemoglobin: 11.4 g/dL — ABNORMAL LOW (ref 12.0–15.0)

## 2024-05-04 LAB — PROTIME-INR
INR: 2.7 — ABNORMAL HIGH (ref 0.8–1.2)
Prothrombin Time: 28.5 s — ABNORMAL HIGH (ref 11.4–15.2)

## 2024-05-04 LAB — APTT: aPTT: 43 s — ABNORMAL HIGH (ref 24–36)

## 2024-05-04 LAB — TYPE AND SCREEN
ABO/RH(D): O POS
Antibody Screen: NEGATIVE

## 2024-05-04 MED ORDER — ONDANSETRON HCL 4 MG/2ML IJ SOLN
4.0000 mg | Freq: Four times a day (QID) | INTRAMUSCULAR | Status: DC | PRN
  Administered 2024-05-05: 4 mg via INTRAVENOUS
  Filled 2024-05-04: qty 2

## 2024-05-04 MED ORDER — ACETAMINOPHEN 325 MG PO TABS: 650.0000 mg | ORAL_TABLET | Freq: Four times a day (QID) | ORAL | Status: DC | PRN

## 2024-05-04 MED ORDER — ONDANSETRON HCL 4 MG PO TABS: 4.0000 mg | ORAL_TABLET | Freq: Four times a day (QID) | ORAL | Status: DC | PRN

## 2024-05-04 MED ORDER — ONDANSETRON HCL 4 MG/2ML IJ SOLN
4.0000 mg | Freq: Once | INTRAMUSCULAR | Status: AC
  Administered 2024-05-04: 4 mg via INTRAVENOUS
  Filled 2024-05-04: qty 2

## 2024-05-04 MED ORDER — PANTOPRAZOLE SODIUM 40 MG IV SOLR
40.0000 mg | Freq: Two times a day (BID) | INTRAVENOUS | Status: DC
  Administered 2024-05-04 – 2024-05-05 (×2): 40 mg via INTRAVENOUS
  Filled 2024-05-04 (×2): qty 10

## 2024-05-04 NOTE — H&P (Addendum)
 History and Physical    Patient: Amy [name] ZOX:[id number] DOB: 10/01/1929 DOA: 05/04/2024 DOS: the patient was seen and examined on 05/04/2024 PCP: Sari Cunning, MD  Patient coming from: Home  Chief Complaint:  Chief Complaint  Patient presents with   Hematemesis   Constipation   HPI: Amy [name] is a 89 y.o. female with medical history significant of osteoarthritis, depression, right leg DVT, GERD, hypertension, dyslipidemia and plasmacytoma presenting with upper GI bleeding.  Family reports multiple days of coffee-ground emesis.  Noted to have been recently admitted May 6 to May 8 for issues including PE/DVT.  Patient also had melanotic stools and bright red blood per rectum during that admission.  CT imaging was concerning for esophagitis/gastritis.  Gastroenterology was consulted.  However, family refused endoscopy/colonoscopy.  Was also started on Eliquis  for PE/DVT treatment.  Per report, family has been compliant with medication administration the patient.  No reported chest pain or shortness of breath.  No reported abdominal pain or diarrhea.  Noted generalized weakness chronically. Has had multiple episodes of coffee ground emesis at home.  Presented to the ER afebrile, hemodynamically stable.  Satting well on room air.  White count 17, hemoglobin 12.3, platelets 286, creatinine 0.66.  Glucose 126.  KUB within norm limits. Review of Systems: As mentioned in the history of present illness. All other systems reviewed and are negative. Past Medical History:  Diagnosis Date   Arthritis    Depression    Diverticulitis    DVT (deep venous thrombosis) (HCC)    Right leg    Dyspnea    Family history of adverse reaction to anesthesia    PONV sister   GERD (gastroesophageal reflux disease)    History of hiatal hernia    Hyperlipidemia    Hypertension    Intractable vomiting with nausea 11/25/2015   Mitral valve regurgitation    Neuromuscular disorder (HCC)     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HCC)    Pneumonia    Renal disorder    Past Surgical History:  Procedure Laterality Date   ABDOMINAL HYSTERECTOMY     partial   ABDOMINAL SURGERY     bladder tack  1990   BREAST BIOPSY  1974   CATARACT EXTRACTION, BILATERAL     CERVICAL SPINE SURGERY     tumor removed   HERNIA REPAIR     KYPHOPLASTY N/A 03/12/2018   Procedure: [id number];  Surgeon: Molli Angelucci, MD;  Location: [facility];  Service: Orthopedics;  Laterality: N/A;   KYPHOPLASTY N/A 06/30/2018   Procedure: Kathern Panda;  Surgeon: Molli Angelucci, MD;  Location: [facility] OR[facility];  Service: Orthopedics;  Laterality: N/A;   plasmacytoma resection  2005   lumbar   Social History:  reports that she has never smoked. She has never used smokeless tobacco. She reports that she does not drink alcohol  and does not use drugs.  Allergies  Allergen Reactions   Doxycycline Hyclate Nausea Only   Levofloxacin  Nausea And Vomiting   Sulfa Antibiotics Rash    Other Reaction: Throat feels funny    Family History  Problem Relation Age of Onset   Heart failure Mother    Heart failure Father    COPD Sister    Prostate cancer Brother    COPD Brother     Prior to Admission medications   Medication Sig Start Date End Date Taking? Authorizing Provider  acetaminophen  (TYLENOL ) 500 MG tablet Take 1,000 mg by mouth 3 (three) times daily.   Yes [provider]  ALLERGY  RELIEF 10 MG tablet Take 10 mg by mouth daily. 01/26/24  Yes [provider]  APIXABAN  (ELIQUIS ) VTE STARTER PACK (10MG  AND 5MG ) Take as directed on package: start with two-5mg  tablets twice daily for 7 days. On day 8, switch to one-5mg  tablet twice daily. 04/29/24  Yes Djan, Esther Hem, MD  CALMOSEPTINE 0.44-20.6 % OINT Apply 1 Application topically in the morning and at bedtime. With every shift apply moderate amount to sacral areas and groin 09/22/23  Yes [provider]  COLD & HOT PLUS MENTHOL 4-1 % PTCH Apply 1 patch topically daily.  09/13/23  Yes [provider]  diltiazem  (CARDIZEM  CD) 120 MG 24 hr capsule Take 120 mg by mouth daily. 08/18/23 08/17/24 Yes [provider]  DULoxetine  (CYMBALTA ) 20 MG capsule Take 20 mg by mouth at bedtime. 07/27/23  Yes [provider]  esomeprazole (NEXIUM) 40 MG capsule Take 40 mg by mouth daily. 03/03/23  Yes [provider]  gabapentin  (NEURONTIN ) 100 MG capsule Take 2 capsules (200 mg total) by mouth at bedtime. 01/28/24  Yes Hyatt, Max T, DPM  guaiFENesin -dextromethorphan (ROBITUSSIN DM) 100-10 MG/5ML syrup Take 5 mLs by mouth every 4 (four) hours as needed for cough. 09/12/23  Yes Luna Salinas, MD  hydrOXYzine  (ATARAX ) 25 MG tablet Take 1 tablet (25 mg total) by mouth 3 (three) times daily as needed for anxiety. 09/12/23  Yes Luna Salinas, MD  lidocaine  (LIDODERM ) 5 % Place 1 patch onto the skin daily. Remove & Discard patch within 12 hours or as directed by MD 09/22/23  Yes Wouk, Haynes Lips, MD  melatonin 5 MG TABS Take 0.5 tablets (2.5 mg total) by mouth at bedtime. 09/12/23  Yes Luna Salinas, MD  Multiple Vitamins-Minerals (PRESERVISION AREDS PO) Take by mouth.   Yes [provider]  ondansetron  (ZOFRAN ) 4 MG tablet Take 4 mg by mouth every 8 (eight) hours as needed for nausea or vomiting.   Yes [provider]  polyethylene glycol (MIRALAX  / GLYCOLAX ) 17 g packet Take 17 g by mouth daily. 09/23/23  Yes Wouk, Haynes Lips, MD  QUEtiapine  (SEROQUEL ) 50 MG tablet Take 50 mg by mouth at bedtime. 07/27/23  Yes [provider]  THERATEARS PF 0.25 % SOLN Apply 1 drop to eye 4 (four) times daily. 12/14/23  Yes [provider]  torsemide  (DEMADEX ) 10 MG tablet Take 1 tablet (10 mg total) by mouth daily. 10/06/23  Yes Ree Candy, MD  apixaban  (ELIQUIS ) 5 MG TABS tablet Take 1 tablet (5 mg total) by mouth 2 (two) times daily. 05/07/24   Ezzard Holms, MD  apixaban  (ELIQUIS ) 5 MG TABS tablet Take 1 tablet (5 mg total) by mouth  2 (two) times daily for 7 days. 04/29/24 05/06/24  Ezzard Holms, MD    Physical Exam: Vitals:   05/04/24 1200 05/04/24 1215 05/04/24 1235 05/04/24 1536  BP: 116/71  (S) 115/77 126/73  Pulse: 97 100 (S) (!) 103 (!) 102  Resp:   (S) 16 20  Temp:  97.6 F (36.4 C) (S) 97.9 F (36.6 C) 97.8 F (36.6 C)  TempSrc:  Oral (S) Oral Oral  SpO2: 99% 99% (S) 100% 100%  Weight:      Height:       Physical Exam HENT:     Head: Normocephalic and atraumatic.     Nose: Nose normal.     Mouth/Throat:     Mouth: Mucous membranes are dry.  Eyes:     Pupils: Pupils  are equal, round, and reactive to light.  Cardiovascular:     Rate and Rhythm: Normal rate and regular rhythm.  Pulmonary:     Effort: Pulmonary effort is normal.  Abdominal:     General: Bowel sounds are normal.  Musculoskeletal:        General: Normal range of motion.  Skin:    General: Skin is warm.  Neurological:     General: No focal deficit present.  Psychiatric:        Mood and Affect: Mood normal.     Data Reviewed:  There are no new results to review at this time.  DG Abdomen 1 View CLINICAL DATA:  Constipation.  EXAM: ABDOMEN - 1 VIEW  COMPARISON:  February 01, 2018.  FINDINGS: The bowel gas pattern is normal. No significant stool burden is noted. Phleboliths are noted in the pelvis.  IMPRESSION: No abnormal bowel dilatation.  Electronically Signed   By: Rosalene Colon M.D.   On: 05/04/2024 09:45  Lab Results  Component Value Date   WBC 17.0 (H) 05/04/2024   HGB 12.3 05/04/2024   HCT 39.2 05/04/2024   MCV 90.1 05/04/2024   PLT 286 05/04/2024   Last metabolic panel Lab Results  Component Value Date   GLUCOSE 126 (H) 05/04/2024   NA 143 05/04/2024   K 4.1 05/04/2024   CL 100 05/04/2024   CO2 30 05/04/2024   BUN 28 (H) 05/04/2024   CREATININE 0.66 05/04/2024   GFRNONAA >60 05/04/2024   CALCIUM  9.2 05/04/2024   PROT 5.7 (L) 05/04/2024   ALBUMIN 3.0 (L) 05/04/2024   LABGLOB 2.9  08/02/2020   AGRATIO 1.9 05/13/2016   BILITOT 0.8 05/04/2024   ALKPHOS 64 05/04/2024   AST 21 05/04/2024   ALT 14 05/04/2024   ANIONGAP 13 05/04/2024    Assessment and Plan: * UGIB (upper gastrointestinal bleed) Coffee-ground emesis over multiple days in the setting of Eliquis  use associated with recent diagnosis of PE and DVT Noted similar issue in the past with family refusing EGD secondary to concern for complication risk Hemoglobin 12.3 Will hold offending agent Family now agreeable to potential EGD Will consult Dr. Ole Berkeley for evaluation Trend hemoglobin Transfuse for hemoglobin less than 7 Monitor   Pulmonary embolism (HCC) Recent diagnosis of pulmonary embolism and DVT on May 2025 admission Holding Eliquis  in the setting of upper GI bleeding Monitor for any decompensation from a cardiac or respiratory standpoint Follow  GERD with esophagitis PPI  Hypertension BP stable Titrate home regimen      Advance Care Planning:   Code Status: Limited: Do not attempt resuscitation (DNR) -DNR-LIMITED -Do Not Intubate/DNI    Consults: GI   Family Communication: Family at the bedside   Severity of Illness: The appropriate patient status for this patient is OBSERVATION. Observation status is judged to be reasonable and necessary in order to provide the required intensity of service to ensure the patient's safety. The patient's presenting symptoms, physical exam findings, and initial radiographic and laboratory data in the context of their medical condition is felt to place them at decreased risk for further clinical deterioration. Furthermore, it is anticipated that the patient will be medically stable for discharge from the hospital within 2 midnights of admission.   Author: Corrinne Din, MD 05/04/2024 5:06 PM  For on call review [email or web address].

## 2024-05-04 NOTE — ED Notes (Signed)
 Pt with large, liquid BM, dark brown in color. MD visualized. Perineal care performed and new incontinence brief placed on pt.

## 2024-05-04 NOTE — Assessment & Plan Note (Signed)
 Recent diagnosis of pulmonary embolism and DVT on May 2025 admission Holding Eliquis  in the setting of upper GI bleeding Monitor for any decompensation from a cardiac or respiratory standpoint Follow

## 2024-05-04 NOTE — Progress Notes (Signed)
 Admission VS: Pt just arrived to unit

## 2024-05-04 NOTE — Consult Note (Signed)
 Amy Sink, MD [facility]  [address] [place], [place] [address] Phone: [phone] Fax : [phone]  Consultation  Referring Provider:     Dr. Daisey Dryer Primary Care Physician:  Sari Cunning, MD Primary Gastroenterologist: [facility]         Reason for Consultation:     Coffee-ground emesis  Date of Admission:  05/04/2024 Date of Consultation:  05/04/2024         HPI:   Amy [name] is a 89 y.o. female who has a history of chronic constipation and a history of coffee-ground emesis.  The patient was reported to have coffee-ground emesis yesterday and this morning.  The patient is not able to give much history and her caregiver and her daughter were the source of my information.  The patient's family had refused endoscopic evaluation in the past.  The patient also has had a problem with constipation and there was concern about possible fecal impaction.  The vomitus was reported to be brown in color without any bright red blood. Despite refusing endoscopic intervention in the past the patient's daughter was contacted by the ED physician and the daughter appeared to favor GI intervention at this time.  Past Medical History:  Diagnosis Date   Arthritis    Depression    Diverticulitis    DVT (deep venous thrombosis) (HCC)    Right leg    Dyspnea    Family history of adverse reaction to anesthesia    PONV sister   GERD (gastroesophageal reflux disease)    History of hiatal hernia    Hyperlipidemia    Hypertension    Intractable vomiting with nausea 11/25/2015   Mitral valve regurgitation    Neuromuscular disorder (HCC)    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HCC)    Pneumonia    Renal disorder     Past Surgical History:  Procedure Laterality Date   ABDOMINAL HYSTERECTOMY     partial   ABDOMINAL SURGERY     bladder tack  1990   BREAST BIOPSY  1974   CATARACT EXTRACTION, BILATERAL     CERVICAL SPINE SURGERY     tumor removed   HERNIA REPAIR     KYPHOPLASTY N/A 03/12/2018    Procedure: [id number];  Surgeon: Molli Angelucci, MD;  Location: [facility] ORS;  Service: Orthopedics;  Laterality: N/A;   KYPHOPLASTY N/A 06/30/2018   Procedure: Kathern Panda;  Surgeon: Molli Angelucci, MD;  Location: [facility] ORS;  Service: Orthopedics;  Laterality: N/A;   plasmacytoma resection  2005   lumbar    Prior to Admission medications   Medication Sig Start Date End Date Taking? Authorizing Provider  acetaminophen  (TYLENOL ) 500 MG tablet Take 1,000 mg by mouth 3 (three) times daily.   Yes [provider]  ALLERGY RELIEF 10 MG tablet Take 10 mg by mouth daily. 01/26/24  Yes [provider]  APIXABAN  (ELIQUIS ) VTE STARTER PACK (10MG  AND 5MG ) Take as directed on package: start with two-5mg  tablets twice daily for 7 days. On day 8, switch to one-5mg  tablet twice daily. 04/29/24  Yes Djan, Esther Hem, MD  CALMOSEPTINE 0.44-20.6 % OINT Apply 1 Application topically in the morning and at bedtime. With every shift apply moderate amount to sacral areas and groin 09/22/23  Yes [provider]  COLD & HOT PLUS MENTHOL 4-1 % PTCH Apply 1 patch topically daily. 09/13/23  Yes [provider]  diltiazem  (CARDIZEM  CD) 120 MG 24 hr capsule Take 120 mg by mouth daily. 08/18/23  08/17/24 Yes [provider]  DULoxetine  (CYMBALTA ) 20 MG capsule Take 20 mg by mouth at bedtime. 07/27/23  Yes [provider]  esomeprazole (NEXIUM) 40 MG capsule Take 40 mg by mouth daily. 03/03/23  Yes [provider]  gabapentin  (NEURONTIN ) 100 MG capsule Take 2 capsules (200 mg total) by mouth at bedtime. 01/28/24  Yes Hyatt, Max T, DPM  guaiFENesin -dextromethorphan (ROBITUSSIN DM) 100-10 MG/5ML syrup Take 5 mLs by mouth every 4 (four) hours as needed for cough. 09/12/23  Yes Luna Salinas, MD  hydrOXYzine  (ATARAX ) 25 MG tablet Take 1 tablet (25 mg total) by mouth 3 (three) times daily as needed for anxiety. 09/12/23  Yes Luna Salinas, MD  lidocaine  (LIDODERM ) 5 % Place 1 patch onto the  skin daily. Remove & Discard patch within 12 hours or as directed by MD 09/22/23  Yes Wouk, Haynes Lips, MD  melatonin 5 MG TABS Take 0.5 tablets (2.5 mg total) by mouth at bedtime. 09/12/23  Yes Luna Salinas, MD  Multiple Vitamins-Minerals (PRESERVISION AREDS PO) Take by mouth.   Yes [provider]  ondansetron  (ZOFRAN ) 4 MG tablet Take 4 mg by mouth every 8 (eight) hours as needed for nausea or vomiting.   Yes [provider]  polyethylene glycol (MIRALAX  / GLYCOLAX ) 17 g packet Take 17 g by mouth daily. 09/23/23  Yes Wouk, Haynes Lips, MD  QUEtiapine  (SEROQUEL ) 50 MG tablet Take 50 mg by mouth at bedtime. 07/27/23  Yes [provider]  THERATEARS PF 0.25 % SOLN Apply 1 drop to eye 4 (four) times daily. 12/14/23  Yes [provider]  torsemide  (DEMADEX ) 10 MG tablet Take 1 tablet (10 mg total) by mouth daily. 10/06/23  Yes Ree Candy, MD  apixaban  (ELIQUIS ) 5 MG TABS tablet Take 1 tablet (5 mg total) by mouth 2 (two) times daily. 05/07/24   Ezzard Holms, MD  apixaban  (ELIQUIS ) 5 MG TABS tablet Take 1 tablet (5 mg total) by mouth 2 (two) times daily for 7 days. 04/29/24 05/06/24  Ezzard Holms, MD    Family History  Problem Relation Age of Onset   Heart failure Mother    Heart failure Father    COPD Sister    Prostate cancer Brother    COPD Brother      Social History   Tobacco Use   Smoking status: Never   Smokeless tobacco: Never  Vaping Use   Vaping status: Never Used  Substance Use Topics   Alcohol  use: No    Alcohol /week: 0.0 standard drinks of alcohol    Drug use: No    Allergies as of 05/04/2024 - Review Complete 05/04/2024  Allergen Reaction Noted   Doxycycline hyclate Nausea Only 11/09/2015   Levofloxacin  Nausea And Vomiting 01/08/2016   Sulfa antibiotics Rash 11/09/2015    Review of Systems:    All systems reviewed and negative except where noted in HPI.   Physical Exam:  Vital signs in last 24 hours: Temp:  [97.6 F  (36.4 C)-98.2 F (36.8 C)] 97.8 F (36.6 C) (05/13 1536) Pulse Rate:  [97-114] 102 (05/13 1536) Resp:  [16-20] 20 (05/13 1536) BP: (107-129)/(69-78) 126/73 (05/13 1536) SpO2:  [98 %-100 %] 100 % (05/13 1536) Weight:  [65.5 kg] 65.5 kg (05/13 0812) Last BM Date : 05/04/24 General:   Pleasant, cooperative in NAD Head:  Normocephalic and atraumatic. Eyes:   No icterus.   Conjunctiva pink. PERRLA. Ears:  Normal auditory acuity. Neck:  Supple; no masses or thyroidomegaly Lungs:  Respirations even and unlabored. Lungs clear to auscultation bilaterally.   No wheezes, crackles, or rhonchi.  Heart:  Regular rate and rhythm;  Without murmur, clicks, rubs or gallops Abdomen:  Soft, nondistended, nontender. Normal bowel sounds. No appreciable masses or hepatomegaly.  No rebound or guarding.  Rectal:  Not performed. Msk:  Symmetrical without gross deformities.   Extremities:  Without edema, cyanosis or clubbing. Neurologic:  Alert ;  grossly normal neurologically. Skin:  Intact without significant lesions or rashes. Cervical Nodes:  No significant cervical adenopathy. Psych:  Alert and cooperative. Normal affect.  LAB RESULTS: Recent Labs    05/04/24 0829  WBC 17.0*  HGB 12.3  HCT 39.2  PLT 286   BMET Recent Labs    05/04/24 0829  NA 143  K 4.1  CL 100  CO2 30  GLUCOSE 126*  BUN 28*  CREATININE 0.66  CALCIUM  9.2   LFT Recent Labs    05/04/24 0829  PROT 5.7*  ALBUMIN 3.0*  AST 21  ALT 14  ALKPHOS 64  BILITOT 0.8   PT/INR Recent Labs    05/04/24 0829  LABPROT 28.5*  INR 2.7*    STUDIES: DG Abdomen 1 View Result Date: 05/04/2024 CLINICAL DATA:  Constipation. EXAM: ABDOMEN - 1 VIEW COMPARISON:  February 01, 2018. FINDINGS: The bowel gas pattern is normal. No significant stool burden is noted. Phleboliths are noted in the pelvis. IMPRESSION: No abnormal bowel dilatation. Electronically Signed   By: Rosalene Colon M.D.   On: 05/04/2024 09:45      Impression /  Plan:   Assessment: Principal Problem:   UGIB (upper gastrointestinal bleed)   Amy [name] is a 89 y.o. y/o female with coffee-ground emesis that started yesterday.  The patient has also had a similar episode earlier this month and was evaluated by [facility].  At that time a upper endoscopy was not done due to family preference.  The family now wants to proceed with a upper endoscopy.  Plan:  The patient will be set up for an upper endoscopy for tomorrow.  The patients family has been explained the risks and benefits of the procedure including the risks of anesthesia.  They would like to proceed with the upper endoscopy.  Thank you for involving me in the care of this patient.      LOS: 0 days   Amy Sink, MD, MD. Sylvan Evener 05/04/2024, 4:41 PM,  Pager [phone] 7am-5pm  Check AMION for 5pm -7am coverage and on weekends   Note: This dictation was prepared with Dragon dictation along with smaller phrase technology. Any transcriptional errors that result from this process are unintentional.

## 2024-05-04 NOTE — Assessment & Plan Note (Signed)
 PPI ?

## 2024-05-04 NOTE — Plan of Care (Signed)

## 2024-05-04 NOTE — Assessment & Plan Note (Signed)
 Coffee-ground emesis over multiple days in the setting of Eliquis  use associated with recent diagnosis of PE and DVT Noted similar issue in the past with family refusing EGD secondary to concern for complication risk Hemoglobin 12.3 Will hold offending agent Family now agreeable to potential EGD Will consult Dr. Ole Berkeley for evaluation Trend hemoglobin Transfuse for hemoglobin less than 7 Monitor

## 2024-05-04 NOTE — ED Triage Notes (Signed)
 Pt arrives via [facility] from home for hematemesis. Pt reports starting eliquis  recently for PE. Caregiver described emesis as dark in color but not coffee ground. Pt reports no BM x3 days. Pt is on home oxygen 2lpm.

## 2024-05-04 NOTE — ED Notes (Signed)
 Pt with large BM. Mixture of formed stool and liquid stool. Brown to dark brown in color. Perineal care performed and clean incontinence brief placed.

## 2024-05-04 NOTE — ED Provider Notes (Signed)
 [facility] Provider Note    Event Date/Time   First MD Initiated Contact with Patient 05/04/24 0813     (approximate)   History   Constipation, vomiting   HPI  Amy [name] is a 89 y.o. female with recent complex admission per medical records, found to have PEs, DVTs, on heparin , some concern about hematemesis as well did not want invasive procedure so no endoscopy performed.  Has been doing well at home per caregiver except had some vomiting last night, nonbloody reportedly.  Primarily they are concerned because patient has not had a bowel movement and they are worried about impaction.   Physical Exam   Triage Vital Signs: ED Triage Vitals  Encounter Vitals Group     BP 05/04/24 0814 109/77     Systolic BP Percentile --      Diastolic BP Percentile --      Pulse Rate 05/04/24 0814 (!) 114     Resp 05/04/24 0814 18     Temp 05/04/24 0814 98.2 F (36.8 C)     Temp Source 05/04/24 0814 Oral     SpO2 05/04/24 0814 98 %     Weight 05/04/24 0812 65.5 kg (144 lb 6.4 oz)     Height 05/04/24 0812 1.651 m (5\' 5" )     Head Circumference --      Peak Flow --      Pain Score 05/04/24 0812 0     Pain Loc --      Pain Education --      Exclude from Growth Chart --     Most recent vital signs: Vitals:   05/04/24 1215 05/04/24 1235  BP:  (S) 115/77  Pulse: 100 (S) (!) 103  Resp:  (S) 16  Temp: 97.6 F (36.4 C) (S) 97.9 F (36.6 C)  SpO2: 99% (S) 100%     General: Awake, no distress.  CV:  Good peripheral perfusion.  Resp:  Normal effort.  Abd:  No distention.  Soft, nontender Other:     ED Results / Procedures / Treatments   Labs (all labs ordered are listed, but only abnormal results are displayed) Labs Reviewed  CBC - Abnormal; Notable for the following components:      Result Value   WBC 17.0 (*)    RDW 16.9 (*)    All other components within normal limits  COMPREHENSIVE METABOLIC PANEL WITH GFR - Abnormal; Notable for the  following components:   Glucose, Bld 126 (*)    BUN 28 (*)    Total Protein 5.7 (*)    Albumin 3.0 (*)    All other components within normal limits  APTT - Abnormal; Notable for the following components:   aPTT 43 (*)    All other components within normal limits  PROTIME-INR - Abnormal; Notable for the following components:   Prothrombin Time 28.5 (*)    INR 2.7 (*)    All other components within normal limits  TYPE AND SCREEN     EKG     RADIOLOGY KUB without evidence of fecal impaction    PROCEDURES:  Critical Care performed: yes  CRITICAL CARE Performed by: Bryson Carbine   Total critical care time: 30 minutes  Critical care time was exclusive of separately billable procedures and treating other patients.  Critical care was necessary to treat or prevent imminent or life-threatening deterioration.  Critical care was time spent personally by me on the following activities: development of treatment plan  with patient and/or surrogate as well as nursing, discussions with consultants, evaluation of patient's response to treatment, examination of patient, obtaining history from patient or surrogate, ordering and performing treatments and interventions, ordering and review of laboratory studies, ordering and review of radiographic studies, pulse oximetry and re-evaluation of patient's condition.   Procedures   MEDICATIONS ORDERED IN ED: Medications  ondansetron  (ZOFRAN ) injection 4 mg (4 mg Intravenous Given 05/04/24 1002)     IMPRESSION / MDM / ASSESSMENT AND PLAN / ED COURSE  I reviewed the triage vital signs and the nursing notes. Patient's presentation is most consistent with acute presentation with potential threat to life or bodily function.  Patient presents with reported constipation, possible impaction, however reports of vomiting last night which was "brown in color.  No vomiting since then.  Given recent concerns for possible hematemesis and vomiting last  night will obtain labs, IV, obtain KUB to assess for fecal impaction.  In the ED patient had episode of coffee-ground emesis, this is in the setting of anticoagulation for her multiple PEs.  Discussed with daughter who agrees that endoscopy could be beneficial.  Given this we will admit to the hospitalist team      FINAL CLINICAL IMPRESSION(S) / ED DIAGNOSES   Final diagnoses:  None     Rx / DC Orders   ED Discharge Orders     None        Note:  This document was prepared using Dragon voice recognition software and may include unintentional dictation errors.   Bryson Carbine, MD 05/04/24 1253

## 2024-05-04 NOTE — Assessment & Plan Note (Signed)
 BP stable Titrate home regimen

## 2024-05-05 ENCOUNTER — Other Ambulatory Visit: Payer: Self-pay

## 2024-05-05 ENCOUNTER — Encounter
Admission: EM | Disposition: A | Discharge status: Home / Self Care | Payer: Self-pay | Source: Home / Self Care | Attending: Emergency Medicine

## 2024-05-05 ENCOUNTER — Observation Stay: Admitting: Anesthesiology

## 2024-05-05 DIAGNOSIS — Z515 Encounter for palliative care: Secondary | ICD-10-CM | POA: Diagnosis not present

## 2024-05-05 DIAGNOSIS — K2100 Gastro-esophageal reflux disease with esophagitis, without bleeding: Secondary | ICD-10-CM | POA: Diagnosis not present

## 2024-05-05 DIAGNOSIS — K449 Diaphragmatic hernia without obstruction or gangrene: Secondary | ICD-10-CM

## 2024-05-05 DIAGNOSIS — K920 Hematemesis: Secondary | ICD-10-CM

## 2024-05-05 DIAGNOSIS — K209 Esophagitis, unspecified without bleeding: Secondary | ICD-10-CM | POA: Diagnosis not present

## 2024-05-05 DIAGNOSIS — K259 Gastric ulcer, unspecified as acute or chronic, without hemorrhage or perforation: Secondary | ICD-10-CM | POA: Diagnosis not present

## 2024-05-05 DIAGNOSIS — I10 Essential (primary) hypertension: Secondary | ICD-10-CM | POA: Diagnosis not present

## 2024-05-05 DIAGNOSIS — K922 Gastrointestinal hemorrhage, unspecified: Secondary | ICD-10-CM | POA: Diagnosis not present

## 2024-05-05 HISTORY — PX: ESOPHAGOGASTRODUODENOSCOPY: SHX5428

## 2024-05-05 LAB — CBC
HCT: 38 % (ref 36.0–46.0)
Hemoglobin: 11.6 g/dL — ABNORMAL LOW (ref 12.0–15.0)
MCH: 28.1 pg (ref 26.0–34.0)
MCHC: 30.5 g/dL (ref 30.0–36.0)
MCV: 92 fL (ref 80.0–100.0)
Platelets: 279 10*3/uL (ref 150–400)
RBC: 4.13 MIL/uL (ref 3.87–5.11)
RDW: 17 % — ABNORMAL HIGH (ref 11.5–15.5)
WBC: 12.3 10*3/uL — ABNORMAL HIGH (ref 4.0–10.5)
nRBC: 0 % (ref 0.0–0.2)

## 2024-05-05 LAB — COMPREHENSIVE METABOLIC PANEL WITH GFR
ALT: 14 U/L (ref 0–44)
AST: 16 U/L (ref 15–41)
Albumin: 2.9 g/dL — ABNORMAL LOW (ref 3.5–5.0)
Alkaline Phosphatase: 64 U/L (ref 38–126)
Anion gap: 8 (ref 5–15)
BUN: 22 mg/dL (ref 8–23)
CO2: 34 mmol/L — ABNORMAL HIGH (ref 22–32)
Calcium: 9 mg/dL (ref 8.9–10.3)
Chloride: 99 mmol/L (ref 98–111)
Creatinine, Ser: 0.62 mg/dL (ref 0.44–1.00)
GFR, Estimated: >60 mL/min (ref >60)
Glucose, Bld: 124 mg/dL — ABNORMAL HIGH (ref 70–99)
Potassium: 4.1 mmol/L (ref 3.5–5.1)
Sodium: 141 mmol/L (ref 135–145)
Total Bilirubin: 0.8 mg/dL (ref 0.0–1.2)
Total Protein: 5.7 g/dL — ABNORMAL LOW (ref 6.5–8.1)

## 2024-05-05 SURGERY — EGD (ESOPHAGOGASTRODUODENOSCOPY)
Anesthesia: General

## 2024-05-05 MED ORDER — ESMOLOL HCL 100 MG/10ML IV SOLN
INTRAVENOUS | Status: AC
  Filled 2024-05-05: qty 10

## 2024-05-05 MED ORDER — PANTOPRAZOLE SODIUM 40 MG PO TBEC
40.0000 mg | DELAYED_RELEASE_TABLET | Freq: Every day | ORAL | 2 refills | Status: AC
  Filled 2024-05-05: qty 90, 90d supply, fill #0

## 2024-05-05 MED ORDER — PROPOFOL 10 MG/ML IV BOLUS
INTRAVENOUS | Status: AC
  Filled 2024-05-05: qty 20

## 2024-05-05 MED ORDER — PROPOFOL 10 MG/ML IV BOLUS
INTRAVENOUS | Status: DC | PRN
  Administered 2024-05-05: 30 mg via INTRAVENOUS

## 2024-05-05 MED ORDER — LIDOCAINE HCL (PF) 2 % IJ SOLN
INTRAMUSCULAR | Status: AC
  Filled 2024-05-05: qty 5

## 2024-05-05 MED ORDER — APIXABAN 5 MG PO TABS: 5.0000 mg | ORAL_TABLET | Freq: Two times a day (BID) | ORAL | Status: DC

## 2024-05-05 MED ORDER — SODIUM CHLORIDE 0.9 % IV SOLN: INTRAVENOUS | Status: DC

## 2024-05-05 MED ORDER — LIDOCAINE HCL (CARDIAC) PF 100 MG/5ML IV SOSY
PREFILLED_SYRINGE | INTRAVENOUS | Status: DC | PRN
  Administered 2024-05-05: 80 mg via INTRAVENOUS

## 2024-05-05 MED ORDER — APIXABAN 5 MG PO TABS: 10.0000 mg | ORAL_TABLET | Freq: Two times a day (BID) | ORAL | Status: DC

## 2024-05-05 MED ORDER — ESMOLOL HCL 100 MG/10ML IV SOLN
INTRAVENOUS | Status: DC | PRN
Start: 2024-05-05 — End: 2024-05-05
  Administered 2024-05-05: 10 mg via INTRAVENOUS

## 2024-05-05 MED ORDER — APIXABAN 5 MG PO TABS: 5.0000 mg | ORAL_TABLET | Freq: Two times a day (BID) | ORAL | Status: AC

## 2024-05-05 NOTE — TOC Progression Note (Signed)
 Transition of Care [facility]) - Progression Note    Patient Details  Name: Amy [name] MRN: [id number] Date of Birth: [birth year]  [facility]) CM/SW Contact  Joslyn Nim, RN Phone Number: 05/05/2024, 3:59 PM  Clinical Narrative:    CM receive a message from MD stating patient is being d/c home today and need transportation.  CM called daughter , Adell Hones. She is aware of patient's d/c today. CM verified address. Patient live at [address] in [place], [place]. Daughter states that patient has  around the clock care givers.   A caregiver is currently at bedside.  Another caregiver will be at the home at 6pm.   CM called [facility].  They will transportation patient. He states that there are 3 people ahead of her  which will be about 2-3 hours. CM notified patient's nurse.         Expected Discharge Plan and Services         Expected Discharge Date: 05/05/24                                     Social Determinants of Health (SDOH) Interventions SDOH Screenings   Food Insecurity: No Food Insecurity (05/04/2024)  Housing: Low Risk  (05/04/2024)  Transportation Needs: No Transportation Needs (05/04/2024)  Utilities: Not At Risk (05/04/2024)  Social Connections: Socially Isolated (05/04/2024)  Tobacco Use: Low Risk  (05/04/2024)    Readmission Risk Interventions     No data to display

## 2024-05-05 NOTE — Care Management Obs Status (Signed)
 MEDICARE OBSERVATION STATUS NOTIFICATION   Patient Details  Name: Amy [name] MRN: [id number] Date of Birth: 23-Nov-1929   Medicare Observation Status Notification Given:  Yes    Anise Kerns 05/05/2024, 12:47 PM

## 2024-05-05 NOTE — Anesthesia Postprocedure Evaluation (Signed)
 Anesthesia Post Note  Patient: Amy [name]  Procedure(s) Performed: EGD (ESOPHAGOGASTRODUODENOSCOPY)  Patient location during evaluation: PACU Anesthesia Type: General Level of consciousness: awake and alert, oriented and patient cooperative Pain management: pain level controlled Vital Signs Assessment: post-procedure vital signs reviewed and stable Respiratory status: spontaneous breathing, nonlabored ventilation and respiratory function stable Cardiovascular status: blood pressure returned to baseline and stable Postop Assessment: adequate PO intake Anesthetic complications: no   No notable events documented.   Last Vitals:  Vitals:   05/05/24 1147 05/05/24 1156  BP: (!) 141/64 128/71  Pulse: 98 96  Resp: 17 15  Temp: 36.4 C   SpO2: 97% 97%    Last Pain:  Vitals:   05/05/24 1156  TempSrc:   PainSc: 0-No pain                 Dorothey Gate

## 2024-05-05 NOTE — Consult Note (Signed)
 Consultation Note Date: 05/05/2024 at 1400  Patient Name: Amy [name]  DOB: December 31, 1928  MRN: [id number]  Age / Sex: 89 y.o., female  PCP: Sari Cunning, MD Referring Physician: Aisha Hove, MD  HPI/Patient Profile: 89 y.o. female  with past medical history of osteoarthritis, depression, right lower extremity DVT (Eliquis ), GERD, HTN, dyslipidemia, plies Mase Thoma, CHF, and recent hospitalization for PE (5/6 - 5/8, placed on Eliquis  for PE, family declined diascopy evaluation at that time) admitted on 05/04/2024 with coffee-ground emesis.  Patient is being treated for upper GI bleed.  Upper endoscopy today revealed large hiatal hernia with small Cameron erosions and esophagitis.  GI recommendation is for Protonix  daily.  PMD was consulted to support patient and family with goals of care discussions..   Clinical Assessment and Goals of Care: Extensive chart review completed prior to meeting patient including labs, vital signs, imaging, progress notes, orders, and available advanced directive documents from current and previous encounters. I then met with patient and her granddaughter at bedside to discuss diagnosis prognosis, GOC, EOL wishes, disposition and options.  I introduced Palliative Medicine as specialized medical care for people living with serious illness. It focuses on providing relief from the symptoms and stress of a serious illness. The goal is to improve quality of life for both the patient and the family.  We discussed a brief life review of the patient.  Patient worked in [employer] the majority of her adult career.  She has daughters and several grandchildren that live nearby.  We discussed patient's current illness and what it means in the larger context of patient's on-going co-morbidities.  Reviewed patient came in with coffee-ground emesis, had upper endoscopy performed, showed  that it revealed hiatal hernia and some erosions.  Discussed GIs recommendation for Protonix  daily.  Patient shares understanding and repeatedly asked when she will be able to go home.  During my visit, I secure chatted with attending who confirmed patient will be discharged today.   I attempted to elicit values and goals of care important to the patient.  I highlighted to patient and granddaughter that the patient has made her wishes known in an advance directive.    Discussed that patient's next of kin decision makers are her daughters.  Additionally, outlined that patient would not want to receive resuscitative measures or be intubated should she experience a cardiopulmonary arrest or respiratory distress.    Despite multiple attempts to discuss boundaries with patient, she remained focused on wanting to know when she can be discharged from the hospital.  Space and opportunity provided for patient and granddaughter to ask questions and voiced their concerns.  Their main concern is knowing when patient will be discharged from the hospital.  DNR with limited interventions remains.  Patient's next of kin decision maker is clearly delineated in the advance directive available in ACP tab of epic.  No acute palliative needs at this time.  Plan is for patient to discharge home today.  Please reengage with PMD if goals change,  at patient/family's request, or if patient's health deteriorates during hospitalization.  Primary Decision Maker HCPOA  Physical Exam Constitutional:      General: She is not in acute distress.    Appearance: She is normal weight.  HENT:     Head: Normocephalic.     Ears:     Comments: HOH    Mouth/Throat:     Mouth: Mucous membranes are moist.  Eyes:     Pupils: Pupils are equal, round, and reactive to light.  Cardiovascular:     Rate and Rhythm: Normal rate.     Pulses: Normal pulses.  Pulmonary:     Effort: Pulmonary effort is normal.  Abdominal:      Palpations: Abdomen is soft.  Skin:    Comments: Patches of ecchymosis on bilateral UEs, no open wounds noted  Neurological:     Mental Status: She is alert and oriented to person, place, and time.  Psychiatric:        Mood and Affect: Mood normal.        Behavior: Behavior normal.        Thought Content: Thought content normal.        Judgment: Judgment normal.     Palliative Assessment/Data: 50%     Thank you for this consult. Palliative medicine will continue to follow and assist holistically.   Time Total: 75 minutes  Time spent includes: Detailed review of medical records (labs, imaging, vital signs), medically appropriate exam (mental status, respiratory, cardiac, skin), discussed with treatment team, counseling and educating patient, family and staff, documenting clinical information, medication management and coordination of care.  Signed by: Eller Gut, DNP, FNP-BC Palliative Medicine   Please contact Palliative Medicine Team providers via [facility] for questions and concerns.

## 2024-05-05 NOTE — Discharge Summary (Signed)
 Physician Discharge Summary   Patient: Amy [name] MRN: [id number] DOB: June 20, 1929  Admit date:     05/04/2024  Discharge date: 05/05/24  Discharge Physician: Aisha Hove   PCP: Sari Cunning, MD   Recommendations at discharge:    PCP follow up in 1 week.  Discharge Diagnoses: Principal Problem:   UGIB (upper gastrointestinal bleed) Active Problems:   Pulmonary embolism (HCC)   Hypertension   GERD with esophagitis   Hematemesis  Resolved Problems:   * No resolved hospital problems. *  Hospital Course: Amy [name] is a 89 y.o. female with medical history significant of osteoarthritis, depression, right leg DVT, GERD, hypertension, dyslipidemia and plasmacytoma presenting with upper GI bleeding.  Family reports multiple days of coffee-ground emesis.  Noted to have been recently admitted May 6 to May 8 for issues including PE/DVT.  Patient also had melanotic stools and bright red blood per rectum during that admission.  CT imaging was concerning for esophagitis/gastritis.  Gastroenterology was consulted.  However, family refused endoscopy/colonoscopy.  Was also started on Eliquis  for PE/DVT treatment.  Per report, family has been compliant with medication administration the patient.  No reported chest pain or shortness of breath.  No reported abdominal pain or diarrhea.  Noted generalized weakness chronically. Has had multiple episodes of coffee ground emesis at home. Admitted   Assessment and Plan: * UGIB (upper gastrointestinal bleed) Coffee-ground emesis over multiple days in the setting of Eliquis  use associated with recent diagnosis of PE and DVT Hemoglobin 12.3 remained stable. Eliquis  held. GI evaluated her, she got EGD which showed large hiatal hernia and some Cameron erosions with esophagitis. GI advised to change nexium to pantoprazole  40mg  daily. Advised to decreased Eliquis  dose to 5mg  bid, PCP follow up. Discussed with daughter over phone, she  understand and agree.  Pulmonary embolism (HCC) Recent diagnosis of pulmonary embolism and DVT on May 2025 admission Eliquis  dose decreased to 5mg  upon discharge. Advised to stop Eliquis  for any bleeding issues.   GERD with esophagitis Omeprazole changed to Pantoprazole  40mg  daily.  Hypertension Resume home regimen      Consultants: GI Procedures performed: Upper GI EGD.  Disposition: Home Diet recommendation:  Discharge Diet Orders (From admission, onward)     Start     Ordered   05/05/24 0000  Diet - low sodium heart healthy        05/05/24 1502           Cardiac diet DISCHARGE MEDICATION: Allergies as of 05/05/2024       Reactions   Doxycycline Hyclate Nausea Only   Levofloxacin  Nausea And Vomiting   Sulfa Antibiotics Rash   Other Reaction: Throat feels funny        Medication List     STOP taking these medications    esomeprazole 40 MG capsule Commonly known as: NEXIUM       TAKE these medications    acetaminophen  500 MG tablet Commonly known as: TYLENOL  Take 1,000 mg by mouth 3 (three) times daily.   Allergy Relief 10 MG tablet Generic drug: loratadine Take 10 mg by mouth daily.   apixaban  5 MG Tabs tablet Commonly known as: ELIQUIS  Take 1 tablet (5 mg total) by mouth 2 (two) times daily. Start taking on: May 06, 2024 What changed: Another medication with the same name was removed. Continue taking this medication, and follow the directions you see here.   Calmoseptine 0.44-20.6 % Oint Generic drug: Menthol-Zinc  Oxide Apply 1 Application topically in the  morning and at bedtime. With every shift apply moderate amount to sacral areas and groin   Cold & Hot Plus Menthol 4-1 % Ptch Generic drug: Lidocaine -Menthol Apply 1 patch topically daily.   diltiazem  120 MG 24 hr capsule Commonly known as: CARDIZEM  CD Take 120 mg by mouth daily.   DULoxetine  20 MG capsule Commonly known as: CYMBALTA  Take 20 mg by mouth at bedtime.   gabapentin   100 MG capsule Commonly known as: NEURONTIN  Take 2 capsules (200 mg total) by mouth at bedtime.   guaiFENesin -dextromethorphan 100-10 MG/5ML syrup Commonly known as: ROBITUSSIN DM Take 5 mLs by mouth every 4 (four) hours as needed for cough.   hydrOXYzine  25 MG tablet Commonly known as: ATARAX  Take 1 tablet (25 mg total) by mouth 3 (three) times daily as needed for anxiety.   lidocaine  5 % Commonly known as: LIDODERM  Place 1 patch onto the skin daily. Remove & Discard patch within 12 hours or as directed by MD   melatonin 5 MG Tabs Take 0.5 tablets (2.5 mg total) by mouth at bedtime.   ondansetron  4 MG tablet Commonly known as: ZOFRAN  Take 4 mg by mouth every 8 (eight) hours as needed for nausea or vomiting.   pantoprazole  40 MG tablet Commonly known as: Protonix  Take 1 tablet (40 mg total) by mouth daily.   polyethylene glycol 17 g packet Commonly known as: MIRALAX  / GLYCOLAX  Take 17 g by mouth daily.   PRESERVISION AREDS PO Take by mouth.   QUEtiapine  50 MG tablet Commonly known as: SEROQUEL  Take 50 mg by mouth at bedtime.   Theratears PF 0.25 % Soln Generic drug: Carboxymethylcellulose Sod PF Apply 1 drop to eye 4 (four) times daily.   torsemide  10 MG tablet Commonly known as: DEMADEX  Take 1 tablet (10 mg total) by mouth daily.        Discharge Exam: Filed Weights   05/04/24 0812  Weight: 65.5 kg      05/05/2024    4:34 PM 05/05/2024   12:35 PM 05/05/2024   12:16 PM  Vitals with BMI  Systolic 146 136 161  Diastolic 78 77 73  Pulse 112 97 99    General - Elderly Caucasian female, no apparent distress HEENT - PERRLA, EOMI, atraumatic head, non tender sinuses. Lung - Clear, no rales, rhonchi, wheezes. Heart - S1, S2 heard, no murmurs, rubs, trace pedal edema. Abdomen - Soft, non tender, bowel sounds good Neuro - Alert, awake and oriented, non focal exam. Skin - Warm and dry.  Condition at discharge: stable  The results of significant  diagnostics from this hospitalization (including imaging, microbiology, ancillary and laboratory) are listed below for reference.   Imaging Studies: DG Abdomen 1 View Result Date: 05/04/2024 CLINICAL DATA:  Constipation. EXAM: ABDOMEN - 1 VIEW COMPARISON:  February 01, 2018. FINDINGS: The bowel gas pattern is normal. No significant stool burden is noted. Phleboliths are noted in the pelvis. IMPRESSION: No abnormal bowel dilatation. Electronically Signed   By: Rosalene Colon M.D.   On: 05/04/2024 09:45   ECHOCARDIOGRAM COMPLETE Result Date: 04/29/2024    ECHOCARDIOGRAM REPORT   Patient Name:   CITLALI GAUTNEY [facility] Date of Exam: 04/28/2024 Medical Rec #:  [id number]         Height:       65.0 in Accession #:    [id number]        Weight:       144.4 lb Date of Birth:  October 31, 1929  BSA:          1.722 m Patient Age:    89 years          BP:           135/70 mmHg Patient Gender: F                 HR:           83 bpm. Exam Location:  [facility] Procedure: 2D Echo, Cardiac Doppler and Color Doppler (Both Spectral and Color            Flow Doppler were utilized during procedure). Indications:     I26.09 Pulmonary Embolus  History:         Patient has prior history of Echocardiogram examinations, most                  recent 09/15/2023. Risk Factors:Dyslipidemia and Hypertension.  Sonographer:     Brigid Canada RDCS Referring Phys:  [id number] JAN A MANSY Diagnosing Phys: Lida Reeks Alluri IMPRESSIONS  1. Left ventricular ejection fraction, by estimation, is 55 to 60%. The left ventricle has normal function. The left ventricle has no regional wall motion abnormalities. Left ventricular diastolic parameters are consistent with Grade I diastolic dysfunction (impaired relaxation).  2. Right ventricular systolic function is normal. The right ventricular size is normal.  3. The mitral valve is normal in structure. Mild mitral valve regurgitation.  4. The aortic valve is tricuspid. There is mild thickening of the aortic valve.  Aortic valve regurgitation is mild. FINDINGS  Left Ventricle: Left ventricular ejection fraction, by estimation, is 55 to 60%. The left ventricle has normal function. The left ventricle has no regional wall motion abnormalities. The left ventricular internal cavity size was normal in size. There is  no left ventricular hypertrophy. Left ventricular diastolic parameters are consistent with Grade I diastolic dysfunction (impaired relaxation). Right Ventricle: The right ventricular size is normal. No increase in right ventricular wall thickness. Right ventricular systolic function is normal. Left Atrium: Left atrial size was normal in size. Right Atrium: Right atrial size was normal in size. Pericardium: There is no evidence of pericardial effusion. Mitral Valve: The mitral valve is normal in structure. Mild mitral valve regurgitation. Tricuspid Valve: The tricuspid valve is normal in structure. Tricuspid valve regurgitation is trivial. Aortic Valve: The aortic valve is tricuspid. There is mild thickening of the aortic valve. Aortic valve regurgitation is mild. Pulmonic Valve: The pulmonic valve was not well visualized. Pulmonic valve regurgitation is not visualized. Aorta: The aortic root and ascending aorta are structurally normal, with no evidence of dilitation. IAS/Shunts: The atrial septum is grossly normal.  LEFT VENTRICLE PLAX 2D LVIDd:         3.40 cm   Diastology LVIDs:         2.20 cm   LV e' medial:    4.24 cm/s LV PW:         0.80 cm   LV E/e' medial:  13.3 LV IVS:        0.90 cm   LV e' lateral:   7.18 cm/s LVOT diam:     1.90 cm   LV E/e' lateral: 7.9 LV SV:         32 LV SV Index:   19 LVOT Area:     2.84 cm  RIGHT VENTRICLE RV Basal diam:  3.40 cm RV S prime:     13.20 cm/s TAPSE (M-mode): 2.0 cm LEFT ATRIUM  Index        RIGHT ATRIUM           Index LA diam:      4.10 cm 2.38 cm/m   RA Area:     12.40 cm LA Vol (A2C): 19.4 ml 11.26 ml/m  RA Volume:   34.80 ml  20.20 ml/m LA Vol (A4C):  46.3 ml 26.88 ml/m  AORTIC VALVE LVOT Vmax:   66.90 cm/s LVOT Vmean:  44.033 cm/s LVOT VTI:    0.113 m  AORTA Ao Root diam: 2.90 cm Ao Asc diam:  3.30 cm MITRAL VALVE                TRICUSPID VALVE MV Area (PHT): 3.88 cm     TR Peak grad:   27.5 mmHg MV Decel Time: 196 msec     TR Vmax:        262.00 cm/s MV E velocity: 56.60 cm/s MV A velocity: 103.50 cm/s  SHUNTS MV E/A ratio:  0.55         Systemic VTI:  0.11 m                             Systemic Diam: 1.90 cm Joetta Mustache Electronically signed by Joetta Mustache Signature Date/Time: 04/29/2024/11:09:04 AM    Final    US  Venous Img Lower Bilateral (DVT) Result Date: 04/28/2024 CLINICAL DATA:  Pulmonary embolism EXAM: BILATERAL LOWER EXTREMITY VENOUS DOPPLER ULTRASOUND TECHNIQUE: Gray-scale sonography with compression, as well as color and duplex ultrasound, were performed to evaluate the deep venous system(s) from the level of the common femoral vein through the popliteal and proximal calf veins. COMPARISON:  04/18/2021 FINDINGS: On the right: Occlusive thrombus in the common femoral vein continuing into the upper great saphenous vein. Nonocclusive thrombus seen throughout the remaining deep veins of the right lower extremity including the profunda, femoral vein, popliteal, and upper posterior tibial/peroneal veins. On the left: Normal compressibility of the common femoral, superficial femoral, and popliteal veins, as well as the visualized calf veins. Visualized portions of profunda femoral vein and great saphenous vein unremarkable. No filling defects to suggest DVT on grayscale or color Doppler imaging. Doppler waveforms show normal direction of venous flow, normal respiratory plasticity and response to augmentation. IMPRESSION: Extensive DVT throughout the right lower extremity veins, occlusive at the level of the common femoral vein. Negative for DVT in the left lower extremity. Electronically Signed   By: Ronnette Coke M.D.   On: 04/28/2024 05:34    CT ABDOMEN PELVIS W CONTRAST Addendum Date: 04/27/2024 ADDENDUM REPORT: 04/27/2024 19:20 ADDENDUM: These results were called by telephone at the time of interpretation on 04/27/2024 at 7:20 pm to provider NEHA RAY , who verbally acknowledged these results. Electronically Signed   By: Tyron Gallon M.D.   On: 04/27/2024 19:20   Result Date: 04/27/2024 CLINICAL DATA:  Acute abdominal pain EXAM: CT ABDOMEN AND PELVIS WITH CONTRAST TECHNIQUE: Multidetector CT imaging of the abdomen and pelvis was performed using the standard protocol following bolus administration of intravenous contrast. RADIATION DOSE REDUCTION: This exam was performed according to the departmental dose-optimization program which includes automated exposure control, adjustment of the mA and/or kV according to patient size and/or use of iterative reconstruction technique. CONTRAST:  OMNIPAQUE  IOHEXOL  300 MG/ML  SOLN COMPARISON:  CT PE protocol 10/03/2023. CT abdomen and pelvis 09/08/2023. FINDINGS: Lower chest: There is atelectasis in the lung bases with trace bilateral pleural effusions.  Pulmonary emboli are seen within the distal right main pulmonary artery extending into segmental branches of the right lower lobe. Hepatobiliary: The gallbladder surgically absent. There is intra and extrahepatic biliary ductal dilatation with common bowel duct measuring up to 15 mm. This appears unchanged from prior. No focal liver lesions are identified. Pancreas: Unremarkable. No pancreatic ductal dilatation or surrounding inflammatory changes. Spleen: Normal in size without focal abnormality. Adrenals/Urinary Tract: Bilateral renal cysts are again noted measuring up to 2 cm. There is no hydronephrosis or perinephric stranding. The adrenal glands and bladder are within normal limits. Stomach/Bowel: There is a large hiatal hernia containing the proximal stomach. There is fluid distension of the distal esophagus and wall thickening at the gastroesophageal  junction. The intra-abdominal portion of the stomach appears normal. The appendix is not seen. There is sigmoid colon diverticulosis. No focal inflammatory change or pneumatosis. No free air. Vascular/Lymphatic: Aortic atherosclerosis. No enlarged abdominal or pelvic lymph nodes. Reproductive: Status post hysterectomy. No adnexal masses. Other: No abdominal wall hernia or abnormality. No abdominopelvic ascites. Musculoskeletal: T11 and T12 vertebral plasty changes are present. The bones are diffusely osteopenic. Degenerative changes also affect the hips. IMPRESSION: 1. Pulmonary emboli within the distal right main pulmonary artery extending into segmental branches of the right lower lobe. 2. Large hiatal hernia containing the proximal stomach. There is fluid distension of the distal esophagus and wall thickening at the gastroesophageal junction. Correlate clinically for esophagitis/gastritis. Underlying lesion not excluded. 3. Stable intra and extrahepatic biliary ductal dilatation status post cholecystectomy. 4. Sigmoid colon diverticulosis. 5. Aortic atherosclerosis. Aortic Atherosclerosis (ICD10-I70.0). Electronically Signed: By: Tyron Gallon M.D. On: 04/27/2024 19:15    Microbiology: Results for orders placed or performed during the hospital encounter of 10/03/23  Resp panel by RT-PCR (RSV, Flu A&B, Covid) Anterior Nasal Swab     Status: None   Collection Time: 10/03/23  8:57 AM   Specimen: Anterior Nasal Swab  Result Value Ref Range Status   SARS Coronavirus 2 by RT PCR NEGATIVE NEGATIVE Final    Comment: (NOTE) SARS-CoV-2 target nucleic acids are NOT DETECTED.  The SARS-CoV-2 RNA is generally detectable in upper respiratory specimens during the acute phase of infection. The lowest concentration of SARS-CoV-2 viral copies this assay can detect is 138 copies/mL. A negative result does not preclude SARS-Cov-2 infection and should not be used as the sole basis for treatment or other patient  management decisions. A negative result may occur with  improper specimen collection/handling, submission of specimen other than nasopharyngeal swab, presence of viral mutation(s) within the areas targeted by this assay, and inadequate number of viral copies(<138 copies/mL). A negative result must be combined with clinical observations, patient history, and epidemiological information. The expected result is Negative.  Fact Sheet for Patients:  BloggerCourse.com  Fact Sheet for Healthcare Providers:  SeriousBroker.it  This test is no t yet approved or cleared by the [place]  FDA and  has been authorized for detection and/or diagnosis of SARS-CoV-2 by FDA under an Emergency Use Authorization (EUA). This EUA will remain  in effect (meaning this test can be used) for the duration of the COVID-19 declaration under Section 564(b)(1) of the Act, 21 U.S.C.section 360bbb-3(b)(1), unless the authorization is terminated  or revoked sooner.       Influenza A by PCR NEGATIVE NEGATIVE Final   Influenza B by PCR NEGATIVE NEGATIVE Final    Comment: (NOTE) The Xpert Xpress SARS-CoV-2/FLU/RSV plus assay is intended as an aid in the diagnosis of influenza from  Nasopharyngeal swab specimens and should not be used as a sole basis for treatment. Nasal washings and aspirates are unacceptable for Xpert Xpress SARS-CoV-2/FLU/RSV testing.  Fact Sheet for Patients: BloggerCourse.com  Fact Sheet for Healthcare Providers: SeriousBroker.it  This test is not yet approved or cleared by the [place]  FDA and has been authorized for detection and/or diagnosis of SARS-CoV-2 by FDA under an Emergency Use Authorization (EUA). This EUA will remain in effect (meaning this test can be used) for the duration of the COVID-19 declaration under Section 564(b)(1) of the Act, 21 U.S.C. section 360bbb-3(b)(1),  unless the authorization is terminated or revoked.     Resp Syncytial Virus by PCR NEGATIVE NEGATIVE Final    Comment: (NOTE) Fact Sheet for Patients: BloggerCourse.com  Fact Sheet for Healthcare Providers: SeriousBroker.it  This test is not yet approved or cleared by the [place]  FDA and has been authorized for detection and/or diagnosis of SARS-CoV-2 by FDA under an Emergency Use Authorization (EUA). This EUA will remain in effect (meaning this test can be used) for the duration of the COVID-19 declaration under Section 564(b)(1) of the Act, 21 U.S.C. section 360bbb-3(b)(1), unless the authorization is terminated or revoked.  Performed at [facility], [address] [place], [place] [address]     Labs: CBC: Recent Labs  Lab 04/29/24 0600 05/04/24 0829 05/04/24 1720 05/04/24 2240 05/05/24 0505  WBC 8.3 17.0*  --   --  12.3*  NEUTROABS 5.9  --   --   --   --   HGB 11.5* 12.3 12.4 11.4* 11.6*  HCT 38.0 39.2 39.7 36.1 38.0  MCV 90.5 90.1  --   --  92.0  PLT 197 286  --   --  279   Basic Metabolic Panel: Recent Labs  Lab 04/29/24 0600 05/04/24 0829 05/05/24 0505  NA 142 143 141  K 5.0 4.1 4.1  CL 105 100 99  CO2 27 30 34*  GLUCOSE 122* 126* 124*  BUN 30* 28* 22  CREATININE 0.86 0.66 0.62  CALCIUM  9.0 9.2 9.0   Liver Function Tests: Recent Labs  Lab 05/04/24 0829 05/05/24 0505  AST 21 16  ALT 14 14  ALKPHOS 64 64  BILITOT 0.8 0.8  PROT 5.7* 5.7*  ALBUMIN 3.0* 2.9*   CBG: No results for input(s): "GLUCAP" in the last 168 hours.  Discharge time spent: 35 minutes.  Signed: Aisha Hove, MD [facility] 05/05/2024

## 2024-05-05 NOTE — Plan of Care (Signed)

## 2024-05-05 NOTE — Progress Notes (Signed)
 The patient underwent an upper endoscopy with a large hiatal hernia and some Cameron erosions with esophagitis found.  Patient's daughter states that the Nexium has not been helping her and the patient is recommended to start Protonix  and take it on a daily basis upon discharge.  Nothing further to do from a GI point of view.  I will sign off.  Please call if any further GI concerns or questions.  We would like to thank you for the opportunity to participate in the care of Amy [name].

## 2024-05-05 NOTE — Anesthesia Preprocedure Evaluation (Addendum)
 Anesthesia Evaluation  Patient identified by MRN, date of birth, ID band Patient awake    Reviewed: Allergy & Precautions, NPO status , Patient's Chart, lab work & pertinent test results  History of Anesthesia Complications Negative for: history of anesthetic complications  Airway Mallampati: IV   Neck ROM: Full    Dental  (+) Edentulous Upper, Edentulous Lower   Pulmonary  Interstitial lung disease   Pulmonary exam normal breath sounds clear to auscultation       Cardiovascular hypertension, +CHF  Normal cardiovascular exam+ Valvular Problems/Murmurs (mild MR and AR) MVP  Rhythm:Regular Rate:Normal  Hx DVT on Eliquis    Neuro/Psych  PSYCHIATRIC DISORDERS  Depression    negative neurological ROS     GI/Hepatic hiatal hernia,GERD  ,,  Endo/Other  negative endocrine ROS    Renal/GU      Musculoskeletal  (+) Arthritis ,    Abdominal   Peds  Hematology negative hematology ROS (+)   Anesthesia Other Findings Cardiology note 12/11/23:  Cardiovascular Problem List  -chronic HFpEF, NYHA III -HTN -coronary Ca++ seen on CT -aortic atherosclerosis -MVP noted on previous echo -mild MR noted on previous echo -mild AR noted on previous echo -dilated left atrium on echo -hyperlipidemia -current long term use of ASA -diastolic dysfunction  Assessment and Plan  -chronic illness that poses a threat to life or bodily function: chronic HFpEF, several recent hospitalizations - nonelevated filling pressures on my exam today, continue torsemide  10 mg po daily, can take an extra PM dose PRN leg swelling or worsening SOB -continue diltiazem  as well; metoprolol  has been stopped 2/2 risk of hypotension -history largely obtained from pt's daughter as well as her in-home caregiver Mallory -tests and/or records independently reviewed by me: clinician notes, imaging and lab results from hospitalization at [facility] 10/11-10/14/2024, echo  images 09/15/23 and echo report 07/2016 -drug therapy requiring monitoring: ASA, no si/sx of bleeding at present, though she is certainly at elevated risk given adv age, frailty -code status discussed at today's visit: DNR/I, confirmed during recent hospitalization and reaffirmed during today's visit  -f/u six months    Reproductive/Obstetrics                             Anesthesia Physical Anesthesia Plan  ASA: 3  Anesthesia Plan: General   Post-op Pain Management:    Induction: Intravenous  PONV Risk Score and Plan: 3 and Propofol  infusion, TIVA and Treatment may vary due to age or medical condition  Airway Management Planned: Natural Airway  Additional Equipment:   Intra-op Plan:   Post-operative Plan:   Informed Consent: I have reviewed the patients History and Physical, chart, labs and discussed the procedure including the risks, benefits and alternatives for the proposed anesthesia with the patient or authorized representative who has indicated his/her understanding and acceptance.   Patient has DNR.  Discussed DNR with power of attorney and Suspend DNR.   Consent reviewed with POA  Plan Discussed with: CRNA  Anesthesia Plan Comments: (History and consent obtained from patient's daughter at bedside. LMA/GETA backup discussed.  Patient's daughter consented for risks of anesthesia including but not limited to:  - adverse reactions to medications - damage to eyes, teeth, lips or other oral mucosa - nerve damage due to positioning  - sore throat or hoarseness - damage to heart, brain, nerves, lungs, other parts of body or loss of life  Informed patient's daughter about role of CRNA in peri- and  intra-operative care; she voiced understanding.)        Anesthesia Quick Evaluation

## 2024-05-05 NOTE — Transfer of Care (Signed)
 Immediate Anesthesia Transfer of Care Note  Patient: Amy [name]  Procedure(s) Performed: EGD (ESOPHAGOGASTRODUODENOSCOPY)  Patient Location: PACU and Endoscopy Unit  Anesthesia Type:General  Level of Consciousness: awake and patient cooperative  Airway & Oxygen Therapy: Patient Spontanous Breathing  Post-op Assessment: Report given to RN and Post -op Vital signs reviewed and stable  Post vital signs: Reviewed and stable  Last Vitals:  Vitals Value Taken Time  BP 141/64 05/05/24 1147  Temp    Pulse 95   Resp 16 05/05/24 1148  SpO2 98   Vitals shown include unfiled device data.  Last Pain:  Vitals:   05/05/24 1114  TempSrc: Temporal  PainSc:          Complications: No notable events documented.

## 2024-05-05 NOTE — Op Note (Signed)
 [facility] Gastroenterology Patient Name: Amy [name] Procedure Date: 05/05/2024 11:23 AM MRN: [id number] Account #: [id number] Date of Birth: October 05, 1929 Admit Type: Inpatient Age: 89 Room: [facility] ENDO ROOM 4 Gender: Female Note Status: Finalized Instrument Name: Upper Endoscope [id number] Procedure:             Upper GI endoscopy Indications:           Hematemesis Providers:             [name] MD, MD Referring MD:          Sari Cunning, MD (Referring MD) Medicines:             Propofol  per Anesthesia Complications:         No immediate complications. Procedure:             Pre-Anesthesia Assessment:                        - Prior to the procedure, a History and Physical was                         performed, and patient medications and allergies were                         reviewed. The patient's tolerance of previous                         anesthesia was also reviewed. The risks and benefits                         of the procedure and the sedation options and risks                         were discussed with the patient. All questions were                         answered, and informed consent was obtained. Prior                         Anticoagulants: The patient has taken no anticoagulant                         or antiplatelet agents. ASA Grade Assessment: II - A                         patient with mild systemic disease. After reviewing                         the risks and benefits, the patient was deemed in                         satisfactory condition to undergo the procedure.                        After obtaining informed consent, the endoscope was                         passed under direct vision. Throughout the procedure,  the patient's blood pressure, pulse, and oxygen                         saturations were monitored continuously. The Endoscope                         was introduced through the mouth, and advanced to  the                         second part of duodenum. The upper GI endoscopy was                         accomplished without difficulty. The patient tolerated                         the procedure well. Findings:      A large hiatal hernia was present.      LA Grade B (one or more mucosal breaks greater than 5 mm, not extending       between the tops of two mucosal folds) esophagitis with no bleeding was       found in the lower third of the esophagus.      A large hiatal hernia with multiple Cameron ulcers was found.      The examined duodenum was normal. Impression:            - Large hiatal hernia.                        - LA Grade B esophagitis with no bleeding.                        - Large hiatal hernia with multiple Cameron ulcers.                        - Normal examined duodenum.                        - No specimens collected. Recommendation:        - Return patient to hospital ward for ongoing care.                        - Resume previous diet.                        - Continue present medications.                        - Use a proton pump inhibitor PO daily. Procedure Code(s):     --- Professional ---                        [phone], Esophagogastroduodenoscopy, flexible,                         transoral; diagnostic, including collection of                         specimen(s) by brushing or washing, when performed                         (  separate procedure) Diagnosis Code(s):     --- Professional ---                        K92.0, Hematemesis                        K25.9, Gastric ulcer, unspecified as acute or chronic,                         without hemorrhage or perforation                        K20.90, Esophagitis, unspecified without bleeding CPT copyright 2022 [employer]. All rights reserved. The codes documented in this report are preliminary and upon coder review may  be revised to meet current compliance requirements. [name] MD,  MD 05/05/2024 11:44:11 AM This report has been signed electronically. Number of Addenda: 0 Note Initiated On: 05/05/2024 11:23 AM Estimated Blood Loss:  Estimated blood loss: none.      [facility]

## 2024-05-06 ENCOUNTER — Encounter: Payer: Self-pay | Admitting: Gastroenterology

## 2024-06-08 ENCOUNTER — Encounter (INDEPENDENT_AMBULATORY_CARE_PROVIDER_SITE_OTHER): Admitting: Ophthalmology
